# Patient Record
Sex: Female | Born: 1970 | Race: Black or African American | Hispanic: No | Marital: Single | Smoking: Never smoker
Health system: Southern US, Community
[De-identification: ages and names within clinical notes are randomized; demographics above are authoritative.]

## PROBLEM LIST (undated history)

## (undated) ENCOUNTER — Ambulatory Visit (HOSPITAL_COMMUNITY): Admission: EM | Payer: 59 | Source: Home / Self Care

## (undated) DIAGNOSIS — G8929 Other chronic pain: Secondary | ICD-10-CM

## (undated) DIAGNOSIS — M47812 Spondylosis without myelopathy or radiculopathy, cervical region: Secondary | ICD-10-CM

## (undated) DIAGNOSIS — H9192 Unspecified hearing loss, left ear: Secondary | ICD-10-CM

## (undated) DIAGNOSIS — L709 Acne, unspecified: Secondary | ICD-10-CM

## (undated) DIAGNOSIS — E785 Hyperlipidemia, unspecified: Secondary | ICD-10-CM

## (undated) DIAGNOSIS — Z0271 Encounter for disability determination: Secondary | ICD-10-CM

## (undated) DIAGNOSIS — R51 Headache: Secondary | ICD-10-CM

## (undated) DIAGNOSIS — M542 Cervicalgia: Secondary | ICD-10-CM

## (undated) DIAGNOSIS — K1121 Acute sialoadenitis: Secondary | ICD-10-CM

## (undated) DIAGNOSIS — I10 Essential (primary) hypertension: Secondary | ICD-10-CM

## (undated) DIAGNOSIS — M5030 Other cervical disc degeneration, unspecified cervical region: Secondary | ICD-10-CM

## (undated) DIAGNOSIS — I82409 Acute embolism and thrombosis of unspecified deep veins of unspecified lower extremity: Secondary | ICD-10-CM

## (undated) DIAGNOSIS — M2669 Other specified disorders of temporomandibular joint: Secondary | ICD-10-CM

## (undated) DIAGNOSIS — M1990 Unspecified osteoarthritis, unspecified site: Secondary | ICD-10-CM

## (undated) DIAGNOSIS — M47816 Spondylosis without myelopathy or radiculopathy, lumbar region: Secondary | ICD-10-CM

## (undated) DIAGNOSIS — F329 Major depressive disorder, single episode, unspecified: Secondary | ICD-10-CM

## (undated) DIAGNOSIS — T7840XA Allergy, unspecified, initial encounter: Secondary | ICD-10-CM

## (undated) DIAGNOSIS — R112 Nausea with vomiting, unspecified: Secondary | ICD-10-CM

## (undated) DIAGNOSIS — M5412 Radiculopathy, cervical region: Secondary | ICD-10-CM

## (undated) DIAGNOSIS — E039 Hypothyroidism, unspecified: Secondary | ICD-10-CM

## (undated) DIAGNOSIS — M5136 Other intervertebral disc degeneration, lumbar region: Secondary | ICD-10-CM

## (undated) DIAGNOSIS — M899 Disorder of bone, unspecified: Secondary | ICD-10-CM

## (undated) DIAGNOSIS — M961 Postlaminectomy syndrome, not elsewhere classified: Secondary | ICD-10-CM

## (undated) DIAGNOSIS — F419 Anxiety disorder, unspecified: Secondary | ICD-10-CM

## (undated) DIAGNOSIS — Z9889 Other specified postprocedural states: Secondary | ICD-10-CM

## (undated) DIAGNOSIS — M5416 Radiculopathy, lumbar region: Secondary | ICD-10-CM

## (undated) DIAGNOSIS — R519 Headache, unspecified: Secondary | ICD-10-CM

## (undated) HISTORY — DX: Spondylosis without myelopathy or radiculopathy, cervical region: M47.812

## (undated) HISTORY — DX: Unspecified hearing loss, left ear: H91.92

## (undated) HISTORY — DX: Disorder of bone, unspecified: M89.9

## (undated) HISTORY — DX: Allergy, unspecified, initial encounter: T78.40XA

## (undated) HISTORY — DX: Postlaminectomy syndrome, not elsewhere classified: M96.1

## (undated) HISTORY — DX: Cervicalgia: M54.2

## (undated) HISTORY — DX: Other intervertebral disc degeneration, lumbar region: M51.36

## (undated) HISTORY — DX: Anxiety disorder, unspecified: F41.9

## (undated) HISTORY — DX: Encounter for disability determination: Z02.71

## (undated) HISTORY — DX: Acne, unspecified: L70.9

## (undated) HISTORY — DX: Other cervical disc degeneration, unspecified cervical region: M50.30

## (undated) HISTORY — DX: Acute embolism and thrombosis of unspecified deep veins of unspecified lower extremity: I82.409

## (undated) HISTORY — DX: Headache: R51

## (undated) HISTORY — PX: BACK SURGERY: SHX140

## (undated) HISTORY — PX: OTHER SURGICAL HISTORY: SHX169

## (undated) HISTORY — DX: Hyperlipidemia, unspecified: E78.5

## (undated) HISTORY — DX: Headache, unspecified: R51.9

## (undated) HISTORY — DX: Other chronic pain: G89.29

## (undated) HISTORY — DX: Other specified disorders of temporomandibular joint: M26.69

## (undated) HISTORY — DX: Radiculopathy, lumbar region: M54.16

## (undated) HISTORY — DX: Spondylosis without myelopathy or radiculopathy, lumbar region: M47.816

## (undated) HISTORY — DX: Radiculopathy, cervical region: M54.12

## (undated) HISTORY — DX: Acute sialoadenitis: K11.21

---

## 2000-12-17 ENCOUNTER — Encounter: Payer: Self-pay | Admitting: Emergency Medicine

## 2000-12-17 ENCOUNTER — Inpatient Hospital Stay (HOSPITAL_COMMUNITY): Admission: AD | Admit: 2000-12-17 | Discharge: 2000-12-17 | Payer: Self-pay | Admitting: Gynecology

## 2000-12-28 ENCOUNTER — Inpatient Hospital Stay (HOSPITAL_COMMUNITY): Admission: AD | Admit: 2000-12-28 | Discharge: 2000-12-28 | Payer: Self-pay | Admitting: Obstetrics & Gynecology

## 2001-01-12 ENCOUNTER — Encounter: Admission: RE | Admit: 2001-01-12 | Discharge: 2001-01-12 | Payer: Self-pay | Admitting: Family Medicine

## 2001-01-26 ENCOUNTER — Inpatient Hospital Stay (HOSPITAL_COMMUNITY): Admission: AD | Admit: 2001-01-26 | Discharge: 2001-01-26 | Payer: Self-pay | Admitting: Obstetrics & Gynecology

## 2001-01-31 ENCOUNTER — Other Ambulatory Visit: Admission: RE | Admit: 2001-01-31 | Discharge: 2001-01-31 | Payer: Self-pay | Admitting: Family Medicine

## 2001-01-31 ENCOUNTER — Encounter: Admission: RE | Admit: 2001-01-31 | Discharge: 2001-01-31 | Payer: Self-pay | Admitting: Family Medicine

## 2001-02-08 ENCOUNTER — Ambulatory Visit (HOSPITAL_COMMUNITY): Admission: RE | Admit: 2001-02-08 | Discharge: 2001-02-22 | Payer: Self-pay | Admitting: Obstetrics

## 2001-03-05 ENCOUNTER — Encounter: Admission: RE | Admit: 2001-03-05 | Discharge: 2001-03-05 | Payer: Self-pay | Admitting: Family Medicine

## 2001-03-28 ENCOUNTER — Encounter: Admission: RE | Admit: 2001-03-28 | Discharge: 2001-03-28 | Payer: Self-pay | Admitting: Family Medicine

## 2001-04-16 ENCOUNTER — Encounter: Admission: RE | Admit: 2001-04-16 | Discharge: 2001-04-16 | Payer: Self-pay | Admitting: Family Medicine

## 2001-05-10 ENCOUNTER — Encounter: Admission: RE | Admit: 2001-05-10 | Discharge: 2001-05-10 | Payer: Self-pay | Admitting: Family Medicine

## 2001-05-15 ENCOUNTER — Ambulatory Visit (HOSPITAL_COMMUNITY): Admission: RE | Admit: 2001-05-15 | Discharge: 2001-05-18 | Payer: Self-pay | Admitting: Family Medicine

## 2001-05-18 ENCOUNTER — Encounter: Payer: Self-pay | Admitting: Family Medicine

## 2001-05-28 ENCOUNTER — Encounter: Admission: RE | Admit: 2001-05-28 | Discharge: 2001-05-28 | Payer: Self-pay | Admitting: Family Medicine

## 2001-06-15 ENCOUNTER — Encounter: Admission: RE | Admit: 2001-06-15 | Discharge: 2001-06-15 | Payer: Self-pay | Admitting: Family Medicine

## 2001-06-29 ENCOUNTER — Encounter: Admission: RE | Admit: 2001-06-29 | Discharge: 2001-06-29 | Payer: Self-pay | Admitting: Family Medicine

## 2001-07-13 ENCOUNTER — Encounter: Admission: RE | Admit: 2001-07-13 | Discharge: 2001-07-13 | Payer: Self-pay | Admitting: Family Medicine

## 2001-07-18 ENCOUNTER — Encounter: Admission: RE | Admit: 2001-07-18 | Discharge: 2001-07-18 | Payer: Self-pay | Admitting: Family Medicine

## 2001-07-23 ENCOUNTER — Observation Stay (HOSPITAL_COMMUNITY): Admission: AD | Admit: 2001-07-23 | Discharge: 2001-07-24 | Payer: Self-pay | Admitting: Obstetrics & Gynecology

## 2001-07-24 ENCOUNTER — Encounter: Payer: Self-pay | Admitting: Obstetrics & Gynecology

## 2001-07-27 ENCOUNTER — Encounter: Admission: RE | Admit: 2001-07-27 | Discharge: 2001-07-27 | Payer: Self-pay | Admitting: Family Medicine

## 2001-07-27 ENCOUNTER — Encounter (HOSPITAL_COMMUNITY): Admission: RE | Admit: 2001-07-27 | Discharge: 2001-07-27 | Payer: Self-pay | Admitting: Obstetrics & Gynecology

## 2001-07-31 ENCOUNTER — Inpatient Hospital Stay (HOSPITAL_COMMUNITY): Admission: AD | Admit: 2001-07-31 | Discharge: 2001-08-03 | Payer: Self-pay | Admitting: *Deleted

## 2001-08-14 ENCOUNTER — Encounter: Admission: RE | Admit: 2001-08-14 | Discharge: 2001-08-14 | Payer: Self-pay | Admitting: Family Medicine

## 2001-08-21 ENCOUNTER — Encounter: Admission: RE | Admit: 2001-08-21 | Discharge: 2001-08-21 | Payer: Self-pay | Admitting: Family Medicine

## 2001-09-21 ENCOUNTER — Encounter: Admission: RE | Admit: 2001-09-21 | Discharge: 2001-09-21 | Payer: Self-pay | Admitting: Family Medicine

## 2001-10-03 ENCOUNTER — Encounter: Admission: RE | Admit: 2001-10-03 | Discharge: 2001-10-03 | Payer: Self-pay | Admitting: Family Medicine

## 2002-04-27 ENCOUNTER — Emergency Department (HOSPITAL_COMMUNITY): Admission: EM | Admit: 2002-04-27 | Discharge: 2002-04-27 | Payer: Self-pay | Admitting: Emergency Medicine

## 2002-09-20 ENCOUNTER — Encounter: Admission: RE | Admit: 2002-09-20 | Discharge: 2002-09-20 | Payer: Self-pay | Admitting: Family Medicine

## 2003-01-08 ENCOUNTER — Emergency Department (HOSPITAL_COMMUNITY): Admission: EM | Admit: 2003-01-08 | Discharge: 2003-01-08 | Payer: Self-pay | Admitting: Emergency Medicine

## 2003-11-09 ENCOUNTER — Emergency Department (HOSPITAL_COMMUNITY): Admission: EM | Admit: 2003-11-09 | Discharge: 2003-11-09 | Payer: Self-pay | Admitting: Emergency Medicine

## 2004-02-02 ENCOUNTER — Emergency Department (HOSPITAL_COMMUNITY): Admission: EM | Admit: 2004-02-02 | Discharge: 2004-02-02 | Payer: Self-pay | Admitting: Family Medicine

## 2004-03-31 ENCOUNTER — Other Ambulatory Visit: Admission: RE | Admit: 2004-03-31 | Discharge: 2004-03-31 | Payer: Self-pay | Admitting: Family Medicine

## 2004-03-31 ENCOUNTER — Encounter (INDEPENDENT_AMBULATORY_CARE_PROVIDER_SITE_OTHER): Payer: Self-pay | Admitting: *Deleted

## 2004-03-31 ENCOUNTER — Encounter: Admission: RE | Admit: 2004-03-31 | Discharge: 2004-03-31 | Payer: Self-pay | Admitting: Family Medicine

## 2004-04-02 ENCOUNTER — Encounter: Admission: RE | Admit: 2004-04-02 | Discharge: 2004-04-02 | Payer: Self-pay | Admitting: Family Medicine

## 2004-05-04 ENCOUNTER — Emergency Department (HOSPITAL_COMMUNITY): Admission: EM | Admit: 2004-05-04 | Discharge: 2004-05-04 | Payer: Self-pay | Admitting: Family Medicine

## 2004-09-07 ENCOUNTER — Ambulatory Visit: Payer: Self-pay | Admitting: Family Medicine

## 2005-05-30 ENCOUNTER — Ambulatory Visit: Payer: Self-pay | Admitting: Sports Medicine

## 2005-05-30 ENCOUNTER — Encounter (INDEPENDENT_AMBULATORY_CARE_PROVIDER_SITE_OTHER): Payer: Self-pay | Admitting: Specialist

## 2005-05-30 ENCOUNTER — Other Ambulatory Visit: Admission: RE | Admit: 2005-05-30 | Discharge: 2005-05-30 | Payer: Self-pay | Admitting: Family Medicine

## 2006-01-19 ENCOUNTER — Ambulatory Visit: Payer: Self-pay | Admitting: Family Medicine

## 2006-08-10 ENCOUNTER — Emergency Department (HOSPITAL_COMMUNITY): Admission: EM | Admit: 2006-08-10 | Discharge: 2006-08-10 | Payer: Self-pay | Admitting: Emergency Medicine

## 2006-09-04 ENCOUNTER — Emergency Department (HOSPITAL_COMMUNITY): Admission: EM | Admit: 2006-09-04 | Discharge: 2006-09-04 | Payer: Self-pay | Admitting: Emergency Medicine

## 2006-10-12 ENCOUNTER — Encounter (INDEPENDENT_AMBULATORY_CARE_PROVIDER_SITE_OTHER): Payer: Self-pay | Admitting: *Deleted

## 2006-10-31 ENCOUNTER — Other Ambulatory Visit: Admission: RE | Admit: 2006-10-31 | Discharge: 2006-10-31 | Payer: Self-pay | Admitting: Family Medicine

## 2006-10-31 ENCOUNTER — Ambulatory Visit: Payer: Self-pay | Admitting: Family Medicine

## 2007-02-09 ENCOUNTER — Encounter (INDEPENDENT_AMBULATORY_CARE_PROVIDER_SITE_OTHER): Payer: Self-pay | Admitting: *Deleted

## 2007-02-19 ENCOUNTER — Telehealth (INDEPENDENT_AMBULATORY_CARE_PROVIDER_SITE_OTHER): Payer: Self-pay | Admitting: *Deleted

## 2007-03-07 ENCOUNTER — Ambulatory Visit: Payer: Self-pay | Admitting: Family Medicine

## 2007-04-16 ENCOUNTER — Emergency Department (HOSPITAL_COMMUNITY): Admission: EM | Admit: 2007-04-16 | Discharge: 2007-04-16 | Payer: Self-pay | Admitting: Emergency Medicine

## 2007-08-16 ENCOUNTER — Telehealth (INDEPENDENT_AMBULATORY_CARE_PROVIDER_SITE_OTHER): Payer: Self-pay | Admitting: *Deleted

## 2007-09-02 ENCOUNTER — Emergency Department (HOSPITAL_COMMUNITY): Admission: EM | Admit: 2007-09-02 | Discharge: 2007-09-02 | Payer: Self-pay | Admitting: Family Medicine

## 2007-09-18 ENCOUNTER — Inpatient Hospital Stay (HOSPITAL_COMMUNITY): Admission: AD | Admit: 2007-09-18 | Discharge: 2007-09-18 | Payer: Self-pay | Admitting: Obstetrics & Gynecology

## 2007-11-22 ENCOUNTER — Ambulatory Visit: Payer: Self-pay | Admitting: Family Medicine

## 2007-11-22 ENCOUNTER — Encounter (INDEPENDENT_AMBULATORY_CARE_PROVIDER_SITE_OTHER): Payer: Self-pay | Admitting: Family Medicine

## 2007-11-22 DIAGNOSIS — I10 Essential (primary) hypertension: Secondary | ICD-10-CM

## 2007-11-22 LAB — CONVERTED CEMR LAB
Creatinine, Ser: 0.66 mg/dL (ref 0.40–1.20)
GC Probe Amp, Genital: NEGATIVE
Glucose, Bld: 73 mg/dL (ref 70–99)
Potassium: 4 meq/L (ref 3.5–5.3)
Sodium: 138 meq/L (ref 135–145)
TSH: 2.976 microintl units/mL (ref 0.350–5.50)

## 2007-11-30 ENCOUNTER — Encounter: Payer: Self-pay | Admitting: *Deleted

## 2007-11-30 ENCOUNTER — Emergency Department (HOSPITAL_COMMUNITY): Admission: EM | Admit: 2007-11-30 | Discharge: 2007-11-30 | Payer: Self-pay | Admitting: Emergency Medicine

## 2007-12-03 ENCOUNTER — Ambulatory Visit: Payer: Self-pay | Admitting: Sports Medicine

## 2007-12-03 ENCOUNTER — Telehealth: Payer: Self-pay | Admitting: *Deleted

## 2007-12-31 ENCOUNTER — Telehealth: Payer: Self-pay | Admitting: Family Medicine

## 2008-02-15 ENCOUNTER — Ambulatory Visit: Payer: Self-pay | Admitting: Family Medicine

## 2008-02-15 ENCOUNTER — Telehealth: Payer: Self-pay | Admitting: *Deleted

## 2008-02-22 ENCOUNTER — Telehealth (INDEPENDENT_AMBULATORY_CARE_PROVIDER_SITE_OTHER): Payer: Self-pay | Admitting: *Deleted

## 2008-03-26 ENCOUNTER — Telehealth: Payer: Self-pay | Admitting: *Deleted

## 2008-03-26 ENCOUNTER — Ambulatory Visit: Payer: Self-pay | Admitting: Family Medicine

## 2008-11-12 ENCOUNTER — Telehealth: Payer: Self-pay | Admitting: *Deleted

## 2008-11-12 ENCOUNTER — Ambulatory Visit: Payer: Self-pay | Admitting: Family Medicine

## 2008-11-14 ENCOUNTER — Encounter (INDEPENDENT_AMBULATORY_CARE_PROVIDER_SITE_OTHER): Payer: Self-pay | Admitting: *Deleted

## 2009-01-21 ENCOUNTER — Emergency Department (HOSPITAL_COMMUNITY): Admission: EM | Admit: 2009-01-21 | Discharge: 2009-01-21 | Payer: Self-pay | Admitting: Emergency Medicine

## 2009-02-10 ENCOUNTER — Ambulatory Visit: Payer: Self-pay | Admitting: Family Medicine

## 2009-03-25 ENCOUNTER — Emergency Department (HOSPITAL_COMMUNITY): Admission: EM | Admit: 2009-03-25 | Discharge: 2009-03-25 | Payer: Self-pay | Admitting: Emergency Medicine

## 2009-04-10 ENCOUNTER — Ambulatory Visit: Payer: Self-pay | Admitting: Family Medicine

## 2009-07-17 ENCOUNTER — Ambulatory Visit: Payer: Self-pay | Admitting: Family Medicine

## 2009-07-17 LAB — CONVERTED CEMR LAB
Blood in Urine, dipstick: NEGATIVE
Nitrite: NEGATIVE
Specific Gravity, Urine: 1.015
Urobilinogen, UA: 0.2
Whiff Test: POSITIVE

## 2009-08-11 ENCOUNTER — Ambulatory Visit: Payer: Self-pay | Admitting: Family Medicine

## 2009-08-13 ENCOUNTER — Ambulatory Visit: Payer: Self-pay | Admitting: Family Medicine

## 2009-11-15 ENCOUNTER — Emergency Department (HOSPITAL_COMMUNITY): Admission: EM | Admit: 2009-11-15 | Discharge: 2009-11-15 | Payer: Self-pay | Admitting: Emergency Medicine

## 2010-01-22 ENCOUNTER — Ambulatory Visit: Payer: Self-pay | Admitting: Family Medicine

## 2010-01-22 ENCOUNTER — Encounter: Payer: Self-pay | Admitting: Family Medicine

## 2010-01-22 ENCOUNTER — Other Ambulatory Visit: Admission: RE | Admit: 2010-01-22 | Discharge: 2010-01-22 | Payer: Self-pay | Admitting: Family Medicine

## 2010-01-25 ENCOUNTER — Encounter: Payer: Self-pay | Admitting: Family Medicine

## 2010-01-25 ENCOUNTER — Ambulatory Visit: Payer: Self-pay | Admitting: Family Medicine

## 2010-01-25 LAB — CONVERTED CEMR LAB
BUN: 14 mg/dL (ref 6–23)
Calcium: 9.6 mg/dL (ref 8.4–10.5)
Chloride: 105 meq/L (ref 96–112)
Creatinine, Ser: 0.73 mg/dL (ref 0.40–1.20)
Sodium: 137 meq/L (ref 135–145)

## 2010-01-26 LAB — CONVERTED CEMR LAB
Chlamydia, DNA Probe: NEGATIVE
GC Probe Amp, Genital: NEGATIVE

## 2010-01-27 ENCOUNTER — Encounter: Payer: Self-pay | Admitting: *Deleted

## 2010-01-27 LAB — CONVERTED CEMR LAB: Pap Smear: NEGATIVE

## 2010-06-23 ENCOUNTER — Telehealth: Payer: Self-pay | Admitting: Family Medicine

## 2010-06-23 ENCOUNTER — Ambulatory Visit: Payer: Self-pay | Admitting: Family Medicine

## 2010-06-23 ENCOUNTER — Encounter: Payer: Self-pay | Admitting: Family Medicine

## 2010-06-23 LAB — CONVERTED CEMR LAB: Chlamydia, Swab/Urine, PCR: NEGATIVE

## 2010-06-24 ENCOUNTER — Encounter: Payer: Self-pay | Admitting: Family Medicine

## 2010-06-28 ENCOUNTER — Telehealth: Payer: Self-pay | Admitting: *Deleted

## 2010-07-26 ENCOUNTER — Telehealth: Payer: Self-pay | Admitting: Family Medicine

## 2010-07-27 ENCOUNTER — Ambulatory Visit: Payer: Self-pay | Admitting: Family Medicine

## 2010-07-27 DIAGNOSIS — H9209 Otalgia, unspecified ear: Secondary | ICD-10-CM | POA: Insufficient documentation

## 2010-09-01 ENCOUNTER — Emergency Department (HOSPITAL_COMMUNITY): Admission: EM | Admit: 2010-09-01 | Discharge: 2010-09-01 | Payer: Self-pay | Admitting: Emergency Medicine

## 2010-09-03 ENCOUNTER — Ambulatory Visit: Payer: Self-pay | Admitting: Family Medicine

## 2010-09-29 ENCOUNTER — Telehealth: Payer: Self-pay | Admitting: Family Medicine

## 2010-09-30 ENCOUNTER — Encounter: Payer: Self-pay | Admitting: Family Medicine

## 2010-09-30 ENCOUNTER — Ambulatory Visit: Payer: Self-pay | Admitting: Family Medicine

## 2010-09-30 DIAGNOSIS — N760 Acute vaginitis: Secondary | ICD-10-CM

## 2010-09-30 LAB — CONVERTED CEMR LAB
Chlamydia, DNA Probe: NEGATIVE
GC Probe Amp, Genital: NEGATIVE

## 2010-10-05 ENCOUNTER — Encounter: Payer: Self-pay | Admitting: Family Medicine

## 2010-10-06 ENCOUNTER — Telehealth (INDEPENDENT_AMBULATORY_CARE_PROVIDER_SITE_OTHER): Payer: Self-pay | Admitting: Family Medicine

## 2011-01-02 ENCOUNTER — Encounter: Payer: Self-pay | Admitting: Family Medicine

## 2011-01-11 NOTE — Miscellaneous (Signed)
Summary: went to UC-sent back   Clinical Lists Changes rec'd call from UC. she presented there for c/o UTI. I asked them to send her over here as her pcp can see her now.Golden Circle RN  June 23, 2010 9:46 AM   sounds good, thanks. Jamie Brookes MD  June 23, 2010 10:18 AM

## 2011-01-11 NOTE — Assessment & Plan Note (Signed)
Summary: ear pain x 1 wk/Canute/strother   Vital Signs:  Patient profile:   40 year old female Height:      65 inches Weight:      166 pounds BMI:     27.72 Temp:     98.3 degrees F oral Pulse rate:   73 / minute BP sitting:   150 / 101  (left arm) Cuff size:   regular  Vitals Entered By: Tessie Fass CMA (July 27, 2010 9:24 AM) CC: bilateral ear ache x 1 week   Primary Care [facility] [place]:  Jamie Brookes MD  CC:  bilateral ear ache x 1 week.  History of Present Illness: 40 yo female with hx mult ear infections as a child with b/l ear pain x 1 week.  Described as "arthritis in ears."  Took some OTC meds and felt a little better but worse again.  Here to see whats going on.  No drainage, no change in hearing (60% hearing loss in the past from infections).  No N/V/D/C, no fevers/chills, no body aches/myalgias, watery eyes, sneezing, pressure behind maxillary sinuses.  Mild ST.  no SOB/wheeze.  Current Medications (verified): 1)  Flexeril 5 Mg  Tabs (Cyclobenzaprine Hcl) .[name].. 1 Tab By Mouth Three Times A Day As Needed For Muscle Spasms - Do Not Drive While On This Medication 2)  Metronidazole 500 Mg Tabs (Metronidazole) .... Take All 4 Tabs At One Time. Do Not Have Intercourse For 1 Week After You and Your Partner Are Treated. 3)  Keflex 500 Mg Caps (Cephalexin) .... Take 1 Pill By Mouth Two Times A Day X 10 Days. 4)  Tylenol Cold Head Congestion 10-5-325 Mg Tabs (Dm-Phenylephrine-Acetaminophen) .... One To Two Tabs By Mouth Q6h As Needed For Symptoms.  Allergies (verified): No Known Drug Allergies  Review of Systems       See HPI  Physical Exam  General:  Well-developed,well-nourished,in no acute distress; alert,appropriate and cooperative throughout examination Head:  Normocephalic and atraumatic without obvious abnormalities.  Eyes:  No corneal or conjunctival inflammation noted. EOMI. Perrla.  Ears:  External ear exam shows no significant lesions or deformities.  Otoscopic  examination reveals clear canals, tympanic membranes are intact bilaterally without bulging, retraction, inflammation or discharge. Hearing is grossly normal bilaterally. Nose:  External nasal examination shows no deformity or inflammation. Nasal mucosa are pink and moist without lesions or exudates. Mouth:  Pharynx erythematous, no exudates.   Neck:  No deformities, masses, or tenderness noted. Lungs:  Normal respiratory effort, chest expands symmetrically. Lungs are clear to auscultation, no crackles or wheezes. Heart:  Normal rate and regular rhythm. S1 and S2 normal without gallop, murmur, click, rub or other extra sounds.   Impression & Recommendations:  Problem # 1:  EAR PAIN, REFERRED (ICD-388.72) Assessment New Acute pharyngitis, referred pain to ears.  Recommend symptomatic treatment.  Handout given.  Rx. APAP/DM/Phenylephrine short course.  RTC if no better in 2 weeks.  Orders: [facility]- Est Level  3 (46962)  Complete Medication List: 1)  Flexeril 5 Mg Tabs (Cyclobenzaprine hcl) .[name].. 1 tab by mouth three times a day as needed for muscle spasms - do not drive while on this medication 2)  Metronidazole 500 Mg Tabs (Metronidazole) .... Take all 4 tabs at one time. do not have intercourse for 1 week after you and your partner are treated. 3)  Keflex 500 Mg Caps (Cephalexin) .... Take 1 pill by mouth two times a day x 10 days. 4)  Tylenol Cold Head Congestion  10-5-325 Mg Tabs (Dm-phenylephrine-acetaminophen) .... One to two tabs by mouth q6h as needed for symptoms. Prescriptions: TYLENOL COLD HEAD CONGESTION 10-5-325 MG TABS (DM-PHENYLEPHRINE-ACETAMINOPHEN) One to two tabs by mouth q6h as needed for symptoms.  #1 bottle x 0   Entered and Authorized by:   Rodney Langton MD   Signed by:   Rodney Langton MD on 07/27/2010   Method used:   Print then Give to Patient   RxID:   [id number]

## 2011-01-11 NOTE — Assessment & Plan Note (Signed)
Summary: grief reaction, left arm numbness, STI check and PAP smear.    Vital Signs:  Patient profile:   40 year old female Weight:      161.1 pounds BMI:     26.91 Temp:     99 degrees F oral Pulse rate:   73 / minute BP sitting:   136 / 85  (right arm) Cuff size:   regular  Vitals Entered By: Loralee Pacas CMA (January 22, 2010 2:38 PM)  Primary Care Provider:  Jamie Brookes MD  CC:  Greiving, left arm numbness, exposure to STI's, and PAP smear. [name]  History of Present Illness: 40 y/o F who just lost her mother to breast Ca with mets. She is greiving. Her mother passed away on 2022/12/14. She appears to be dealing with the loss and has a sister who lives in the area. She has a very strong belief in God and is reading her bible and praying a lot. 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   Left arm numbness and tingling: Pt has had this before. See for this on 02-15-08. She had a CT scan that was neg. She now has the numbness again. She has no weakness or other left sided symptoms. She has no facial drooping or headahces.   STI's: Pt says that she was in a relationship about 6 months ago that was not good for her and now that her mother is gone she wants to focus on her health. She wishes to be tested for all STI's.   Pap smear, PT has not had a pap since 11/2007. Is this one is normal she will only have to have 1 more and then she can space out to every 3 years.    Current Medications (verified): 1)  Flexeril 5 Mg  Tabs (Cyclobenzaprine Hcl) .[name].. 1 Tab By Mouth Three Times A Day As Needed For Muscle Spasms - Do Not Drive While On This Medication  Allergies: No Known Drug Allergies  Social History: lives with 3 children (15, 10, 2); no tobacco, social ETOH; Stopped using chronic marijuana,  total of 15 sexual partners; no current exercise; family (sisters) in the area, Former Smoker  Review of Systems   vitals reviewed and pertinent negatives and positives seen in HPI   Physical Exam  General:  Well-developed,well-nourished,in no acute distress; alert,appropriate and cooperative throughout examination Ears:  External ear exam shows no significant lesions or deformities.  Otoscopic examination reveals clear canals, tympanic membranes are intact bilaterally without bulging, retraction, inflammation or discharge. Hearing is grossly normal bilaterally. Breasts:  No mass, nodules, thickening, tenderness, bulging, retraction, inflamation, nipple discharge or skin changes noted.   Lungs:  Normal respiratory effort, chest expands symmetrically. Lungs are clear to auscultation, no crackles or wheezes. Heart:  Normal rate and regular rhythm. S1 and S2 normal without gallop, murmur, click, rub or other extra sounds. Abdomen:  Bowel sounds positive,abdomen soft and non-tender without masses, organomegaly or hernias noted. Genitalia:  Normal introitus for age, no external lesions, no vaginal discharge, mucosa pink and moist, no vaginal or cervical lesions, no vaginal atrophy, no friaility or hemorrhage, normal uterus size and position, no adnexal masses or tenderness Psych:  Cognition and judgment appear intact. Alert and cooperative with normal attention span and concentration. No apparent delusions, illusions, hallucinations   Impression & Recommendations:  Problem # 1:  GRIEF REACTION (ICD-309.0) Assessment New Pt's  mother died 1 month ago and she is grieving appropriately. Discussed having her call our office if she needs help or has any problems with depression or sleep.   Orders: [facility]- Est  Level 4 (99214)  Problem # 2:  DISTURBANCE OF SKIN SENSATION (ICD-782.0) Assessment: Deteriorated Pt is having left arm numbness which she had had before. She has been treated with Flexerill and the numbness has subsided. She never went to get the MRI the last time it flared up but did have a CT scan which was  neg. Will try Flexerill again and if she has continued pain will send her for MRI.   Orders: [facility]- Est  Level 4 (99214)  Problem # 3:  SEXUALLY TRANSMITTED DISEASE, EXPOSURE TO (ICD-V01.6) Assessment: Unchanged Pt has had >15 sexual partners and wants to be tested today. She is going to take care of herself now that her mom has died.   Orders: GC/Chlamydia-[facility] ([id number]) HIV-[facility] [phone]) Wet Prep- [facility] [phone]) [facility]- Est  Level 4 (99214)Future Orders: HIV-[facility] ([id number]) ... 01/19/2011 RPR-[facility] [phone]) ... 01/27/2010  Problem # 4:  SCREENING FOR MALIGNANT NEOPLASM OF THE CERVIX (ICD-V76.2) Assessment: Comment Only Pt had pap smear today.   Complete Medication List: 1)  Flexeril 5 Mg Tabs (Cyclobenzaprine hcl) .[name].. 1 tab by mouth three times a day as needed for muscle spasms - do not drive while on this medication  Other Orders: Pap Smear-[facility] ([id number]) Basic Met-[facility] [phone]) Future Orders: Basic Met-[facility] ([id number]) ... 01/27/2011  Patient Instructions: 1)  Come back on Monday fasting and get your blood test. 2)  Please ask them to do recheck your BP while you are here on Monday.  3)  I'm so sorry to hear about your loss. I loved your mom too.  4)  1 Thess 4:17, your mom rests in her grave now, but one day when Jesus comes again in the second coming we will all see him, she will be raised to life again and be caught up in the air to see her Maker.  Prescriptions: FLEXERIL 5 MG  TABS (CYCLOBENZAPRINE HCL) 1 tab by mouth three times a day as needed for muscle spasms - do not drive while on this medication  #30 x 1   Entered and Authorized by:   Jamie Brookes MD   Signed by:   Jamie Brookes MD on 01/22/2010   Method used:   [facility]        [facility] [phone]* (retail)       [address]       [place], [place]  [id number]       Ph: [phone] or [phone]       Fax: [phone]   RxID:    [id number]   Laboratory Results  Date/Time Received: January 22, 2010 2:46 PM   [employer] Source: vaginal WBC/hpf: 1-5 Bacteria/hpf: 3+  Cocci Clue cells/hpf: few  Positive whiff Yeast/hpf: none Trichomonas/hpf: none Comments: 0-3 RBC's present ...........test performed by...........Marland KitchenTerese Door, CMA

## 2011-01-11 NOTE — Assessment & Plan Note (Signed)
Summary: viral URI f/u from ED visit   Vital Signs:  Patient profile:   40 year old female Weight:      172 pounds Temp:     99.4 degrees F oral Pulse rate:   80 / minute Pulse rhythm:   regular BP sitting:   127 / 81  (left arm) Cuff size:   regular  Vitals Entered By: Loralee Pacas CMA (September 03, 2010 9:19 AM) CC: ED follow up   Primary Care Provider:  Jamie Brookes MD  CC:  ED follow up.  History of Present Illness: Viral URI: PT was seen on 09-01-10 for a viral URI. She had been having some shortness of breath the days before that and was seen in the ED. Her CXR was WNL and she had no other signs of an infection. She used an inhaler once but it didn't help. She was not found to be SOB at the ED. SHe has been breathing better since her ED appt. She says she thinks it might be allergies.  Of note, her daughter was sick with congestion at home. C/o no fever or chills.  Habits & Providers  Alcohol-Tobacco-Diet     Tobacco Status: quit  Current Medications (verified): 1)  Flexeril 5 Mg  Tabs (Cyclobenzaprine Hcl) .[name].. 1 Tab By Mouth Three Times A Day As Needed For Muscle Spasms - Do Not Drive While On This Medication 2)  Cetirizine Hcl 10 Mg Tabs (Cetirizine Hcl) .... Take 1 Pill Every Day. Take At Night If It Makes To Drowsy.  Allergies: No Known Drug Allergies  Social History: lives with 3 children (15, 10, 2); no tobacco, social ETOH; Stopped using chronic marijuana,  total of 15 sexual partners; no current exercise; family (sisters) in the area, Former Smoker, sophomore in school.   Review of Systems        vitals reviewed and pertinent negatives and positives seen in HPI   Physical Exam  General:  Well-developed,well-nourished,in no acute distress; alert,appropriate and cooperative throughout examination Head:  Normocephalic and atraumatic without obvious abnormalities. No apparent alopecia or balding. Ears:  External ear exam shows no significant lesions or  deformities.  Otoscopic examination reveals clear canals, tympanic membranes are intact bilaterally without bulging, retraction, inflammation or discharge. Hearing is grossly normal bilaterally. Nose:  External nasal examination shows no deformity or inflammation. Nasal mucosa are pink and moist without lesions or exudates. Mouth:  Oral mucosa and oropharynx without lesions or exudates.  Teeth in good repair. Neck:  No deformities, masses, or tenderness noted. Lungs:  Normal respiratory effort, chest expands symmetrically. Lungs are clear to auscultation, no crackles or wheezes. Heart:  Normal rate and regular rhythm. S1 and S2 normal without gallop, murmur, click, rub or other extra sounds.   Impression & Recommendations:  Problem # 1:  VIRAL URI (ICD-465.9) Assessment New Pt has a viral URI. CXR in ED was normal. Pt is breathing better. However, plan to start an allergy med today because it may help with her congestion. Pt knows that it may resolve slowly.   The following medications were removed from the medication list:    Tylenol Cold Head Congestion 10-5-325 Mg Tabs (Dm-phenylephrine-acetaminophen) ..... One to two tabs by mouth q6h as needed for symptoms. Her updated medication list for this problem includes:    Cetirizine Hcl 10 Mg Tabs (Cetirizine hcl) .[name]... Take 1 pill every day. take at night if it makes to drowsy.  Orders: [facility]- Est Level  3 ([address])  Complete  Medication List: 1)  Flexeril 5 Mg Tabs (Cyclobenzaprine hcl) .[name].. 1 tab by mouth three times a day as needed for muscle spasms - do not drive while on this medication 2)  Cetirizine Hcl 10 Mg Tabs (Cetirizine hcl) .... Take 1 pill every day. take at night if it makes to drowsy. Prescriptions: CETIRIZINE HCL 10 MG TABS (CETIRIZINE HCL) take 1 pill every day. Take at night if it makes to drowsy.  #90 x 3   Entered and Authorized by:   Jamie Brookes MD   Signed by:   Jamie Brookes MD on 09/03/2010   Method used:    [facility]        [facility] [phone][facility] (retail)       [address]       [place], [place]  [id number]       Ph: [phone] or [phone]       Fax: [phone]   RxID:   [id number]

## 2011-01-11 NOTE — Progress Notes (Signed)
   Phone Note Outgoing Call   Call placed by: Jimmy Footman, CMA,  June 28, 2010 10:26 AM Summary of Call: Called pt and informed of high bacteria in urine. [facility] [address] pt understands .Marland KitchenJimmy Footman, CMA  June 28, 2010 10:27 AM

## 2011-01-11 NOTE — Progress Notes (Signed)
  Medications Added METRONIDAZOLE 500 MG TABS (METRONIDAZOLE) Take 1 pill in AM and 1 pill in PM for 7 days - Do not drink alcohol with this medication       Phone Note Call from Patient   Caller: Patient Call For: [phone] Summary of Call: Patient was told if she needed medicine for her problem to let the nurse know to send rx to [facility] Initial call taken by: Abundio Miu,  October 06, 2010 4:32 PM    New/Updated Medications: METRONIDAZOLE 500 MG TABS (METRONIDAZOLE) Take 1 pill in AM and 1 pill in PM for 7 days - Do not drink alcohol with this medication Prescriptions: METRONIDAZOLE 500 MG TABS (METRONIDAZOLE) Take 1 pill in AM and 1 pill in PM for 7 days - Do not drink alcohol with this medication  #14 x 0   Entered and Authorized by:   Renold Don MD   Signed by:   Renold Don MD on 10/07/2010   Method used:   [facility]        [facility]  [address] [phone]* (retail)       [address]       [place], [place]  [address]       Ph: [phone] or [phone]       Fax: [phone]   RxID:   [id number]

## 2011-01-11 NOTE — Letter (Signed)
Summary: Generic Letter  [facility]  [address]   [place], [place] [address]   Phone: [phone]  Fax: [phone]      01/27/2010    Lisa Newton [address] [place], [place]  [address]    Dear [name],   this is a copy of your lab results that you requested to be mailed to you.  Should you have any questions please feel free to contact our office.  Thank you.          Sincerely,    Loralee Pacas CMA

## 2011-01-11 NOTE — Progress Notes (Signed)
Summary: Test results.     Left message for this patient but did not leave results of the test. If she calls let her know that she has Trichomonas and needs to be treated. She can pick up her prescription as it has already been called in. She needs to have her partner treated as well. She needs to not have sex with her partner for 7 days after they are treated so they do not pass the infection between themselves. This is a sexually transmitted infection. If she has any further questions I can call her back.

## 2011-01-11 NOTE — Progress Notes (Signed)
Summary: tiage   Phone Note Call from Patient Call back at Home Phone [phone]   Caller: Patient Summary of Call: ears aching and wants to come in today Initial call taken by: De Nurse,  July 26, 2010 11:05 AM  Follow-up for Phone Call        c/o ear pain x 1 wk. has not seen a doctor. no appts left for today. asked if she wanted to go to UC today. she declined. appt at 8:30 tomorrow. told her to placed a warm cloth over her ear & take otc pain relievers. she agreed with this plan Follow-up by: Golden Circle RN,  July 26, 2010 11:21 AM  Additional Follow-up for Phone Call Additional follow up Details #1::        thanks, noted Additional Follow-up by: Jamie Brookes MD,  July 26, 2010 8:25 PM

## 2011-01-11 NOTE — Progress Notes (Signed)
Summary: triage   Phone Note Call from Patient Call back at Home Phone [phone]   Caller: Patient Summary of Call: pt wants to come in to have her urine checked -  Initial call taken by: De Nurse,  September 29, 2010 10:04 AM  Follow-up for Phone Call        thinks she is aLLERGIC TO CONDOMS.  vag is swollen & irritated. appt late tomorrow. cannot come any earlier Follow-up by: Golden Circle RN,  September 29, 2010 10:22 AM  Additional Follow-up for Phone Call Additional follow up Details #1::        Tell the patient to stop using the Latex, tell her to use polyurethane condoms instead. The lube/spermacide  may also be causing the problem so she needs to not use those either.  Additional Follow-up by: Jamie Brookes MD,  September 29, 2010 4:45 PM

## 2011-01-11 NOTE — Assessment & Plan Note (Signed)
Summary: Trichomonas   Vital Signs:  Patient profile:   41 year old female Weight:      167.5 pounds Temp:     98.8 degrees F oral Pulse rate:   69 / minute Pulse rhythm:   regular BP sitting:   138 / 99  (left arm) Cuff size:   regular  Vitals Entered By: Loralee Pacas CMA (June 23, 2010 10:09 AM) CC: urine smells   Primary Care Provider:  Jamie Brookes MD  CC:  urine smells.  History of Present Illness: Abnormal vaginal smell: Pt has some abnormal vaginal smell. She is not having any increase in frequency, no burning.  She has recently has sex with her "baby's daddy" while smoking marajuana. She was not using protection and is concerned about also having some STI's. She wants to be tested today.   Habits & Providers  Alcohol-Tobacco-Diet     Tobacco Status: quit  Comments: recent MJ use 2 weeks ago  Current Medications (verified): 1)  Flexeril 5 Mg  Tabs (Cyclobenzaprine Hcl) .[name].. 1 Tab By Mouth Three Times A Day As Needed For Muscle Spasms - Do Not Drive While On This Medication 2)  Metronidazole 500 Mg Tabs (Metronidazole) .... Take All 4 Tabs At One Time. Do Not Have Intercourse For 1 Week After You and Your Partner Are Treated.  Allergies (verified): No Known Drug Allergies  Review of Systems        vitals reviewed and pertinent negatives and positives seen in HPI   Physical Exam  General:  Well-developed,well-nourished,in no acute distress; alert,appropriate and cooperative throughout examination Genitalia:  Normal introitus for age, no external lesions, minimal vaginal discharge, mucosa pink and moist, no vaginal or cervical lesions, no vaginal atrophy, no friaility or hemorrhage, normal uterus size and position, no adnexal masses or tenderness, vaginal oder is present.  Psych:  Cognition and judgment appear intact. Alert and cooperative with normal attention span and concentration. No apparent delusions, illusions, hallucinations   Impression &  Recommendations:  Problem # 1:  TRICHOMONAL VAGINITIS (ICD-131.01) Assessment New Pt recently had a lapse and had intercourse with the father of her child. She has not kicked him out of the house, but she is having some vaginal discomfort and smell. Pt found to have Trichomonas. Sent in prescription for metronidazole.   Orders: [facility]- Est Level  3 ([address]272)  Complete Medication List: 1)  Flexeril 5 Mg Tabs (Cyclobenzaprine hcl) .[name].. 1 tab by mouth three times a day as needed for muscle spasms - do not drive while on this medication 2)  Metronidazole 500 Mg Tabs (Metronidazole) .... Take all 4 tabs at one time. do not have intercourse for 1 week after you and your partner are treated.  Other Orders: Urinalysis-[facility] (00000) Urine Culture-[facility] ([id number]) GC/Chlamydia-[facility] ([id number]) HIV-[facility] ([id number]) RPR-[facility] [phone]) Wet Prep- [facility] ([id number]) Hep C Ab-[facility] ([id number]) Prescriptions: METRONIDAZOLE 500 MG TABS (METRONIDAZOLE) take all 4 tabs at one time. Do not have intercourse for 1 week after you and YOUR PARTNER are treated.  #4 x 0   Entered and Authorized by:   Jamie Brookes MD   Signed by:   Jamie Brookes MD on 06/23/2010   Method used:   [facility]        [facility]  [address] [phone]* (retail)       [address]       [place], [place]  [id number]       Ph:  [phone] or [phone]       Fax: [phone]   RxID:   [id number]   Laboratory Results   Urine Tests  Date/Time Received: June 23, 2010 10:28 AM   Date/Time Reported: June 23, 2010 11:11 AM   Routine Urinalysis   Color: yellow Appearance: Clear Glucose: negative   (Normal Range: Negative) Bilirubin: negative   (Normal Range: Negative) Ketone: negative   (Normal Range: Negative) Spec. Gravity: >=1.030   (Normal Range: 1.003-1.035) Blood: negative   (Normal Range: Negative) pH: 5.5   (Normal Range: 5.0-8.0) Protein: negative   (Normal Range:  Negative) Urobilinogen: 0.2   (Normal Range: 0-1) Nitrite: negative   (Normal Range: Negative) Leukocyte Esterace: negative   (Normal Range: Negative)    Comments: urine sent for culture ...........test performed by...........Marland KitchenTerese Door, CMA  Date/Time Received: June 23, 2010 10:28 AM  Date/Time Reported: June 23, 2010 11:11 AM   [employer] Source: vaginal WBC/hpf: 5-10 Bacteria/hpf: 3+  Cocci Clue cells/hpf: none  Positive whiff Yeast/hpf: none Trichomonas/hpf: many Comments: ...........test performed by...........Marland KitchenTerese Door, CMA

## 2011-01-11 NOTE — Assessment & Plan Note (Signed)
Summary: allergic to condoms per pt/Avenel/strother   Vital Signs:  Patient profile:   40 year old female Height:      65 inches Weight:      179.8 pounds BMI:     30.03 Temp:     98.9 degrees F oral Pulse rate:   71 / minute BP sitting:   135 / 88  (left arm) Cuff size:   regular  Vitals Entered By: Garen Grams LPN (September 30, 2010 3:24 PM) CC: ? allergic to condoms Is Patient Diabetic? No   Primary Care Sigourney Portillo:  Jamie Brookes MD  CC:  ? allergic to condoms.  History of Present Illness: 1.  Condom problem:  Patient concerned she may be allergic to condoms.  After sex with condoms she describes "uncomfortable" feeling, but unable to further qualify description.  States no itching, no real burning, just uncomfortable.  Does not feel this way when she has sex without condoms.  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    2. Wants STD check:  History of trichomonas, treated with Flagyl.  No other STDs.  Has had sex recently with previous partner, also sex with new partner 2 days ago.  Would like to be checked for STDs b/c she is worried.  No symptoms, see ROS below.  ROS:  no vaginal itching, burning, discharge, redness.  No oral thrush, night sweats, weight loss.    Habits & Providers  Alcohol-Tobacco-Diet     Tobacco Status: quit  Current Problems (verified): 1)  Vaginitis  (ICD-616.10) 2)  Ear Pain, Referred  (ICD-388.72) 3)  Oral Contraception  (ICD-V25.41) 4)  Hypertension  (ICD-401.1)  Current Medications (verified): 1)  Flexeril 5 Mg  Tabs (Cyclobenzaprine Hcl) .[name].. 1 Tab By Mouth Three Times A Day As Needed For Muscle Spasms - Do Not Drive While On This Medication 2)  Cetirizine Hcl 10 Mg Tabs (Cetirizine Hcl) .... Take 1 Pill Every Day. Take At Night If It Makes To Drowsy.  Allergies (verified): No Known Drug Allergies  Past History:  Past  medical, surgical, family and social histories (including risk factors) reviewed for relevance to current acute and chronic problems.  Past Medical History: Reviewed history from 11/22/2007 and no changes required. 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 30decibel low freq hearing dropping off - 04/12/2004, 96%; right ear 15decibels better - 04/12/2004, Audiometry:  mild 4000 Hz notch R side - 04/12/2004, Indwelling T tube L ear (change q 3-66yrs) - 04/12/2004  multiple L ear surg. as a child - 01/31/2001, to 70decibels at high freq; discrimination - 04/12/2004  Family History: Reviewed history from 03/07/2007 and no changes required. 2 sisters-one with HTN, Father-53yo, healthy, Mother-50yo, HTN  Social History: Reviewed history from 09/03/2010 and no changes required. lives with 3 children (15, 10, 2); no tobacco, social ETOH; Stopped using chronic marijuana,  total of 15 sexual partners; no current exercise; family (sisters) in the area, Former Smoker, sophomore in school.   Physical Exam  General:  Vital signs reviewed. Well-developed, well-nourished patient in NAD.  Awake and cooperative  Genitalia:  Normal introitus for age, no external lesions, no vaginal discharge, mucosa pink and moist, no vaginal or cervical  lesions, no vaginal atrophy, no friaility or hemorrhage, normal uterus size and position, no adnexal masses or tenderness.    Impression & Recommendations:  Problem # 1:  VAGINITIS (ICD-616.10) Likely secondary to condom/latex allergy.  Provided patient with printed examples of non-latex condoms.  Patient to continue practicing safe sex with condom use but to use only non-latex condoms.  Patient expresses understanding.  She also desires STD check, obtained RPR, HIV, wet prep,  GC/Chlaymida at visit.  Will let patient know results.    Orders: GC/Chlamydia-[facility] (87591/87491) Wet Prep- [facility] (87564) [facility]- Est Level  3 (33295)  Complete Medication List: 1)  Flexeril 5 Mg Tabs (Cyclobenzaprine hcl) .[name].. 1 tab by mouth three times a day as needed for muscle spasms - do not drive while on this medication 2)  Cetirizine Hcl 10 Mg Tabs (Cetirizine hcl) .... Take 1 pill every day. take at night if it makes to drowsy.  Patient Instructions: 1)  Stop using the Latex. 2)  Use polyurethane condoms instead.  3)  The lube/spermacide  may also be causing the problem. 4)  We will let you know the results from your lab tests.  It was good to meet you today!   Orders Added: 1)  GC/Chlamydia-[facility] [87591/87491] 2)  Wet Prep- [facility] [87210] 3)  [facility]- Est Level  3 [18841]    Laboratory Results  Date/Time Received: September 30, 2010 3:54 PM  Date/Time Reported: September 30, 2010 4:07 PM   Wet [place] Source: vag WBC/hpf: 10-20 Bacteria/hpf: 3+  Cocci Clue cells/hpf: many  Positive whiff Yeast/hpf: none Trichomonas/hpf: none Comments: ...............test performed by......Marland KitchenBonnie A. [place], MLS (ASCP)cm

## 2011-01-11 NOTE — Letter (Signed)
Summary: Results Follow-up Letter  [facility]  [address]   [place], [place] [address]   Phone: [phone]  Fax: [phone]    10/05/2010  [address] [place], [place]  [address]  Dear Lisa Newton,   The following are the results of your recent test(s):  You have tested NEGATIVE for Gonorrhea and Chlamydia.  These are the two tests we obtained with the swab during the pelvic exam.  Negative means that you do not have these diseases, which is good news.  As we discussed, your other tests were also good that we obtained in clinic.  If you have any questions, please call the office.  It was good to see you in clinic.  Take care.   Sincerely,  Renold Don MD [facility]           Appended Document: Results Follow-up Letter mailed.

## 2011-01-12 ENCOUNTER — Encounter: Payer: Self-pay | Admitting: *Deleted

## 2011-02-23 ENCOUNTER — Emergency Department (HOSPITAL_COMMUNITY)
Admission: EM | Admit: 2011-02-23 | Discharge: 2011-02-23 | Disposition: A | Discharge status: Home / Self Care | Payer: Medicaid Other | Attending: Emergency Medicine | Admitting: Emergency Medicine

## 2011-02-23 DIAGNOSIS — B9789 Other viral agents as the cause of diseases classified elsewhere: Secondary | ICD-10-CM | POA: Insufficient documentation

## 2011-02-23 DIAGNOSIS — R197 Diarrhea, unspecified: Secondary | ICD-10-CM | POA: Insufficient documentation

## 2011-02-23 DIAGNOSIS — R51 Headache: Secondary | ICD-10-CM | POA: Insufficient documentation

## 2011-02-23 DIAGNOSIS — J029 Acute pharyngitis, unspecified: Secondary | ICD-10-CM | POA: Insufficient documentation

## 2011-02-23 DIAGNOSIS — R112 Nausea with vomiting, unspecified: Secondary | ICD-10-CM | POA: Insufficient documentation

## 2011-02-23 DIAGNOSIS — K5289 Other specified noninfective gastroenteritis and colitis: Secondary | ICD-10-CM | POA: Insufficient documentation

## 2011-02-23 DIAGNOSIS — J3489 Other specified disorders of nose and nasal sinuses: Secondary | ICD-10-CM | POA: Insufficient documentation

## 2011-02-23 LAB — POCT I-STAT, CHEM 8
BUN: 10 mg/dL (ref 6–23)
Chloride: 109 mEq/L (ref 96–112)
Creatinine, Ser: 0.9 mg/dL (ref 0.4–1.2)
Potassium: 4.1 mEq/L (ref 3.5–5.1)
Sodium: 137 mEq/L (ref 135–145)

## 2011-02-23 LAB — CBC
Hemoglobin: 13.3 g/dL (ref 12.0–15.0)
MCV: 92 fL (ref 78.0–100.0)
Platelets: 218 10*3/uL (ref 150–400)

## 2011-02-23 LAB — DIFFERENTIAL
Eosinophils Absolute: 0 10*3/uL (ref 0.0–0.7)
Lymphocytes Relative: 13 % (ref 12–46)
Monocytes Relative: 6 % (ref 3–12)
Neutrophils Relative %: 81 % — ABNORMAL HIGH (ref 43–77)

## 2011-02-23 LAB — URINALYSIS, ROUTINE W REFLEX MICROSCOPIC
Glucose, UA: NEGATIVE mg/dL
Hgb urine dipstick: NEGATIVE
Ketones, ur: NEGATIVE mg/dL
Specific Gravity, Urine: 1.024 (ref 1.005–1.030)
pH: 8 (ref 5.0–8.0)

## 2011-03-15 LAB — URINALYSIS, ROUTINE W REFLEX MICROSCOPIC
Hgb urine dipstick: NEGATIVE
Ketones, ur: NEGATIVE mg/dL
Nitrite: NEGATIVE
Protein, ur: NEGATIVE mg/dL
Specific Gravity, Urine: 1.019 (ref 1.005–1.030)

## 2011-03-15 LAB — URINE MICROSCOPIC-ADD ON

## 2011-03-15 LAB — POCT PREGNANCY, URINE: Preg Test, Ur: NEGATIVE

## 2011-03-23 LAB — RAPID STREP SCREEN (MED CTR MEBANE ONLY): Streptococcus, Group A Screen (Direct): NEGATIVE

## 2011-04-29 NOTE — Discharge Summary (Signed)
[facility]  Patient:    Lisa Newton, Lisa Newton Visit Number: [id number] MRN: [id number]          Service Type: OBS Location: 910A [id number] Attending Physician:  Michaelle Copas Dictated by:   Raynelle Jan, M.D. Adm. Date:  07/31/2001 Disc. Date: 08/03/2001                             Discharge Summary  DATE OF BIRTH:                [birth year]  DISCHARGE DIAGNOSES:          1. Term pregnancy, delivered.                               2. Preeclampsia.  DISCHARGE MEDICATIONS:        1. Ibuprofen 600 mg p.o. q.6h. p.r.n. pain.                               2. Ortho-Evra patch weekly.                               3. Prenatal vitamins q.d.  DISCHARGE FOLLOW-UP:          She is to follow up with the family practice center in six weeks.  BRIEF HISTORY:                Amoree is a 40 year old female, gravida 6, para 2-0-3-2, who presented at 37-6/7 weeks with headache and contractions. On admission, she was 2-3, 50%, and -3.  Her PIH labs were significant for an elevated uric acid at 6.5.  The fetal heart tones were reassuring.  HOSPITAL COURSE:              The patient was admitted for induction of labor due to symptomatic preeclampsia.  She was started on magnesium due to her symptoms during active labor and was given penicillin prophylaxis due to her GBS positive status.  On July 31, 2001, at approximately 2145 hours, the patient was ruptured with clear fluid.  Internal monitoring was placed.  The patients Pitocin was titrated to ensure a good contraction pattern.  On August 01, 2001, at 0851 hours, she progressed to complete and began pushing. She delivered at 10:11 a.m. a viable female infant with Apgars of 7 at one minute and 9 at five minutes, complicated by body cord x 1 and a true knot in the umbilical cord.  The placenta was delivered at 10:16 a.m., intact.  The patient was taken to the ICU where she remained on magnesium for 24  hours.  On August 02, 2001, the patients magnesium was turned off.  She continued to diurese well and by August 03, 2001, as her laboratory work was stable and her condition was improved, she was discharged home with appropriate follow-up. Dictated by:   Raynelle Jan, M.D. Attending Physician:  Michaelle Copas DD:  08/03/01 TD:  08/04/01 Job: [id number]

## 2011-05-20 ENCOUNTER — Ambulatory Visit (INDEPENDENT_AMBULATORY_CARE_PROVIDER_SITE_OTHER): Payer: PRIVATE HEALTH INSURANCE | Admitting: *Deleted

## 2011-05-20 DIAGNOSIS — Z111 Encounter for screening for respiratory tuberculosis: Secondary | ICD-10-CM

## 2011-05-23 ENCOUNTER — Ambulatory Visit (INDEPENDENT_AMBULATORY_CARE_PROVIDER_SITE_OTHER): Payer: PRIVATE HEALTH INSURANCE | Admitting: *Deleted

## 2011-05-23 DIAGNOSIS — Z111 Encounter for screening for respiratory tuberculosis: Secondary | ICD-10-CM

## 2011-05-23 DIAGNOSIS — IMO0001 Reserved for inherently not codable concepts without codable children: Secondary | ICD-10-CM

## 2011-05-23 LAB — TB SKIN TEST
Induration: 0
TB Skin Test: NEGATIVE mm

## 2011-09-22 LAB — BASIC METABOLIC PANEL
BUN: 3 — ABNORMAL LOW
CO2: 24
Calcium: 9.1
Chloride: 105
Creatinine, Ser: 0.44
GFR calc Af Amer: >60
GFR calc non Af Amer: >60
Glucose, Bld: 89
Potassium: 3.8
Sodium: 134 — ABNORMAL LOW

## 2011-09-22 LAB — URINALYSIS, ROUTINE W REFLEX MICROSCOPIC
Bilirubin Urine: NEGATIVE
Hgb urine dipstick: NEGATIVE
Specific Gravity, Urine: 1.015
Urobilinogen, UA: 0.2

## 2011-09-22 LAB — CBC
HCT: 37.3
Hemoglobin: 13
MCHC: 35
MCV: 94.8
Platelets: 210
RBC: 3.93
RDW: 13.3
WBC: 11.7 — ABNORMAL HIGH

## 2011-09-22 LAB — WET PREP, GENITAL
Trich, Wet Prep: NONE SEEN
Yeast Wet Prep HPF POC: NONE SEEN

## 2011-09-22 LAB — POCT URINALYSIS DIP (DEVICE)
Hgb urine dipstick: NEGATIVE
pH: 7

## 2011-09-22 LAB — GC/CHLAMYDIA PROBE AMP, GENITAL: GC Probe Amp, Genital: NEGATIVE

## 2011-09-22 LAB — HCG, QUANTITATIVE, PREGNANCY: hCG, Beta Chain, Quant, S: 147358 — ABNORMAL HIGH

## 2011-10-13 ENCOUNTER — Inpatient Hospital Stay (INDEPENDENT_AMBULATORY_CARE_PROVIDER_SITE_OTHER)
Admission: RE | Admit: 2011-10-13 | Discharge: 2011-10-13 | Disposition: A | Discharge status: Home / Self Care | Payer: Self-pay | Source: Ambulatory Visit | Attending: Family Medicine | Admitting: Family Medicine

## 2011-10-13 DIAGNOSIS — J069 Acute upper respiratory infection, unspecified: Secondary | ICD-10-CM

## 2012-01-11 ENCOUNTER — Encounter: Payer: Self-pay | Admitting: Family Medicine

## 2012-01-11 ENCOUNTER — Ambulatory Visit (INDEPENDENT_AMBULATORY_CARE_PROVIDER_SITE_OTHER): Payer: Self-pay | Admitting: Family Medicine

## 2012-01-11 VITALS — BP 135/86 | HR 90 | Ht 65.0 in | Wt 168.0 lb

## 2012-01-11 DIAGNOSIS — F329 Major depressive disorder, single episode, unspecified: Secondary | ICD-10-CM | POA: Insufficient documentation

## 2012-01-11 DIAGNOSIS — Z01419 Encounter for gynecological examination (general) (routine) without abnormal findings: Secondary | ICD-10-CM | POA: Insufficient documentation

## 2012-01-11 DIAGNOSIS — Z20828 Contact with and (suspected) exposure to other viral communicable diseases: Secondary | ICD-10-CM

## 2012-01-11 DIAGNOSIS — R4586 Emotional lability: Secondary | ICD-10-CM

## 2012-01-11 DIAGNOSIS — Z202 Contact with and (suspected) exposure to infections with a predominantly sexual mode of transmission: Secondary | ICD-10-CM

## 2012-01-11 DIAGNOSIS — F39 Unspecified mood [affective] disorder: Secondary | ICD-10-CM

## 2012-01-11 DIAGNOSIS — Z23 Encounter for immunization: Secondary | ICD-10-CM

## 2012-01-11 DIAGNOSIS — Z0000 Encounter for general adult medical examination without abnormal findings: Secondary | ICD-10-CM

## 2012-01-11 DIAGNOSIS — IMO0001 Reserved for inherently not codable concepts without codable children: Secondary | ICD-10-CM | POA: Insufficient documentation

## 2012-01-11 DIAGNOSIS — Z309 Encounter for contraceptive management, unspecified: Secondary | ICD-10-CM

## 2012-01-11 DIAGNOSIS — F32A Depression, unspecified: Secondary | ICD-10-CM

## 2012-01-11 DIAGNOSIS — A599 Trichomoniasis, unspecified: Secondary | ICD-10-CM | POA: Insufficient documentation

## 2012-01-11 HISTORY — DX: Depression, unspecified: F32.A

## 2012-01-11 LAB — POCT WET PREP (WET MOUNT)

## 2012-01-11 MED ORDER — NORGESTIMATE-ETH ESTRADIOL 0.25-35 MG-MCG PO TABS: 1.0000 | ORAL_TABLET | Freq: Every day | ORAL | Status: DC

## 2012-01-11 MED ORDER — METRONIDAZOLE 500 MG PO TABS: 2000.0000 mg | ORAL_TABLET | Freq: Once | ORAL | Status: AC

## 2012-01-11 NOTE — Assessment & Plan Note (Signed)
Metronidazole, left message on patients phone

## 2012-01-11 NOTE — Assessment & Plan Note (Signed)
Filled out form for work- ok to work in [employer].  UTD on TB.  Gave flu vaccine today.  Advised fasting labwork given ? History of hypertension and fh of diabetes.  Will return for that but requests HIV, RPR and STD screening today due to history of unprotected sex.

## 2012-01-11 NOTE — Assessment & Plan Note (Signed)
Screening positive for possible mood disorder.  Appears more prolonged than adjustment disorder.  Currenlty not at risk to self or others and is reluctant to consider pharm tx or therapy.  Uses her faith as strength and would like to "do it on her own"  Discussed her maladaptive behavior and need for further exploration, diagnosis, and further discussion on pros/cons of treatment.  She is agreeable to returning in 1-2 weeks for follow-up.
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
## 2012-01-11 NOTE — Assessment & Plan Note (Signed)
Discussed options with patient.  Will start OCP.

## 2012-01-11 NOTE — Progress Notes (Signed)
Addended by: Macy Mis on: 01/11/2012 12:32 PM   Modules accepted: Orders

## 2012-01-11 NOTE — Patient Instructions (Addendum)
It is not your fault  We can offer counseling services and a follow-up appointment to discuss your mood more  Consider therapy to discuss feelings- can be very helpful in helping your create healthy balance and habits in your life.  Make a follow-up at your next convenience in the next few weeks.  Will get fasting bloodwork at that time.  Take a multivitamin every day  Always use condoms  Will call you if results are abnormal-otherwise will send a letetr

## 2012-01-12 ENCOUNTER — Encounter: Payer: Self-pay | Admitting: Family Medicine

## 2012-01-12 LAB — HIV ANTIBODY (ROUTINE TESTING W REFLEX): HIV: NONREACTIVE

## 2012-01-12 LAB — GC/CHLAMYDIA PROBE AMP, GENITAL
Chlamydia, DNA Probe: NEGATIVE
GC Probe Amp, Genital: NEGATIVE

## 2012-01-12 LAB — RPR

## 2012-01-24 ENCOUNTER — Ambulatory Visit: Payer: Self-pay | Admitting: Family Medicine

## 2012-01-24 ENCOUNTER — Emergency Department (INDEPENDENT_AMBULATORY_CARE_PROVIDER_SITE_OTHER)
Admission: EM | Admit: 2012-01-24 | Discharge: 2012-01-24 | Disposition: A | Discharge status: Home / Self Care | Payer: Self-pay | Source: Home / Self Care | Attending: Family Medicine | Admitting: Family Medicine

## 2012-01-24 ENCOUNTER — Encounter (HOSPITAL_COMMUNITY): Payer: Self-pay

## 2012-01-24 DIAGNOSIS — K5289 Other specified noninfective gastroenteritis and colitis: Secondary | ICD-10-CM

## 2012-01-24 DIAGNOSIS — K529 Noninfective gastroenteritis and colitis, unspecified: Secondary | ICD-10-CM

## 2012-01-24 MED ORDER — LOPERAMIDE HCL 2 MG PO CAPS: 2.0000 mg | ORAL_CAPSULE | Freq: Four times a day (QID) | ORAL | Status: AC | PRN

## 2012-01-24 MED ORDER — ONDANSETRON HCL 4 MG PO TABS: 4.0000 mg | ORAL_TABLET | Freq: Four times a day (QID) | ORAL | Status: AC

## 2012-01-24 MED ORDER — ONDANSETRON 4 MG PO TBDP
4.0000 mg | ORAL_TABLET | Freq: Once | ORAL | Status: AC
  Administered 2012-01-24: 4 mg via ORAL

## 2012-01-24 MED ORDER — ONDANSETRON 4 MG PO TBDP
ORAL_TABLET | ORAL | Status: AC
  Filled 2012-01-24: qty 1

## 2012-01-24 NOTE — Discharge Instructions (Signed)
Clear liquids today , bland diet tonight as tolerated, advance on wed as improved, use medicine as needed, return or see your doctor if any problems.

## 2012-01-24 NOTE — ED Notes (Signed)
C/o vomiting and diarrhea since last pm.  Last vomited and had diarrhea around 4 am.  C/o lt ear pain and headache today as well as generalized stomach ache.

## 2012-01-24 NOTE — ED Provider Notes (Signed)
History     CSN: [id number]  Arrival date & time 01/24/12  1191   First MD Initiated Contact with Patient 01/24/12 1059      Chief Complaint  Patient presents with  . Emesis  . Diarrhea    (Consider location/radiation/quality/duration/timing/severity/associated sxs/prior treatment) Patient is a 41 y.o. female presenting with vomiting.  Emesis  This is a new problem. The current episode started 6 to 12 hours ago. The problem occurs 5 to 10 times per day. The problem has not changed since onset.The emesis has an appearance of stomach contents. There has been no fever. Associated symptoms include chills and diarrhea. Pertinent negatives include no abdominal pain, no fever and no URI. Risk factors include ill contacts.    Past Medical History  Diagnosis Date  . Left ear hearing loss     Past Surgical History  Procedure Date  . Left ear surgery     ruptured TM    Family History  Problem Relation Age of Onset  . Cancer Mother     lung cancer  . Diabetes Father   . Hypertension Father     History  Substance Use Topics  . Smoking status: Never Smoker   . Smokeless tobacco: Not on file  . Alcohol Use: No    OB History    Grav Para Term Preterm Abortions TAB SAB Ect Mult Living                  Review of Systems  Constitutional: Positive for chills. Negative for fever.  HENT: Negative.   Respiratory: Negative.   Gastrointestinal: Positive for nausea, vomiting and diarrhea. Negative for abdominal pain.  Genitourinary: Negative.     Allergies  Review of patient's allergies indicates no known allergies.  Home Medications   Current Outpatient Rx  Name Route Sig Dispense Refill  . CETIRIZINE HCL 10 MG PO TABS Oral Take 10 mg by mouth daily. Take at night if it makes to drowsy     . LOPERAMIDE HCL 2 MG PO CAPS Oral Take 1 capsule (2 mg total) by mouth 4 (four) times daily as needed for diarrhea or loose stools. 12 capsule 0  . NORGESTIMATE-ETH ESTRADIOL 0.25-35  MG-MCG PO TABS Oral Take 1 tablet by mouth daily. 1 Package 11  . ONDANSETRON HCL 4 MG PO TABS Oral Take 1 tablet (4 mg total) by mouth every 6 (six) hours. For n/v 8 tablet 0    BP 140/89  Pulse 87  Temp(Src) 100.1 F (37.8 C) (Oral)  Resp 18  SpO2 98%  LMP 12/31/2011  Physical Exam  Nursing note and vitals reviewed. Constitutional: She is oriented to person, place, and time. She appears well-developed and well-nourished.  HENT:  Head: Normocephalic.  Right Ear: External ear normal.  Left Ear: External ear normal.  Mouth/Throat: Oropharynx is clear and moist.  Eyes: Pupils are equal, round, and reactive to light.  Neck: Normal range of motion. Neck supple.  Cardiovascular: Normal rate, normal heart sounds and intact distal pulses.   Pulmonary/Chest: Breath sounds normal.  Abdominal: Soft. Bowel sounds are normal. She exhibits no distension. There is no tenderness. There is no rebound and no guarding.  Lymphadenopathy:    She has no cervical adenopathy.  Neurological: She is alert and oriented to person, place, and time.  Skin: Skin is warm and dry.  Psychiatric: She has a normal mood and affect.    ED Course  Procedures (including critical care time)  Labs Reviewed - No  data to display No results found.   1. Gastroenteritis, acute       MDM          Barkley Bruns, MD 01/24/12 1116

## 2012-02-21 ENCOUNTER — Other Ambulatory Visit (HOSPITAL_COMMUNITY)
Admission: RE | Admit: 2012-02-21 | Discharge: 2012-02-21 | Disposition: A | Discharge status: Home / Self Care | Payer: Self-pay | Source: Ambulatory Visit | Attending: Family Medicine | Admitting: Family Medicine

## 2012-02-21 ENCOUNTER — Encounter: Payer: Self-pay | Admitting: Family Medicine

## 2012-02-21 ENCOUNTER — Ambulatory Visit (INDEPENDENT_AMBULATORY_CARE_PROVIDER_SITE_OTHER): Payer: Self-pay | Admitting: Family Medicine

## 2012-02-21 VITALS — BP 132/90 | HR 83 | Temp 98.7°F | Ht 65.0 in | Wt 167.8 lb

## 2012-02-21 DIAGNOSIS — A499 Bacterial infection, unspecified: Secondary | ICD-10-CM

## 2012-02-21 DIAGNOSIS — N760 Acute vaginitis: Secondary | ICD-10-CM

## 2012-02-21 DIAGNOSIS — Z113 Encounter for screening for infections with a predominantly sexual mode of transmission: Secondary | ICD-10-CM | POA: Insufficient documentation

## 2012-02-21 DIAGNOSIS — B9689 Other specified bacterial agents as the cause of diseases classified elsewhere: Secondary | ICD-10-CM

## 2012-02-21 LAB — POCT WET PREP (WET MOUNT)

## 2012-02-21 MED ORDER — METRONIDAZOLE 0.75 % VA GEL: 1.0000 | Freq: Every day | VAGINAL | Status: DC

## 2012-02-21 NOTE — Progress Notes (Signed)
  Subjective:    Patient ID: Lisa Newton, female    DOB: 07/17/1971, 41 y.o.   MRN: [id number]  HPI 41 yo here for evaluation of continued vaginal discharge  Was treated for trichomonas in January.  Took metronidazole 2 gm x 1.  Has continued to have unprotected sex with same partner who did not get evaluated.  No abdominal pain, fever, dysuria.   Review of Systemssee HPI     Objective:   Physical Exam  GEN: Alert & Oriented, No acute distress Pelvic Exam:        External: normal female genitalia without lesions or masses        Vagina: normal without lesions or masses        Cervix: normal without lesions or masses        Adnexa: normal bimanual exam without masses or fullness        Uterus: normal by palpation        Samples for Wet prep, GC/Chlamydia obtained        Assessment & Plan:

## 2012-02-21 NOTE — Patient Instructions (Signed)
Will check you for trichomonas, gonorrhea, and chlamydia today  Do not have sex until you and partners are treated  Please make follow-up at your next convenience to discuss your health in more detail and get fasting labwork (no eating or drinking except water for 8 hours)

## 2012-02-21 NOTE — Assessment & Plan Note (Addendum)
She prefers metrogel.

## 2012-02-24 ENCOUNTER — Encounter: Payer: Self-pay | Admitting: Family Medicine

## 2012-03-26 ENCOUNTER — Other Ambulatory Visit: Payer: Self-pay | Admitting: Family Medicine

## 2012-06-17 ENCOUNTER — Other Ambulatory Visit: Payer: Self-pay | Admitting: Family Medicine

## 2012-07-03 ENCOUNTER — Ambulatory Visit: Payer: Self-pay | Admitting: Family Medicine

## 2012-07-05 ENCOUNTER — Ambulatory Visit (INDEPENDENT_AMBULATORY_CARE_PROVIDER_SITE_OTHER): Payer: Self-pay | Admitting: Family Medicine

## 2012-07-05 ENCOUNTER — Encounter: Payer: Self-pay | Admitting: Family Medicine

## 2012-07-05 ENCOUNTER — Other Ambulatory Visit (HOSPITAL_COMMUNITY)
Admission: RE | Admit: 2012-07-05 | Discharge: 2012-07-05 | Disposition: A | Discharge status: Home / Self Care | Payer: Self-pay | Source: Ambulatory Visit | Attending: Family Medicine | Admitting: Family Medicine

## 2012-07-05 VITALS — BP 135/90 | HR 69 | Ht 65.0 in | Wt 168.0 lb

## 2012-07-05 DIAGNOSIS — Z9189 Other specified personal risk factors, not elsewhere classified: Secondary | ICD-10-CM

## 2012-07-05 DIAGNOSIS — R5383 Other fatigue: Secondary | ICD-10-CM | POA: Insufficient documentation

## 2012-07-05 DIAGNOSIS — A599 Trichomoniasis, unspecified: Secondary | ICD-10-CM

## 2012-07-05 DIAGNOSIS — Z0000 Encounter for general adult medical examination without abnormal findings: Secondary | ICD-10-CM

## 2012-07-05 DIAGNOSIS — Z202 Contact with and (suspected) exposure to infections with a predominantly sexual mode of transmission: Secondary | ICD-10-CM

## 2012-07-05 DIAGNOSIS — F329 Major depressive disorder, single episode, unspecified: Secondary | ICD-10-CM

## 2012-07-05 DIAGNOSIS — Z113 Encounter for screening for infections with a predominantly sexual mode of transmission: Secondary | ICD-10-CM | POA: Insufficient documentation

## 2012-07-05 DIAGNOSIS — N898 Other specified noninflammatory disorders of vagina: Secondary | ICD-10-CM

## 2012-07-05 LAB — POCT URINE PREGNANCY: Preg Test, Ur: NEGATIVE

## 2012-07-05 LAB — POCT WET PREP (WET MOUNT)

## 2012-07-05 MED ORDER — METRONIDAZOLE 500 MG PO TABS: 2000.0000 mg | ORAL_TABLET | Freq: Once | ORAL | Status: AC

## 2012-07-05 MED ORDER — CITALOPRAM HYDROBROMIDE 20 MG PO TABS: 20.0000 mg | ORAL_TABLET | Freq: Every day | ORAL | Status: DC

## 2012-07-05 NOTE — Patient Instructions (Signed)
If you feel hopeless, thoughts of suicide or worse feeling, please let me know  Please contact Dr. Pascal Lux, clinical psychologist, to set up an appointment.  She can be reached at [phone].    Follow-up in 2-3 weeks

## 2012-07-06 ENCOUNTER — Encounter: Payer: Self-pay | Admitting: Family Medicine

## 2012-07-06 LAB — TSH: TSH: 3.921 u[IU]/mL (ref 0.350–4.500)

## 2012-07-06 NOTE — Assessment & Plan Note (Signed)
Encouraged her on her ability to seek care, counseled on not letting stigma get in the way of treatment.  Will start Celexa, advised calling Dr. Pascal Lux to set up counseling which I think she would benefit greatly from.  Discussed precautions for worsening mood or SI.  Follow-up in 2 weeks

## 2012-07-06 NOTE — Assessment & Plan Note (Signed)
Not taking sprintec.  I did educate her on use of plan b for unprotected intercourse to prevent unintended pregnancy

## 2012-07-06 NOTE — Assessment & Plan Note (Addendum)
Reinfection.  Will treated with metronidazole 2 gm.
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
## 2012-07-25 ENCOUNTER — Ambulatory Visit: Payer: Self-pay | Admitting: Family Medicine

## 2012-10-23 ENCOUNTER — Emergency Department (INDEPENDENT_AMBULATORY_CARE_PROVIDER_SITE_OTHER)
Admission: EM | Admit: 2012-10-23 | Discharge: 2012-10-23 | Disposition: A | Discharge status: Hospice | Payer: Self-pay | Source: Home / Self Care | Attending: Emergency Medicine | Admitting: Emergency Medicine

## 2012-10-23 ENCOUNTER — Encounter (HOSPITAL_COMMUNITY): Payer: Self-pay | Admitting: Emergency Medicine

## 2012-10-23 ENCOUNTER — Emergency Department (HOSPITAL_COMMUNITY): Payer: Self-pay

## 2012-10-23 ENCOUNTER — Encounter (HOSPITAL_COMMUNITY): Payer: Self-pay | Admitting: *Deleted

## 2012-10-23 ENCOUNTER — Emergency Department (INDEPENDENT_AMBULATORY_CARE_PROVIDER_SITE_OTHER): Payer: Self-pay

## 2012-10-23 ENCOUNTER — Emergency Department (HOSPITAL_COMMUNITY)
Admission: EM | Admit: 2012-10-23 | Discharge: 2012-10-24 | Disposition: A | Discharge status: Home / Self Care | Payer: Self-pay | Attending: Emergency Medicine | Admitting: Emergency Medicine

## 2012-10-23 DIAGNOSIS — R079 Chest pain, unspecified: Secondary | ICD-10-CM | POA: Insufficient documentation

## 2012-10-23 DIAGNOSIS — M25559 Pain in unspecified hip: Secondary | ICD-10-CM

## 2012-10-23 DIAGNOSIS — R112 Nausea with vomiting, unspecified: Secondary | ICD-10-CM | POA: Insufficient documentation

## 2012-10-23 DIAGNOSIS — H9190 Unspecified hearing loss, unspecified ear: Secondary | ICD-10-CM | POA: Insufficient documentation

## 2012-10-23 DIAGNOSIS — Z8659 Personal history of other mental and behavioral disorders: Secondary | ICD-10-CM | POA: Insufficient documentation

## 2012-10-23 DIAGNOSIS — K219 Gastro-esophageal reflux disease without esophagitis: Secondary | ICD-10-CM | POA: Insufficient documentation

## 2012-10-23 HISTORY — DX: Major depressive disorder, single episode, unspecified: F32.9

## 2012-10-23 LAB — CK TOTAL AND CKMB (NOT AT ARMC)
CK, MB: 2 ng/mL (ref 0.3–4.0)
Relative Index: 1.4 (ref 0.0–2.5)
Total CK: 142 U/L (ref 7–177)

## 2012-10-23 LAB — URINALYSIS, ROUTINE W REFLEX MICROSCOPIC
Bilirubin Urine: NEGATIVE
Hgb urine dipstick: NEGATIVE
Ketones, ur: 15 mg/dL — AB
Nitrite: NEGATIVE
Specific Gravity, Urine: 1.024 (ref 1.005–1.030)
pH: 6 (ref 5.0–8.0)

## 2012-10-23 LAB — COMPREHENSIVE METABOLIC PANEL
ALT: 10 U/L (ref 0–35)
AST: 16 U/L (ref 0–37)
Alkaline Phosphatase: 90 U/L (ref 39–117)
CO2: 26 mEq/L (ref 19–32)
GFR calc Af Amer: >90 mL/min (ref >90)
GFR calc non Af Amer: 87 mL/min — ABNORMAL LOW (ref >90)
Glucose, Bld: 86 mg/dL (ref 70–99)
Potassium: 3.4 mEq/L — ABNORMAL LOW (ref 3.5–5.1)
Sodium: 138 mEq/L (ref 135–145)
Total Protein: 8.2 g/dL (ref 6.0–8.3)

## 2012-10-23 LAB — CBC WITH DIFFERENTIAL/PLATELET
Basophils Absolute: 0 10*3/uL (ref 0.0–0.1)
Lymphocytes Relative: 45 % (ref 12–46)
Lymphs Abs: 4.5 10*3/uL — ABNORMAL HIGH (ref 0.7–4.0)
Neutrophils Relative %: 47 % (ref 43–77)
Platelets: 236 10*3/uL (ref 150–400)
RBC: 4.26 MIL/uL (ref 3.87–5.11)
RDW: 12.4 % (ref 11.5–15.5)
WBC: 10 10*3/uL (ref 4.0–10.5)

## 2012-10-23 MED ORDER — HYDROCODONE-ACETAMINOPHEN 5-325 MG PO TABS
2.0000 | ORAL_TABLET | Freq: Once | ORAL | Status: AC
  Administered 2012-10-23: 2 via ORAL
  Filled 2012-10-23: qty 2

## 2012-10-23 MED ORDER — ONDANSETRON HCL 4 MG/2ML IJ SOLN
4.0000 mg | Freq: Once | INTRAMUSCULAR | Status: AC
  Administered 2012-10-23: 4 mg via INTRAVENOUS
  Filled 2012-10-23: qty 2

## 2012-10-23 MED ORDER — OMEPRAZOLE 20 MG PO CPDR: 20.0000 mg | DELAYED_RELEASE_CAPSULE | Freq: Every day | ORAL | Status: DC

## 2012-10-23 MED ORDER — GI COCKTAIL ~~LOC~~
30.0000 mL | Freq: Once | ORAL | Status: AC
  Administered 2012-10-23: 30 mL via ORAL
  Filled 2012-10-23: qty 30

## 2012-10-23 NOTE — ED Notes (Signed)
Reports chest pain which started two weeks ago.  Patient states that she did have a lot of pain on left side of neck.   Patient states she have tingling in left hand.  Patient states she used ice hot for two days.  Patient states her right hip hurts.

## 2012-10-23 NOTE — ED Provider Notes (Signed)
History     CSN: [id number]  Arrival date & time 10/23/12  1613   First MD Initiated Contact with Patient 10/23/12 1622      Chief Complaint  Patient presents with  . Chest Pain    (Consider location/radiation/quality/duration/timing/severity/associated sxs/prior treatment) Patient is a 41 y.o. female presenting with chest pain. The history is provided by the patient.  Chest Pain The chest pain began more than 2 weeks ago. Chest pain occurs frequently. At its most intense, the pain is at 8/10. The severity of the pain is moderate. The quality of the pain is described as aching. The pain radiates to the left shoulder and left neck. Chest pain is worsened by certain positions. Pertinent negatives for primary symptoms include no fever, no shortness of breath, no cough, no palpitations, no abdominal pain, no nausea and no dizziness.  Pertinent negatives for associated symptoms include no diaphoresis, no lower extremity edema, no numbness, no paroxysmal nocturnal dyspnea and no weakness. There are no known risk factors.  Pertinent negatives for past medical history include no anxiety/panic attacks.     Past Medical History  Diagnosis Date  . Left ear hearing loss     Past Surgical History  Procedure Date  . Left ear surgery     ruptured TM    Family History  Problem Relation Age of Onset  . Cancer Mother     lung cancer  . Diabetes Father   . Hypertension Father     History  Substance Use Topics  . Smoking status: Never Smoker   . Smokeless tobacco: Not on file  . Alcohol Use: No    OB History    Grav Para Term Preterm Abortions TAB SAB Ect Mult Living                  Review of Systems  Constitutional: Positive for activity change. Negative for fever and diaphoresis.  Respiratory: Negative for cough and shortness of breath.   Cardiovascular: Positive for chest pain. Negative for palpitations and leg swelling.  Gastrointestinal: Negative for nausea and abdominal  pain.  Neurological: Negative for dizziness, weakness and numbness.    Allergies  Review of patient's allergies indicates no known allergies.  Home Medications   Current Outpatient Rx  Name  Route  Sig  Dispense  Refill  . CETIRIZINE HCL 10 MG PO TABS      TAKE 1 TABLET EVERY DAY. TAKE AT NIGHT IF IT MAKES TO DROWSY.   90 tablet   2   . CITALOPRAM HYDROBROMIDE 20 MG PO TABS   Oral   Take 1 tablet (20 mg total) by mouth daily.   30 tablet   0     BP 156/78  Pulse 72  Temp 99 F (37.2 C) (Oral)  Resp 16  SpO2 100%  LMP 10/18/2012  Physical Exam  Nursing note and vitals reviewed. Constitutional: Vital signs are normal. She appears well-developed and well-nourished.  Non-toxic appearance. She does not have a sickly appearance. She does not appear ill. No distress.  HENT:  Head: Normocephalic.  Eyes: Pupils are equal, round, and reactive to light.  Neck: Neck supple.  Cardiovascular: Normal rate, normal heart sounds and normal pulses.  Exam reveals no gallop and no friction rub.   No murmur heard. Pulmonary/Chest: Effort normal and breath sounds normal.  Abdominal: Soft.  Musculoskeletal:       Right hip: She exhibits decreased range of motion and tenderness. She exhibits normal strength, no bony  tenderness, no swelling, no crepitus, no deformity and no laceration.       Legs: Lymphadenopathy:    She has no cervical adenopathy.  Neurological: She is alert.  Skin: No rash noted. No erythema.    ED Course  Procedures (including critical care time)  Labs Reviewed - No data to display Dg Hip Complete Right  10/23/2012  *RADIOLOGY REPORT*  Clinical Data: Right hip pain  RIGHT HIP - COMPLETE 2+ VIEW  Comparison: None  Findings: There is no evidence of fracture or dislocation.  There is no evidence of arthropathy or other focal bone abnormality. Soft tissues are unremarkable.  IMPRESSION: Negative exam.   Original Report Authenticated By: Signa Kell, M.D.      1.  Chest pain at rest   2. Hip pain       MDM  Problem #1 atypical chest pain for 2 weeks. Patient in no respiratory symptoms and a normal EKG at urgent care and 18 hours. Patient offers alternative explanation for chest pain will be transferred emergency department for further evaluation. Problem #2 patient also presents with most recent sudden onset of right lateral and posterior hip pain ( nontrauma related), right hip x-rays at urgent care were unremarkable. Advised to take NSAIDs over-the-counter such as Aleve or ibuprofen. Followup with family practice if pain was to persist beyond 2-3 days        Jimmie Molly, MD 10/23/12 1909

## 2012-10-23 NOTE — ED Notes (Signed)
Pt from urgent care for CP on left side. Pt had normal EKG and sent for repeat cardiac markers and further eval of right hip pain (x-ray negative at urgent care.)

## 2012-10-23 NOTE — ED Provider Notes (Signed)
History     CSN: [id number]  Arrival date & time 10/23/12  8469   First MD Initiated Contact with Patient 10/23/12 2122      Chief Complaint  Patient presents with  . Chest Pain  . Hip Pain    right    (Consider location/radiation/quality/duration/timing/severity/associated sxs/prior treatment) HPI Comments: Patient presents today with a chief complaint of chest pain.  She reports that the pain has been intermittent over the past couple of weeks.  She has noticed that the pain typically presents after eating.  Pain not associated with exertion. Pain located substernal and does not radiate.  She also reports that the pain becomes worse when she is lying down.  Pain is not worse with deep breaths.  She reports that this episode of pain occurred at 3 AM this morning.  Pain has been constant since then, but does ease up at times.  She describes the pain as a burning sharp pain.  She denies SOB, diaphoresis, numbness, tingling, dizziness, lightheadedness, or syncope.  She denies fever.  She does have an occasional non productive cough.  She denies LE edema, erythema, or pain.  She is not on any estrogen containing medications.  She denies prior history of DVT or PE.  No prolonged travel or surgery in the past 4 weeks.  No prior history of Cancer.  She does not have any prior cardiac history.  No known history of HTN, Hyperlipidemia, or DM.  No family history of cardiac disease.  She does not smoke.    The history is provided by the patient.    Past Medical History  Diagnosis Date  . Left ear hearing loss   . Depression 01/11/2012    Past Surgical History  Procedure Date  . Left ear surgery     ruptured TM    Family History  Problem Relation Age of Onset  . Cancer Mother     lung cancer  . Diabetes Father   . Hypertension Father     History  Substance Use Topics  . Smoking status: Never Smoker   . Smokeless tobacco: Not on file  . Alcohol Use: No    OB History    Grav Para  Term Preterm Abortions TAB SAB Ect Mult Living                  Review of Systems  Constitutional: Negative for fever and chills.  Respiratory: Negative for shortness of breath.   Cardiovascular: Positive for chest pain. Negative for leg swelling.  Gastrointestinal: Positive for nausea and vomiting. Negative for abdominal pain.  Neurological: Negative for dizziness, syncope and light-headedness.    Allergies  Review of patient's allergies indicates no known allergies.  Home Medications  No current outpatient prescriptions on file.  BP 165/97  Pulse 69  Temp 98.6 F (37 C) (Oral)  Resp 16  SpO2 100%  LMP 10/18/2012  Physical Exam  Nursing note and vitals reviewed. Constitutional: She appears well-developed and well-nourished. No distress.  HENT:  Head: Normocephalic and atraumatic.  Mouth/Throat: Oropharynx is clear and moist.  Neck: Normal range of motion. Neck supple.  Cardiovascular: Normal rate, regular rhythm and normal heart sounds.   Pulmonary/Chest: Effort normal and breath sounds normal. She exhibits tenderness.  Abdominal: Soft. Bowel sounds are normal. She exhibits no mass. There is no tenderness.  Musculoskeletal:       No LE edema or erythema Negative Homan's sign bilaterally  Neurological: She is alert.  Skin: Skin is  warm and dry. No rash noted. She is not diaphoretic. No erythema.  Psychiatric: She has a normal mood and affect.    ED Course  Procedures (including critical care time)  Labs Reviewed  CBC WITH DIFFERENTIAL - Abnormal; Notable for the following:    Lymphs Abs 4.5 (*)     All other components within normal limits  COMPREHENSIVE METABOLIC PANEL - Abnormal; Notable for the following:    Potassium 3.4 (*)     GFR calc non Af Amer 87 (*)     All other components within normal limits  CK TOTAL AND CKMB  TROPONIN I   Dg Hip Complete Right  10/23/2012  *RADIOLOGY REPORT*  Clinical Data: Right hip pain  RIGHT HIP - COMPLETE 2+ VIEW   Comparison: None  Findings: There is no evidence of fracture or dislocation.  There is no evidence of arthropathy or other focal bone abnormality. Soft tissues are unremarkable.  IMPRESSION: Negative exam.   Original Report Authenticated By: Signa Kell, M.D.      No diagnosis found.  Reassessed patient.  She reports that her chest pain has improved after given the GI cocktail.    MDM  Patient is to be discharged with recommendation to follow up with PCP in regards to today's hospital visit. Chest pain is not likely of cardiac or pulmonary etiology d/t presentation, PERC negative, VSS, no new murmur, Heart RRR, breath sounds equal bilaterally, EKG without acute abnormalities, negative troponin, and negative CXR.  Pain improved after given GI cocktail.  Pt has been advised start a PPI and return to the ED is CP becomes exertional, associated with diaphoresis or nausea, radiates to left jaw/arm, worsens or becomes concerning in any way. Pt appears reliable for follow up and is agreeable to discharge.          Pascal Lux Afton, PA-C 10/24/12 2217

## 2012-10-24 NOTE — ED Provider Notes (Signed)
Medical screening examination/treatment/procedure(s) were performed by non-physician practitioner and as supervising physician I was immediately available for consultation/collaboration.  Ethelda Chick, MD 10/24/12 2218

## 2012-12-19 ENCOUNTER — Ambulatory Visit (HOSPITAL_COMMUNITY)
Admission: RE | Admit: 2012-12-19 | Discharge: 2012-12-19 | Disposition: A | Discharge status: Home / Self Care | Payer: Self-pay | Source: Ambulatory Visit | Attending: Family Medicine | Admitting: Family Medicine

## 2012-12-19 ENCOUNTER — Ambulatory Visit (INDEPENDENT_AMBULATORY_CARE_PROVIDER_SITE_OTHER): Payer: Self-pay | Admitting: Emergency Medicine

## 2012-12-19 VITALS — BP 166/88 | HR 97 | Temp 98.6°F | Ht 65.0 in | Wt 164.0 lb

## 2012-12-19 DIAGNOSIS — K529 Noninfective gastroenteritis and colitis, unspecified: Secondary | ICD-10-CM

## 2012-12-19 DIAGNOSIS — R079 Chest pain, unspecified: Secondary | ICD-10-CM | POA: Insufficient documentation

## 2012-12-19 DIAGNOSIS — R300 Dysuria: Secondary | ICD-10-CM

## 2012-12-19 DIAGNOSIS — R109 Unspecified abdominal pain: Secondary | ICD-10-CM

## 2012-12-19 DIAGNOSIS — R112 Nausea with vomiting, unspecified: Secondary | ICD-10-CM

## 2012-12-19 DIAGNOSIS — K5289 Other specified noninfective gastroenteritis and colitis: Secondary | ICD-10-CM

## 2012-12-19 LAB — POCT URINALYSIS DIPSTICK
Bilirubin, UA: NEGATIVE
Ketones, UA: NEGATIVE
Leukocytes, UA: NEGATIVE
Protein, UA: NEGATIVE
Spec Grav, UA: 1.025

## 2012-12-19 MED ORDER — OMEPRAZOLE 20 MG PO CPDR: 20.0000 mg | DELAYED_RELEASE_CAPSULE | Freq: Two times a day (BID) | ORAL | Status: DC

## 2012-12-19 MED ORDER — PROMETHAZINE HCL 25 MG/ML IJ SOLN
25.0000 mg | Freq: Once | INTRAMUSCULAR | Status: AC
  Administered 2012-12-19: 25 mg via INTRAMUSCULAR

## 2012-12-19 MED ORDER — PROMETHAZINE HCL 25 MG PO TABS: 25.0000 mg | ORAL_TABLET | Freq: Three times a day (TID) | ORAL | Status: DC | PRN

## 2012-12-19 MED ORDER — GI COCKTAIL ~~LOC~~
30.0000 mL | Freq: Once | ORAL | Status: AC
  Administered 2012-12-19: 30 mL via ORAL

## 2012-12-19 MED ORDER — SUCRALFATE 1 GM/10ML PO SUSP: 1.0000 g | Freq: Four times a day (QID) | ORAL | Status: DC

## 2012-12-19 NOTE — Progress Notes (Signed)
  Subjective:    Patient ID: Lisa Newton, female    DOB: [birth year], 42 y.o.   MRN: [id number]  HPI Lisa Newton is here for chest pain and n/v/d.  1. Chest pain: She describes substernal chest pain as something is "lodged there."  This has been intermittently present for the last 2 months.  Was seen in the ED for this in November.  Troponin was negative and EKG normal and she was told that it was likely MSK or reflux.  She was also given ibuprofen 800mg  for a muscle tear that she has been taking TID.  She has tried Tums with minimal relief. No associated diaphoresis, shortness of breath.  No radiation of the pain.    2. N/v/d: She reports that she developed nausea, vomiting and diarrhea last night. Associated with a subjective fever and some lower abdominal pain.  No blood in vomit or stool.  Stool reported as loose, 3x in last 24hrs.  Emesis x4 in last 24 hours, mostly mucous.  She also notes that she has a lot of stress in her life currently and her mother recently died from lung cancer.   I have reviewed and updated the following as appropriate: allergies and current medications SHx: non smoker  Review of Systems See HPI    Objective:   Physical Exam BP 166/88  Pulse 97  Temp 98.6 F (37 C) (Oral)  Ht 5\' 5"  (1.651 m)  Wt 164 lb (74.39 kg)  BMI 27.29 kg/m2 Gen: alert, cooperative, patient is tearful and anxious appearing; mild to moderate distress HEENT: AT/[place], sclera white, producing tears, MMM Neck: supple, no LAD CV: RRR, no murmurs Pulm: CTAB, no wheezes or rales Abd: +BS, soft, ND, mild diffuse tenderness to palpation, no rebound or guarding; no epigastric tenderness Ext: no edema, 2+ DP pulses bilaterally Psych: very anxious  EKG: normal EKG, normal sinus rhythm, unchanged from previous tracings.      Assessment & Plan:

## 2012-12-19 NOTE — Patient Instructions (Addendum)
I'm sorry you are feeling bad today. I sent some nausea medicine to your pharmacy.  Take this every 8 hours as needed. Focus on drinking fluids like water, ginger ale, and chicken broth. For the pain in your chest, this does not seem to be from your heart.  It might be reflux. STOP taking ibuprofen.  You can take tylenol. Take omeprazole twice a day for 2 weeks, then once a day. Take sucralfate 3 times a day for 2 weeks, then as needed. Follow up on Friday to see how things are going.  You can cancel this appt if you are feeling better. If you start having high fevers, cannot keep anything down despite the medicine, or start having blood in your stool or vomit, please return to clinic or go to the ER.

## 2012-12-19 NOTE — Assessment & Plan Note (Addendum)
Likely viral.  No hematemesis or blood in stool.  Non-surgical abdominal exam.  Phenergan given in clinic IM.  This seemed to improve her nausea.  Discussed symptomatic care with anti-emetics and oral fluids.  She was agreeable and went home with phenergan.  Follow up in 2 days to reassess.  Reasons to return to go the ED reviewed with patient.

## 2012-12-19 NOTE — Assessment & Plan Note (Addendum)
Atypical.  EKG not concerning for cardiac cause.  Suspect reflux given intermittent course and worsening with frequent ibuprofen use.  Picture somewhat clouded by concurrent viral gastroenteritis.  GI cocktail given in clinic and her pain improved.  Prescribed omeprazole 20mg  BID x2 weeks, then daily.  Also gave sucralfate to use TID for the next 2 weeks, then as needed.  Advised her to stop all NSAIDs.  Follow up in 2 days.

## 2012-12-21 ENCOUNTER — Ambulatory Visit: Payer: Self-pay | Admitting: Emergency Medicine

## 2013-10-24 ENCOUNTER — Ambulatory Visit: Payer: Self-pay | Admitting: Family Medicine

## 2013-10-25 ENCOUNTER — Encounter: Payer: Self-pay | Admitting: Family Medicine

## 2013-10-25 ENCOUNTER — Other Ambulatory Visit (HOSPITAL_COMMUNITY)
Admission: RE | Admit: 2013-10-25 | Discharge: 2013-10-25 | Disposition: A | Discharge status: Home / Self Care | Payer: Medicaid Other | Source: Ambulatory Visit | Attending: Family Medicine | Admitting: Family Medicine

## 2013-10-25 ENCOUNTER — Ambulatory Visit (INDEPENDENT_AMBULATORY_CARE_PROVIDER_SITE_OTHER): Payer: Medicaid Other | Admitting: Family Medicine

## 2013-10-25 VITALS — BP 145/91 | HR 66 | Temp 98.0°F | Ht 66.0 in | Wt 174.8 lb

## 2013-10-25 DIAGNOSIS — Z113 Encounter for screening for infections with a predominantly sexual mode of transmission: Secondary | ICD-10-CM | POA: Insufficient documentation

## 2013-10-25 DIAGNOSIS — K5289 Other specified noninfective gastroenteritis and colitis: Secondary | ICD-10-CM

## 2013-10-25 DIAGNOSIS — IMO0002 Reserved for concepts with insufficient information to code with codable children: Secondary | ICD-10-CM

## 2013-10-25 DIAGNOSIS — M62838 Other muscle spasm: Secondary | ICD-10-CM

## 2013-10-25 DIAGNOSIS — R12 Heartburn: Secondary | ICD-10-CM

## 2013-10-25 DIAGNOSIS — Z309 Encounter for contraceptive management, unspecified: Secondary | ICD-10-CM

## 2013-10-25 DIAGNOSIS — Z23 Encounter for immunization: Secondary | ICD-10-CM

## 2013-10-25 DIAGNOSIS — Z0271 Encounter for disability determination: Secondary | ICD-10-CM

## 2013-10-25 DIAGNOSIS — N912 Amenorrhea, unspecified: Secondary | ICD-10-CM

## 2013-10-25 DIAGNOSIS — IMO0001 Reserved for inherently not codable concepts without codable children: Secondary | ICD-10-CM

## 2013-10-25 DIAGNOSIS — T7491XA Unspecified adult maltreatment, confirmed, initial encounter: Secondary | ICD-10-CM

## 2013-10-25 DIAGNOSIS — K529 Noninfective gastroenteritis and colitis, unspecified: Secondary | ICD-10-CM

## 2013-10-25 HISTORY — DX: Encounter for disability determination: Z02.71

## 2013-10-25 LAB — POCT WET PREP (WET MOUNT)

## 2013-10-25 MED ORDER — CYCLOBENZAPRINE HCL 5 MG PO TABS: 5.0000 mg | ORAL_TABLET | Freq: Three times a day (TID) | ORAL | Status: DC | PRN

## 2013-10-25 MED ORDER — OMEPRAZOLE 20 MG PO CPDR: 20.0000 mg | DELAYED_RELEASE_CAPSULE | Freq: Every day | ORAL | Status: DC
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
## 2013-10-25 NOTE — Patient Instructions (Signed)
It was nice to meet you today.  If the results of your tests are negative, you will most likely receive a letter in the mail. If any are positive you will receive a call from someone in the clinic.   Please schedule an annual exam as soon as possible.  For heartburn: please take omeprazole 1 tablet a day.  For your neck muscle spasms, take 1 tablet of flexeril as needed every 8 hours. If this continues please schedule another appointment.

## 2013-10-25 NOTE — Assessment & Plan Note (Signed)
Has had these in the past, but now back after restarting work and feeling more stressed. Flexeril #30 with no refills, if problem persists she knows she needs to schedule another visit for this problem.
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
## 2013-10-25 NOTE — Assessment & Plan Note (Signed)
Possible viral gastroenteritis that has now resolved. Other possibilities are constipation (though this may be at baseline) or ovarian pathology. Exam is very reassuring, no acute abdomen. Since pain has resolved given return precautions if pain comes back or to try tylenol first.
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
## 2013-10-25 NOTE — Assessment & Plan Note (Signed)
Restart omeprazole 20mg  daily. She had previously been on this earlier in the year.

## 2013-10-25 NOTE — Assessment & Plan Note (Signed)
Negative pregnancy test. Does not want to start any birth control.

## 2013-10-26 LAB — HIV ANTIBODY (ROUTINE TESTING W REFLEX): HIV: NONREACTIVE

## 2013-10-26 LAB — RPR

## 2013-11-04 ENCOUNTER — Encounter: Payer: Self-pay | Admitting: Family Medicine

## 2013-11-05 ENCOUNTER — Telehealth: Payer: Self-pay | Admitting: Family Medicine

## 2013-11-05 ENCOUNTER — Other Ambulatory Visit: Payer: Self-pay | Admitting: Family Medicine

## 2013-11-05 DIAGNOSIS — B9689 Other specified bacterial agents as the cause of diseases classified elsewhere: Secondary | ICD-10-CM

## 2013-11-05 MED ORDER — METRONIDAZOLE 0.75 % VA GEL: 1.0000 | Freq: Every day | VAGINAL | Status: DC

## 2013-11-05 NOTE — Telephone Encounter (Signed)
I spoke with Lisa Newton on the phone to inform her of her results, which were negative except for BV infection. I discussed possibility of treatment based on what symptoms she was experiencing and she decided on using cream for 5 days rather than taking oral flagyl. Prescription was e-prescribed during phone call. -Tawni Carnes

## 2013-11-05 NOTE — Telephone Encounter (Signed)
Pt would like a phone call with the results of her lab work. jw

## 2013-11-05 NOTE — Telephone Encounter (Signed)
To MD as labs are slightly abnormal. [place], Maryjo [place]

## 2013-12-19 ENCOUNTER — Ambulatory Visit (INDEPENDENT_AMBULATORY_CARE_PROVIDER_SITE_OTHER): Payer: Medicaid Other | Admitting: Family Medicine

## 2013-12-19 ENCOUNTER — Encounter: Payer: Self-pay | Admitting: Family Medicine

## 2013-12-19 ENCOUNTER — Other Ambulatory Visit (HOSPITAL_COMMUNITY)
Admission: RE | Admit: 2013-12-19 | Discharge: 2013-12-19 | Disposition: A | Discharge status: Home / Self Care | Payer: Medicaid Other | Source: Ambulatory Visit | Attending: Family Medicine | Admitting: Family Medicine

## 2013-12-19 VITALS — BP 129/86 | HR 67 | Temp 97.8°F | Ht 66.0 in | Wt 180.0 lb

## 2013-12-19 DIAGNOSIS — Z124 Encounter for screening for malignant neoplasm of cervix: Secondary | ICD-10-CM

## 2013-12-19 DIAGNOSIS — B9689 Other specified bacterial agents as the cause of diseases classified elsewhere: Secondary | ICD-10-CM

## 2013-12-19 DIAGNOSIS — Z01419 Encounter for gynecological examination (general) (routine) without abnormal findings: Secondary | ICD-10-CM | POA: Insufficient documentation

## 2013-12-19 DIAGNOSIS — N760 Acute vaginitis: Secondary | ICD-10-CM

## 2013-12-19 DIAGNOSIS — N912 Amenorrhea, unspecified: Secondary | ICD-10-CM

## 2013-12-19 DIAGNOSIS — Z1151 Encounter for screening for human papillomavirus (HPV): Secondary | ICD-10-CM | POA: Insufficient documentation

## 2013-12-19 DIAGNOSIS — A499 Bacterial infection, unspecified: Secondary | ICD-10-CM

## 2013-12-19 DIAGNOSIS — M62838 Other muscle spasm: Secondary | ICD-10-CM

## 2013-12-19 LAB — POCT URINE PREGNANCY: Preg Test, Ur: NEGATIVE

## 2013-12-19 MED ORDER — CYCLOBENZAPRINE HCL 5 MG PO TABS: 5.0000 mg | ORAL_TABLET | Freq: Three times a day (TID) | ORAL | Status: DC | PRN

## 2013-12-19 MED ORDER — METRONIDAZOLE 500 MG PO TABS: 500.0000 mg | ORAL_TABLET | Freq: Three times a day (TID) | ORAL | Status: DC

## 2013-12-19 NOTE — Progress Notes (Signed)
Lisa Newton is a 43 y.o. female who [facility].  No complaints today Feeling well.  Still w/ vaginal Dc as unable to afford metro gel. Discussed nature of BV. Pt able to affort pills.   Denies CP, SOB, palpitations, Syncope, HA, [facility] Mtc: PAP done today  The following portions of the patient's history were reviewed and updated as appropriate: allergies, current medications, past medical history, family and social history, and problem list.  Patient is a nonsmoker.  Past Medical History  Diagnosis Date  . Left ear hearing loss   . Depression 01/11/2012    ROS as above otherwise neg.    Medications reviewed. Current Outpatient Prescriptions  Medication Sig Dispense Refill  . cyclobenzaprine (FLEXERIL) 5 MG tablet Take 1 tablet (5 mg total) by mouth 3 (three) times daily as needed for muscle spasms.  30 tablet  0  . metroNIDAZOLE (FLAGYL) 500 MG tablet Take 1 tablet (500 mg total) by mouth 3 (three) times daily.  21 tablet  0  . omeprazole (PRILOSEC) 20 MG capsule Take 1 capsule (20 mg total) by mouth daily.  60 capsule  1  . promethazine (PHENERGAN) 25 MG tablet Take 1 tablet (25 mg total) by mouth every 8 (eight) hours as needed for nausea.  20 tablet  0  . sucralfate (CARAFATE) 1 GM/10ML suspension Take 10 mLs (1 g total) by mouth 4 (four) times daily.  420 mL  0   No current facility-administered medications for this visit.    Exam: BP 129/86  Pulse 67  Temp(Src) 97.8 F (36.6 C) (Oral)  Ht 5\' 6"  (1.676 m)  Wt 180 lb (81.647 kg)  BMI 29.07 kg/m2  LMP 11/20/2013 Gen: Well NAD HEENT: EOMI,  MMM Lungs: CTABL Nl WOB Heart: RRR no MRG Abd: NABS, NT, ND GU: vaginal walls well rugated. Labia nml. Milky white discharge and odor. Miinimal pink DC from cervical os.  Exts: Non edematous BL  LE, warm and well perfused.   No results found for this or any previous visit (from the past 72 hour(s)).  A/P (as seen in Problem list)  No  problem-specific assessment & plan notes found for this encounter.  No change in current regimens. Reviewed adn approve.  Metro 500 BID starting today F/u PCP PRN

## 2013-12-19 NOTE — Patient Instructions (Addendum)
You are doing well overall Please start the metro pills from the pharmacy or consider using intravaginal yogurt to treat the BV. Please do not drink alcohol while taking this medicine Please follow up with your regular doctor with any other concerns.  Have a great day.

## 2014-03-18 ENCOUNTER — Ambulatory Visit (INDEPENDENT_AMBULATORY_CARE_PROVIDER_SITE_OTHER): Payer: Medicaid Other | Admitting: *Deleted

## 2014-03-18 DIAGNOSIS — Z111 Encounter for screening for respiratory tuberculosis: Secondary | ICD-10-CM

## 2014-03-20 ENCOUNTER — Encounter: Payer: Self-pay | Admitting: *Deleted

## 2014-03-20 ENCOUNTER — Ambulatory Visit (INDEPENDENT_AMBULATORY_CARE_PROVIDER_SITE_OTHER): Payer: Medicaid Other | Admitting: *Deleted

## 2014-03-20 DIAGNOSIS — Z111 Encounter for screening for respiratory tuberculosis: Secondary | ICD-10-CM

## 2014-03-20 LAB — TB SKIN TEST
Induration: 0 mm
TB Skin Test: NEGATIVE

## 2014-03-27 ENCOUNTER — Other Ambulatory Visit: Payer: Self-pay | Admitting: Family Medicine

## 2014-05-20 ENCOUNTER — Other Ambulatory Visit (HOSPITAL_COMMUNITY)
Admission: RE | Admit: 2014-05-20 | Discharge: 2014-05-20 | Disposition: A | Discharge status: Home / Self Care | Payer: Medicaid Other | Source: Ambulatory Visit | Attending: Family Medicine | Admitting: Family Medicine

## 2014-05-20 ENCOUNTER — Ambulatory Visit (INDEPENDENT_AMBULATORY_CARE_PROVIDER_SITE_OTHER): Payer: Medicaid Other | Admitting: Family Medicine

## 2014-05-20 ENCOUNTER — Telehealth: Payer: Self-pay | Admitting: *Deleted

## 2014-05-20 ENCOUNTER — Encounter: Payer: Self-pay | Admitting: Family Medicine

## 2014-05-20 VITALS — BP 127/86 | HR 72 | Temp 98.8°F | Wt 177.0 lb

## 2014-05-20 DIAGNOSIS — A499 Bacterial infection, unspecified: Secondary | ICD-10-CM

## 2014-05-20 DIAGNOSIS — B9689 Other specified bacterial agents as the cause of diseases classified elsewhere: Secondary | ICD-10-CM

## 2014-05-20 DIAGNOSIS — Z0000 Encounter for general adult medical examination without abnormal findings: Secondary | ICD-10-CM

## 2014-05-20 DIAGNOSIS — N760 Acute vaginitis: Secondary | ICD-10-CM

## 2014-05-20 DIAGNOSIS — Z113 Encounter for screening for infections with a predominantly sexual mode of transmission: Secondary | ICD-10-CM | POA: Insufficient documentation

## 2014-05-20 DIAGNOSIS — N898 Other specified noninflammatory disorders of vagina: Secondary | ICD-10-CM

## 2014-05-20 LAB — POCT WET PREP (WET MOUNT)
CLUE CELLS WET PREP WHIFF POC: POSITIVE
WBC, Wet Prep HPF POC: >20

## 2014-05-20 MED ORDER — METRONIDAZOLE 500 MG PO TABS: 500.0000 mg | ORAL_TABLET | Freq: Three times a day (TID) | ORAL | Status: DC

## 2014-05-20 NOTE — Telephone Encounter (Signed)
LM for patient to call back to get results of wet prep.  Jazmin Hartsell,CMA

## 2014-05-20 NOTE — Assessment & Plan Note (Signed)
A: Recurrent BV.   P: - Flagyl 500mg  TID x5 days - Monitor for triggers - will check for GC/CT as well today - F/u prn

## 2014-05-20 NOTE — Progress Notes (Signed)
Patient ID: Chriss Czar, female   DOB: [birth year], 43 y.o.   MRN: [id number]    Subjective: HPI: Patient is a 43 y.o. female presenting to clinic today for follow up appointment. Concerns today include needs form completed for work and vaginal discharge  1. 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 Doing well. No concerns.   2. Vaginal discharge- Started 3 weeks ago. No new sexual partners, husband currently incarcerated. White discharge, some odor. She has recurrent BV mostly untreated due to financial constraints. She also states she is using a new soap. Denies vaginal itching, no fevers, normal periods.  History Reviewed: Never smoker.  ROS: Please see HPI above.  Objective: Office vital signs reviewed. BP 127/86  Pulse 72  Temp(Src) 98.8 F (37.1 C) (Oral)  Wt 177 lb (80.287 kg)  LMP 05/11/2014  Physical Examination:  General: Awake, alert. NAD HEENT: Atraumatic, normocephalic. MMM. Abd: Soft, nontender GU: No external lesions or skin breakdown. Thin, gray, malodorous discharge noted. No tenderness Neuro: Strength and sensation grossly intact   Assessment: 43 y.o. female follow up  Plan: See Problem List and After Visit Summary

## 2014-05-20 NOTE — Assessment & Plan Note (Signed)
A: Exam updated in January. 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

## 2014-05-20 NOTE — Patient Instructions (Signed)
I will let you know if your labs show anything different.  Take care! Reah Justo M. Ledarrius Beauchaine, M.D.

## 2014-05-22 NOTE — Telephone Encounter (Signed)
Pt called back about lab results Pt is at work and is concerned about playing "phone tag"

## 2014-05-22 NOTE — Telephone Encounter (Signed)
Pt is aware of results.  She will pick up medication.  Doreen Garretson,CMA

## 2014-08-25 ENCOUNTER — Encounter (HOSPITAL_COMMUNITY): Payer: Self-pay | Admitting: Emergency Medicine

## 2014-08-25 ENCOUNTER — Emergency Department (HOSPITAL_COMMUNITY)
Admission: EM | Admit: 2014-08-25 | Discharge: 2014-08-25 | Disposition: A | Discharge status: Home / Self Care | Payer: Medicaid Other | Attending: Emergency Medicine | Admitting: Emergency Medicine

## 2014-08-25 ENCOUNTER — Emergency Department (HOSPITAL_COMMUNITY)
Admission: EM | Admit: 2014-08-25 | Discharge: 2014-08-25 | Discharge status: Against Medical Advice | Payer: Medicaid Other

## 2014-08-25 DIAGNOSIS — H9190 Unspecified hearing loss, unspecified ear: Secondary | ICD-10-CM | POA: Insufficient documentation

## 2014-08-25 DIAGNOSIS — M79609 Pain in unspecified limb: Secondary | ICD-10-CM

## 2014-08-25 DIAGNOSIS — Z791 Long term (current) use of non-steroidal anti-inflammatories (NSAID): Secondary | ICD-10-CM | POA: Diagnosis not present

## 2014-08-25 DIAGNOSIS — Z8659 Personal history of other mental and behavioral disorders: Secondary | ICD-10-CM | POA: Diagnosis not present

## 2014-08-25 DIAGNOSIS — Z79899 Other long term (current) drug therapy: Secondary | ICD-10-CM | POA: Diagnosis not present

## 2014-08-25 DIAGNOSIS — M7918 Myalgia, other site: Secondary | ICD-10-CM

## 2014-08-25 MED ORDER — IBUPROFEN 800 MG PO TABS: 800.0000 mg | ORAL_TABLET | Freq: Once | ORAL | Status: DC

## 2014-08-25 MED ORDER — IBUPROFEN 800 MG PO TABS: 800.0000 mg | ORAL_TABLET | Freq: Three times a day (TID) | ORAL | Status: DC

## 2014-08-25 MED ORDER — ORPHENADRINE CITRATE ER 100 MG PO TB12: 100.0000 mg | ORAL_TABLET | Freq: Two times a day (BID) | ORAL | Status: DC

## 2014-08-25 MED ORDER — ORPHENADRINE CITRATE ER 100 MG PO TB12
100.0000 mg | ORAL_TABLET | Freq: Two times a day (BID) | ORAL | Status: DC
Start: 2014-08-25 — End: 2014-08-25

## 2014-08-25 MED ORDER — ORPHENADRINE CITRATE ER 100 MG PO TB12
100.0000 mg | ORAL_TABLET | Freq: Once | ORAL | Status: DC
  Filled 2014-08-25: qty 1

## 2014-08-25 NOTE — Progress Notes (Signed)
VASCULAR LAB PRELIMINARY  PRELIMINARY  PRELIMINARY  PRELIMINARY  Right lower extremity venous duplex  completed.    Preliminary report:  Right:  No evidence of DVT, superficial thrombosis, or Baker's cyst.   Naw Lasala, RVT 08/25/2014, 7:15 PM

## 2014-08-25 NOTE — Discharge Instructions (Signed)

## 2014-08-25 NOTE — ED Notes (Signed)
Pt requested pain medication before being discharged, RN got verbal order from MD. Pt left before medications could be administered.

## 2014-08-25 NOTE — ED Notes (Signed)
Per ultrasound tech, pt negative for DVT

## 2014-08-25 NOTE — ED Notes (Signed)
Pt c/o R posterior leg pain x 1 week.  Pain score 10/10.  Pt reports taking ibuprofen and muscle relaxers w/o relief.  Pt reports pain "moves around" from upper to lower.  No swelling, redness, or warmth noted.

## 2014-08-25 NOTE — ED Provider Notes (Signed)
CSN: [id number]     Arrival date & time 08/25/14  1523 History   First MD Initiated Contact with Patient 08/25/14 1640     Chief Complaint  Patient presents with  . Leg Pain     (Consider location/radiation/quality/duration/timing/severity/associated sxs/prior Treatment) HPI Patient complains of posterior leg pain for approximately a week duration. She denies any prior injury. She has been trying anti-inflammatories and muscle relaxers without relief. The patient fell he localizes her right posterior slightly lateral thigh as the source of most of the pain. However she reports that it seems to move up and down her leg and sometimes she does notice it to be in her calf and up closer to her buttock. Patient denies any numbness in association with this. It is significantly a cramping and then other times sharp type of pain. Pressure over the area exacerbates it. Patient does not have any associated shortness of breath or chest pain fevers chills or swelling.  Past Medical History  Diagnosis Date  . Left ear hearing loss   . Depression 01/11/2012   Past Surgical History  Procedure Laterality Date  . Left ear surgery      ruptured TM   Family History  Problem Relation Age of Onset  . Cancer Mother     lung cancer  . Diabetes Father   . Hypertension Father    History  Substance Use Topics  . Smoking status: Never Smoker   . Smokeless tobacco: Not on file  . Alcohol Use: No   OB History   Grav Para Term Preterm Abortions TAB SAB Ect Mult Living                 Review of Systems    Allergies  Pollen extract  Home Medications   Prior to Admission medications   Medication Sig Start Date End Date Taking? Authorizing Provider  cyclobenzaprine (FLEXERIL) 5 MG tablet Take 1 tablet (5 mg total) by mouth 3 (three) times daily as needed for muscle spasms. 12/19/13  Yes Ozella Rocks, MD  ibuprofen (ADVIL,MOTRIN) 800 MG tablet Take 800 mg by mouth every 8 (eight) hours as needed.    Yes Historical Provider, MD  ibuprofen (ADVIL,MOTRIN) 800 MG tablet Take 1 tablet (800 mg total) by mouth 3 (three) times daily. 08/25/14   Arby Barrette, MD  orphenadrine (NORFLEX) 100 MG tablet Take 1 tablet (100 mg total) by mouth 2 (two) times daily. 08/25/14   Arby Barrette, MD   BP 152/101  Pulse 71  Temp(Src) 98.3 F (36.8 C) (Oral)  Resp 16  Wt 165 lb (74.844 kg)  SpO2 100% Physical Exam  Constitutional: She is oriented to person, place, and time. She appears well-developed and well-nourished.  HENT:  Head: Normocephalic and atraumatic.  Neck: Neck supple.  Cardiovascular: Normal rate, regular rhythm, normal heart sounds and intact distal pulses.   Pulmonary/Chest: Effort normal and breath sounds normal.  Abdominal: Soft. Bowel sounds are normal. She exhibits no distension. There is no tenderness.  Musculoskeletal: Normal range of motion. She exhibits no edema.  Visual special lower extremities is normal. They are symmetric in appearance there is no perceivable soft tissue swelling or redness in the thigh or the lower leg. Patient endorses significant pain to palpation at approximately the midpoint of her posterior thigh. There is no palpable abnormality no crepitus no induration no rash. The calf is soft she does endorse some tenderness to palpation however. The popliteal fossa is soft and nontender. There is no  knee effusion. Digitalis pedis pulses 2+ and symmetric in both feet. Patient can sit stand and weight-bear appropriately. The patient can sit stand weight-bear and ambulate appropriately. With a mildly antalgic gait  Neurological: She is alert and oriented to person, place, and time. She has normal strength. Coordination normal. GCS eye subscore is 4. GCS verbal subscore is 5. GCS motor subscore is 6.  Skin: Skin is warm, dry and intact.  Psychiatric: She has a normal mood and affect.    ED Course  Procedures (including critical care time) Labs Review Labs Reviewed - No  data to display  Imaging Review No results found.   EKG Interpretation None      MDM   Final diagnoses:  Muscle ache of extremity   At this point in time the ultrasound is negative for DVT. Findings are consistent with muscular skeletal pain. The patient can continue to use anti-inflammatories and we will try an alternative muscle relaxer and follow up with her family physician. This point time there are no constitutional findings or physical exam findings to suggest other etiology.    Arby Barrette, MD 08/25/14 [phone]

## 2014-09-01 ENCOUNTER — Ambulatory Visit: Payer: Medicaid Other | Admitting: Family Medicine

## 2014-09-10 ENCOUNTER — Ambulatory Visit: Payer: Medicaid Other | Admitting: Family Medicine

## 2014-09-12 ENCOUNTER — Ambulatory Visit: Payer: Medicaid Other | Admitting: Family Medicine

## 2014-10-02 ENCOUNTER — Ambulatory Visit (INDEPENDENT_AMBULATORY_CARE_PROVIDER_SITE_OTHER): Payer: Medicaid Other | Admitting: Family Medicine

## 2014-10-02 ENCOUNTER — Encounter: Payer: Self-pay | Admitting: Family Medicine

## 2014-10-02 VITALS — BP 163/94 | HR 66 | Temp 98.1°F | Ht 66.0 in | Wt 164.8 lb

## 2014-10-02 DIAGNOSIS — R12 Heartburn: Secondary | ICD-10-CM

## 2014-10-02 DIAGNOSIS — R079 Chest pain, unspecified: Secondary | ICD-10-CM

## 2014-10-02 LAB — CBC WITH DIFFERENTIAL/PLATELET
BASOS ABS: 0.1 10*3/uL (ref 0.0–0.1)
Basophils Relative: 1 % (ref 0–1)
Eosinophils Absolute: 0.1 10*3/uL (ref 0.0–0.7)
Eosinophils Relative: 2 % (ref 0–5)
HEMATOCRIT: 40.3 % (ref 36.0–46.0)
HEMOGLOBIN: 13.6 g/dL (ref 12.0–15.0)
LYMPHS PCT: 45 % (ref 12–46)
Lymphs Abs: 3.3 10*3/uL (ref 0.7–4.0)
MCH: 31 pg (ref 26.0–34.0)
MCHC: 33.7 g/dL (ref 30.0–36.0)
MCV: 91.8 fL (ref 78.0–100.0)
MONO ABS: 0.5 10*3/uL (ref 0.1–1.0)
Monocytes Relative: 7 % (ref 3–12)
NEUTROS ABS: 3.3 10*3/uL (ref 1.7–7.7)
Neutrophils Relative %: 45 % (ref 43–77)
Platelets: 254 10*3/uL (ref 150–400)
RBC: 4.39 MIL/uL (ref 3.87–5.11)
RDW: 13.3 % (ref 11.5–15.5)
WBC: 7.4 10*3/uL (ref 4.0–10.5)

## 2014-10-02 LAB — BASIC METABOLIC PANEL
BUN: 7 mg/dL (ref 6–23)
CALCIUM: 9.5 mg/dL (ref 8.4–10.5)
CO2: 27 mEq/L (ref 19–32)
Chloride: 102 mEq/L (ref 96–112)
Creat: 0.74 mg/dL (ref 0.50–1.10)
GLUCOSE: 78 mg/dL (ref 70–99)
Potassium: 3.7 mEq/L (ref 3.5–5.3)
SODIUM: 136 meq/L (ref 135–145)

## 2014-10-02 MED ORDER — OMEPRAZOLE 20 MG PO CPDR: 20.0000 mg | DELAYED_RELEASE_CAPSULE | Freq: Every day | ORAL | Status: DC

## 2014-10-02 NOTE — Progress Notes (Signed)
Patient ID: Lisa Newton, female   DOB: 1971/08/31, 43 y.o.   MRN: [id number]   HPI  Patient presents today for breast pain paragraph right wrist pain started this morning. She states that it began while walking it lasted a few seconds and is sharp without radiation. She states that increased with inspiration or she raised her arm above her head. She states that the pain is similar to midsternal pain that she had previously which was suspected to be reflux related.  She states that one month ago she had right posterior leg pain which was attributed to muscle spasms in the ER. She had a prescription given for muscle relaxer which he never used.   she's not had a mammogram but wants to get one now. She denies any nipple discharge skin changes in the breast or nipple retraction. She also denies lumps or bumps in the breast.  She denies fever, chills, sweats, decreased by mouth intake, or other symptoms of illness.   Smoking status noted ROS: Per HPI  Objective: BP 163/94  Pulse 66  Temp(Src) 98.1 F (36.7 C) (Oral)  Ht 5\' 6"  (1.676 m)  Wt 164 lb 12.8 oz (74.753 kg)  BMI 26.61 kg/m2  LMP 09/23/2014 Gen: NAD, alert, cooperative with exam HEENT: NCAT CV: RRR, good S1/S2, no murmur, no tenderness to palpation of anterior chest wall Resp: CTABL, no wheezes, non-labored Abd: SNTND, BS present, no guarding or organomegaly Ext: No edema, warm Neuro: Alert and oriented, No gross deficits Breast exam: Normal breast tissue on palpation, no skin changes, no nipple discharge, no nipple retraction  Assessment and plan:  Chest pain Noticed as R breast pain Atypical, unlikely cardiac or resp Labs Improved with belching, start omeprazole X 4 weeks and f/u with PCP  Most likely etiology is MSK or GERD related symptoms.  Red flags reviewed in detail    Heartburn Likely source of pain, restart PPI

## 2014-10-02 NOTE — Patient Instructions (Signed)
Great to meet you  Schedule your mammogram  Try the acid medicine  Come back in 1 month for follow up with your doctor  Chest Pain (Nonspecific) It is often hard to give a specific diagnosis for the cause of chest pain. There is always a chance that your pain could be related to something serious, such as a heart attack or a blood clot in the lungs. You need to follow up with your health care provider for further evaluation. CAUSES   Heartburn.  Pneumonia or bronchitis.  Anxiety or stress.  Inflammation around your heart (pericarditis) or lung (pleuritis or pleurisy).  A blood clot in the lung.  A collapsed lung (pneumothorax). It can develop suddenly on its own (spontaneous pneumothorax) or from trauma to the chest.  Shingles infection (herpes zoster virus). The chest wall is composed of bones, muscles, and cartilage. Any of these can be the source of the pain.  The bones can be bruised by injury.  The muscles or cartilage can be strained by coughing or overwork.  The cartilage can be affected by inflammation and become sore (costochondritis). DIAGNOSIS  Lab tests or other studies may be needed to find the cause of your pain. Your health care provider may have you take a test called an ambulatory electrocardiogram (ECG). An ECG records your heartbeat patterns over a 24-hour period. You may also have other tests, such as:  Transthoracic echocardiogram (TTE). During echocardiography, sound waves are used to evaluate how blood flows through your heart.  Transesophageal echocardiogram (TEE).  Cardiac monitoring. This allows your health care provider to monitor your heart rate and rhythm in real time.  Holter monitor. This is a portable device that records your heartbeat and can help diagnose heart arrhythmias. It allows your health care provider to track your heart activity for several days, if needed.  Stress tests by exercise or by giving medicine that makes the heart beat  faster. TREATMENT   Treatment depends on what may be causing your chest pain. Treatment may include:  Acid blockers for heartburn.  Anti-inflammatory medicine.  Pain medicine for inflammatory conditions.  Antibiotics if an infection is present.  You may be advised to change lifestyle habits. This includes stopping smoking and avoiding alcohol, caffeine, and chocolate.  You may be advised to keep your head raised (elevated) when sleeping. This reduces the chance of acid going backward from your stomach into your esophagus. Most of the time, nonspecific chest pain will improve within 2-3 days with rest and mild pain medicine.  HOME CARE INSTRUCTIONS   If antibiotics were prescribed, take them as directed. Finish them even if you start to feel better.  For the next few days, avoid physical activities that bring on chest pain. Continue physical activities as directed.  Do not use any tobacco products, including cigarettes, chewing tobacco, or electronic cigarettes.  Avoid drinking alcohol.  Only take medicine as directed by your health care provider.  Follow your health care provider's suggestions for further testing if your chest pain does not go away.  Keep any follow-up appointments you made. If you do not go to an appointment, you could develop lasting (chronic) problems with pain. If there is any problem keeping an appointment, call to reschedule. SEEK MEDICAL CARE IF:   Your chest pain does not go away, even after treatment.  You have a rash with blisters on your chest.  You have a fever. SEEK IMMEDIATE MEDICAL CARE IF:   You have increased chest pain or  pain that spreads to your arm, neck, jaw, back, or abdomen.  You have shortness of breath.  You have an increasing cough, or you cough up blood.  You have severe back or abdominal pain.  You feel nauseous or vomit.  You have severe weakness.  You faint.  You have chills. This is an emergency. Do not wait to  see if the pain will go away. Get medical help at once. Call your local emergency services (911 in [place] Do not drive yourself to the hospital. MAKE SURE YOU:   Understand these instructions.  Will watch your condition.  Will get help right away if you are not doing well or get worse. Document Released: 09/07/2005 Document Revised: 12/03/2013 Document Reviewed: 07/03/2008 [facility] Patient Information 2015 [employer], [place]. This information is not intended to replace advice given to you by your health care provider. Make sure you discuss any questions you have with your health care provider.

## 2014-10-02 NOTE — Assessment & Plan Note (Signed)
Likely source of pain, restart PPI

## 2014-10-02 NOTE — Assessment & Plan Note (Addendum)
Noticed as R breast pain Atypical, unlikely cardiac or resp Labs Improved with belching, start omeprazole X 4 weeks and f/u with PCP  Most likely etiology is MSK or GERD related symptoms.  Red flags reviewed in detail

## 2014-10-03 ENCOUNTER — Encounter: Payer: Self-pay | Admitting: Family Medicine

## 2014-11-29 ENCOUNTER — Emergency Department (HOSPITAL_COMMUNITY)
Admission: EM | Admit: 2014-11-29 | Discharge: 2014-11-29 | Disposition: A | Discharge status: Home / Self Care | Payer: Medicaid Other | Attending: Emergency Medicine | Admitting: Emergency Medicine

## 2014-11-29 ENCOUNTER — Encounter (HOSPITAL_COMMUNITY): Payer: Self-pay | Admitting: Emergency Medicine

## 2014-11-29 ENCOUNTER — Emergency Department (HOSPITAL_COMMUNITY): Payer: Medicaid Other

## 2014-11-29 DIAGNOSIS — Z3202 Encounter for pregnancy test, result negative: Secondary | ICD-10-CM | POA: Diagnosis not present

## 2014-11-29 DIAGNOSIS — R1084 Generalized abdominal pain: Secondary | ICD-10-CM | POA: Insufficient documentation

## 2014-11-29 DIAGNOSIS — R109 Unspecified abdominal pain: Secondary | ICD-10-CM

## 2014-11-29 DIAGNOSIS — Z8659 Personal history of other mental and behavioral disorders: Secondary | ICD-10-CM | POA: Insufficient documentation

## 2014-11-29 DIAGNOSIS — H9192 Unspecified hearing loss, left ear: Secondary | ICD-10-CM | POA: Insufficient documentation

## 2014-11-29 DIAGNOSIS — R197 Diarrhea, unspecified: Secondary | ICD-10-CM

## 2014-11-29 DIAGNOSIS — R112 Nausea with vomiting, unspecified: Secondary | ICD-10-CM | POA: Diagnosis not present

## 2014-11-29 DIAGNOSIS — H9202 Otalgia, left ear: Secondary | ICD-10-CM | POA: Insufficient documentation

## 2014-11-29 DIAGNOSIS — R1110 Vomiting, unspecified: Secondary | ICD-10-CM | POA: Diagnosis present

## 2014-11-29 LAB — COMPREHENSIVE METABOLIC PANEL
ALK PHOS: 86 U/L (ref 39–117)
ALT: 13 U/L (ref 0–35)
AST: 18 U/L (ref 0–37)
Albumin: 4.3 g/dL (ref 3.5–5.2)
Anion gap: 14 (ref 5–15)
BUN: 10 mg/dL (ref 6–23)
CALCIUM: 9.3 mg/dL (ref 8.4–10.5)
CO2: 22 meq/L (ref 19–32)
Chloride: 101 mEq/L (ref 96–112)
Creatinine, Ser: 0.56 mg/dL (ref 0.50–1.10)
Glucose, Bld: 100 mg/dL — ABNORMAL HIGH (ref 70–99)
Potassium: 3.7 mEq/L (ref 3.7–5.3)
SODIUM: 137 meq/L (ref 137–147)
TOTAL PROTEIN: 8.1 g/dL (ref 6.0–8.3)
Total Bilirubin: 1 mg/dL (ref 0.3–1.2)

## 2014-11-29 LAB — URINALYSIS, ROUTINE W REFLEX MICROSCOPIC
Bilirubin Urine: NEGATIVE
GLUCOSE, UA: NEGATIVE mg/dL
Hgb urine dipstick: NEGATIVE
Ketones, ur: NEGATIVE mg/dL
Leukocytes, UA: NEGATIVE
Nitrite: NEGATIVE
PH: 8 (ref 5.0–8.0)
Protein, ur: NEGATIVE mg/dL
SPECIFIC GRAVITY, URINE: 1.025 (ref 1.005–1.030)
Urobilinogen, UA: 1 mg/dL (ref 0.0–1.0)

## 2014-11-29 LAB — CBC WITH DIFFERENTIAL/PLATELET
BASOS ABS: 0 10*3/uL (ref 0.0–0.1)
Basophils Relative: 0 % (ref 0–1)
EOS PCT: 1 % (ref 0–5)
Eosinophils Absolute: 0.1 10*3/uL (ref 0.0–0.7)
HCT: 40.4 % (ref 36.0–46.0)
Hemoglobin: 14.1 g/dL (ref 12.0–15.0)
Lymphocytes Relative: 10 % — ABNORMAL LOW (ref 12–46)
Lymphs Abs: 1 10*3/uL (ref 0.7–4.0)
MCH: 32 pg (ref 26.0–34.0)
MCHC: 34.9 g/dL (ref 30.0–36.0)
MCV: 91.8 fL (ref 78.0–100.0)
Monocytes Absolute: 0.6 10*3/uL (ref 0.1–1.0)
Monocytes Relative: 5 % (ref 3–12)
Neutro Abs: 8.5 10*3/uL — ABNORMAL HIGH (ref 1.7–7.7)
Neutrophils Relative %: 84 % — ABNORMAL HIGH (ref 43–77)
PLATELETS: 211 10*3/uL (ref 150–400)
RBC: 4.4 MIL/uL (ref 3.87–5.11)
RDW: 12.5 % (ref 11.5–15.5)
WBC: 10.3 10*3/uL (ref 4.0–10.5)

## 2014-11-29 LAB — LIPASE, BLOOD: Lipase: 23 U/L (ref 11–59)

## 2014-11-29 LAB — PREGNANCY, URINE: Preg Test, Ur: NEGATIVE

## 2014-11-29 MED ORDER — SODIUM CHLORIDE 0.9 % IV SOLN
1000.0000 mL | Freq: Once | INTRAVENOUS | Status: AC
  Administered 2014-11-29: 1000 mL via INTRAVENOUS

## 2014-11-29 MED ORDER — HYDROCODONE-ACETAMINOPHEN 5-325 MG PO TABS: 2.0000 | ORAL_TABLET | ORAL | Status: DC | PRN

## 2014-11-29 MED ORDER — IOHEXOL 300 MG/ML  SOLN
25.0000 mL | Freq: Once | INTRAMUSCULAR | Status: AC | PRN
  Administered 2014-11-29: 25 mL via ORAL

## 2014-11-29 MED ORDER — PROMETHAZINE HCL 25 MG PO TABS: 25.0000 mg | ORAL_TABLET | Freq: Four times a day (QID) | ORAL | Status: DC | PRN

## 2014-11-29 MED ORDER — DIPHENHYDRAMINE HCL 50 MG/ML IJ SOLN
25.0000 mg | Freq: Once | INTRAMUSCULAR | Status: AC
  Administered 2014-11-29: 25 mg via INTRAVENOUS
  Filled 2014-11-29: qty 1

## 2014-11-29 MED ORDER — METOCLOPRAMIDE HCL 5 MG/ML IJ SOLN
10.0000 mg | Freq: Once | INTRAMUSCULAR | Status: AC
  Administered 2014-11-29: 10 mg via INTRAVENOUS
  Filled 2014-11-29: qty 2

## 2014-11-29 MED ORDER — SODIUM CHLORIDE 0.9 % IV SOLN
1000.0000 mL | INTRAVENOUS | Status: DC
  Administered 2014-11-29: 1000 mL via INTRAVENOUS

## 2014-11-29 MED ORDER — ONDANSETRON HCL 4 MG/2ML IJ SOLN
4.0000 mg | Freq: Once | INTRAMUSCULAR | Status: AC
  Administered 2014-11-29: 4 mg via INTRAVENOUS
  Filled 2014-11-29: qty 2

## 2014-11-29 MED ORDER — IOHEXOL 300 MG/ML  SOLN
100.0000 mL | Freq: Once | INTRAMUSCULAR | Status: AC | PRN
  Administered 2014-11-29: 100 mL via INTRAVENOUS

## 2014-11-29 NOTE — ED Notes (Signed)
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 Generalized body aches. Pepto bismol, ibuprofen not effective.

## 2014-11-29 NOTE — ED Provider Notes (Signed)
CSN: [id number]     Arrival date & time 11/29/14  0357 History   First MD Initiated Contact with Patient 11/29/14 0449     Chief Complaint  Patient presents with  . Emesis  . Generalized Body Aches     (Consider location/radiation/quality/duration/timing/severity/associated sxs/prior Treatment) HPI  Patient reports yesterday morning on December 18 she started not feeling well. She states she had some mild diffuse lower abdominal pain all day. She thought maybe she was ill from the [place] she ate the night before however her daughter ate from the same take out and is not sick. She states she ate 2 bagels with butter on them about lunch time and then at 1 PM ate a salad. She went home about 3 PM she did not feel well with nausea and abdominal discomfort and she started vomiting about 5 PM. She states she's vomited about 7 times and has had about 4 episodes of watery diarrhea. She denies fever but she does feel dizzy and has some lightheadedness. She has been belching without reflux symptoms. She denies any dysuria or frequency or urinary problems. She reports she has been having left ear pain for the past week. She reports surgery in the ear 3 times in the past. 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  [facility]  Past Medical History  Diagnosis Date  . Left ear hearing loss   . Depression 01/11/2012   Past Surgical History  Procedure Laterality Date  . Left ear surgery      ruptured TM   Family History  Problem Relation Age of Onset  . Cancer Mother     lung cancer  . Diabetes Father   . Hypertension Father    History  Substance Use Topics  . Smoking status: Never Smoker   . Smokeless tobacco: Not on file  . Alcohol Use: No   Employed Lives at home Lives with daughter  OB History    No data available     Review of Systems  All other systems reviewed and  are negative.     Allergies  Pollen extract  Home Medications   Prior to Admission medications   Medication Sig Start Date End Date Taking? Authorizing Provider  ibuprofen (ADVIL,MOTRIN) 200 MG tablet Take 200 mg by mouth every 6 (six) hours as needed for fever.   Yes Historical Provider, MD  cyclobenzaprine (FLEXERIL) 5 MG tablet Take 1 tablet (5 mg total) by mouth 3 (three) times daily as needed for muscle spasms. Patient not taking: Reported on 11/29/2014 12/19/13   Ozella Rocksavid J Merrell, MD  ibuprofen (ADVIL,MOTRIN) 800 MG tablet Take 800 mg by mouth every 8 (eight) hours as needed.    Historical Provider, MD  ibuprofen (ADVIL,MOTRIN) 800 MG tablet Take 1 tablet (800 mg total) by mouth 3 (three) times daily. Patient not taking: Reported on 11/29/2014 08/25/14   Arby BarretteMarcy Pfeiffer, MD  omeprazole (PRILOSEC) 20 MG capsule Take 1 capsule (20 mg total) by mouth daily. Patient not taking: Reported on 11/29/2014 10/02/14   Elenora GammaSamuel L Bradshaw, MD  orphenadrine (NORFLEX) 100 MG tablet Take 1 tablet (100 mg total) by mouth 2 (two) times daily. Patient not taking: Reported on 11/29/2014 08/25/14   Arby BarretteMarcy Pfeiffer, MD   BP 114/60 mmHg  Pulse 79  Temp(Src) 97.8 F (36.6 C) (Oral)  Resp 16  Ht 5'  4" (1.626 m)  Wt 160 lb (72.576 kg)  BMI 27.45 kg/m2  SpO2 100%  LMP 11/22/2014 (Approximate)  Vital signs normal   Physical Exam  Constitutional: She is oriented to person, place, and time. She appears well-developed and well-nourished.  Non-toxic appearance. She does not appear ill. She appears distressed.  HENT:  Head: Normocephalic and atraumatic.  Right Ear: External ear normal.  Left Ear: External ear normal.  Nose: Nose normal. No mucosal edema or rhinorrhea.  Mouth/Throat: Mucous membranes are normal. No dental abscesses or uvula swelling.  Dry mucus membranes patient's left ear canal is normal, her tympanic membrane is not inflamed, she appears to have white material behind her TM, questionable  cholesteatoma   Eyes: Conjunctivae and EOM are normal. Pupils are equal, round, and reactive to light.  Neck: Normal range of motion and full passive range of motion without pain. Neck supple.  Cardiovascular: Normal rate, regular rhythm and normal heart sounds.  Exam reveals no gallop and no friction rub.   No murmur heard. Pulmonary/Chest: Effort normal and breath sounds normal. No respiratory distress. She has no wheezes. She has no rhonchi. She has no rales. She exhibits no tenderness and no crepitus.  Abdominal: Soft. Normal appearance and bowel sounds are normal. She exhibits no distension. There is generalized tenderness. There is no rebound and no guarding.  Musculoskeletal: Normal range of motion. She exhibits no edema or tenderness.  Moves all extremities well.   Neurological: She is alert and oriented to person, place, and time. She has normal strength. No cranial nerve deficit.  Skin: Skin is warm, dry and intact. No rash noted. No erythema. No pallor.  Psychiatric: She has a normal mood and affect. Her speech is normal and behavior is normal. Her mood appears not anxious.  Nursing note and vitals reviewed.   ED Course  Procedures (including critical care time)  Medications  0.9 %  sodium chloride infusion (not administered)    Followed by  0.9 %  sodium chloride infusion (1,000 mLs Intravenous New Bag/Given 11/29/14 0622)    Followed by  0.9 %  sodium chloride infusion (1,000 mLs Intravenous New Bag/Given 11/29/14 0622)  ondansetron (ZOFRAN) injection 4 mg (4 mg Intravenous Given 11/29/14 0457)  metoCLOPramide (REGLAN) injection 10 mg (10 mg Intravenous Given 11/29/14 0622)  diphenhydrAMINE (BENADRYL) injection 25 mg (25 mg Intravenous Given 11/29/14 0622)    Patient was feeling better after I rechecked her. her IVs had stopped and were checked by nursing staff. When I rechecked her again at 740 her IVs were almost in, 2 L normal saline, patient states when she drank fluids  however it made her upper abdomen feel worse and her nausea feel worse. This point CT of the abdomen was ordered. Patient will be left changes shift to get CT results.   Labs Review Results for orders placed or performed during the hospital encounter of 11/29/14  CBC with Differential  Result Value Ref Range   WBC 10.3 4.0 - 10.5 K/uL   RBC 4.40 3.87 - 5.11 MIL/uL   Hemoglobin 14.1 12.0 - 15.0 g/dL   HCT 16.1 09.6 - 04.5 %   MCV 91.8 78.0 - 100.0 fL   MCH 32.0 26.0 - 34.0 pg   MCHC 34.9 30.0 - 36.0 g/dL   RDW 40.9 81.1 - 91.4 %   Platelets 211 150 - 400 K/uL   Neutrophils Relative % 84 (H) 43 - 77 %   Neutro Abs 8.5 (H) 1.7 - 7.7  K/uL   Lymphocytes Relative 10 (L) 12 - 46 %   Lymphs Abs 1.0 0.7 - 4.0 K/uL   Monocytes Relative 5 3 - 12 %   Monocytes Absolute 0.6 0.1 - 1.0 K/uL   Eosinophils Relative 1 0 - 5 %   Eosinophils Absolute 0.1 0.0 - 0.7 K/uL   Basophils Relative 0 0 - 1 %   Basophils Absolute 0.0 0.0 - 0.1 K/uL  Comprehensive metabolic panel  Result Value Ref Range   Sodium 137 137 - 147 mEq/L   Potassium 3.7 3.7 - 5.3 mEq/L   Chloride 101 96 - 112 mEq/L   CO2 22 19 - 32 mEq/L   Glucose, Bld 100 (H) 70 - 99 mg/dL   BUN 10 6 - 23 mg/dL   Creatinine, Ser 1.470.56 0.50 - 1.10 mg/dL   Calcium 9.3 8.4 - 82.910.5 mg/dL   Total Protein 8.1 6.0 - 8.3 g/dL   Albumin 4.3 3.5 - 5.2 g/dL   AST 18 0 - 37 U/L   ALT 13 0 - 35 U/L   Alkaline Phosphatase 86 39 - 117 U/L   Total Bilirubin 1.0 0.3 - 1.2 mg/dL   GFR calc non Af Amer >90 >90 mL/min   GFR calc Af Amer >90 >90 mL/min   Anion gap 14 5 - 15  Lipase, blood  Result Value Ref Range   Lipase 23 11 - 59 U/L  Pregnancy, urine  Result Value Ref Range   Preg Test, Ur NEGATIVE NEGATIVE  Urinalysis, Routine w reflex microscopic  Result Value Ref Range   Color, Urine YELLOW YELLOW   APPearance CLEAR CLEAR   Specific Gravity, Urine 1.025 1.005 - 1.030   pH 8.0 5.0 - 8.0   Glucose, UA NEGATIVE NEGATIVE mg/dL   Hgb urine dipstick  NEGATIVE NEGATIVE   Bilirubin Urine NEGATIVE NEGATIVE   Ketones, ur NEGATIVE NEGATIVE mg/dL   Protein, ur NEGATIVE NEGATIVE mg/dL   Urobilinogen, UA 1.0 0.0 - 1.0 mg/dL   Nitrite NEGATIVE NEGATIVE   Leukocytes, UA NEGATIVE NEGATIVE   Laboratory interpretation all normal     Imaging Review No results found.   EKG Interpretation None      MDM   Final diagnoses:  Nausea vomiting and diarrhea  Abdominal pain, unspecified abdominal location    Disposition pending   Devoria AlbeIva Stephenson Cichy, MD, Armando GangFACEP     Ward GivensIva L Kamiyah Kindel, MD 11/29/14 [phone]

## 2014-11-29 NOTE — Discharge Instructions (Signed)

## 2014-12-03 ENCOUNTER — Encounter: Payer: Self-pay | Admitting: Family Medicine

## 2014-12-03 ENCOUNTER — Ambulatory Visit (INDEPENDENT_AMBULATORY_CARE_PROVIDER_SITE_OTHER): Payer: Medicaid Other | Admitting: *Deleted

## 2014-12-03 ENCOUNTER — Ambulatory Visit (INDEPENDENT_AMBULATORY_CARE_PROVIDER_SITE_OTHER): Payer: Medicaid Other | Admitting: Family Medicine

## 2014-12-03 VITALS — BP 137/86 | HR 79 | Temp 98.6°F | Wt 169.0 lb

## 2014-12-03 DIAGNOSIS — H6690 Otitis media, unspecified, unspecified ear: Secondary | ICD-10-CM | POA: Insufficient documentation

## 2014-12-03 DIAGNOSIS — R079 Chest pain, unspecified: Secondary | ICD-10-CM | POA: Diagnosis present

## 2014-12-03 DIAGNOSIS — R12 Heartburn: Secondary | ICD-10-CM | POA: Diagnosis not present

## 2014-12-03 DIAGNOSIS — H9190 Unspecified hearing loss, unspecified ear: Secondary | ICD-10-CM | POA: Insufficient documentation

## 2014-12-03 DIAGNOSIS — Z23 Encounter for immunization: Secondary | ICD-10-CM

## 2014-12-03 DIAGNOSIS — H9192 Unspecified hearing loss, left ear: Secondary | ICD-10-CM

## 2014-12-03 DIAGNOSIS — H66005 Acute suppurative otitis media without spontaneous rupture of ear drum, recurrent, left ear: Secondary | ICD-10-CM

## 2014-12-03 MED ORDER — ANTIPYRINE-BENZOCAINE 5.4-1.4 % OT SOLN: 3.0000 [drp] | Freq: Four times a day (QID) | OTIC | Status: DC | PRN

## 2014-12-03 MED ORDER — AMOXICILLIN 500 MG PO CAPS: 500.0000 mg | ORAL_CAPSULE | Freq: Three times a day (TID) | ORAL | Status: DC

## 2014-12-03 NOTE — Assessment & Plan Note (Signed)
Per history given due to recurrent ear infection in the past. Requesting ENT referral. Referral done today.

## 2014-12-03 NOTE — Assessment & Plan Note (Signed)
Amoxicillin prescribed for 10 days. Auralgan prn pain prescribed. Return precaution given.

## 2014-12-03 NOTE — Progress Notes (Signed)
Subjective:     Patient ID: Lisa Newton, female   DOB: April 01, 1971, 43 y.o.   MRN: [id number]  Otalgia  There is pain in the left (Problem with left ears for many years. Painin left ear for 2 wks now.) ear. This is a recurrent problem. The current episode started 1 to 4 weeks ago. The problem occurs constantly. The problem has been gradually worsening. There has been no fever. The pain is at a severity of 8/10. The pain is moderate. Associated symptoms include ear discharge. Pertinent negatives include no coughing, headaches or rash. Associated symptoms comments: A little throat pain, had N/V last week and was treated in the hospital for it.. She has tried NSAIDs for the symptoms. The treatment provided mild relief. Her past medical history is significant for hearing loss. Multiple ear ear infections in the past, she also had surgery, she had repair of her ear drum.  Hearing loss: She has loss of hearing in her left ear, was seen in the past by ENT but never followed up, she will like referral for follow up. Current Outpatient Prescriptions on File Prior to Visit  Medication Sig Dispense Refill  . ibuprofen (ADVIL,MOTRIN) 800 MG tablet Take 800 mg by mouth every 8 (eight) hours as needed.    [name]. omeprazole (PRILOSEC) 20 MG capsule Take 1 capsule (20 mg total) by mouth daily. 30 capsule 3  . cyclobenzaprine (FLEXERIL) 5 MG tablet Take 1 tablet (5 mg total) by mouth 3 (three) times daily as needed for muscle spasms. (Patient not taking: Reported on 11/29/2014) 30 tablet 0  . HYDROcodone-acetaminophen (NORCO/VICODIN) 5-325 MG per tablet Take 2 tablets by mouth every 4 (four) hours as needed. (Patient not taking: Reported on 12/03/2014) 10 tablet 0  . orphenadrine (NORFLEX) 100 MG tablet Take 1 tablet (100 mg total) by mouth 2 (two) times daily. (Patient not taking: Reported on 11/29/2014) 30 tablet 0  . promethazine (PHENERGAN) 25 MG tablet Take 1 tablet (25 mg total) by mouth every 6 (six) hours as  needed for nausea or vomiting. (Patient not taking: Reported on 12/03/2014) 30 tablet 0   No current facility-administered medications on file prior to visit.   Past Medical History  Diagnosis Date  . Left ear hearing loss   . Depression 01/11/2012     Review of Systems  HENT: Positive for ear discharge and ear pain.   Respiratory: Negative for cough.   Skin: Negative for rash.  Neurological: Negative for headaches.   Filed Vitals:   12/03/14 1450  BP: 137/86  Pulse: 79  Temp: 98.6 F (37 C)  TempSrc: Oral  Weight: 169 lb (76.658 kg)       Objective:   Physical Exam  Constitutional: She appears well-developed. No distress.  HENT:  Right Ear: Tympanic membrane, external ear and ear canal normal.  Left Ear: There is drainage and tenderness. Tympanic membrane is bulging. Tympanic membrane is not perforated and not erythematous.  No middle ear effusion.  Ears:  Cardiovascular: Normal rate, regular rhythm, normal heart sounds and intact distal pulses.   No murmur heard. Pulmonary/Chest: Effort normal and breath sounds normal. No respiratory distress. She has no wheezes.  Nursing note and vitals reviewed.      Assessment:     Otitis media  Hearing loss.    Plan:     Check problem list.

## 2014-12-03 NOTE — Patient Instructions (Signed)
It was nice to see you today Ms. Maniscalco, I am sorry about your ear pain, your exam shows that you have inner ear infection, I will like for you to start Amoxicillin for 10 days. You may use auralgan drops for pain or Ibuprofen prn. F/U with your PCP soon if no improvement.    Otitis Media Otitis media is redness, soreness, and puffiness (swelling) in the space just behind your eardrum (middle ear). It may be caused by allergies or infection. It often happens along with a cold. HOME CARE  Take your medicine as told. Finish it even if you start to feel better.  Only take over-the-counter or prescription medicines for pain, discomfort, or fever as told by your doctor.  Follow up with your doctor as told. GET HELP IF:  You have otitis media only in one ear, or bleeding from your nose, or both.  You notice a lump on your neck.  You are not getting better in 3-5 days.  You feel worse instead of better. GET HELP RIGHT AWAY IF:   You have pain that is not helped with medicine.  You have puffiness, redness, or pain around your ear.  You get a stiff neck.  You cannot move part of your face (paralysis).  You notice that the bone behind your ear hurts when you touch it. MAKE SURE YOU:   Understand these instructions.  Will watch your condition.  Will get help right away if you are not doing well or get worse. Document Released: 05/16/2008 Document Revised: 12/03/2013 Document Reviewed: 06/25/2013 [facility] Patient Information 2015 [place], [place]. This information is not intended to replace advice given to you by your health care provider. Make sure you discuss any questions you have with your health care provider.

## 2014-12-09 ENCOUNTER — Telehealth: Payer: Self-pay | Admitting: *Deleted

## 2014-12-09 NOTE — Telephone Encounter (Signed)
Pt called and states that pharmacy told her that they don't make auralgan anymore. She would like to know if anything else can be called in. Also the abx gave her a yeast infection and would like some diflucan called into pharmacy for her too. Her ear is feeling some better and has an appt with the ENT on 12/23/2014. Jazmin Hartsell,CMA 

## 2014-12-09 NOTE — Telephone Encounter (Signed)
There are no appts until next.  Jazmin Hartsell,CMA

## 2014-12-09 NOTE — Telephone Encounter (Signed)
Patient may use Ibuprofen or Tylenol for pain, have her come in to be tested for yeast for confirmation before treatment. Schedule follow up with PCP.

## 2014-12-10 ENCOUNTER — Other Ambulatory Visit: Payer: Self-pay | Admitting: Family Medicine

## 2014-12-10 ENCOUNTER — Telehealth: Payer: Self-pay | Admitting: Family Medicine

## 2014-12-10 MED ORDER — FLUCONAZOLE 150 MG PO TABS
150.0000 mg | ORAL_TABLET | Freq: Once | ORAL | Status: DC
Start: 2014-12-10 — End: 2015-04-07

## 2014-12-10 MED ORDER — HYDROCORTISONE-ACETIC ACID 1-2 % OT SOLN: 4.0000 [drp] | Freq: Four times a day (QID) | OTIC | Status: DC

## 2014-12-10 MED ORDER — ACETIC ACID 2 % OT SOLN: 4.0000 [drp] | Freq: Four times a day (QID) | OTIC | Status: DC

## 2014-12-10 NOTE — Telephone Encounter (Signed)
Pt is aware of this. Jazmin Hartsell,CMA  

## 2014-12-10 NOTE — Telephone Encounter (Signed)
Ordinary Acetic acid without hydrocortisone should be cheaper. I will send this in instead.

## 2014-12-10 NOTE — Telephone Encounter (Signed)
Ok, have her complete the Amoxicillin, I will call in acetic acid, may be that will help, please also encourage her to follow up with ENT as planned.  Thank you for your help Jazmin.

## 2014-12-10 NOTE — Telephone Encounter (Signed)
Pt is aware of diflucan and made an appt for next Thursday to follow up on her ear.  She would like to know if you can call in another drop for her ear since ibuprofen hasn't helped and the pain started flaring back up last night. Sophina Mitten,CMA

## 2014-12-10 NOTE — Telephone Encounter (Signed)
Tried to call pharmacy but couldn't get through.  Will forward to MD to see if she knows of a cheaper drop. Dewight Catino,CMA

## 2014-12-10 NOTE — Telephone Encounter (Signed)
Pt calling to let us know the drops that were prescribed are $94 and she cannot afford that, would like something else called in

## 2014-12-10 NOTE — Telephone Encounter (Signed)
Ok, I will send Diflucan. Have her schedule follow up with her PCP for reassessment. Please let her know. Thanks.

## 2014-12-10 NOTE — Telephone Encounter (Signed)
Pt called and states that pharmacy told her that they don't make auralgan anymore. She would like to know if anything else can be called in. Also the abx gave her a yeast infection and would like some diflucan called into pharmacy for her too. Her ear is feeling some better and has an appt with the ENT on 12/23/2014. Lisa Newton,CMA

## 2014-12-18 ENCOUNTER — Ambulatory Visit: Payer: Medicaid Other | Admitting: Family Medicine

## 2014-12-29 ENCOUNTER — Ambulatory Visit (INDEPENDENT_AMBULATORY_CARE_PROVIDER_SITE_OTHER): Payer: Medicaid Other | Admitting: Family Medicine

## 2014-12-29 ENCOUNTER — Encounter: Payer: Self-pay | Admitting: Family Medicine

## 2014-12-29 VITALS — BP 128/85 | HR 75 | Temp 98.3°F | Ht 66.0 in | Wt 173.4 lb

## 2014-12-29 DIAGNOSIS — H663X2 Other chronic suppurative otitis media, left ear: Secondary | ICD-10-CM

## 2014-12-29 DIAGNOSIS — H9202 Otalgia, left ear: Secondary | ICD-10-CM

## 2014-12-29 MED ORDER — CIPROFLOXACIN-HYDROCORTISONE 0.2-1 % OT SUSP: 3.0000 [drp] | Freq: Two times a day (BID) | OTIC | Status: DC

## 2014-12-29 MED ORDER — CIPROFLOXACIN-DEXAMETHASONE 0.3-0.1 % OT SUSP: 4.0000 [drp] | Freq: Two times a day (BID) | OTIC | Status: DC

## 2014-12-29 MED ORDER — NAPROXEN 500 MG PO TABS: 500.0000 mg | ORAL_TABLET | Freq: Two times a day (BID) | ORAL | Status: DC

## 2014-12-29 MED ORDER — TRAMADOL HCL 50 MG PO TABS: 50.0000 mg | ORAL_TABLET | Freq: Three times a day (TID) | ORAL | Status: DC | PRN

## 2014-12-29 NOTE — Patient Instructions (Signed)
Serous Otitis Media °Serous otitis media is fluid in the middle ear space. This space contains the bones for hearing and air. Air in the middle ear space helps to transmit sound.  °The air gets there through the eustachian tube. This tube goes from the back of the nose (nasopharynx) to the middle ear space. It keeps the pressure in the middle ear the same as the outside world. It also helps to drain fluid from the middle ear space. °CAUSES  °Serous otitis media occurs when the eustachian tube gets blocked. Blockage can come from: °· Ear infections. °· Colds and other upper respiratory infections. °· Allergies. °· Irritants such as cigarette smoke. °· Sudden changes in air pressure (such as descending in an airplane). °· Enlarged adenoids. °· A mass in the nasopharynx. °During colds and upper respiratory infections, the middle ear space can become temporarily filled with fluid. This can happen after an ear infection also. Once the infection clears, the fluid will generally drain out of the ear through the eustachian tube. If it does not, then serous otitis media occurs. °SIGNS AND SYMPTOMS  °· Hearing loss. °· A feeling of fullness in the ear, without pain. °· Young children may not show any symptoms but may show slight behavioral changes, such as agitation, ear pulling, or crying. °DIAGNOSIS  °Serous otitis media is diagnosed by an ear exam. Tests may be done to check on the movement of the eardrum. Hearing exams may also be done. °TREATMENT  °The fluid most often goes away without treatment. If allergy is the cause, allergy treatment may be helpful. Fluid that persists for several months may require minor surgery. A small tube is placed in the eardrum to: °· Drain the fluid. °· Restore the air in the middle ear space. °In certain situations, antibiotic medicines are used to avoid surgery. Surgery may be done to remove enlarged adenoids (if this is the cause). °HOME CARE INSTRUCTIONS  °· Keep children away from  tobacco smoke. °· Keep all follow-up visits as directed by your health care provider. °SEEK MEDICAL CARE IF:  °· Your hearing is not better in 3 months. °· Your hearing is worse. °· You have ear pain. °· You have drainage from the ear. °· You have dizziness. °· You have serous otitis media only in one ear or have any bleeding from your nose (epistaxis). °· You notice a lump on your neck. °MAKE SURE YOU: °· Understand these instructions.   °· Will watch your condition.   °· Will get help right away if you are not doing well or get worse.   °Document Released: 02/18/2004 Document Revised: 04/14/2014 Document Reviewed: 06/25/2013 °ExitCare® Patient Information ©2015 [employer], LLC. This information is not intended to replace advice given to you by your health care provider. Make sure you discuss any questions you have with your health care provider. ° °

## 2014-12-29 NOTE — Progress Notes (Signed)
  Subjective:     Lisa Newton is a 44 y.o. female who presents with ear pain and possible ear infection. Symptoms include: left ear drainage , left ear pain and plugged sensation in the left ear. Onset of symptoms was 1 month ago, and have waxed/wanned since that time.  She was seen for chronic suppurative OM at that time, started on Amox, and referral to ENT for this recalcitrant problem . Associated symptoms include: none.  Patient denies: fever , headache, post nasal drip and sore throat.  Pt awaiting ENT referral and denies any mastoid tenderness, trouble with head movement, or diplopia/tinnitus/blurred vision.   The following portions of the patient's history were reviewed and updated as appropriate: allergies, current medications, past family history, past medical history, past social history, past surgical history and problem list.  Review of Systems Pertinent items are noted in HPI.   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 L Cholesteatoma   Plan:    Treatment: Cipro Otic . OTC analgesia as needed. Tramadol and Naprosyn for pain Emphasized calling ENT again for her appointment, which she unfortunately was late and they no showed her for this.  Currently awaiting appt, as this is the definite tx with possible tube placement vs surgical intervention

## 2014-12-29 NOTE — Addendum Note (Signed)
Addended by: Gildardo CrankerHESS, Tamre Cass R on: 12/29/2014 04:00 PM   Modules accepted: Orders, Medications

## 2014-12-30 ENCOUNTER — Encounter: Payer: Self-pay | Admitting: *Deleted

## 2014-12-30 NOTE — Telephone Encounter (Signed)
This encounter was created in error - please disregard.

## 2015-01-13 ENCOUNTER — Other Ambulatory Visit: Payer: Self-pay | Admitting: Otolaryngology

## 2015-01-13 DIAGNOSIS — H6690 Otitis media, unspecified, unspecified ear: Secondary | ICD-10-CM

## 2015-01-13 DIAGNOSIS — Z9889 Other specified postprocedural states: Secondary | ICD-10-CM

## 2015-01-14 ENCOUNTER — Ambulatory Visit
Admission: RE | Admit: 2015-01-14 | Discharge: 2015-01-14 | Disposition: A | Discharge status: Home / Self Care | Payer: Medicaid Other | Source: Ambulatory Visit | Attending: Otolaryngology | Admitting: Otolaryngology

## 2015-01-14 DIAGNOSIS — H6690 Otitis media, unspecified, unspecified ear: Secondary | ICD-10-CM

## 2015-01-14 DIAGNOSIS — Z9889 Other specified postprocedural states: Secondary | ICD-10-CM

## 2015-04-07 ENCOUNTER — Ambulatory Visit (INDEPENDENT_AMBULATORY_CARE_PROVIDER_SITE_OTHER): Payer: Medicaid Other | Admitting: Family Medicine

## 2015-04-07 ENCOUNTER — Encounter: Payer: Self-pay | Admitting: Family Medicine

## 2015-04-07 VITALS — BP 153/108 | HR 81 | Temp 98.3°F | Ht 65.0 in | Wt 193.0 lb

## 2015-04-07 DIAGNOSIS — I82409 Acute embolism and thrombosis of unspecified deep veins of unspecified lower extremity: Secondary | ICD-10-CM | POA: Insufficient documentation

## 2015-04-07 DIAGNOSIS — H70209 Unspecified petrositis, unspecified ear: Secondary | ICD-10-CM

## 2015-04-07 DIAGNOSIS — H70202 Unspecified petrositis, left ear: Secondary | ICD-10-CM | POA: Diagnosis not present

## 2015-04-07 DIAGNOSIS — Z131 Encounter for screening for diabetes mellitus: Secondary | ICD-10-CM

## 2015-04-07 DIAGNOSIS — R12 Heartburn: Secondary | ICD-10-CM | POA: Diagnosis not present

## 2015-04-07 DIAGNOSIS — R202 Paresthesia of skin: Secondary | ICD-10-CM | POA: Diagnosis not present

## 2015-04-07 DIAGNOSIS — I82402 Acute embolism and thrombosis of unspecified deep veins of left lower extremity: Secondary | ICD-10-CM

## 2015-04-07 DIAGNOSIS — R079 Chest pain, unspecified: Secondary | ICD-10-CM | POA: Diagnosis not present

## 2015-04-07 DIAGNOSIS — I10 Essential (primary) hypertension: Secondary | ICD-10-CM | POA: Diagnosis not present

## 2015-04-07 HISTORY — DX: Unspecified petrositis, unspecified ear: H70.209

## 2015-04-07 LAB — CBC WITH DIFFERENTIAL/PLATELET
Basophils Absolute: 0.1 10*3/uL (ref 0.0–0.1)
Basophils Relative: 1 % (ref 0–1)
EOS ABS: 0.6 10*3/uL (ref 0.0–0.7)
EOS PCT: 10 % — AB (ref 0–5)
HEMATOCRIT: 39.6 % (ref 36.0–46.0)
Hemoglobin: 13.3 g/dL (ref 12.0–15.0)
LYMPHS ABS: 2.2 10*3/uL (ref 0.7–4.0)
LYMPHS PCT: 36 % (ref 12–46)
MCH: 30.9 pg (ref 26.0–34.0)
MCHC: 33.6 g/dL (ref 30.0–36.0)
MCV: 92.1 fL (ref 78.0–100.0)
MPV: 11.3 fL (ref 8.6–12.4)
Monocytes Absolute: 0.6 10*3/uL (ref 0.1–1.0)
Monocytes Relative: 9 % (ref 3–12)
Neutro Abs: 2.7 10*3/uL (ref 1.7–7.7)
Neutrophils Relative %: 44 % (ref 43–77)
Platelets: 271 10*3/uL (ref 150–400)
RBC: 4.3 MIL/uL (ref 3.87–5.11)
RDW: 13.6 % (ref 11.5–15.5)
WBC: 6.2 10*3/uL (ref 4.0–10.5)

## 2015-04-07 LAB — POCT GLYCOSYLATED HEMOGLOBIN (HGB A1C): HEMOGLOBIN A1C: 5.4

## 2015-04-07 MED ORDER — OXYCODONE-ACETAMINOPHEN 5-325 MG PO TABS: 1.0000 | ORAL_TABLET | Freq: Three times a day (TID) | ORAL | Status: DC | PRN

## 2015-04-07 MED ORDER — HYDROCHLOROTHIAZIDE 12.5 MG PO TABS
12.5000 mg | ORAL_TABLET | Freq: Every day | ORAL | Status: DC
Start: 2015-04-07 — End: 2015-05-05

## 2015-04-07 MED ORDER — TRAMADOL HCL 50 MG PO TABS: 50.0000 mg | ORAL_TABLET | Freq: Two times a day (BID) | ORAL | Status: DC | PRN

## 2015-04-07 NOTE — Progress Notes (Signed)
Subjective:     Patient ID: Lisa Newton, female   DOB: July 01, 1971, 44 y.o.   MRN: [id number]  HPI  Paresthesia: C/Otingling sensation on the sole of her feet B/L ongoing for few months,on and off. Denies any limb weakness or numbness.This occurs mostly at night when she is lying down. She had been on Vancomycin and one other antibiotic which she cannot remember for her bone infection, otherwise no other change in medications. HTN:Her BP has been running high lately. She had issues with her BP in the past but stopped her medications since her BP returned back to normal. Denies any other concern. 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 She was recently diagnosed with bone infection for which she is on daily A/B. Her pain is about 10/10 in severity, does not improve with Tylenol, Ibuprofen. Tramadol helps only a little. She was given OxyContin for a short period which helped. She will need refill on this. DVT:[facility] for which she was placed on Coumadin. She stated she uses Coumadin 20 mg qd. Denies any bleeding. Her coumadin check is managed at [facility] and she get her INR checked by them every Monday.   Current Outpatient Prescriptions on File Prior to Visit  Medication Sig Dispense Refill  . naproxen (NAPROSYN) 500 MG tablet Take 1 tablet (500 mg total) by mouth 2 (two) times daily with a meal. (Patient not taking: Reported on 04/07/2015) 30 tablet 0  . omeprazole (PRILOSEC) 20 MG capsule Take 1 capsule (20 mg total) by mouth daily. (Patient not taking: Reported on 04/07/2015) 30 capsule 3  . traMADol (ULTRAM) 50 MG tablet Take 1 tablet (50 mg total) by mouth every 8 (eight) hours as needed. (Patient not taking: Reported on 04/07/2015) 30 tablet 0   No current facility-administered medications on file prior to visit.   Past Medical History  Diagnosis Date  . Left ear hearing loss   . Depression 01/11/2012       Review of Systems  HENT: Negative for facial swelling.        Facial pain  Respiratory: Negative.   Cardiovascular: Negative.   Gastrointestinal: Negative.   Genitourinary: Negative.   All other systems reviewed and are negative.  Filed Vitals:   04/07/15 0933 04/07/15 0958  BP: 161/106 153/108  Pulse: 81 81  Temp: 98.3 F (36.8 C)   TempSrc: Oral   Height: 5\' 5"  (1.651 m)   Weight: 193 lb (87.544 kg)        Objective:   Physical Exam  Constitutional: She is oriented to person, place, and time. She appears well-developed. No distress.  HENT:  Head: Normocephalic.  Eyes: Conjunctivae are normal. Pupils are equal, round, and reactive to light.  Cardiovascular: Normal rate, regular rhythm, normal heart sounds and intact distal pulses.   No murmur heard. Pulmonary/Chest: Effort normal and breath sounds normal. No respiratory distress. She has no wheezes. She exhibits no tenderness.  Abdominal: Soft. Bowel sounds are normal. She exhibits no distension and no mass. There is no tenderness.  Musculoskeletal: Normal range of motion. She exhibits no edema.       Arms: Neurological: She is alert and oriented to person, place, and time. No cranial nerve deficit.  Nursing note and vitals reviewed.      Assessment:     Paresthesia HTN Petrous bone osteomyelitis DVT     Plan:     Check problem list.

## 2015-04-07 NOTE — Assessment & Plan Note (Signed)
Patient started on HCTZ today. I will recheck BP in 4 wks. Continue home BP monitoring.

## 2015-04-07 NOTE — Patient Instructions (Signed)

## 2015-04-07 NOTE — Assessment & Plan Note (Signed)
[facility]. F/U prn.

## 2015-04-07 NOTE — Assessment & Plan Note (Signed)
[facility]. She will be on A/B for 6 wks. Continue F/U with ENT as scheduled. Tramadol refilled for pain. Percocet prescribed for few days. Patient advised not to continue Percocet due to high rate of dependency. She agreed with plan.

## 2015-04-07 NOTE — Assessment & Plan Note (Signed)
Etiology unclear. R/O Nutritional, metabolic,anemia or endocrine cause. Foot exam normal. TSH,A1C, VitB12,CBC,BMET checked today. Unlikely to be related to vancomycin. MVI may be considered. I will call her with results.

## 2015-04-08 ENCOUNTER — Telehealth: Payer: Self-pay | Admitting: Family Medicine

## 2015-04-08 LAB — BASIC METABOLIC PANEL
BUN: 9 mg/dL (ref 6–23)
CALCIUM: 9.2 mg/dL (ref 8.4–10.5)
CO2: 26 mEq/L (ref 19–32)
CREATININE: 0.66 mg/dL (ref 0.50–1.10)
Chloride: 103 mEq/L (ref 96–112)
Glucose, Bld: 69 mg/dL — ABNORMAL LOW (ref 70–99)
POTASSIUM: 3.7 meq/L (ref 3.5–5.3)
Sodium: 139 mEq/L (ref 135–145)

## 2015-04-08 LAB — VITAMIN B12: Vitamin B-12: 339 pg/mL (ref 211–911)

## 2015-04-08 LAB — TSH: TSH: 4.315 u[IU]/mL (ref 0.350–4.500)

## 2015-04-08 NOTE — Telephone Encounter (Signed)
Test result discussed with patient. Unclear why she is having paresthesia. ?? A/B she is on. I recommended watching for improvement once she completes her A/B. If no improvement we will try Gabapentin.

## 2015-04-24 ENCOUNTER — Telehealth: Payer: Self-pay | Admitting: Family Medicine

## 2015-04-24 NOTE — Telephone Encounter (Signed)
I spoke with patient, she mentioned she is having issues with her PICC line as well as the A/B she was placed on for her petrous bone infection by the ID physician. She was also informed she will need a skull surgery. Patient stated she will like to have a second opinion about this, I suggested she meet with her ID to clarify treatment plan and if still needing second opinion to let us know. It is unclear if another ID specialist will want to take over since she had not fully completed treatment with her current ID specialist. She will get back with me at follow up to discuss this further.

## 2015-04-24 NOTE — Telephone Encounter (Signed)
Pt asking to speak with her doctor, says there are some things she needs to tell her before her scheduled appt on 05/05/15.

## 2015-04-24 NOTE — Telephone Encounter (Signed)
Patient states that she is dealing with [facility] and is looking at having surgery on her ear?  She was just recently released from the hospital and would like to speak with you regarding a second opinion.  Patient would not give me anymore information. Asia Favata,CMA

## 2015-05-05 ENCOUNTER — Encounter: Payer: Self-pay | Admitting: Family Medicine

## 2015-05-05 ENCOUNTER — Ambulatory Visit (INDEPENDENT_AMBULATORY_CARE_PROVIDER_SITE_OTHER): Payer: Medicaid Other | Admitting: Family Medicine

## 2015-05-05 VITALS — BP 135/89 | HR 78 | Ht 65.0 in | Wt 187.0 lb

## 2015-05-05 DIAGNOSIS — R202 Paresthesia of skin: Secondary | ICD-10-CM

## 2015-05-05 DIAGNOSIS — R079 Chest pain, unspecified: Secondary | ICD-10-CM | POA: Diagnosis not present

## 2015-05-05 DIAGNOSIS — I10 Essential (primary) hypertension: Secondary | ICD-10-CM | POA: Diagnosis not present

## 2015-05-05 DIAGNOSIS — R12 Heartburn: Secondary | ICD-10-CM | POA: Diagnosis not present

## 2015-05-05 DIAGNOSIS — H70202 Unspecified petrositis, left ear: Secondary | ICD-10-CM

## 2015-05-05 MED ORDER — OXYCODONE-ACETAMINOPHEN 5-325 MG PO TABS: 1.0000 | ORAL_TABLET | Freq: Three times a day (TID) | ORAL | Status: DC | PRN

## 2015-05-05 NOTE — Patient Instructions (Signed)
It was nice seeing you today, your BP looks good today, I think its ok to stop hydrochlorothiazide for now. Please continue to monitor your BP at home and I will see you back after your surgery.  Good luck.

## 2015-05-05 NOTE — Assessment & Plan Note (Signed)
BP looks ok. May d/c HCTZ for now. Continue home BP monitoring.

## 2015-05-05 NOTE — Assessment & Plan Note (Signed)
Lab report from last visit reviewed and discussed with patient. It was essentially normal. She had improved since she stopped vancomycin. ?? If this is contributing to her symptoms. We will continue to monitor for improvement.

## 2015-05-05 NOTE — Progress Notes (Signed)
Subjective:     Patient ID: Lisa Newton, female   DOB: Jun 15, 1971, 44 y.o.   MRN: [id number]  HPI HTN: Here for follow up. She was started on HCTZ 12.5 mg during last visit but she stated her BP had been so good so she was told to stop meds by another provider. She denies any concern at the moment. Petrous osteomyelitis:Patient stated she is scheduled for petrous apicectomy, she d/c all A/B given by ID for her bone infection due to intolerance. She has an up coming appointment with her ID this week and ENT next week. She is needing something for pain till she sees her ENT. Ear pain is unbearable about 10/10 in severity without meds. Paresthesia: Of B/L LL. This has improved since she stopped taking her A/B.  Current Outpatient Prescriptions on File Prior to Visit  Medication Sig Dispense Refill  . omeprazole (PRILOSEC) 20 MG capsule Take 1 capsule (20 mg total) by mouth daily. (Patient not taking: Reported on 04/07/2015) 30 capsule 3  . traMADol (ULTRAM) 50 MG tablet Take 1 tablet (50 mg total) by mouth every 12 (twelve) hours as needed for moderate pain. (Patient not taking: Reported on 05/05/2015) 90 tablet 0   No current facility-administered medications on file prior to visit.   Past Medical History  Diagnosis Date  . Left ear hearing loss   . Depression 01/11/2012     Review of Systems  HENT: Positive for ear pain.   Respiratory: Negative.   Cardiovascular: Negative.   Gastrointestinal: Negative.   Genitourinary: Negative.   Neurological: Negative.   All other systems reviewed and are negative.  Filed Vitals:   05/05/15 1112 05/05/15 1129  BP: 146/90 135/89  Pulse: 78   Height: 5\' 5"  (1.651 m)   Weight: 187 lb (84.823 kg)        Objective:   Physical Exam  Constitutional: She appears well-developed. No distress.  HENT:  Head: Normocephalic.  Right Ear: Tympanic membrane normal. No drainage.  Left Ear: No drainage.  Ears:  Cardiovascular: Normal rate,  regular rhythm, normal heart sounds and intact distal pulses.   No murmur heard. Pulmonary/Chest: Effort normal and breath sounds normal. No respiratory distress. She has no wheezes.  Nursing note and vitals reviewed.      Assessment:     HTN: Petrous osteomyelitis: Paresthesia.    Plan:     Check problem list.

## 2015-05-05 NOTE — Assessment & Plan Note (Signed)
Currently off A/B. Advised follow up with ID soon to discuss use of A/B. She is scheduled for Apicectomy on June 17th. I gave refill of percocet for a short period till she sees ENT. I advised that I will not want to give percocet for her ear for more than necessary. She agreed with plan.

## 2015-05-20 ENCOUNTER — Telehealth: Payer: Self-pay | Admitting: Family Medicine

## 2015-05-20 NOTE — Telephone Encounter (Signed)
Will forward to the preceptor for today but patient will likely need to call the MD who did her surgery for this concern. Nirvi Boehler,CMA

## 2015-05-20 NOTE — Telephone Encounter (Signed)
Had surgery on her ear on Friday Has run out of her tramadol Also needs something to make her sleep Is already taking hydrocodone Please advise

## 2015-05-20 NOTE — Telephone Encounter (Signed)
Needs to be seen for sleep as this is a separate issue.  Should continue with hydrocodone for pain if she has this.  Can also use Alleve for pain relief in place of hydrocodone.  Two pills OTC is same as prescription strength Naprosyn, which she was previously prescribed.

## 2015-05-20 NOTE — Telephone Encounter (Signed)
Patient is aware of message.  She did make an appt to see pcp to discuss sleep concerns.  Advised her to turn off electronics in her room and also her phone before going to bed.  This way her brain can begin to wind down.  Pt voiced understanding.  She will contact surgeon if pain continues or gets worse. Judie Hollick,CMA

## 2015-06-02 ENCOUNTER — Ambulatory Visit (INDEPENDENT_AMBULATORY_CARE_PROVIDER_SITE_OTHER): Payer: Medicaid Other | Admitting: Family Medicine

## 2015-06-02 ENCOUNTER — Encounter: Payer: Self-pay | Admitting: Family Medicine

## 2015-06-02 VITALS — BP 135/90 | HR 99 | Temp 98.7°F | Ht 65.0 in | Wt 189.5 lb

## 2015-06-02 DIAGNOSIS — H9202 Otalgia, left ear: Secondary | ICD-10-CM

## 2015-06-02 DIAGNOSIS — F5102 Adjustment insomnia: Secondary | ICD-10-CM | POA: Diagnosis not present

## 2015-06-02 DIAGNOSIS — R079 Chest pain, unspecified: Secondary | ICD-10-CM | POA: Diagnosis present

## 2015-06-02 DIAGNOSIS — F99 Mental disorder, not otherwise specified: Secondary | ICD-10-CM | POA: Diagnosis not present

## 2015-06-02 DIAGNOSIS — R12 Heartburn: Secondary | ICD-10-CM | POA: Diagnosis not present

## 2015-06-02 MED ORDER — OXYCODONE-ACETAMINOPHEN 5-325 MG PO TABS: 1.0000 | ORAL_TABLET | Freq: Three times a day (TID) | ORAL | Status: DC | PRN

## 2015-06-02 MED ORDER — TRAMADOL HCL 50 MG PO TABS: 50.0000 mg | ORAL_TABLET | Freq: Two times a day (BID) | ORAL | Status: DC | PRN

## 2015-06-02 NOTE — Patient Instructions (Signed)
Insomnia Insomnia is frequent trouble falling and/or staying asleep. Insomnia can be a long term problem or a short term problem. Both are common. Insomnia can be a short term problem when the wakefulness is related to a certain stress or worry. Long term insomnia is often related to ongoing stress during waking hours and/or poor sleeping habits. Overtime, sleep deprivation itself can make the problem worse. Every little thing feels more severe because you are overtired and your ability to cope is decreased. CAUSES   Stress, anxiety, and depression.  Poor sleeping habits.  Distractions such as TV in the bedroom.  Naps close to bedtime.  Engaging in emotionally charged conversations before bed.  Technical reading before sleep.  Alcohol and other sedatives. They may make the problem worse. They can hurt normal sleep patterns and normal dream activity.  Stimulants such as caffeine for several hours prior to bedtime.  Pain syndromes and shortness of breath can cause insomnia.  Exercise late at night.  Changing time zones may cause sleeping problems (jet lag). It is sometimes helpful to have someone observe your sleeping patterns. They should look for periods of not breathing during the night (sleep apnea). They should also look to see how long those periods last. If you live alone or observers are uncertain, you can also be observed at a sleep clinic where your sleep patterns will be professionally monitored. Sleep apnea requires a checkup and treatment. Give your caregivers your medical history. Give your caregivers observations your family has made about your sleep.  SYMPTOMS   Not feeling rested in the morning.  Anxiety and restlessness at bedtime.  Difficulty falling and staying asleep. TREATMENT   Your caregiver may prescribe treatment for an underlying medical disorders. Your caregiver can give advice or help if you are using alcohol or other drugs for self-medication. Treatment  of underlying problems will usually eliminate insomnia problems.  Medications can be prescribed for short time use. They are generally not recommended for lengthy use.  Over-the-counter sleep medicines are not recommended for lengthy use. They can be habit forming.  You can promote easier sleeping by making lifestyle changes such as:  Using relaxation techniques that help with breathing and reduce muscle tension.  Exercising earlier in the day.  Changing your diet and the time of your last meal. No night time snacks.  Establish a regular time to go to bed.  Counseling can help with stressful problems and worry.  Soothing music and white noise may be helpful if there are background noises you cannot remove.  Stop tedious detailed work at least one hour before bedtime. HOME CARE INSTRUCTIONS   Keep a diary. Inform your caregiver about your progress. This includes any medication side effects. See your caregiver regularly. Take note of:  Times when you are asleep.  Times when you are awake during the night.  The quality of your sleep.  How you feel the next day. This information will help your caregiver care for you.  Get out of bed if you are still awake after 15 minutes. Read or do some quiet activity. Keep the lights down. Wait until you feel sleepy and go back to bed.  Keep regular sleeping and waking hours. Avoid naps.  Exercise regularly.  Avoid distractions at bedtime. Distractions include watching television or engaging in any intense or detailed activity like attempting to balance the household checkbook.  Develop a bedtime ritual. Keep a familiar routine of bathing, brushing your teeth, climbing into bed at the same   time each night, listening to soothing music. Routines increase the success of falling to sleep faster.  Use relaxation techniques. This can be using breathing and muscle tension release routines. It can also include visualizing peaceful scenes. You can  also help control troubling or intruding thoughts by keeping your mind occupied with boring or repetitive thoughts like the old concept of counting sheep. You can make it more creative like imagining planting one beautiful flower after another in your backyard garden.  During your day, work to eliminate stress. When this is not possible use some of the previous suggestions to help reduce the anxiety that accompanies stressful situations. MAKE SURE YOU:   Understand these instructions.  Will watch your condition.  Will get help right away if you are not doing well or get worse. Document Released: 11/25/2000 Document Revised: 02/20/2012 Document Reviewed: 12/26/2007 ExitCare Patient Information 2015 [employer]. This information is not intended to replace advice given to you by your health care provider. Make sure you discuss any questions you have with your health care provider.  

## 2015-06-02 NOTE — Progress Notes (Signed)
Subjective:     Patient ID: Lisa Newton, female   DOB: 07-21-1971, 44 y.o.   MRN: [id number]  HPI  Ear pain: Patient had surgery of her left ear and petrous bone 2 wks ago, she has been in pain since then. She was d/c home on Tramadol prn and Motrin. Motrin does not help and she is now out of her Tramadol. Here pain is about 8/10 in severity, this prevents her from having a good rest at night. She denies any ear discharge. She had tried contacting her ENT and still waiting for them to return her call to schedule follow up appointment. Sleep problem: Patient c/o difficulty sleeping at night since she had her surgery. This she feel is related to her ear pain. Emotional issue: Patient stated she has been dealing with a lot of stress since Jan of 2016, in addition to her ear infection, after her wedding her husband got incarcerated, she had been apart from her husband since then. She stated there are other issues she is dealing with. She denies feeling of depression, she denies suicidal ideation. She stated she is a strong woman trying to deal with all this problem. She receives emotional support from her pastor support and her best friend.  Current Outpatient Prescriptions on File Prior to Visit  Medication Sig Dispense Refill  . omeprazole (PRILOSEC) 20 MG capsule Take 1 capsule (20 mg total) by mouth daily. 30 capsule 3   No current facility-administered medications on file prior to visit.   Past Medical History  Diagnosis Date  . Left ear hearing loss   . Depression 01/11/2012     Review of Systems  HENT: Positive for ear pain. Negative for ear discharge and hearing loss.   Respiratory: Negative.   Cardiovascular: Negative.   Gastrointestinal: Negative.   Psychiatric/Behavioral: Positive for sleep disturbance. Negative for suicidal ideas. The patient is not nervous/anxious.   All other systems reviewed and are negative.      Filed Vitals:   06/02/15 0931  BP: 135/90    Pulse: 99  Temp: 98.7 F (37.1 C)  TempSrc: Oral  Height: 5\' 5"  (1.651 m)  Weight: 189 lb 8 oz (85.957 kg)    Objective:   Physical Exam  Constitutional: She appears well-developed. No distress.  HENT:  Right Ear: Tympanic membrane, external ear and ear canal normal. No drainage, swelling or tenderness.  Left Ear: There is tenderness. No drainage.  Ears:  Cardiovascular: Normal rate, regular rhythm and normal heart sounds.   No murmur heard. Pulmonary/Chest: Effort normal and breath sounds normal. No respiratory distress. She has no wheezes.  Abdominal: Soft. Bowel sounds are normal. There is no tenderness.  Psychiatric: Her speech is normal and behavior is normal. Her mood appears not anxious. Cognition and memory are normal. She expresses no homicidal and no suicidal ideation. She expresses no suicidal plans and no homicidal plans.  Mood down  Nursing note and vitals reviewed.      Assessment:     Ear pain:  Insomnia Emotional problem      Plan:     Check problem list.

## 2015-06-03 DIAGNOSIS — H9202 Otalgia, left ear: Secondary | ICD-10-CM | POA: Insufficient documentation

## 2015-06-03 DIAGNOSIS — F99 Mental disorder, not otherwise specified: Secondary | ICD-10-CM | POA: Insufficient documentation

## 2015-06-03 DIAGNOSIS — F5102 Adjustment insomnia: Secondary | ICD-10-CM | POA: Insufficient documentation

## 2015-06-03 NOTE — Assessment & Plan Note (Signed)
S/P Petrous bone apicectomy 2 wks ago. Tramadol refilled. Advised she will not be getting any more tramadol refill after now with the hope that her pain will continue to get better in the next few weeks. She is advised follow up with her ENT soon.

## 2015-06-03 NOTE — Assessment & Plan Note (Signed)
Counseling done. I recommended psychology counseling but she declined. She stated she is getting pastoral support as well as support from her best friend. She is otherwise stable and not suicidal. Follow-up precaution discussed.

## 2015-06-03 NOTE — Assessment & Plan Note (Signed)
Likely secondary to ear pain. Pain control as above. Tylenol PM recommended prn insomnia. Advised not to take with Tramadol. Return precaution discussed.

## 2015-06-21 ENCOUNTER — Emergency Department (HOSPITAL_COMMUNITY)
Admission: EM | Admit: 2015-06-21 | Discharge: 2015-06-21 | Disposition: A | Discharge status: Home / Self Care | Payer: Medicaid Other | Attending: Emergency Medicine | Admitting: Emergency Medicine

## 2015-06-21 ENCOUNTER — Encounter (HOSPITAL_COMMUNITY): Payer: Self-pay | Admitting: Emergency Medicine

## 2015-06-21 DIAGNOSIS — R220 Localized swelling, mass and lump, head: Secondary | ICD-10-CM | POA: Diagnosis not present

## 2015-06-21 DIAGNOSIS — Z91040 Latex allergy status: Secondary | ICD-10-CM | POA: Insufficient documentation

## 2015-06-21 DIAGNOSIS — H9202 Otalgia, left ear: Secondary | ICD-10-CM | POA: Diagnosis not present

## 2015-06-21 DIAGNOSIS — H938X2 Other specified disorders of left ear: Secondary | ICD-10-CM | POA: Diagnosis present

## 2015-06-21 DIAGNOSIS — Z79899 Other long term (current) drug therapy: Secondary | ICD-10-CM | POA: Diagnosis not present

## 2015-06-21 DIAGNOSIS — Z9889 Other specified postprocedural states: Secondary | ICD-10-CM | POA: Insufficient documentation

## 2015-06-21 DIAGNOSIS — Z8659 Personal history of other mental and behavioral disorders: Secondary | ICD-10-CM | POA: Diagnosis not present

## 2015-06-21 NOTE — ED Provider Notes (Signed)
CSN: [id number]     Arrival date & time 06/21/15  1147 History   First MD Initiated Contact with Patient 06/21/15 1151     Chief Complaint  Patient presents with  . Ear Problem     (Consider location/radiation/quality/duration/timing/severity/associated sxs/prior Treatment) HPI Comments: Pt comes in with request to have the packing or whatever they put in her ear at ent a weak ago after the she had surgery on June 3. She states that it is itchy and she feels pressure. Denies fever or problems swallowing. Has tried benadryl without relief  The history is provided by the patient. No language interpreter was used.    Past Medical History  Diagnosis Date  . Left ear hearing loss   . Depression 01/11/2012   Past Surgical History  Procedure Laterality Date  . Left ear surgery      ruptured TM   Family History  Problem Relation Age of Onset  . Cancer Mother     lung cancer  . Diabetes Father   . Hypertension Father    History  Substance Use Topics  . Smoking status: Never Smoker   . Smokeless tobacco: Not on file  . Alcohol Use: No   OB History    No data available     Review of Systems  All other systems reviewed and are negative.     Allergies  Latex and Pollen extract  Home Medications   Prior to Admission medications   Medication Sig Start Date End Date Taking? Authorizing Provider  omeprazole (PRILOSEC) 20 MG capsule Take 1 capsule (20 mg total) by mouth daily. 10/02/14   Elenora GammaSamuel L Bradshaw, MD  oxyCODONE-acetaminophen (PERCOCET/ROXICET) 5-325 MG per tablet Take 1 tablet by mouth every 8 (eight) hours as needed for severe pain. 06/02/15   Doreene ElandKehinde T Eniola, MD  traMADol (ULTRAM) 50 MG tablet Take 1 tablet (50 mg total) by mouth every 12 (twelve) hours as needed for moderate pain. 06/02/15   Doreene ElandKehinde T Eniola, MD   BP 143/89 mmHg  Pulse 77  Temp(Src) 98.9 F (37.2 C) (Oral)  Resp 20  SpO2 100%  LMP 05/26/2015 (Approximate) Physical Exam  Constitutional: She  appears well-developed and well-nourished.  HENT:  Right Ear: External ear normal.  White foam noted in external ear. Mild swelling noted around the area. No redness or warmth noted to the ear  Cardiovascular: Normal rate and regular rhythm.   Pulmonary/Chest: Effort normal and breath sounds normal.  Nursing note and vitals reviewed.   ED Course  Procedures (including critical care time) Labs Review Labs Reviewed - No data to display  Imaging Review No results found.   EKG Interpretation None      MDM   Final diagnoses:  Otalgia of left ear    Discussed with pt that she probably should see ent tomorrow as there doesn't seem to be inflammatory process and she may risk the canal closing. Pt is in agreement    Teressa LowerVrinda Nai Borromeo, NP 06/21/15 [id number]  Eber HongBrian Miller, MD 06/21/15 [phone]

## 2015-06-21 NOTE — Discharge Instructions (Signed)
Follow up with ent tomorrow Otalgia The most common reason for this in children is an infection of the middle ear. Pain from the middle ear is usually caused by a build-up of fluid and pressure behind the eardrum. Pain from an earache can be sharp, dull, or burning. The pain may be temporary or constant. The middle ear is connected to the nasal passages by a short narrow tube called the Eustachian tube. The Eustachian tube allows fluid to drain out of the middle ear, and helps keep the pressure in your ear equalized. CAUSES  A cold or allergy can block the Eustachian tube with inflammation and the build-up of secretions. This is especially likely in small children, because their Eustachian tube is shorter and more horizontal. When the Eustachian tube closes, the normal flow of fluid from the middle ear is stopped. Fluid can accumulate and cause stuffiness, pain, hearing loss, and an ear infection if germs start growing in this area. SYMPTOMS  The symptoms of an ear infection may include fever, ear pain, fussiness, increased crying, and irritability. Many children will have temporary and minor hearing loss during and right after an ear infection. Permanent hearing loss is rare, but the risk increases the more infections a child has. Other causes of ear pain include retained water in the outer ear canal from swimming and bathing. Ear pain in adults is less likely to be from an ear infection. Ear pain may be referred from other locations. Referred pain may be from the joint between your jaw and the skull. It may also come from a tooth problem or problems in the neck. Other causes of ear pain include:  A foreign body in the ear.  Outer ear infection.  Sinus infections.  Impacted ear wax.  Ear injury.  Arthritis of the jaw or TMJ problems.  Middle ear infection.  Tooth infections.  Sore throat with pain to the ears. DIAGNOSIS  Your caregiver can usually make the diagnosis by examining you.  Sometimes other special studies, including x-rays and lab work may be necessary. TREATMENT   If antibiotics were prescribed, use them as directed and finish them even if you or your child's symptoms seem to be improved.  Sometimes PE tubes are needed in children. These are little plastic tubes which are put into the eardrum during a simple surgical procedure. They allow fluid to drain easier and allow the pressure in the middle ear to equalize. This helps relieve the ear pain caused by pressure changes. HOME CARE INSTRUCTIONS   Only take over-the-counter or prescription medicines for pain, discomfort, or fever as directed by your caregiver. DO NOT GIVE CHILDREN ASPIRIN because of the association of Reye's Syndrome in children taking aspirin.  Use a cold pack applied to the outer ear for 15-20 minutes, 03-04 times per day or as needed may reduce pain. Do not apply ice directly to the skin. You may cause frost bite.  Over-the-counter ear drops used as directed may be effective. Your caregiver may sometimes prescribe ear drops.  Resting in an upright position may help reduce pressure in the middle ear and relieve pain.  Ear pain caused by rapidly descending from high altitudes can be relieved by swallowing or chewing gum. Allowing infants to suck on a bottle during airplane travel can help.  Do not smoke in the house or near children. If you are unable to quit smoking, smoke outside.  Control allergies. SEEK IMMEDIATE MEDICAL CARE IF:   You or your child are becoming  sicker.  Pain or fever relief is not obtained with medicine.  You or your child's symptoms (pain, fever, or irritability) do not improve within 24 to 48 hours or as instructed.  Severe pain suddenly stops hurting. This may indicate a ruptured eardrum.  You or your children develop new problems such as severe headaches, stiff neck, difficulty swallowing, or swelling of the face or around the ear. Document Released: 07/15/2004  Document Revised: 02/20/2012 Document Reviewed: 11/19/2008 [facility] Patient Information 2015 [place]. This information is not intended to replace advice given to you by your health care provider. Make sure you discuss any questions you have with your health care provider.

## 2015-06-21 NOTE — ED Notes (Signed)
Pt states that a doctor put something in her left ear and now it is bothering her. She thinks she is allergic to it and wants us to take it out.

## 2015-06-29 ENCOUNTER — Other Ambulatory Visit: Payer: Self-pay | Admitting: *Deleted

## 2015-06-29 NOTE — Telephone Encounter (Signed)
Please advise patient to discuss ear pain with ENT, she should not be on Tramadol any longer. May use Ibuprofen prn pain. To see ENT or call their office soon.

## 2015-06-30 ENCOUNTER — Telehealth: Payer: Self-pay | Admitting: Family Medicine

## 2015-06-30 NOTE — Telephone Encounter (Signed)
Please have patient call the following places for Psych appointment. 1.  [facility]: Address: [address], [place], [place] [address] Phone: [phone].  2. [facility]: Address: [address], [place], [place] [address] Phone: [phone]

## 2015-06-30 NOTE — Telephone Encounter (Signed)
Pt called and needs referral to see a therapist for her depression. jw

## 2015-06-30 NOTE — Telephone Encounter (Signed)
Will forward to PCP for review. Chantz Montefusco, CMA. 

## 2015-07-02 NOTE — Telephone Encounter (Signed)
LMTCB, will try again later. If pt calls back please advise her of numbers listed below to see therapist for her depression. She can call to schedule her appt. Oline Belk, CMA.

## 2015-07-06 ENCOUNTER — Telehealth: Payer: Self-pay | Admitting: Family Medicine

## 2015-07-06 MED ORDER — NAPROXEN 500 MG PO TABS: 500.0000 mg | ORAL_TABLET | Freq: Two times a day (BID) | ORAL | Status: DC | PRN

## 2015-07-06 NOTE — Telephone Encounter (Signed)
The last time I saw her we discussed in length that she is not expected to be chronically on Tramadol and if needing to be she was advised to contact the ENT who did her surgery and started her on Tramadol and she agreed with plan.  Did she contact ENT office yet if she is still in pain?  I don't feel she should need Tramadol now since she is already past 4 wks since she had surgery done. If her pain is not going away, she needed to be seen soon by her ENT to reassess her to ensure she is not having another infection.  Tramadol refill declined. Advise follow up with ENT soon for reassessment of her Ear pain.

## 2015-07-06 NOTE — Telephone Encounter (Addendum)
From my recollection after ENT surgery she was started on this medication for pain and if that is not the case I discussed with her previously it will be prescribed for few days till she sees her ENT. Please have her try Ibuprofen, I do not want her to be on Tramadol for a long time. She still need to see her ENT for persistent ear pain, if this is not going away after weeks of surgery it could be that she has infection.  NB: Let her know I just reviewed her ENT note from [facility], she was placed on Oxycodone and Tramadol after the surgery.        I will send Naprosyn to her pharmacy for pain.        Again contact ENT's office for ear pain assessment soon. This is very important.

## 2015-07-06 NOTE — Telephone Encounter (Signed)
Will forward to PCP for review. Lisa Newton, CMA. 

## 2015-07-06 NOTE — Telephone Encounter (Signed)
Pt advised as directed below per Harriet on 07/06/15 in a new msg and I forwarded it to PCP. Skiler Tye, CMA.

## 2015-07-06 NOTE — Telephone Encounter (Signed)
Pt was read note from Dr Lum Babe She has contacted ENT and was told to call here since Lisa Newton first prescribed it She is  working ain a [employer] and still has some pain  Please advise

## 2015-07-06 NOTE — Telephone Encounter (Signed)
Needs refill on tramadol  °

## 2015-07-06 NOTE — Telephone Encounter (Signed)
Pt called and would like a refill on her Tramadol. She just started a new job and it will be a couple of weeks before she can come in to be seen since she has to work until 4 pm right now. Please call patient and let her know if she can have the refill or if she can see another doctor that would available in the afternoon after 4 pm. jw

## 2015-07-06 NOTE — Telephone Encounter (Signed)
Refill request from pt. Will forward to PCP for review. Gavriella Hearst, CMA. 

## 2015-07-07 ENCOUNTER — Ambulatory Visit: Payer: Medicaid Other | Admitting: Family Medicine

## 2015-07-07 NOTE — Telephone Encounter (Signed)
[facility] team,  Please  contact patient about the following information, she missed her clinic appointment. I have sent pain meds to her pharmacy. Have her see ENT soon. I will no longer prescribe Tramadol at this time since it has been 6 wks since her surgery.  From my recollection after ENT surgery she was started on this medication for pain and if that is not the case I discussed with her previously it will be prescribed for few days till she sees her ENT. Please have her try Ibuprofen, I do not want her to be on Tramadol for a long time.  She still need to see her ENT for persistent ear pain, if this is not going away after weeks of surgery it could be that she has infection.  NB: Let her know I just reviewed her ENT note from [facility], she was placed on Oxycodone and Tramadol after the surgery.  I will send Naprosyn to her pharmacy for pain.  Again contact ENT's office for ear pain assessment soon. This is very important.

## 2015-07-07 NOTE — Telephone Encounter (Signed)
Patient is scheduled to see MD today and will discuss at her appt. Lisa Newton,CMA

## 2015-07-07 NOTE — Telephone Encounter (Signed)
Spoke with patient and she voiced understanding. Jazmin Hartsell,CMA  

## 2015-09-01 ENCOUNTER — Encounter: Payer: Self-pay | Admitting: Family Medicine

## 2015-09-01 ENCOUNTER — Ambulatory Visit (INDEPENDENT_AMBULATORY_CARE_PROVIDER_SITE_OTHER): Payer: Medicaid Other | Admitting: Family Medicine

## 2015-09-01 VITALS — BP 149/93 | HR 67 | Temp 98.6°F | Ht 65.0 in | Wt 195.0 lb

## 2015-09-01 DIAGNOSIS — R1031 Right lower quadrant pain: Secondary | ICD-10-CM

## 2015-09-01 DIAGNOSIS — R202 Paresthesia of skin: Secondary | ICD-10-CM | POA: Diagnosis not present

## 2015-09-01 DIAGNOSIS — R12 Heartburn: Secondary | ICD-10-CM | POA: Diagnosis not present

## 2015-09-01 DIAGNOSIS — R079 Chest pain, unspecified: Secondary | ICD-10-CM | POA: Diagnosis present

## 2015-09-01 LAB — HEMOCCULT GUIAC POC 1CARD (OFFICE): FECAL OCCULT BLD: NEGATIVE

## 2015-09-01 NOTE — Assessment & Plan Note (Signed)
Clinical presentation does not fit the picture of appendicitis although she has RLQ pain. Pain is very mild with no vomiting, neg rigidity, rebound tenderness and neg Mcburney's sign. She does have stool impaction which could be contributing to her pain. FOBT negative today. I recommended Miralax for constipation which she stated she has at home. Tylenol as needed for pain. She is instructed to call soon if no improvement or go to the ED if this is worsening. Red flag for appendicitis discussed with her. She verbalized understanding.

## 2015-09-01 NOTE — Progress Notes (Signed)
Subjective:     Patient ID: Lisa Newton, female   DOB: 21-Mar-1971, 44 y.o.   MRN: [id number]  Abdominal Pain This is a new problem. Episode onset: Started 2 days ago. The onset quality is gradual. The problem occurs intermittently. The problem has been waxing and waning. The pain is located in the RLQ. The pain is at a severity of 8/10. The pain is moderate. The quality of the pain is dull. The abdominal pain radiates to the LLQ and back. Pertinent negatives include no anorexia, constipation, diarrhea, fever, nausea or vomiting. Associated symptoms comments: She sees slime in her stool and at times will see blood. She last saw blood in her stool 4 days ago, this was before she started having pain. She had seen blood in her stool twice.. Nothing aggravates the pain. The pain is relieved by nothing. She has tried nothing for the symptoms. There is no history of colon cancer, Crohn's disease, GERD, irritable bowel syndrome, pancreatitis, PUD or ulcerative colitis.   Tingling: She is here to follow up for her b/l feet and hand tingling sensation which started about 4 months ago. This has been Intermittent, but worsening over the last week. She was placed on Gabapentin by another provider which helped with her symptoms but since she was not sure why she was placed on this medication she self d/c it few weeks ago. No known trigger.   No current outpatient prescriptions on file prior to visit.   No current facility-administered medications on file prior to visit.   Past Medical History  Diagnosis Date  . Left ear hearing loss   . Depression 01/11/2012     Review of Systems  Constitutional: Negative for fever.  Respiratory: Negative.   Cardiovascular: Negative.   Gastrointestinal: Positive for abdominal pain. Negative for nausea, vomiting, diarrhea, constipation and anorexia.  Neurological: Negative for tremors and light-headedness.       Tingling in her hands and feet  All other systems reviewed  and are negative.  Filed Vitals:   09/01/15 0936  BP: 149/93  Pulse: 67  Temp: 98.6 F (37 C)  TempSrc: Oral  Height:  (1.651 m)  Weight: 195 lb (88.451 kg)       Objective:   Physical Exam  Constitutional: She appears well-developed. No distress.  Cardiovascular: Normal rate, regular rhythm, normal heart sounds and intact distal pulses.   No murmur heard. Pulmonary/Chest: Effort normal and breath sounds normal. No respiratory distress. She has no wheezes.  Abdominal: Soft. There is no hepatosplenomegaly. There is tenderness in the right lower quadrant. There is no rigidity, no rebound, no guarding, no CVA tenderness, no tenderness at McBurney's point and negative Murphy's sign.    Genitourinary: Rectal exam shows no external hemorrhoid, no internal hemorrhoid, no fissure, no mass, no tenderness and anal tone normal. Guaiac negative stool.  I could feel hard pellet-like stool in her rectum. I tried to disimpact her.  Musculoskeletal: Normal range of motion. She exhibits no edema.  Nursing note and vitals reviewed.      Assessment:     Abdominal pain Paresthesia.     Plan:     Check problem list.

## 2015-09-01 NOTE — Patient Instructions (Signed)
  Please get Miralax for constipation, if your pain persists in 2 days please call me, then I will get an imaging. If this is worsening please go to the hospital. Use Tylenol as needed for pain in the mean time. Please try multivitamin for tingling sensation since you don't want to get back on Gabapentin. Please see me back soon if no improvement.  Constipation Constipation is when a person:  Poops (has a bowel movement) less than 3 times a week.  Has a hard time pooping.  Has poop that is dry, hard, or bigger than normal. HOME CARE   Eat foods with a lot of fiber in them. This includes fruits, vegetables, beans, and whole grains such as brown rice.  Avoid fatty foods and foods with a lot of sugar. This includes french fries, hamburgers, cookies, candy, and soda.  If you are not getting enough fiber from food, take products with added fiber in them (supplements).  Drink enough fluid to keep your pee (urine) clear or pale yellow.  Exercise on a regular basis, or as told by your doctor.  Go to the restroom when you feel like you need to poop. Do not hold it.  Only take medicine as told by your doctor. Do not take medicines that help you poop (laxatives) without talking to your doctor first. GET HELP RIGHT AWAY IF:   You have bright red blood in your poop (stool).  Your constipation lasts more than 4 days or gets worse.  You have belly (abdominal) or butt (rectal) pain.  You have thin poop (as thin as a pencil).  You lose weight, and it cannot be explained. MAKE SURE YOU:   Understand these instructions.  Will watch your condition.  Will get help right away if you are not doing well or get worse. Document Released: 05/16/2008 Document Revised: 12/03/2013 Document Reviewed: 09/09/2013 [facility] Patient Information 2015 [place], [place]. This information is not intended to replace advice given to you by your health care provider. Make sure you discuss any questions you have with  your health care provider.

## 2015-09-01 NOTE — Assessment & Plan Note (Signed)
Both hand and feet. Improved previously on gabapentin. All work up done so far has been negative, no anemia,TSH normal, vit b12 normal. I recommended getting back on Gabapentin but she stated she does not want to be on any medication. I suggested she can also try MVI which she stated she prefer to try incase she has some vitamin deficiency causing this which was not tested. Return precaution discussed.

## 2015-10-02 ENCOUNTER — Ambulatory Visit: Payer: Medicaid Other | Admitting: Family Medicine

## 2015-10-06 ENCOUNTER — Ambulatory Visit (INDEPENDENT_AMBULATORY_CARE_PROVIDER_SITE_OTHER): Payer: Medicaid Other | Admitting: Family Medicine

## 2015-10-06 ENCOUNTER — Encounter: Payer: Self-pay | Admitting: Family Medicine

## 2015-10-06 VITALS — BP 118/78 | HR 71 | Temp 98.4°F | Wt 196.3 lb

## 2015-10-06 DIAGNOSIS — Z23 Encounter for immunization: Secondary | ICD-10-CM

## 2015-10-06 DIAGNOSIS — R079 Chest pain, unspecified: Secondary | ICD-10-CM | POA: Diagnosis present

## 2015-10-06 DIAGNOSIS — R12 Heartburn: Secondary | ICD-10-CM | POA: Diagnosis not present

## 2015-10-06 DIAGNOSIS — H9202 Otalgia, left ear: Secondary | ICD-10-CM

## 2015-10-06 MED ORDER — HYDROCODONE-ACETAMINOPHEN 10-325 MG PO TABS: 1.0000 | ORAL_TABLET | Freq: Three times a day (TID) | ORAL | Status: DC | PRN

## 2015-10-06 MED ORDER — IBUPROFEN 600 MG PO TABS: 600.0000 mg | ORAL_TABLET | Freq: Three times a day (TID) | ORAL | Status: DC | PRN

## 2015-10-06 NOTE — Patient Instructions (Addendum)
Referrals to audiology and ENT have been placed.  If you develop fevers, chills, nausea/ vomiting, tenderness/swelling or redness behind your ear, please seek immediate medical attention.  Please call us if you need anything else.  Otherwise, plan to follow up with Dr Lum BabeEniola as needed.

## 2015-10-06 NOTE — Progress Notes (Signed)
    Subjective: CC: Ear pain HPI: Patient is a 44 y.o. female presenting to clinic today for same day appt. Concerns today include:  1. Ear pain Patient reports that ear pain started when the weather got cold last week.  When it warmed up, ear pain improved.  Returned when it got cold again.  Pain is described as an aching pain that radiates to teeth and head on L side.  She notes that she had surgery on 05/17/15 on L ear.  She notes she had an infection before the surgery that required IV abx through a PICC line.  She notes that post surgical pain had resolved.  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  She notes her last visit was in July.  She was supposed to have a hearing test 10/20.  She notes that she called surgeon for an appt last week when she started having pain and she received a call stating she needed a referral for surgeon and audiologist.  Denies fevers, chills, vision changes, nausea or vomiting.  Endorses some dizziness.  Social History Reviewed: non smoker. FamHx and MedHx updated.  Please see EMR. Health Maintenance: Flu shot today  ROS: Per HPI  Objective: Office vital signs reviewed. BP 118/78 mmHg  Pulse 71  Temp(Src) 98.4 F (36.9 C) (Oral)  Wt 196 lb 4.8 oz (89.041 kg)  SpO2 99%  LMP 09/17/2015  Physical Examination:  General: Awake, alert, well nourished, NAD HEENT: Normal    Neck: No masses palpated. No LAD    Ears:   R ear: TM intact, normal light reflex, no erythema, no bulging  L ear: external ear with moderate inflammatory changes, mild cerumen in canal, inferior TM difficult to visualize, superior aspect without perforation or visible purulence/hemotypanum, TM appears dull.    Eyes: PERRLA, EOMI    Nose: nasal turbinates moist    Throat: MMM, no erythema Skin: dry, intact, no rashes, well healed scar without evidence of infection behind the L ear.  Assessment/ Plan: 44 y.o.  female with  1. Otalgia of left ear.  S/p surgical intervention in 05/2015.  No evidence of infection/ hemotympanum on exam, though inferior aspect of TM not well visualized.  No systemic symptoms.  Agree that her ENT provider should reevaluate.  - Ambulatory referral to ENT - Ambulatory referral to Audiology - ibuprofen (ADVIL,MOTRIN) 600 MG tablet; Take 1 tablet (600 mg total) by mouth every 8 (eight) hours as needed for moderate pain.  Dispense: 30 tablet; Refill: 0 - HYDROcodone-acetaminophen (NORCO) 10-325 MG tablet; Take 1 tablet by mouth every 8 (eight) hours as needed.  Dispense: 30 tablet; Refill: 0 - Return precautions reviewed - Follow up with PCP PRN  2. Encounter for immunization - Flu shot administered today.   Raliegh IpAshly M Gottschalk, DO PGY-2, [facility]

## 2015-10-14 ENCOUNTER — Encounter (HOSPITAL_COMMUNITY): Payer: Self-pay | Admitting: *Deleted

## 2015-10-14 ENCOUNTER — Emergency Department (HOSPITAL_COMMUNITY)
Admission: EM | Admit: 2015-10-14 | Discharge: 2015-10-14 | Disposition: A | Discharge status: Home / Self Care | Payer: Medicaid Other | Attending: Emergency Medicine | Admitting: Emergency Medicine

## 2015-10-14 DIAGNOSIS — Z7952 Long term (current) use of systemic steroids: Secondary | ICD-10-CM | POA: Diagnosis not present

## 2015-10-14 DIAGNOSIS — Z91040 Latex allergy status: Secondary | ICD-10-CM | POA: Diagnosis not present

## 2015-10-14 DIAGNOSIS — Z79899 Other long term (current) drug therapy: Secondary | ICD-10-CM | POA: Diagnosis not present

## 2015-10-14 DIAGNOSIS — H9202 Otalgia, left ear: Secondary | ICD-10-CM | POA: Diagnosis present

## 2015-10-14 DIAGNOSIS — R51 Headache: Secondary | ICD-10-CM | POA: Diagnosis not present

## 2015-10-14 DIAGNOSIS — Z9889 Other specified postprocedural states: Secondary | ICD-10-CM | POA: Insufficient documentation

## 2015-10-14 DIAGNOSIS — K0889 Other specified disorders of teeth and supporting structures: Secondary | ICD-10-CM | POA: Diagnosis not present

## 2015-10-14 DIAGNOSIS — H6092 Unspecified otitis externa, left ear: Secondary | ICD-10-CM | POA: Diagnosis not present

## 2015-10-14 DIAGNOSIS — F329 Major depressive disorder, single episode, unspecified: Secondary | ICD-10-CM | POA: Diagnosis not present

## 2015-10-14 MED ORDER — TRAMADOL HCL 50 MG PO TABS: 50.0000 mg | ORAL_TABLET | Freq: Four times a day (QID) | ORAL | Status: DC | PRN

## 2015-10-14 MED ORDER — CIPROFLOXACIN-DEXAMETHASONE 0.3-0.1 % OT SUSP: 4.0000 [drp] | Freq: Two times a day (BID) | OTIC | Status: DC

## 2015-10-14 MED ORDER — CIPROFLOXACIN-DEXAMETHASONE 0.3-0.1 % OT SUSP
4.0000 [drp] | Freq: Two times a day (BID) | OTIC | Status: AC
  Administered 2015-10-14: 4 [drp] via OTIC
  Filled 2015-10-14: qty 7.5

## 2015-10-14 NOTE — ED Notes (Signed)
Pt states that she has left ear pain. Denies drainage. States that she had surgery on it in June of this year. Pt is scheduled for an appt on the 8th but could not wait.

## 2015-10-14 NOTE — Discharge Instructions (Signed)
Ear Drops, Adult °You have been diagnosed with a condition requiring you to put drops of medicine into your outer ear. °HOME CARE INSTRUCTIONS  °· Put drops in the affected ear as instructed. After putting the drops in, you will need to lie down with the affected ear facing up for ten minutes so the drops will remain in the ear canal and run down and fill the canal. Continue using the ear drops for as long as directed by your health care provider. °· Prior to getting up, put a cotton ball gently in your ear canal. Leave enough of the cotton ball out so it can be easily removed. Do not attempt to push this down into the canal with a cotton-tipped swab or other instrument. °· Do not irrigate or wash out your ears if you have had a perforated eardrum or mastoid surgery, or unless instructed to do so by your health care provider. °· Keep appointments with your health care provider as instructed. °· Finish all medicine, or use for the length of time prescribed by your health care provider. Continue the drops even if your problem seems to be doing well after a couple days, or continue as instructed. °SEEK MEDICAL CARE IF: °· You become worse or develop increasing pain. °· You notice any unusual drainage from your ear (particularly if the drainage has a bad smell). °· You develop hearing difficulties. °· You experience a serious form of dizziness in which you feel as if the room is spinning, and you feel nauseated (vertigo). °· The outside of your ear becomes red or swollen or both. This may be a sign of an allergic reaction. °MAKE SURE YOU:  °· Understand these instructions. °· Will watch your condition. °· Will get help right away if you are not doing well or get worse. °  °This information is not intended to replace advice given to you by your health care provider. Make sure you discuss any questions you have with your health care provider. °  °Document Released: 11/22/2001 Document Revised: 12/19/2014 Document  Reviewed: 06/25/2013 °Elsevier Interactive Patient Education ©2016 Elsevier Inc. ° °Otitis Externa °Otitis externa is a germ infection in the outer ear. The outer ear is the area from the eardrum to the outside of the ear. Otitis externa is sometimes called "swimmer's ear." °HOME CARE °· Put drops in the ear as told by your doctor. °· Only take medicine as told by your doctor. °· If you have diabetes, your doctor may give you more directions. Follow your doctor's directions. °· Keep all doctor visits as told. °To avoid another infection: °· Keep your ear dry. Use the corner of a towel to dry your ear after swimming or bathing. °· Avoid scratching or putting things inside your ear. °· Avoid swimming in lakes, dirty water, or pools that use a chemical called chlorine poorly. °· You may use ear drops after swimming. Combine equal amounts of white vinegar and alcohol in a bottle. Put 3 or 4 drops in each ear. °GET HELP IF:  °· You have a fever. °· Your ear is still red, puffy (swollen), or painful after 3 days. °· You still have yellowish-white fluid (pus) coming from the ear after 3 days. °· Your redness, puffiness, or pain gets worse. °· You have a really bad headache. °· You have redness, puffiness, pain, or tenderness behind your ear. °MAKE SURE YOU:  °· Understand these instructions. °· Will watch your condition. °· Will get help right away if you   are not doing well or get worse.   This information is not intended to replace advice given to you by your health care provider. Make sure you discuss any questions you have with your health care provider.   Document Released: 05/16/2008 Document Revised: 12/19/2014 Document Reviewed: 12/15/2011 Elsevier Interactive Patient Education 2016 Elsevier Inc.  Ear Surgery, Care After These instructions give you information about caring for yourself after your procedure. Your doctor may also give you more specific instructions. Call your doctor if you have any problems  or questions after your procedure. HOME CARE  Do not move your head quickly or suddenly.  Keep your ear dry. Do not let water get in your ear or on the bandage (dressing) that covers your ear. Follow your doctor's instructions about how to bathe.  There are many different ways to close and cover a cut (incision), including stitches (sutures), skin glue, and adhesive strips. Follow your health care provider's instructions about:  Incision care.  Dressing changes and removal.  Incision closure removal.  Take medicines only as told by your doctor.  Try not to cough or sneeze. If you do cough or sneeze, open your mouth. This keeps the pressure the same on both sides of your eardrum.  Avoid blowing your nose as told by your doctor. If you do blow your nose, do it gently. Blow your nose on one side or nostril at a time.  Return to your normal activities as told by your doctor.  Avoid activity that takes a lot of effort, including heavy lifting, as told by your doctor. GET HELP IF:  You have redness, puffiness (swelling), or pain at the site of your incision.  You have fluid, blood, or yellowish-white fluid (pus) coming from your incision.  You have a fever.  You feel sick to your stomach (nauseous). GET HELP RIGHT AWAY IF:  You feel more pressure in your ear.  Your ear pain is much worse.  You throw up (vomit).   This information is not intended to replace advice given to you by your health care provider. Make sure you discuss any questions you have with your health care provider.   Document Released: 11/10/2008 Document Revised: 12/19/2014 Document Reviewed: 07/15/2014 Elsevier Interactive Patient Education [employer] Elsevier Inc.

## 2015-10-14 NOTE — ED Provider Notes (Signed)
CSN: [id number]     Arrival date & time 10/14/15  1509 History  By signing my name below, I, Gwenyth Ober, attest that this documentation has been prepared under the direction and in the presence of Danelle Berry, PA-C.  Electronically Signed: Gwenyth Ober, ED Scribe. 10/14/2015. 3:56 PM.   Chief Complaint  Patient presents with  . Otalgia   The history is provided by the patient. No language interpreter was used.   HPI Comments: Jerilynn Feldmeier is a 44 y.o. female with a history of left ear surgery who presents to the Emergency Department complaining of constant, moderate left ear pain that started 3 weeks ago. She states swelling of her left ear, left-sided dental pain and HA as associated symptoms. Pt had surgery on her left ear 5 months ago which required IV antibiotics. She had pain over the incision site following surgery, which resolved and then became worse again. Pt was seen by her PCP last week for the same who said her left ear was swollen and prescribed Ibuprofen. She denies ear drainage, rhinorrhea, cough, fever and chills.  Past Medical History  Diagnosis Date  . Left ear hearing loss   . Depression 01/11/2012   Past Surgical History  Procedure Laterality Date  . Left ear surgery      ruptured TM   Family History  Problem Relation Age of Onset  . Cancer Mother     lung cancer  . Diabetes Father   . Hypertension Father    Social History  Substance Use Topics  . Smoking status: Never Smoker   . Smokeless tobacco: None  . Alcohol Use: No   OB History    No data available     Review of Systems  Constitutional: Negative for fever and chills.  HENT: Positive for dental problem and ear pain. Negative for ear discharge and rhinorrhea.   Respiratory: Negative for cough.   Neurological: Positive for headaches.  All other systems reviewed and are negative.  Allergies  Tape; Augmentin; Bactrim; Latex; and Pollen extract  Home Medications   Prior to Admission  medications   Medication Sig Start Date End Date Taking? Authorizing Provider  Biotin 1000 MCG tablet Take 1,000 mcg by mouth daily.    Historical Provider, MD  gabapentin (NEURONTIN) 100 MG capsule Take 100 mg by mouth 3 (three) times daily.    Historical Provider, MD  HYDROcodone-acetaminophen (NORCO/VICODIN) 5-325 MG tablet Take 1-2 tablets by mouth every 4 (four) hours as needed. 11/01/15   Mady Gemma, PA-C  ibuprofen (ADVIL,MOTRIN) 600 MG tablet Take 1 tablet (600 mg total) by mouth every 8 (eight) hours as needed for moderate pain. Patient not taking: Reported on 10/30/2015 10/06/15   Raliegh Ip, DO  oxyCODONE-acetaminophen (PERCOCET/ROXICET) 5-325 MG tablet Take 1 tablet by mouth every 8 (eight) hours as needed for severe pain. 10/30/15   Doreene Eland, MD  predniSONE (DELTASONE) 20 MG tablet Take 2 tablets (40 mg total) by mouth daily. 11/01/15   Mady Gemma, PA-C  sertraline (ZOLOFT) 50 MG tablet Take 1 tablet (50 mg total) by mouth daily. Patient not taking: Reported on 11/01/2015 10/30/15   Doreene Eland, MD   BP 153/98 mmHg  Pulse 64  Temp(Src) 98.4 F (36.9 C) (Oral)  Resp 18  Ht  (1.626 m)  Wt 88.905 kg  BMI 33.63 kg/m2  SpO2 100%  LMP 09/17/2015 Physical Exam  Constitutional: She is oriented to person, place, and time. She appears well-developed and  well-nourished. No distress.  HENT:  Head: Normocephalic and atraumatic.  Right Ear: External ear normal.  Left Ear: External ear normal.  Nose: Nose normal.  Mouth/Throat: Oropharynx is clear and moist. No oropharyngeal exudate.  Unable to visualize left TM due to swelling of left EAC, with ttp and erythema, no active drainage Surgical scar over mastoid process, clean, dry, intact without erythema, induration, fluctuance, no ttp  Eyes: Conjunctivae and EOM are normal. Pupils are equal, round, and reactive to light. Right eye exhibits no discharge. Left eye exhibits no discharge. No  scleral icterus.  Neck: Normal range of motion. Neck supple. No JVD present. No tracheal deviation present.  Cardiovascular: Normal rate and regular rhythm.   Pulmonary/Chest: Effort normal and breath sounds normal. No stridor. No respiratory distress.  Musculoskeletal: Normal range of motion. She exhibits no edema.  Lymphadenopathy:    She has no cervical adenopathy.  Neurological: She is alert and oriented to person, place, and time. She exhibits normal muscle tone. Coordination normal.  Skin: Skin is warm and dry. No rash noted. She is not diaphoretic. No erythema. No pallor.  Psychiatric: She has a normal mood and affect. Her behavior is normal. Judgment and thought content normal.  Nursing note and vitals reviewed.  ED Course  Procedures  DIAGNOSTIC STUDIES: Oxygen Saturation is 99% on RA, normal by my interpretation.    COORDINATION OF CARE: 3:55 PM Discussed treatment plan with pt which includes ear drops, OTC pain medication and follow-up with ENT. Pt agreed to plan.  Labs Review Labs Reviewed - No data to display  Imaging Review No results found.     EKG Interpretation None      MDM   Final diagnoses:  Otitis externa, left    Pt with left otitis externa, with chronic ear pain and multiple infections.  No systemic symptoms, pt only complains of pain.  Pt was given Ciprodex to treat inflamed external auditory canal, TM not visualized due to swelling.  Pain meds given.  She has ENT follow up next week.  Filed Vitals:   10/14/15 1513 10/14/15 1655  BP: 137/101 153/98  Pulse: 68 64  Temp: 98.5 F (36.9 C) 98.4 F (36.9 C)  TempSrc: Oral Oral  Resp: 16 18  Height: 5\' 4"  (1.626 m)   Weight: 88.905 kg   SpO2: 99% 100%     I personally performed the services described in this documentation, which was scribed in my presence. The recorded information has been reviewed and is accurate.      Danelle BerryLeisa Rodnisha Blomgren, PA-C 11/08/15 2303  Lavera Guiseana Duo Liu, MD 11/09/15  [phone]

## 2015-10-18 ENCOUNTER — Encounter (HOSPITAL_COMMUNITY): Payer: Self-pay | Admitting: Emergency Medicine

## 2015-10-18 ENCOUNTER — Emergency Department (HOSPITAL_COMMUNITY)
Admission: EM | Admit: 2015-10-18 | Discharge: 2015-10-18 | Disposition: A | Discharge status: Home / Self Care | Payer: Medicaid Other | Attending: Emergency Medicine | Admitting: Emergency Medicine

## 2015-10-18 DIAGNOSIS — H9192 Unspecified hearing loss, left ear: Secondary | ICD-10-CM | POA: Diagnosis not present

## 2015-10-18 DIAGNOSIS — Z9889 Other specified postprocedural states: Secondary | ICD-10-CM | POA: Insufficient documentation

## 2015-10-18 DIAGNOSIS — Z792 Long term (current) use of antibiotics: Secondary | ICD-10-CM | POA: Insufficient documentation

## 2015-10-18 DIAGNOSIS — R6883 Chills (without fever): Secondary | ICD-10-CM | POA: Insufficient documentation

## 2015-10-18 DIAGNOSIS — Z91040 Latex allergy status: Secondary | ICD-10-CM | POA: Insufficient documentation

## 2015-10-18 DIAGNOSIS — F329 Major depressive disorder, single episode, unspecified: Secondary | ICD-10-CM | POA: Insufficient documentation

## 2015-10-18 DIAGNOSIS — H9202 Otalgia, left ear: Secondary | ICD-10-CM | POA: Diagnosis present

## 2015-10-18 MED ORDER — OXYCODONE-ACETAMINOPHEN 5-325 MG PO TABS
2.0000 | ORAL_TABLET | Freq: Once | ORAL | Status: AC
Start: 2015-10-18 — End: 2015-10-18
  Administered 2015-10-18: 2 via ORAL
  Filled 2015-10-18: qty 2

## 2015-10-18 MED ORDER — OXYCODONE-ACETAMINOPHEN 5-325 MG PO TABS: 2.0000 | ORAL_TABLET | ORAL | Status: DC | PRN

## 2015-10-18 MED ORDER — SULFAMETHOXAZOLE-TRIMETHOPRIM 800-160 MG PO TABS: 2.0000 | ORAL_TABLET | Freq: Two times a day (BID) | ORAL | Status: AC

## 2015-10-18 MED ORDER — AMOXICILLIN-POT CLAVULANATE ER 1000-62.5 MG PO TB12: 1.0000 | ORAL_TABLET | Freq: Two times a day (BID) | ORAL | Status: DC

## 2015-10-18 NOTE — ED Provider Notes (Signed)
CSN: [id number]     Arrival date & time 10/18/15  1047 History   First MD Initiated Contact with Patient 10/18/15 1117     Chief Complaint  Patient presents with  . Otalgia    l/ear pain    HPI   Lisa Newton is a 44 y.o. female with a PMH of left ear surgery due to petrositis (06/16) who presents to the ED with left ear pain x 3 weeks. She reports after her surgery in June, she initially had symptom relief, however she states her pain recurred 3 weeks ago and has been progressively worsening since that time. She denies exacerbating factors. She has tried home pain medication for symptom relief, which has been minimally effective. She denies fever, though states she has experienced chills. She reports associated headache. She denies lightheadedness, dizziness, vision changes. She states she has an appointment with ENT at [facility] on Tuesday, but that she could not wait to be seen until then due to pain. She was evaluated in the ED 11/02 and discharged with antibiotic ear drops.   Past Medical History  Diagnosis Date  . Left ear hearing loss   . Depression 01/11/2012   Past Surgical History  Procedure Laterality Date  . Left ear surgery      ruptured TM   Family History  Problem Relation Age of Onset  . Cancer Mother     lung cancer  . Diabetes Father   . Hypertension Father    Social History  Substance Use Topics  . Smoking status: Never Smoker   . Smokeless tobacco: None  . Alcohol Use: No   OB History    No data available      Review of Systems  Constitutional: Positive for chills. Negative for fever.  HENT: Positive for ear pain. Negative for congestion and ear discharge.   Respiratory: Negative for cough and shortness of breath.   Cardiovascular: Negative for chest pain.  Gastrointestinal: Negative for nausea, vomiting, abdominal pain, diarrhea and constipation.  Neurological: Positive for headaches. Negative for dizziness, weakness, light-headedness and numbness.       Allergies  Latex and Pollen extract  Home Medications   Prior to Admission medications   Medication Sig Start Date End Date Taking? Authorizing Provider  amoxicillin-clavulanate (AUGMENTIN XR) 1000-62.5 MG 12 hr tablet Take 1 tablet by mouth 2 (two) times daily. Take 1000 mg by mouth two times daily 10/18/15   Mady Gemma, PA-C  ciprofloxacin-dexamethasone [facility]) otic suspension Place 4 drops into the left ear 2 (two) times daily. 10/14/15   Danelle Berry, PA-C  HYDROcodone-acetaminophen (NORCO) 10-325 MG tablet Take 1 tablet by mouth every 8 (eight) hours as needed. 10/06/15   Ashly Hulen Skains, DO  ibuprofen (ADVIL,MOTRIN) 600 MG tablet Take 1 tablet (600 mg total) by mouth every 8 (eight) hours as needed for moderate pain. 10/06/15   Ashly Hulen Skains, DO  oxyCODONE-acetaminophen (PERCOCET/ROXICET) 5-325 MG tablet Take 2 tablets by mouth every 4 (four) hours as needed for severe pain. 10/18/15   Mady Gemma, PA-C  sulfamethoxazole-trimethoprim (BACTRIM DS,SEPTRA DS) 800-160 MG tablet Take 2 tablets by mouth 2 (two) times daily. 10/18/15 10/25/15  Mady Gemma, PA-C  traMADol (ULTRAM) 50 MG tablet Take 1 tablet (50 mg total) by mouth every 6 (six) hours as needed. 10/14/15   Danelle Berry, PA-C    BP 135/84 mmHg  Pulse 68  Temp(Src) 98.9 F (37.2 C) (Oral)  Resp 20  SpO2 100%  LMP 10/13/2015 Physical  Exam  Constitutional: She is oriented to person, place, and time. She appears well-developed and well-nourished. No distress.  HENT:  Head: Normocephalic and atraumatic.  Right Ear: Hearing, tympanic membrane, external ear and ear canal normal. No drainage. No mastoid tenderness.  Left Ear: External ear and ear canal normal. No drainage. No mastoid tenderness.  Nose: Nose normal. Right sinus exhibits no maxillary sinus tenderness and no frontal sinus tenderness. Left sinus exhibits no maxillary sinus tenderness and no frontal sinus tenderness.   Mouth/Throat: Uvula is midline and mucous membranes are normal. No oropharyngeal exudate, posterior oropharyngeal edema or posterior oropharyngeal erythema.  Left TM appears scarred. No obvious erythema, edema, perforation, retraction, bulging.  Eyes: Conjunctivae, EOM and lids are normal. Pupils are equal, round, and reactive to light. Right eye exhibits no discharge. Left eye exhibits no discharge. No scleral icterus.  Neck: Normal range of motion. Neck supple.  Cardiovascular: Normal rate, regular rhythm, normal heart sounds, intact distal pulses and normal pulses.   Pulmonary/Chest: Effort normal and breath sounds normal. No respiratory distress. She has no wheezes. She has no rales.  Abdominal: Soft. Normal appearance and bowel sounds are normal. She exhibits no distension and no mass. There is no tenderness. There is no rigidity, no rebound and no guarding.  Musculoskeletal: Normal range of motion. She exhibits no edema or tenderness.  Neurological: She is alert and oriented to person, place, and time. She has normal strength. No cranial nerve deficit or sensory deficit.  Skin: Skin is warm, dry and intact. No rash noted. She is not diaphoretic. No erythema. No pallor.  Psychiatric: She has a normal mood and affect. Her speech is normal and behavior is normal.  Nursing note and vitals reviewed.   ED Course  Procedures (including critical care time)  Labs Review Labs Reviewed - No data to display  Imaging Review No results found.     EKG Interpretation None      MDM   Final diagnoses:  Left ear pain    44 year old female presents with left ear pain x 3 weeks. Denies fever, though states she has experienced chills. Reports associated headache. Denies lightheadedness, dizziness, vision changes.  Patient is a afebrile. Vital signs stable. Left TM appears scarred. No obvious erythema, edema, perforation, retraction, bulging. No significant mastoid tenderness. No nasal  congestion. No tenderness to palpation of frontal or maxillary sinuses. Posterior oropharynx without edema, erythema, or exudate. Normal neuro exam with no focal deficit.  Patient evaluated in the ED on 11/02, and discharged with ciprodex ear drops. Patient reports worsening pain, and states her symptoms feel similar to her pain before surgery. Spoke with Dr. Estell HarpinZammit, who recommended re-starting antibiotics patient was on prior to surgery (bactrim and augmentin). Patient to follow up with otolaryngology as scheduled on Tuesday. Return precautions discussed at length. Patient verbalizes her understanding and is in agreement with plan.  BP 135/84 mmHg  Pulse 68  Temp(Src) 98.9 F (37.2 C) (Oral)  Resp 20  SpO2 100%  LMP 10/13/2015     Mady GemmaElizabeth C Ardit Danh, PA-C 10/18/15 1231  Bethann BerkshireJoseph Zammit, MD 10/19/15 [phone]

## 2015-10-18 NOTE — ED Notes (Signed)
Pt reports persistant l/ear pain. Pain unresponsive to prescribed pain medication

## 2015-10-18 NOTE — Discharge Instructions (Signed)
1. Medications: percocet, oral antibiotics, usual home medications 2. Treatment: rest, drink plenty of fluids, use warm compresses  3. Follow Up: please followup with ENT as scheduled on Tuesday for discussion of your diagnoses and further evaluation after today's visit; if you do not have a primary care doctor use the resource guide provided to find one; please return to the ER for high fever, severe pain, ear discharge, persistent vomiting, new or worsening symptoms   Ear Surgery, Care After These instructions give you information about caring for yourself after your procedure. Your doctor may also give you more specific instructions. Call your doctor if you have any problems or questions after your procedure. HOME CARE  Do not move your head quickly or suddenly.  Keep your ear dry. Do not let water get in your ear or on the bandage (dressing) that covers your ear. Follow your doctor's instructions about how to bathe.  There are many different ways to close and cover a cut (incision), including stitches (sutures), skin glue, and adhesive strips. Follow your health care provider's instructions about:  Incision care.  Dressing changes and removal.  Incision closure removal.  Take medicines only as told by your doctor.  Try not to cough or sneeze. If you do cough or sneeze, open your mouth. This keeps the pressure the same on both sides of your eardrum.  Avoid blowing your nose as told by your doctor. If you do blow your nose, do it gently. Blow your nose on one side or nostril at a time.  Return to your normal activities as told by your doctor.  Avoid activity that takes a lot of effort, including heavy lifting, as told by your doctor. GET HELP IF:  You have redness, puffiness (swelling), or pain at the site of your incision.  You have fluid, blood, or yellowish-white fluid (pus) coming from your incision.  You have a fever.  You feel sick to your stomach (nauseous). GET HELP  RIGHT AWAY IF:  You feel more pressure in your ear.  Your ear pain is much worse.  You throw up (vomit).   This information is not intended to replace advice given to you by your health care provider. Make sure you discuss any questions you have with your health care provider.   Document Released: 11/10/2008 Document Revised: 12/19/2014 Document Reviewed: 07/15/2014 Elsevier Interactive Patient Education [employer] Inc[employer]

## 2015-10-19 NOTE — ED Provider Notes (Signed)
Medical screening examination/treatment/procedure(s) were performed by non-physician practitioner and as supervising physician I was immediately available for consultation/collaboration.    Lavera Guiseana Duo Rehema Muffley, MD 10/19/15 [phone]

## 2015-10-23 ENCOUNTER — Telehealth: Payer: Self-pay | Admitting: *Deleted

## 2015-10-23 NOTE — Telephone Encounter (Signed)
Patient will be bringing the original prescriptions back.  Once the new prescriptions are written please give patient's daughter Sabra Heckiffany Tool a call to pick up prescriptions per patient request.  Make sure daughter has ID.  Tiffany's number is [phone].  Clovis PuMartin, Curly Mackowski L, RN

## 2015-10-23 NOTE — Telephone Encounter (Signed)
Patient in nurse clinic stating she can't get any of medication filled that was written by a provider [facility].  She was given Rx for Percocet #30 and Gabapentin 100 mg #90.  Patient stated that her PCP is the only one to write Rx for pain medication and nerve medications.  Patient stated she has been out of work for the past  5 days due to headaches.  Letter from medicaid placed in provider box for review.  Please give her a call.  Clovis PuMartin, Jeet Shough L, RN

## 2015-10-23 NOTE — Telephone Encounter (Signed)
I called to speak with patient about her prescription. Message left for her to call back.  Info to be given when she calls back. 1. Her opiates and nerve medication refill has been locked in by medicaid likely because she got multiple refills from multiple providers in the last few weeks. 2. The letter she provided stated the only physician that can refill her medication is Dr Ellan LambertBrent Cengia and not Dr Lum BabeEniola, so I am not able to do this refill. 3. The only pharmacy approved for her per this letter is [facility] in [place]. 4. I will leave her letter up front for pick up when ever she is available to do so.  Unfortunately she will need to [facility] since he was part of the team that initiated her pain meds.

## 2015-10-23 NOTE — Telephone Encounter (Signed)
NB: Letter given to FelicityJackie. She will call patient to come pick it up.

## 2015-10-23 NOTE — Telephone Encounter (Signed)
I will need the script given from [facility] before writing another. Please please prescription in my in box and not give it back to her.

## 2015-10-30 ENCOUNTER — Ambulatory Visit (INDEPENDENT_AMBULATORY_CARE_PROVIDER_SITE_OTHER): Payer: Medicaid Other | Admitting: Family Medicine

## 2015-10-30 ENCOUNTER — Encounter: Payer: Self-pay | Admitting: Family Medicine

## 2015-10-30 VITALS — BP 133/86 | HR 76 | Temp 98.3°F | Ht 66.0 in | Wt 195.0 lb

## 2015-10-30 DIAGNOSIS — H9209 Otalgia, unspecified ear: Secondary | ICD-10-CM | POA: Insufficient documentation

## 2015-10-30 DIAGNOSIS — R079 Chest pain, unspecified: Secondary | ICD-10-CM | POA: Diagnosis not present

## 2015-10-30 DIAGNOSIS — F418 Other specified anxiety disorders: Secondary | ICD-10-CM

## 2015-10-30 DIAGNOSIS — H9202 Otalgia, left ear: Secondary | ICD-10-CM | POA: Diagnosis not present

## 2015-10-30 DIAGNOSIS — R12 Heartburn: Secondary | ICD-10-CM | POA: Diagnosis not present

## 2015-10-30 HISTORY — DX: Other specified anxiety disorders: F41.8

## 2015-10-30 MED ORDER — OXYCODONE-ACETAMINOPHEN 5-325 MG PO TABS: 1.0000 | ORAL_TABLET | Freq: Three times a day (TID) | ORAL | Status: DC | PRN

## 2015-10-30 MED ORDER — SERTRALINE HCL 50 MG PO TABS: 50.0000 mg | ORAL_TABLET | Freq: Every day | ORAL | Status: DC

## 2015-10-30 NOTE — Assessment & Plan Note (Signed)
S/P surgery fo(I&D) for petrous bone infection. Ear does not look infected to me. Uncertain the cause of her persistent pain. She has follow-up with ENT for possible CT scan to assess for occult infection. I gave a short course of percocet til she sees her ENT. Return precaution discussed.

## 2015-10-30 NOTE — Progress Notes (Signed)
Subjective:     Patient ID: Lisa Newton, female   DOB: 24-Jun-1971, 44 y.o.   MRN: [id number]  HPI Earache: Patient still having left ear pain s/p petrous bone surgery about 6 months ago. She was seen by ENT last week who prescribed gabapentin and opiates. She is about out of her pain medicine. She has follow-up appointment with ENT in 2 wks. She denies ear discharge. Depression/Anxiety: Patient stated her anxiety and depression is getting worse. She was diagnosed years ago but had refused to be on medication. Her anxiety level is higher because of her ear pain. She had to cut back her work hours because of pain and she lost her previous job because of her ear problem. She is worried the same thing might happen to her again. She also get stressed about her husband who is incarcerated. He will be released in Jan and she is looking forward to that.  Current Outpatient Prescriptions on File Prior to Visit  Medication Sig Dispense Refill  . ibuprofen (ADVIL,MOTRIN) 600 MG tablet Take 1 tablet (600 mg total) by mouth every 8 (eight) hours as needed for moderate pain. (Patient not taking: Reported on 10/30/2015) 30 tablet 0  . traMADol (ULTRAM) 50 MG tablet Take 1 tablet (50 mg total) by mouth every 6 (six) hours as needed. (Patient not taking: Reported on 10/30/2015) 15 tablet 0   No current facility-administered medications on file prior to visit.   Past Medical History  Diagnosis Date  . Left ear hearing loss   . Depression 01/11/2012     Review of Systems  Respiratory: Negative.   Cardiovascular: Negative.   Gastrointestinal: Negative.   Musculoskeletal: Negative.   Psychiatric/Behavioral: Negative for suicidal ideas, hallucinations, confusion, sleep disturbance and self-injury. The patient is nervous/anxious.   All other systems reviewed and are negative.      Filed Vitals:   10/30/15 0853  BP: 133/86  Pulse: 76  Temp: 98.3 F (36.8 C)  TempSrc: Oral  Height: 5\' 6"  (1.676 m)   Weight: 195 lb (88.451 kg)    Objective:   Physical Exam  Constitutional: She is oriented to person, place, and time. She appears well-developed. No distress.  HENT:  Right Ear: Tympanic membrane, external ear and ear canal normal.  Left Ear: External ear and ear canal normal.  Cardiovascular: Normal rate, regular rhythm, normal heart sounds and intact distal pulses.   No murmur heard. Pulmonary/Chest: Effort normal and breath sounds normal. No respiratory distress. She has no wheezes.  Abdominal: Soft. Bowel sounds are normal. She exhibits no distension and no mass. There is no tenderness.  Neurological: She is alert and oriented to person, place, and time.  Psychiatric: She has a normal mood and affect. Her behavior is normal. Judgment and thought content normal.  Nursing note and vitals reviewed.      Assessment:     Otalgia Depression with anxiety     Plan:     Check problem list.

## 2015-10-30 NOTE — Assessment & Plan Note (Signed)
PHQ9 score of 11 and GAD7 score of 20. Trial of Zoloft for depression and anxiety. [facility]. Return precaution discussed. She is not suicidal or homicidal.

## 2015-10-30 NOTE — Progress Notes (Signed)
Dr. Lum BabeEniola requested a Behavioral Health Consult.   Presenting Issue: Ms. Lisa Newton presents with symptoms of depression and anxiety resulting from chronic pain and financial stressors.  Report of symptoms: Patient reported worry, depressed mood, lack of motivation and pleasure, low energy, and fatigue.  Duration of CURRENT symptoms: Patient reported that the current symptoms began around January, 2016, when she first began to experience severe ear pain.  Age of onset of first mood disturbance: 44 years old (per patient report)  Impact on function: Previously, patient was forced to stop working while she recovered from surgery. However, she has since regained employment and denies any impairment directly resulting from depression or anxiety.  Psychiatric History - Diagnoses: Depression (per chart) - Pharmacotherapy: None - Outpatient therapy: None  Family history of psychiatric issues: Not assessed  Current and history of substance use: Patient denied current substance use, including alcohol.  Medical conditions that might explain or contribute to symptoms:   PHQ-9: 11 GAD-7: 20  Assessment / Plan / Recommendations:Patient reported that she first developed chronic pain due to ear infections in January of this year. As a result, she underwent surgery in February, which resulted in having to quit her job and 6 months of unemployment and financial difficulties. She began working again several months ago. However, her ear pain has returned, and she is worried about the prospect of additional medical procedures and having to quit her job again. Since January, she has experienced symptoms of depression including depressed mood and anhedonia - she was unable to think of any enjoyable activities. However, she reported that she feels optimistic due to her faith, and she denied thoughts of self-harm. Patient also reported that she is looking forward to the January release of her husband, who is  currently incarcerated for driving under the influence of alcohol (per patient's report), and that she is also looking forward to seeing her children during the upcoming holidays. [facility] encouraged patient to seek out enjoyable activities and increase behavioral activation. Although Dr. Lum BabeEniola is prescribing an antidepressant , patient indicated that she was unsure whether she wanted to take it because she "does not like medicine". [facility] used motivational interviewing strategies to encourage medication compliance. Patient declined to schedule a [facility] at this time, but accepted information for how to schedule a [facility] in the future. [facility] will call patient in two weeks to assess medication compliance.

## 2015-10-30 NOTE — Patient Instructions (Signed)
Sertraline tablets What is this medicine? SERTRALINE (SER tra leen) is used to treat depression. It may also be used to treat obsessive compulsive disorder, panic disorder, post-trauma stress, premenstrual dysphoric disorder (PMDD) or social anxiety. This medicine may be used for other purposes; ask your health care provider or pharmacist if you have questions. What should I tell my health care provider before I take this medicine? They need to know if you have any of these conditions: -bipolar disorder or a family history of bipolar disorder -diabetes -glaucoma -heart disease -high blood pressure -history of irregular heartbeat -history of low levels of calcium, magnesium, or potassium in the blood -if you often drink alcohol -liver disease -receiving electroconvulsive therapy -seizures -suicidal thoughts, plans, or attempt; a previous suicide attempt by you or a family member -thyroid disease -an unusual or allergic reaction to sertraline, other medicines, foods, dyes, or preservatives -pregnant or trying to get pregnant -breast-feeding How should I use this medicine? Take this medicine by mouth with a glass of water. Follow the directions on the prescription label. You can take it with or without food. Take your medicine at regular intervals. Do not take your medicine more often than directed. Do not stop taking this medicine suddenly except upon the advice of your doctor. Stopping this medicine too quickly may cause serious side effects or your condition may worsen. A special MedGuide will be given to you by the pharmacist with each prescription and refill. Be sure to read this information carefully each time. Talk to your pediatrician regarding the use of this medicine in children. While this drug may be prescribed for children as young as 7 years for selected conditions, precautions do apply. Overdosage: If you think you have taken too much of this medicine contact a poison control  center or emergency room at once. NOTE: This medicine is only for you. Do not share this medicine with others. What if I miss a dose? If you miss a dose, take it as soon as you can. If it is almost time for your next dose, take only that dose. Do not take double or extra doses. What may interact with this medicine? Do not take this medicine with any of the following medications: -certain medicines for fungal infections like fluconazole, itraconazole, ketoconazole, posaconazole, voriconazole -cisapride -disulfiram -dofetilide -linezolid -MAOIs like Carbex, Eldepryl, Marplan, Nardil, and Parnate -metronidazole -methylene blue (injected into a vein) -pimozide -thioridazine -ziprasidone This medicine may also interact with the following medications: -alcohol -aspirin and aspirin-like medicines -certain medicines for depression, anxiety, or psychotic disturbances -certain medicines for irregular heart beat like flecainide, propafenone -certain medicines for migraine headaches like almotriptan, eletriptan, frovatriptan, naratriptan, rizatriptan, sumatriptan, zolmitriptan -certain medicines for sleep -certain medicines for seizures like carbamazepine, valproic acid, phenytoin -certain medicines that treat or prevent blood clots like warfarin, enoxaparin, dalteparin -cimetidine -digoxin -diuretics -fentanyl -furazolidone -isoniazid -lithium -NSAIDs, medicines for pain and inflammation, like ibuprofen or naproxen -other medicines that prolong the QT interval (cause an abnormal heart rhythm) -procarbazine -rasagiline -supplements like St. John's wort, kava kava, valerian -tolbutamide -tramadol -tryptophan This list may not describe all possible interactions. Give your health care provider a list of all the medicines, herbs, non-prescription drugs, or dietary supplements you use. Also tell them if you smoke, drink alcohol, or use illegal drugs. Some items may interact with your  medicine. What should I watch for while using this medicine? Tell your doctor if your symptoms do not get better or if they get worse. Visit your doctor   or health care professional for regular checks on your progress. Because it may take several weeks to see the full effects of this medicine, it is important to continue your treatment as prescribed by your doctor. Patients and their families should watch out for new or worsening thoughts of suicide or depression. Also watch out for sudden changes in feelings such as feeling anxious, agitated, panicky, irritable, hostile, aggressive, impulsive, severely restless, overly excited and hyperactive, or not being able to sleep. If this happens, especially at the beginning of treatment or after a change in dose, call your health care professional. You may get drowsy or dizzy. Do not drive, use machinery, or do anything that needs mental alertness until you know how this medicine affects you. Do not stand or sit up quickly, especially if you are an older patient. This reduces the risk of dizzy or fainting spells. Alcohol may interfere with the effect of this medicine. Avoid alcoholic drinks. Your mouth may get dry. Chewing sugarless gum or sucking hard candy, and drinking plenty of water may help. Contact your doctor if the problem does not go away or is severe. What side effects may I notice from receiving this medicine? Side effects that you should report to your doctor or health care professional as soon as possible: -allergic reactions like skin rash, itching or hives, swelling of the face, lips, or tongue -black or bloody stools, blood in the urine or vomit -fast, irregular heartbeat -feeling faint or lightheaded, falls -hallucination, loss of contact with reality -seizures -suicidal thoughts or other mood changes -unusual bleeding or bruising -unusually weak or tired -vomiting Side effects that usually do not require medical attention (report to your  doctor or health care professional if they continue or are bothersome): -change in appetite -change in sex drive or performance -diarrhea -increased sweating -indigestion, nausea -tremors This list may not describe all possible side effects. Call your doctor for medical advice about side effects. You may report side effects to FDA at 1-800-FDA-1088. Where should I keep my medicine? Keep out of the reach of children. Store at room temperature between 15 and 30 degrees C (59 and 86 degrees F). Throw away any unused medicine after the expiration date. NOTE: This sheet is a summary. It may not cover all possible information. If you have questions about this medicine, talk to your doctor, pharmacist, or health care provider.    2016, Elsevier/Gold Standard. (2013-06-25 12:57:35)  

## 2015-11-01 ENCOUNTER — Encounter (HOSPITAL_COMMUNITY): Payer: Self-pay | Admitting: Emergency Medicine

## 2015-11-01 ENCOUNTER — Emergency Department (HOSPITAL_COMMUNITY)
Admission: EM | Admit: 2015-11-01 | Discharge: 2015-11-01 | Disposition: A | Discharge status: Home / Self Care | Payer: Medicaid Other | Attending: Emergency Medicine | Admitting: Emergency Medicine

## 2015-11-01 DIAGNOSIS — Z79899 Other long term (current) drug therapy: Secondary | ICD-10-CM | POA: Insufficient documentation

## 2015-11-01 DIAGNOSIS — F329 Major depressive disorder, single episode, unspecified: Secondary | ICD-10-CM | POA: Diagnosis not present

## 2015-11-01 DIAGNOSIS — H9192 Unspecified hearing loss, left ear: Secondary | ICD-10-CM | POA: Diagnosis not present

## 2015-11-01 DIAGNOSIS — Z9889 Other specified postprocedural states: Secondary | ICD-10-CM | POA: Insufficient documentation

## 2015-11-01 DIAGNOSIS — G8929 Other chronic pain: Secondary | ICD-10-CM | POA: Insufficient documentation

## 2015-11-01 DIAGNOSIS — Z91040 Latex allergy status: Secondary | ICD-10-CM | POA: Insufficient documentation

## 2015-11-01 DIAGNOSIS — H9202 Otalgia, left ear: Secondary | ICD-10-CM | POA: Diagnosis present

## 2015-11-01 MED ORDER — PREDNISONE 20 MG PO TABS: 40.0000 mg | ORAL_TABLET | Freq: Every day | ORAL | Status: DC

## 2015-11-01 MED ORDER — HYDROCODONE-ACETAMINOPHEN 5-325 MG PO TABS: 1.0000 | ORAL_TABLET | ORAL | Status: DC | PRN

## 2015-11-01 MED ORDER — HYDROCODONE-ACETAMINOPHEN 5-325 MG PO TABS
2.0000 | ORAL_TABLET | Freq: Once | ORAL | Status: AC
  Administered 2015-11-01: 2 via ORAL
  Filled 2015-11-01: qty 2

## 2015-11-01 NOTE — ED Provider Notes (Signed)
CSN: [id number]     Arrival date & time 11/01/15  1057 History   First MD Initiated Contact with Patient 11/01/15 1328     Chief Complaint  Patient presents with  . Otalgia    recurrent ear pain    HPI   Lisa Newton is a 44 y.o. 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 She was evaluated in the ED 11/06 for the same symptoms, and was discharged on antibiotics at that time. Her antibiotics were discontinued at her appointment with ENT at [facility] 11/08, and she was started on gabapentin for pain. She denies exacerbating factors. She has tried her home pain medication with minimal symptom relief. She denies fever, chills, chest pain, shortness of breath, abdominal pain, N/V, lightheadedness, dizziness.  Past Medical History  Diagnosis Date  . Left ear hearing loss   . Depression 01/11/2012   Past Surgical History  Procedure Laterality Date  . Left ear surgery      ruptured TM   Family History  Problem Relation Age of Onset  . Cancer Mother     lung cancer  . Diabetes Father   . Hypertension Father    Social History  Substance Use Topics  . Smoking status: Never Smoker   . Smokeless tobacco: None  . Alcohol Use: No   OB History    No data available      Review of Systems  Constitutional: Negative for fever and chills.  HENT: Positive for ear pain.   Respiratory: Negative for shortness of breath.   Cardiovascular: Negative for chest pain.  Gastrointestinal: Negative for nausea, vomiting and abdominal distention.  Neurological: Negative for dizziness, light-headedness and headaches.  All other systems reviewed and are negative.     Allergies  Tape; Augmentin; Bactrim; Latex; and Pollen extract  Home Medications   Prior to Admission medications   Medication Sig Start Date End Date Taking? Authorizing Provider  Biotin 1000 MCG tablet Take 1,000 mcg by mouth daily.   Yes Historical Provider,  MD  gabapentin (NEURONTIN) 100 MG capsule Take 100 mg by mouth 3 (three) times daily.   Yes Historical Provider, MD  oxyCODONE-acetaminophen (PERCOCET/ROXICET) 5-325 MG tablet Take 1 tablet by mouth every 8 (eight) hours as needed for severe pain. 10/30/15  Yes Doreene Eland, MD  HYDROcodone-acetaminophen (NORCO/VICODIN) 5-325 MG tablet Take 1-2 tablets by mouth every 4 (four) hours as needed. 11/01/15   Mady Gemma, PA-C  ibuprofen (ADVIL,MOTRIN) 600 MG tablet Take 1 tablet (600 mg total) by mouth every 8 (eight) hours as needed for moderate pain. Patient not taking: Reported on 10/30/2015 10/06/15   Raliegh Ip, DO  predniSONE (DELTASONE) 20 MG tablet Take 2 tablets (40 mg total) by mouth daily. 11/01/15   Mady Gemma, PA-C  sertraline (ZOLOFT) 50 MG tablet Take 1 tablet (50 mg total) by mouth daily. Patient not taking: Reported on 11/01/2015 10/30/15   Doreene Eland, MD    BP 131/80 mmHg  Pulse 84  Temp(Src) 98.6 F (37 C) (Oral)  Resp 16  SpO2 99%  LMP 10/13/2015 Physical Exam  Constitutional: She is oriented to person, place, and time. She appears well-developed and well-nourished. No distress.  HENT:  Head: Normocephalic and atraumatic.  Right Ear: Hearing, tympanic membrane, external ear and ear canal normal.  Left Ear: Hearing, external ear and ear canal normal. Tympanic membrane is scarred. Tympanic membrane is not erythematous,  not retracted and not bulging.  Nose: Nose normal.  Mouth/Throat: Uvula is midline, oropharynx is clear and moist and mucous membranes are normal.  Left TM appears scarred. No significant erythema or edema.  Eyes: Conjunctivae, EOM and lids are normal. Pupils are equal, round, and reactive to light. Right eye exhibits no discharge. Left eye exhibits no discharge. No scleral icterus.  Neck: Normal range of motion. Neck supple.  Cardiovascular: Normal rate, regular rhythm, normal heart sounds, intact distal pulses and normal  pulses.   Pulmonary/Chest: Effort normal and breath sounds normal. No respiratory distress.  Abdominal: Soft. Normal appearance and bowel sounds are normal. She exhibits no distension and no mass. There is no tenderness. There is no rigidity, no rebound and no guarding.  Musculoskeletal: Normal range of motion. She exhibits no edema or tenderness.  Neurological: She is alert and oriented to person, place, and time. She has normal strength. No cranial nerve deficit or sensory deficit.  Skin: Skin is warm, dry and intact. No rash noted. She is not diaphoretic. No erythema. No pallor.  Psychiatric: She has a normal mood and affect. Her speech is normal and behavior is normal. Judgment and thought content normal.  Nursing note and vitals reviewed.   ED Course  Procedures (including critical care time)  Labs Review Labs Reviewed - No data to display  Imaging Review No results found.   I have personally reviewed and evaluated these images and lab results as part of my medical decision-making.   EKG Interpretation None      MDM   Final diagnoses:  Left ear pain    44 year old female presents with chronic left ear pain. Denies fever, chills, chest pain, shortness of breath, abdominal pain, N/V, lightheadedness, dizziness. States she has been taking gabapentin and oxycontin once daily for symptom relief, which has not been effective.  Patient is afebrile. Vital signs stable. Left TM appears scarred, no erythema or edema. No evidence of infection. Heart regular rate and rhythm. Lungs clear to auscultation bilaterally. Abdomen soft, non-tender, non-distended. Patient moves all extremities and ambulates without difficulty.  Patient given pain medication in the ED. Patient discussed with and seen by Dr. Adriana Simasook. Advised discharging with steroid burst and better pain control. [facility]). Patient is nontoxic. Feel she is stable for discharge at  this time. Return precautions discussed. Patient verbalizes her understanding and is in agreement with plan.  BP 131/80 mmHg  Pulse 84  Temp(Src) 98.6 F (37 C) (Oral)  Resp 16  SpO2 99%  LMP 10/13/2015       Mady GemmaElizabeth C Christipher Rieger, PA-C 11/01/15 1608  Donnetta HutchingBrian Cook, MD 11/03/15 1222

## 2015-11-01 NOTE — ED Notes (Signed)
Pt reports recurrent pain in left ear. Pt seen by PCP and ENT in last two weeks. Pt treated with pain medicine. Due to see ENT on 11/29

## 2015-11-01 NOTE — ED Notes (Signed)
Bed: [id number] Expected date:  Expected time:  Means of arrival:  Comments: Hold triage 5

## 2015-11-01 NOTE — ED Notes (Signed)
She c/o ~1 month hx of left ear pain.  She cites undergoing mastoid operation in [place] in June of this year.  She states she was recently seen here and prescribed antibiotic, [place] and recommended Neurontin which she states "Isn't helping".

## 2015-11-01 NOTE — ED Notes (Signed)
I have just medicated her and gotten her a snack and a drink.

## 2015-11-01 NOTE — Discharge Instructions (Signed)
1. Medications: prednisone, vicodin, usual home medications 2. Treatment: rest, drink plenty of fluids 3. Follow Up: please followup with your primary doctor (ENT at [facility]) for discussion of your diagnoses and further evaluation after today's visit; please return to the ER for high fever, severe pain, new or worsening symptoms   Earache An earache, also called otalgia, can be caused by many things. Pain from an earache can be sharp, dull, or burning. The pain may be temporary or constant. Earaches can be caused by problems with the ear, such as infection in either the middle ear or the ear canal, injury, impacted ear wax, middle ear pressure, or a foreign body in the ear. Ear pain can also result from problems in other areas. This is called referred pain. For example, pain can come from a sore throat, a tooth infection, or problems with the jaw or the joint between the jaw and the skull (temporomandibular joint, or TMJ). The cause of an earache is not always easy to identify. Watchful waiting may be appropriate for some earaches until a clear cause of the pain can be found. HOME CARE INSTRUCTIONS Watch your condition for any changes. The following actions may help to lessen any discomfort that you are feeling:  Take medicines only as directed by your health care provider. This includes ear drops.  Apply ice to your outer ear to help reduce pain.  Put ice in a plastic bag.  Place a towel between your skin and the bag.  Leave the ice on for 20 minutes, 2-3 times per day.  Do not put anything in your ear other than medicine that is prescribed by your health care provider.  Try resting in an upright position instead of lying down. This may help to reduce pressure in the middle ear and relieve pain.  Chew gum if it helps to relieve your ear pain.  Control any allergies that you have.  Keep all follow-up visits as directed by your health care provider. This is important. SEEK MEDICAL CARE  IF:  Your pain does not improve within 2 days.  You have a fever.  You have new or worsening symptoms. SEEK IMMEDIATE MEDICAL CARE IF:  You have a severe headache.  You have a stiff neck.  You have difficulty swallowing.  You have redness or swelling behind your ear.  You have drainage from your ear.  You have hearing loss.  You feel dizzy.   This information is not intended to replace advice given to you by your health care provider. Make sure you discuss any questions you have with your health care provider.   Document Released: 07/15/2004 Document Revised: 12/19/2014 Document Reviewed: 06/29/2014 Elsevier Interactive Patient Education [employer]2016 Elsevier Inc.

## 2015-11-19 ENCOUNTER — Telehealth: Payer: Self-pay | Admitting: *Deleted

## 2015-11-19 NOTE — Telephone Encounter (Signed)
-----   Message from Doreene ElandKehinde T Eniola, MD sent at 11/18/2015 11:05 AM EST ----- Please contact patient. Advise her that I got her report from the ENT. They had recommended discontinuing opoid. Let her know that I will not be refilling anymore opoid for her. I am recommending that she continues Gabapentin for now.  Thanks.

## 2015-11-19 NOTE — Telephone Encounter (Signed)
Patient is aware of this.  She cancelled her appt for tomorrow due to having an MRI scheduled also.  States that she will call us back when she needs to reschedule. Jazmin Hartsell,CMA

## 2015-11-20 ENCOUNTER — Ambulatory Visit: Payer: Medicaid Other | Admitting: Family Medicine

## 2016-01-13 ENCOUNTER — Telehealth: Payer: Self-pay | Admitting: Family Medicine

## 2016-01-13 ENCOUNTER — Other Ambulatory Visit: Payer: Self-pay | Admitting: Family Medicine

## 2016-01-13 DIAGNOSIS — H70209 Unspecified petrositis, unspecified ear: Secondary | ICD-10-CM

## 2016-01-13 NOTE — Telephone Encounter (Signed)
Will forward to PCP.  Chanise Habeck,CMA  

## 2016-01-13 NOTE — Telephone Encounter (Signed)
Pt called and needs a referral to see Dr. Emeline Darling who is a ENT. She is still having issues with her ear. jw

## 2016-01-13 NOTE — Telephone Encounter (Signed)
Referral completed

## 2016-01-20 ENCOUNTER — Other Ambulatory Visit: Payer: Self-pay | Admitting: Otolaryngology

## 2016-01-20 DIAGNOSIS — H7192 Unspecified cholesteatoma, left ear: Secondary | ICD-10-CM

## 2016-01-20 DIAGNOSIS — H7122 Cholesteatoma of mastoid, left ear: Secondary | ICD-10-CM

## 2016-01-20 DIAGNOSIS — H6692 Otitis media, unspecified, left ear: Secondary | ICD-10-CM

## 2016-01-27 ENCOUNTER — Inpatient Hospital Stay: Admission: RE | Admit: 2016-01-27 | Payer: Medicaid Other | Source: Ambulatory Visit

## 2016-02-03 ENCOUNTER — Other Ambulatory Visit: Payer: Medicaid Other

## 2016-02-04 ENCOUNTER — Telehealth: Payer: Self-pay | Admitting: Family Medicine

## 2016-02-04 ENCOUNTER — Other Ambulatory Visit: Payer: Self-pay | Admitting: Family Medicine

## 2016-02-04 DIAGNOSIS — H70202 Unspecified petrositis, left ear: Secondary | ICD-10-CM

## 2016-02-04 NOTE — Progress Notes (Signed)
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 Can fax to [phone]. She will be seeing Dr, Baldomero Lamy. [place] [facility] already sent their notes.

## 2016-02-04 NOTE — Telephone Encounter (Signed)
Done

## 2016-02-04 NOTE — Telephone Encounter (Signed)
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 Can fax to [phone]. She will be seeing Dr, Zanation. [facility] [facility] already sent their notes. 

## 2016-02-04 NOTE — Telephone Encounter (Signed)
Sent!

## 2016-02-24 ENCOUNTER — Ambulatory Visit (INDEPENDENT_AMBULATORY_CARE_PROVIDER_SITE_OTHER): Payer: Medicaid Other | Admitting: Family Medicine

## 2016-02-24 ENCOUNTER — Encounter: Payer: Self-pay | Admitting: Family Medicine

## 2016-02-24 VITALS — BP 145/86 | HR 96 | Temp 98.5°F | Wt 215.0 lb

## 2016-02-24 DIAGNOSIS — R12 Heartburn: Secondary | ICD-10-CM | POA: Diagnosis not present

## 2016-02-24 DIAGNOSIS — R8290 Unspecified abnormal findings in urine: Secondary | ICD-10-CM | POA: Diagnosis not present

## 2016-02-24 DIAGNOSIS — R079 Chest pain, unspecified: Secondary | ICD-10-CM | POA: Diagnosis not present

## 2016-02-24 DIAGNOSIS — H70202 Unspecified petrositis, left ear: Secondary | ICD-10-CM

## 2016-02-24 LAB — POCT URINALYSIS DIPSTICK
BILIRUBIN UA: NEGATIVE
Glucose, UA: NEGATIVE
KETONES UA: NEGATIVE
Leukocytes, UA: NEGATIVE
Nitrite, UA: POSITIVE
PH UA: 7
Protein, UA: NEGATIVE
RBC UA: NEGATIVE
SPEC GRAV UA: 1.02
Urobilinogen, UA: 1

## 2016-02-24 LAB — POCT UA - MICROSCOPIC ONLY

## 2016-02-24 MED ORDER — OXYCODONE-ACETAMINOPHEN 5-325 MG PO TABS: 1.0000 | ORAL_TABLET | Freq: Four times a day (QID) | ORAL | Status: DC | PRN

## 2016-02-24 NOTE — Patient Instructions (Signed)
Asymptomatic Bacteriuria, Female Asymptomatic bacteriuria is the presence of a large number of bacteria in your urine without the usual symptoms of burning or frequent urination. The following conditions increase the risk of asymptomatic bacteriuria:  Diabetes mellitus.  Advanced age.  Pregnancy in the first trimester.  Kidney stones.  Kidney transplants.  Leaky kidney tube valve in young children (reflux). Treatment for this condition is not needed in most people and can lead to other problems such as too much yeast and growth of resistant bacteria. However, some people, such as pregnant women, do need treatment to prevent kidney infection. Asymptomatic bacteriuria in pregnancy is also associated with fetal growth restriction, premature labor, and newborn death. HOME CARE INSTRUCTIONS Monitor your condition for any changes. The following actions may help to relieve any discomfort you are feeling:  Drink enough water and fluids to keep your urine clear or pale yellow. Go to the bathroom more often to keep your bladder empty.  Keep the area around your vagina and rectum clean. Wipe yourself from front to back after urinating. SEEK IMMEDIATE MEDICAL CARE IF:  You develop signs of an infection such as:  Burning with urination.  Frequency of voiding.  Back pain.  Fever.  You have blood in the urine.  You develop a fever. MAKE SURE YOU:  Understand these instructions.  Will watch your condition.  Will get help right away if you are not doing well or get worse.   This information is not intended to replace advice given to you by your health care provider. Make sure you discuss any questions you have with your health care provider.   Document Released: 11/28/2005 Document Revised: 12/19/2014 Document Reviewed: 05/20/2013 Elsevier Interactive Patient Education 2016 Elsevier Inc.  

## 2016-02-24 NOTE — Assessment & Plan Note (Signed)
To f[facility]u [facility].  Pain meds given to get her through until this appointment.

## 2016-02-24 NOTE — Progress Notes (Signed)
    Subjective:    Patient ID: Lisa CoasterLatonya Cousin is a 45 y.o. female presenting with Ear Pain and urinary odor  on 02/24/2016  HPI: Here for left ear pain. Has previously had surgery and has fluid built up in this ear. Now being seen by [facility] to see if they have something different to offer. Lost her job x 2 wks. Pain feels worse over last few days-weeks, possibly due to cold. Gabapentin is helping but needs pain meds to get through the pain.Has [facility].  Notes odor in her urine. Husband recently came back home and has resumed sexual activity.  Review of Systems  Constitutional: Negative for fever and chills.  Respiratory: Negative for shortness of breath.   Cardiovascular: Negative for chest pain.  Gastrointestinal: Negative for nausea, vomiting and abdominal pain.  Genitourinary: Negative for dysuria.  Skin: Negative for rash.  Neurological: Positive for headaches.      Objective:    BP 145/86 mmHg  Pulse 96  Temp(Src) 98.5 F (36.9 C) (Oral)  Wt 215 lb (97.523 kg)  LMP 01/27/2016 (Approximate) Physical Exam  Constitutional: She is oriented to person, place, and time. She appears well-developed and well-nourished.  HENT:  Right Ear: Hearing, tympanic membrane and external ear normal.  Left TM is swollen and occluding canal patially. Drum is visualized.  Pulmonary/Chest: Effort normal. She has no wheezes.  Abdominal: Soft. There is no tenderness.  Neurological: She is alert and oriented to person, place, and time.  Skin: Skin is warm.   Urinalysis    Component Value Date/Time   COLORURINE YELLOW 11/29/2014 0627   APPEARANCEUR CLEAR 11/29/2014 0627   LABSPEC 1.025 11/29/2014 0627   PHURINE 8.0 11/29/2014 0627   GLUCOSEU NEGATIVE 11/29/2014 0627   HGBUR NEGATIVE 11/29/2014 0627   HGBUR negative 06/23/2010 0953   BILIRUBINUR NEG 02/24/2016 1601   BILIRUBINUR NEGATIVE 11/29/2014 0627   KETONESUR NEGATIVE 11/29/2014 0627   PROTEINUR NEG  02/24/2016 1601   PROTEINUR NEGATIVE 11/29/2014 0627   UROBILINOGEN 1.0 02/24/2016 1601   UROBILINOGEN 1.0 11/29/2014 0627   NITRITE POS 02/24/2016 1601   NITRITE NEGATIVE 11/29/2014 0627   LEUKOCYTESUR Negative 02/24/2016 1601          Assessment & Plan:   Problem List Items Addressed This Visit      Unprioritized   Osteomyelitis of petrous bone    To [facility].  Pain meds given to get her through until this appointment.      Relevant Medications   oxyCODONE-acetaminophen (PERCOCET/ROXICET) 5-325 MG tablet   Other Relevant Orders   Urine culture    Other Visit Diagnoses    Abnormal urine odor    -  Primary    + nitrates in urine---check culture and treat appropriately    Relevant Orders    POCT urinalysis dipstick (Completed)    POCT UA - Microscopic Only (Completed)        Return in about 3 months (around 05/26/2016) for a follow-up with PCP.  PRATT,TANYA S 02/24/2016 4:22 PM

## 2016-02-26 ENCOUNTER — Telehealth: Payer: Self-pay | Admitting: *Deleted

## 2016-02-26 DIAGNOSIS — N390 Urinary tract infection, site not specified: Secondary | ICD-10-CM

## 2016-02-26 MED ORDER — CIPROFLOXACIN HCL 500 MG PO TABS: 500.0000 mg | ORAL_TABLET | Freq: Two times a day (BID) | ORAL | Status: DC

## 2016-02-26 NOTE — Telephone Encounter (Signed)
-----   Message from Reva Boresanya S Pratt, MD sent at 02/26/2016  8:02 AM EDT ----- Has UTI--please send in Cipro 500 mg po bid x 3 d # 6

## 2016-02-26 NOTE — Telephone Encounter (Signed)
Patient is aware of results. Justyce Yeater,CMA  

## 2016-02-26 NOTE — Telephone Encounter (Signed)
Katrina, Pharmacist from [facility] called regarding Rx for pain medication given to patient by Dr. Shawnie PonsPratt on 02/24/16.  Per Katrina patient had a 15 day supply of Norco 5-325 filled.  The Norco was written by Dr. Delfino LovettEric Oliver of [facility].  In the last 6 months they have filled C2 medications all from different providers.  Patient also did not want them to bill medicaid for the prescriptions.  Precept with Dr. Gwendolyn GrantWalden, patient can not have the Rx filled until the 15 days is up.  Send note to PCP to be aware.  Dr. Gwendolyn GrantWalden tried to look patient up in the system for control medications but was unable to bring patient name up.  Katrina was going to call patient to let her she can not get Rx filled until 15 days is up.  Please give pharmacist at [facility] a call at [phone].  Clovis PuMartin, Tamika L, RN

## 2016-02-26 NOTE — Telephone Encounter (Signed)
LM for patient to call back.  Will inform patient of results and that I have already sent over the medication to her pharmacy on file when I hear from her.  Jazmin Hartsell,CMA

## 2016-02-27 LAB — URINE CULTURE: Colony Count: >=100000

## 2016-03-01 NOTE — Telephone Encounter (Signed)
I called patient and spoke with her about this issue. She stated Norco was not effective for her pain so she got another prescription for percocet from Dr. Shawnie PonsPratt. I asked her about requesting to pay out of pocket instead of using insurance, she said she was worried about her insurance locking her in to receive refill from only one provider since she has been getting refill from multiple provider. I told her we would not be refilling opioid from this practice. She agreed with plan.

## 2016-04-04 ENCOUNTER — Encounter: Payer: Self-pay | Admitting: Family Medicine

## 2016-04-04 NOTE — Progress Notes (Signed)
Patient ID: Lisa CoasterLatonya Cousin, female   DOB: [birth year], 45 y.o.   MRN: [id number] I got a report of patient's ENT assessment at [facility] DOS: 03/15/16. The report mentioned depression as a cause of her persistent pain in addition to neuropathic pain. I agree with Gabapentin for pain. I will recommend not to continue refilling Narcotic for her pain since it is more a neuropathic pain. Per documentation, she was advised to follow up with neurologist for further management of her pain.  For her depression, she is on antidepressant. I mailed a letter to her to be compliant with meds and I also gave a list of psychiatrist for her to make follow up appointment with as soon as possible for continuous management of her depression. I will continue to manage her other health concern (HTN).

## 2016-04-06 ENCOUNTER — Other Ambulatory Visit: Payer: Self-pay | Admitting: Family Medicine

## 2016-04-06 DIAGNOSIS — H9209 Otalgia, unspecified ear: Secondary | ICD-10-CM

## 2016-04-06 DIAGNOSIS — H70209 Unspecified petrositis, unspecified ear: Secondary | ICD-10-CM

## 2016-04-06 NOTE — Telephone Encounter (Signed)
I got a refill request message for Zoloft. Per record she was [facility] at [facility] and that is what she is taking. I contacted her pharmacy for clarification. I was informed she picked up both Cymbalta and Zoloft last in January.  I contacted patient to clarify her medication. She stated she is not taking Cymbalta. She takes her Zoloft and she is not compliant with it. She also stated she takes Amytriptyline as needed for pain and sleep only. She plan on discussing this meds with her neurologist soon. As discussed with her, she will continue Zoloft daily for depression and not Cymbalta since she is not taking it anyway. She agreed with plan and verbalized understanding.  She also mentioned that she was recently seen by [facility] and she is needed a referral order to them. Order placed.

## 2016-04-12 ENCOUNTER — Other Ambulatory Visit: Payer: Self-pay

## 2016-04-12 DIAGNOSIS — Z1231 Encounter for screening mammogram for malignant neoplasm of breast: Secondary | ICD-10-CM

## 2016-04-18 ENCOUNTER — Encounter: Payer: Self-pay | Admitting: Family Medicine

## 2016-04-18 ENCOUNTER — Ambulatory Visit
Admission: RE | Admit: 2016-04-18 | Discharge: 2016-04-18 | Disposition: A | Discharge status: Home / Self Care | Payer: Medicaid Other | Source: Ambulatory Visit

## 2016-04-18 DIAGNOSIS — Z1231 Encounter for screening mammogram for malignant neoplasm of breast: Secondary | ICD-10-CM

## 2016-04-25 ENCOUNTER — Ambulatory Visit: Payer: Medicaid Other

## 2016-05-06 ENCOUNTER — Other Ambulatory Visit (HOSPITAL_COMMUNITY)
Admission: RE | Admit: 2016-05-06 | Discharge: 2016-05-06 | Disposition: A | Discharge status: Home / Self Care | Payer: Medicaid Other | Source: Ambulatory Visit | Attending: Family Medicine | Admitting: Family Medicine

## 2016-05-06 ENCOUNTER — Ambulatory Visit (INDEPENDENT_AMBULATORY_CARE_PROVIDER_SITE_OTHER): Payer: Medicaid Other | Admitting: Family Medicine

## 2016-05-06 ENCOUNTER — Telehealth: Payer: Self-pay | Admitting: Family Medicine

## 2016-05-06 ENCOUNTER — Encounter: Payer: Self-pay | Admitting: Family Medicine

## 2016-05-06 VITALS — BP 141/88 | HR 63 | Temp 98.3°F | Ht 66.0 in | Wt 201.0 lb

## 2016-05-06 DIAGNOSIS — Z202 Contact with and (suspected) exposure to infections with a predominantly sexual mode of transmission: Secondary | ICD-10-CM

## 2016-05-06 DIAGNOSIS — Z113 Encounter for screening for infections with a predominantly sexual mode of transmission: Secondary | ICD-10-CM | POA: Diagnosis not present

## 2016-05-06 DIAGNOSIS — N898 Other specified noninflammatory disorders of vagina: Secondary | ICD-10-CM | POA: Diagnosis not present

## 2016-05-06 DIAGNOSIS — IMO0001 Reserved for inherently not codable concepts without codable children: Secondary | ICD-10-CM

## 2016-05-06 DIAGNOSIS — Z13220 Encounter for screening for lipoid disorders: Secondary | ICD-10-CM | POA: Diagnosis not present

## 2016-05-06 DIAGNOSIS — Z0000 Encounter for general adult medical examination without abnormal findings: Secondary | ICD-10-CM | POA: Diagnosis present

## 2016-05-06 LAB — POCT WET PREP (WET MOUNT): Clue Cells Wet Prep Whiff POC: POSITIVE

## 2016-05-06 LAB — LIPID PANEL
CHOL/HDL RATIO: 6.5 ratio — AB (ref <=5.0)
Cholesterol: 258 mg/dL — ABNORMAL HIGH (ref 125–200)
HDL: 40 mg/dL — AB (ref >=46)
LDL CALC: 188 mg/dL — AB (ref <130)
TRIGLYCERIDES: 151 mg/dL — AB (ref <150)
VLDL: 30 mg/dL (ref <30)

## 2016-05-06 MED ORDER — METRONIDAZOLE 0.75 % VA GEL: 1.0000 | Freq: Every day | VAGINAL | Status: AC

## 2016-05-06 MED ORDER — CEFTRIAXONE SODIUM 1 G IJ SOLR
250.0000 mg | Freq: Once | INTRAMUSCULAR | Status: AC
  Administered 2016-05-06: 250 mg via INTRAMUSCULAR

## 2016-05-06 MED ORDER — AZITHROMYCIN 500 MG PO TABS
1000.0000 mg | ORAL_TABLET | Freq: Once | ORAL | Status: AC
  Administered 2016-05-06: 1000 mg via ORAL

## 2016-05-06 NOTE — Patient Instructions (Signed)
It was nice seeing you today. We checked you for STD. Since you are having symptoms. I went ahead and treated you with Ceftriaxone  and Zithromax 1 gram. I will call you with result.  Sexually Transmitted Disease A sexually transmitted disease (STD) is a disease or infection that may be passed (transmitted) from person to person, usually during sexual activity. This may happen by way of saliva, semen, blood, vaginal mucus, or urine. Common STDs include:  Gonorrhea.  Chlamydia.  Syphilis.  HIV and AIDS.  Genital herpes.  Hepatitis B and C.  Trichomonas.  Human papillomavirus (HPV).  Pubic lice.  Scabies.  Mites.  Bacterial vaginosis. WHAT ARE CAUSES OF STDs? An STD may be caused by bacteria, a virus, or parasites. STDs are often transmitted during sexual activity if one person is infected. However, they may also be transmitted through nonsexual means. STDs may be transmitted after:   Sexual intercourse with an infected person.  Sharing sex toys with an infected person.  Sharing needles with an infected person or using unclean piercing or tattoo needles.  Having intimate contact with the genitals, mouth, or rectal areas of an infected person.  Exposure to infected fluids during birth. WHAT ARE THE SIGNS AND SYMPTOMS OF STDs? Different STDs have different symptoms. Some people may not have any symptoms. If symptoms are present, they may include:  Painful or bloody urination.  Pain in the pelvis, abdomen, vagina, anus, throat, or eyes.  A skin rash, itching, or irritation.  Growths, ulcerations, blisters, or sores in the genital and anal areas.  Abnormal vaginal discharge with or without bad odor.  Penile discharge in men.  Fever.  Pain or bleeding during sexual intercourse.  Swollen glands in the groin area.  Yellow skin and eyes (jaundice). This is seen with hepatitis.  Swollen testicles.  Infertility.  Sores and blisters in the mouth. HOW ARE  STDs DIAGNOSED? To make a diagnosis, your health care provider may:  Take a medical history.  Perform a physical exam.  Take a sample of any discharge to examine.  Swab the throat, cervix, opening to the penis, rectum, or vagina for testing.  Test a sample of your first morning urine.  Perform blood tests.  Perform a Pap test, if this applies.  Perform a colposcopy.  Perform a laparoscopy. HOW ARE STDs TREATED? Treatment depends on the STD. Some STDs may be treated but not cured.  Chlamydia, gonorrhea, trichomonas, and syphilis can be cured with antibiotic medicine.  Genital herpes, hepatitis, and HIV can be treated, but not cured, with prescribed medicines. The medicines lessen symptoms.  Genital warts from HPV can be treated with medicine or by freezing, burning (electrocautery), or surgery. Warts may come back.  HPV cannot be cured with medicine or surgery. However, abnormal areas may be removed from the cervix, vagina, or vulva.  If your diagnosis is confirmed, your recent sexual partners need treatment. This is true even if they are symptom-free or have a negative culture or evaluation. They should not have sex until their health care providers say it is okay.  Your health care provider may test you for infection again 3 months after treatment. HOW CAN I REDUCE MY RISK OF GETTING AN STD? Take these steps to reduce your risk of getting an STD:  Use latex condoms, dental dams, and water-soluble lubricants during sexual activity. Do not use petroleum jelly or oils.  Avoid having multiple sex partners.  Do not have sex with someone who has other sex  partners  Do not have sex with anyone you do not know or who is at high risk for an STD.  Avoid risky sex practices that can break your skin.  Do not have sex if you have open sores on your mouth or skin.  Avoid drinking too much alcohol or taking illegal drugs. Alcohol and drugs can affect your judgment and put you in a  vulnerable position.  Avoid engaging in oral and anal sex acts.  Get vaccinated for HPV and hepatitis. If you have not received these vaccines in the past, talk to your health care provider about whether one or both might be right for you.  If you are at risk of being infected with HIV, it is recommended that you take a prescription medicine daily to prevent HIV infection. This is called pre-exposure prophylaxis (PrEP). You are considered at risk if:  You are a man who has sex with other men (MSM).  You are a heterosexual man or woman and are sexually active with more than one partner.  You take drugs by injection.  You are sexually active with a partner who has HIV.  Talk with your health care provider about whether you are at high risk of being infected with HIV. If you choose to begin PrEP, you should first be tested for HIV. You should then be tested every 3 months for as long as you are taking PrEP. WHAT SHOULD I DO IF I THINK I HAVE AN STD?  See your health care provider.  Tell your sexual partner(s). They should be tested and treated for any STDs.  Do not have sex until your health care provider says it is okay. WHEN SHOULD I GET IMMEDIATE MEDICAL CARE? Contact your health care provider right away if:   You have severe abdominal pain.  You are a man and notice swelling or pain in your testicles.  You are a woman and notice swelling or pain in your vagina.   This information is not intended to replace advice given to you by your health care provider. Make sure you discuss any questions you have with your health care provider.   Document Released: 02/18/2003 Document Revised: 12/19/2014 Document Reviewed: 06/18/2013 Elsevier Interactive Patient Education [employer]2016 Elsevier Inc.

## 2016-05-06 NOTE — Progress Notes (Signed)
Patient ID: Lisa Newton, female   DOB: 03/05/1971, 45 y.o.   MRN: [id number] Subjective:     Lisa Newton is a 45 y.o. female and is here for a comprehensive physical exam. The patient reports problems - her husband just got out of jail in Jan and since then he has been sleeping with other women. He also got another woman pregnant recently. She stated she noticed vaginal discharge intermittently and suprapubic pain in the last few days..  Social History   Social History  . Marital Status: Single    Spouse Name: N/A  . Number of Children: N/A  . Years of Education: N/A   Occupational History  . Not on file.   Social History Main Topics  . Smoking status: Never Smoker   . Smokeless tobacco: Not on file  . Alcohol Use: No  . Drug Use: No  . Sexual Activity: Yes    Birth Control/ Protection: None   Other Topics Concern  . Not on file   Social History Narrative   Has 3 children, 44 year old daughter, high school son, grown daughter with a baby.   Health Maintenance  Topic Date Due  . TETANUS/TDAP  03/12/2014  . INFLUENZA VACCINE  07/12/2016  . PAP SMEAR  12/19/2016  . MAMMOGRAM  04/18/2017  . HIV Screening  Completed    The following portions of the patient's history were reviewed and updated as appropriate: allergies, current medications, past family history, past medical history, past social history, past surgical history and problem list.  Review of Systems Pertinent items noted in HPI and remainder of comprehensive ROS otherwise negative.   Objective:    BP 141/88 mmHg  Pulse 63  Temp(Src) 98.3 F (36.8 C) (Oral)  Ht 5\' 6"  (1.676 m)  Wt 201 lb (91.173 kg)  BMI 32.46 kg/m2  SpO2 100%  LMP 04/29/2016 General appearance: alert, cooperative and appears stated age Head: Normocephalic, without obvious abnormality, atraumatic Eyes: conjunctivae/corneas clear. PERRL, EOM's intact. Fundi benign. Ears: normal TM's and external ear canals both ears Throat: lips,  mucosa, and tongue normal; teeth and gums normal Neck: no adenopathy, no carotid bruit, no JVD, supple, symmetrical, trachea midline and thyroid not enlarged, symmetric, no tenderness/mass/nodules Lungs: clear to auscultation bilaterally Heart: regular rate and rhythm, S1, S2 normal, no murmur, click, rub or gallop Abdomen: soft, non-tender; bowel sounds normal; no masses,  no organomegaly Pelvic: adnexae not palpable, cervix normal in appearance, uterus normal size, shape, and consistency and ++ frothy milky discharge. Mild suprapubic tenderness with cervical motion. Extremities: extremities normal, atraumatic, no cyanosis or edema Pulses: 2+ and symmetric Skin: Skin color, texture, turgor normal. No rashes or lesions Neurologic: Alert and oriented X 3, normal strength and tone. Normal symmetric reflexes. Normal coordination and gait    Assessment:    Healthy female exam.     Vaginitis/exposure to STDs Plan:     Age appropriate counseling and education done. I recommended Tdap but she declined. She is otherwise up to date with mammogram and PAP. Wet prep doe today shows ++BV. She opted for transvaginal Metronidazole which I sent to her pharmacy. Patient really worried about Gonorrhea and Chlamydia and given mild Cervical Motion Tenderness and long holiday weekend. I gave her the option to wait till result is available but she stated she preferred to be treated now. I went ahead and treat her with Ceftriaxone and Zithromax. I will contact her with final result as soon as I get it.

## 2016-05-06 NOTE — Telephone Encounter (Signed)
Discussed wet prep with her. + BV. I recommended Metronidazole. She does not like taking pills hence PV prescribed instead. Patient informed Metronidazole PV might be OTC but I e-scribed it in case her insurance covers it. She verbalized understanding.

## 2016-05-07 LAB — HIV ANTIBODY (ROUTINE TESTING W REFLEX): HIV: NONREACTIVE

## 2016-05-07 LAB — RPR

## 2016-05-10 ENCOUNTER — Telehealth: Payer: Self-pay | Admitting: Family Medicine

## 2016-05-10 LAB — CERVICOVAGINAL ANCILLARY ONLY
Chlamydia: NEGATIVE
Neisseria Gonorrhea: POSITIVE — AB

## 2016-05-10 NOTE — Telephone Encounter (Signed)
Test result discussed with her. + Gonorrhea. She got treated during last visit. Patient stated she is having suprapubic pain I advised her to come in to be seen soon or go to the ED. I also advised her to inform her partner to get tested and treated as well to prevent reinfection or spread of the disease. She agreed with plan.  FLP result discussed with her. She is not in the Statin group. Diet and exercise counseling done. Recheck FLP in 6 months.

## 2016-05-10 NOTE — Addendum Note (Signed)
Addended by: Steva ColderSCOTT, Carsyn Taubman P on: 05/10/2016 01:39 PM   Modules accepted: [employer]Set

## 2016-05-10 NOTE — Addendum Note (Signed)
Addended by: Steva ColderSCOTT, Dayle Sherpa P on: 05/10/2016 11:45 AM   Modules accepted: [employer]

## 2016-05-11 NOTE — Addendum Note (Signed)
Addended by: Clovis PuMARTIN, Kailee Essman L on: 05/11/2016 08:28 AM   Modules accepted: [employer]

## 2016-06-28 ENCOUNTER — Ambulatory Visit: Payer: Medicaid Other | Admitting: Student

## 2016-07-08 ENCOUNTER — Ambulatory Visit: Payer: Medicaid Other | Admitting: Internal Medicine

## 2016-07-15 ENCOUNTER — Ambulatory Visit (INDEPENDENT_AMBULATORY_CARE_PROVIDER_SITE_OTHER): Payer: Medicaid Other | Admitting: Internal Medicine

## 2016-07-15 ENCOUNTER — Encounter: Payer: Self-pay | Admitting: Internal Medicine

## 2016-07-15 DIAGNOSIS — R634 Abnormal weight loss: Secondary | ICD-10-CM | POA: Diagnosis not present

## 2016-07-15 DIAGNOSIS — R109 Unspecified abdominal pain: Secondary | ICD-10-CM | POA: Diagnosis not present

## 2016-07-15 DIAGNOSIS — T7491XD Unspecified adult maltreatment, confirmed, subsequent encounter: Secondary | ICD-10-CM

## 2016-07-15 NOTE — Assessment & Plan Note (Signed)
Most likely 2/2 ongoing life stressors. Infrequent diarrhea with no nausea, vomiting, or constipation, so less concern for chronic GI etiology.  - Encouraged patient to speak with [facility] Pascal [facility] to discuss stressors and coping mechanisms, however she declined - Continue to monitor at follow-up appointments

## 2016-07-15 NOTE — Patient Instructions (Signed)
It was nice meeting you today Ms. Cousin.   If you would like to speak with a counselor to discuss all the current stress going on in your life, you can contact [facility] [place] at [phone], or [facility] at [phone]. You can also call our office at any time if you feel unsafe or are having feelings or hurting yourself or others.   Be well,  Dr. Natale Milch
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
## 2016-09-17 ENCOUNTER — Encounter (HOSPITAL_COMMUNITY): Payer: Self-pay | Admitting: *Deleted

## 2016-09-17 ENCOUNTER — Emergency Department (HOSPITAL_COMMUNITY)
Admission: EM | Admit: 2016-09-17 | Discharge: 2016-09-17 | Disposition: A | Discharge status: Home / Self Care | Payer: Medicaid Other | Attending: Emergency Medicine | Admitting: Emergency Medicine

## 2016-09-17 DIAGNOSIS — Y999 Unspecified external cause status: Secondary | ICD-10-CM | POA: Diagnosis not present

## 2016-09-17 DIAGNOSIS — Y939 Activity, unspecified: Secondary | ICD-10-CM | POA: Insufficient documentation

## 2016-09-17 DIAGNOSIS — Z91040 Latex allergy status: Secondary | ICD-10-CM | POA: Insufficient documentation

## 2016-09-17 DIAGNOSIS — M7918 Myalgia, other site: Secondary | ICD-10-CM

## 2016-09-17 DIAGNOSIS — I10 Essential (primary) hypertension: Secondary | ICD-10-CM | POA: Diagnosis not present

## 2016-09-17 DIAGNOSIS — M549 Dorsalgia, unspecified: Secondary | ICD-10-CM

## 2016-09-17 DIAGNOSIS — S299XXA Unspecified injury of thorax, initial encounter: Secondary | ICD-10-CM | POA: Diagnosis not present

## 2016-09-17 DIAGNOSIS — Y92410 Unspecified street and highway as the place of occurrence of the external cause: Secondary | ICD-10-CM | POA: Insufficient documentation

## 2016-09-17 MED ORDER — METHOCARBAMOL 500 MG PO TABS
1000.0000 mg | ORAL_TABLET | Freq: Once | ORAL | Status: AC
  Administered 2016-09-17: 1000 mg via ORAL
  Filled 2016-09-17: qty 2

## 2016-09-17 MED ORDER — METHOCARBAMOL 500 MG PO TABS: 500.0000 mg | ORAL_TABLET | Freq: Four times a day (QID) | ORAL | 0 refills | Status: DC | PRN
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
## 2016-09-17 NOTE — ED Notes (Signed)
Declined W/C at D/C and was escorted to lobby by RN. 

## 2016-09-28 ENCOUNTER — Encounter: Payer: Self-pay | Admitting: Internal Medicine

## 2016-09-28 ENCOUNTER — Other Ambulatory Visit (HOSPITAL_COMMUNITY)
Admission: RE | Admit: 2016-09-28 | Discharge: 2016-09-28 | Disposition: A | Discharge status: Home / Self Care | Payer: Medicaid Other | Source: Ambulatory Visit | Attending: Family Medicine | Admitting: Family Medicine

## 2016-09-28 ENCOUNTER — Ambulatory Visit (INDEPENDENT_AMBULATORY_CARE_PROVIDER_SITE_OTHER): Payer: Medicaid Other | Admitting: Internal Medicine

## 2016-09-28 VITALS — BP 134/66 | HR 68 | Temp 98.4°F | Wt 182.0 lb

## 2016-09-28 DIAGNOSIS — N898 Other specified noninflammatory disorders of vagina: Secondary | ICD-10-CM | POA: Diagnosis not present

## 2016-09-28 DIAGNOSIS — M549 Dorsalgia, unspecified: Secondary | ICD-10-CM

## 2016-09-28 DIAGNOSIS — Z202 Contact with and (suspected) exposure to infections with a predominantly sexual mode of transmission: Secondary | ICD-10-CM | POA: Diagnosis not present

## 2016-09-28 DIAGNOSIS — M961 Postlaminectomy syndrome, not elsewhere classified: Secondary | ICD-10-CM

## 2016-09-28 DIAGNOSIS — Z113 Encounter for screening for infections with a predominantly sexual mode of transmission: Secondary | ICD-10-CM | POA: Insufficient documentation

## 2016-09-28 DIAGNOSIS — N760 Acute vaginitis: Secondary | ICD-10-CM | POA: Diagnosis present

## 2016-09-28 HISTORY — DX: Postlaminectomy syndrome, not elsewhere classified: M96.1

## 2016-09-28 LAB — POCT WET PREP (WET MOUNT)
Clue Cells Wet Prep Whiff POC: NEGATIVE
TRICHOMONAS WET PREP HPF POC: ABSENT

## 2016-09-28 MED ORDER — CYCLOBENZAPRINE HCL 5 MG PO TABS: 5.0000 mg | ORAL_TABLET | Freq: Three times a day (TID) | ORAL | 1 refills | Status: DC | PRN

## 2016-09-28 MED ORDER — METRONIDAZOLE 0.75 % VA GEL: 1.0000 | Freq: Every day | VAGINAL | 0 refills | Status: AC

## 2016-09-28 NOTE — Patient Instructions (Signed)
It was so nice to meet you!  I have sent in a prescription for Flexeril to your pharmacy. I will also send in a referral to physical therapy.  I will call with your lab results.  -Dr. Nancy MarusMayo
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
## 2016-09-29 LAB — HEPATITIS C ANTIBODY: HCV Ab: NEGATIVE

## 2016-09-29 LAB — HIV ANTIBODY (ROUTINE TESTING W REFLEX): HIV 1&2 Ab, 4th Generation: NONREACTIVE

## 2016-09-29 LAB — RPR

## 2016-09-30 LAB — URINE CYTOLOGY ANCILLARY ONLY
CHLAMYDIA, DNA PROBE: NEGATIVE
Neisseria Gonorrhea: NEGATIVE
Trichomonas: NEGATIVE

## 2016-11-08 ENCOUNTER — Telehealth: Payer: Self-pay | Admitting: Family Medicine

## 2016-11-08 ENCOUNTER — Emergency Department (HOSPITAL_COMMUNITY)
Admission: EM | Admit: 2016-11-08 | Discharge: 2016-11-08 | Disposition: A | Discharge status: Home / Self Care | Payer: Medicaid Other | Attending: Emergency Medicine | Admitting: Emergency Medicine

## 2016-11-08 ENCOUNTER — Encounter (HOSPITAL_COMMUNITY): Payer: Self-pay | Admitting: Emergency Medicine

## 2016-11-08 ENCOUNTER — Emergency Department (HOSPITAL_COMMUNITY): Payer: Medicaid Other

## 2016-11-08 DIAGNOSIS — Z91040 Latex allergy status: Secondary | ICD-10-CM | POA: Diagnosis not present

## 2016-11-08 DIAGNOSIS — Y9341 Activity, dancing: Secondary | ICD-10-CM | POA: Insufficient documentation

## 2016-11-08 DIAGNOSIS — Y999 Unspecified external cause status: Secondary | ICD-10-CM | POA: Diagnosis not present

## 2016-11-08 DIAGNOSIS — Z79899 Other long term (current) drug therapy: Secondary | ICD-10-CM | POA: Insufficient documentation

## 2016-11-08 DIAGNOSIS — Y929 Unspecified place or not applicable: Secondary | ICD-10-CM | POA: Insufficient documentation

## 2016-11-08 DIAGNOSIS — I10 Essential (primary) hypertension: Secondary | ICD-10-CM | POA: Diagnosis not present

## 2016-11-08 DIAGNOSIS — X509XXA Other and unspecified overexertion or strenuous movements or postures, initial encounter: Secondary | ICD-10-CM | POA: Diagnosis not present

## 2016-11-08 DIAGNOSIS — M25561 Pain in right knee: Secondary | ICD-10-CM

## 2016-11-08 MED ORDER — HYDROCODONE-ACETAMINOPHEN 5-325 MG PO TABS
1.0000 | ORAL_TABLET | Freq: Once | ORAL | Status: AC
  Administered 2016-11-08: 1 via ORAL
  Filled 2016-11-08: qty 1

## 2016-11-08 MED ORDER — IBUPROFEN 400 MG PO TABS: 400.0000 mg | ORAL_TABLET | Freq: Four times a day (QID) | ORAL | 0 refills | Status: DC | PRN

## 2016-11-08 MED ORDER — HYDROCODONE-ACETAMINOPHEN 5-325 MG PO TABS: 1.0000 | ORAL_TABLET | Freq: Four times a day (QID) | ORAL | 0 refills | Status: DC | PRN

## 2016-11-08 NOTE — Discharge Instructions (Signed)
Wear knee immobilizer for at least 2 weeks for stabilization of knee. Use crutches as needed for comfort. Ice and elevate knee throughout the day, using ice pack for no more than 20 minutes every hour. Alternate between ibuprofen and Norco for pain relief. Do not drive or operate machinery with pain medication use. Call orthopedic follow up today or tomorrow to schedule followup appointment for recheck of ongoing knee pain in 1-2 weeks that can be canceled with a 24-48 hour notice if complete resolution of pain. Return to the ER for changes or worsening symptoms.

## 2016-11-08 NOTE — ED Triage Notes (Signed)
Pt c/o pain and swelling in r/knee x 4 hours. Pt was dancing last night and awoke with throbbing pain and swelling in r/knee

## 2016-11-08 NOTE — Telephone Encounter (Signed)
Pt was seen in ED today, was told she needed to be seen by ortho. [facility]. Please advise. Thanks! ep
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
## 2016-12-01 ENCOUNTER — Ambulatory Visit (INDEPENDENT_AMBULATORY_CARE_PROVIDER_SITE_OTHER): Payer: Medicaid Other | Admitting: Internal Medicine

## 2016-12-01 VITALS — BP 130/72 | HR 96 | Temp 98.1°F | Ht 66.0 in | Wt 173.4 lb

## 2016-12-01 DIAGNOSIS — R079 Chest pain, unspecified: Secondary | ICD-10-CM

## 2016-12-01 DIAGNOSIS — M549 Dorsalgia, unspecified: Secondary | ICD-10-CM

## 2016-12-01 MED ORDER — TRAMADOL HCL 50 MG PO TABS: 50.0000 mg | ORAL_TABLET | Freq: Three times a day (TID) | ORAL | 0 refills | Status: DC | PRN

## 2016-12-01 NOTE — Patient Instructions (Addendum)
I will refer you to orthopedics for your back pain. Continue flexeril. I will give you tramadol as well for your back pain. Please follow up in 6 weeks if no improvement

## 2016-12-01 NOTE — Progress Notes (Signed)
   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  Back pain began 1 days ago. Pain is described as across her bilateral shoulders  Patient has tried patient has taken a muscle relaxer, ibuprofen and heating pad today History of trauma or injury: No Prior history of similar pain: Yes states it is worse  History of cancer: No Weak immune system:  No History of IV drug use: No History of steroid use: No  Symptoms Incontinence of bowel or bladder: No Numbness of leg: No  Fever: No  Rest or Night pain: No  Weight Loss: Yes  Rash: No   Patient does not know the cause of her pain  Patient also notes chest pain. She states spreads to her top part of her back. Patient states pain is worse with movement. Feels like the pain is likely due to her back pain as her whole upper body is sore. Noted chest pain in the car today, and now feels that it is worse at the office. Patient states pain radiates to her shoulder/back. No cough or congestion. Patient denies any SOB. Denies any nausea or vomiting. Denies diaphoreisis. Burped a couple of times, however note denies an indigestion. DVT noted on problem list, this was many years ago due to a PICC line; Otherwise not hx of heart disease per patient. No family hx of heart disease.   ROS see HPI Smoking Status noted.  Objective: BP 130/72 (BP Location: Right Arm, Patient Position: Sitting, Cuff Size: Normal)   Pulse 96   Temp 98.1 F (36.7 C) (Oral)   Ht 5\' 6"  (1.676 m)   Wt 173 lb 6.4 oz (78.7 kg)   LMP 10/13/2016 (Approximate)   SpO2 98%   BMI 27.99 kg/m  Gen: NAD, alert, cooperative with exam Cardio: regular rate and rhythm, S1S2 heard, no murmurs appreciated, pain with palpation of chest  Pulm: clear to auscultation bilaterally, no wheezes, rhonchi or rales Back/Shoulder: No  abnormalities noted on inspection of the shoulder and back. Pain with palpation of left shoulder and cervical and thoracic paraspinal muscles. Pain with flexion/extrension of back, pain with in planes with any motion of left shoulder. Normal strength in left shoulder  Skin: dry, intact, no rashes or lesions   Assessment/Plan: See problem based a/p  Upper back pain on left side Likely musculoskeletal pain, possibly from repetative work that she does. Continue Flexeril and ibuprofen. Provided tramadol for break through pain. Patient would like referral to orthopedics, placed. Follow up in six weeks if no improvement    Chest pain Atypical chest, unlikely cardiac as reproducible on exam. Hx of DVT noted in problem list however was due to a PICC line, therefore no concern for embolism given no hx of SOB, normal pulse ox, and pulse.  - Discussed with Dr. Leveda AnnaHensel, no further work up needed.

## 2016-12-02 ENCOUNTER — Encounter: Payer: Self-pay | Admitting: Internal Medicine

## 2016-12-02 ENCOUNTER — Ambulatory Visit (HOSPITAL_COMMUNITY)
Admission: RE | Admit: 2016-12-02 | Discharge: 2016-12-02 | Disposition: A | Discharge status: Home / Self Care | Payer: Medicaid Other | Source: Ambulatory Visit | Attending: Family Medicine | Admitting: Family Medicine

## 2016-12-02 ENCOUNTER — Ambulatory Visit (INDEPENDENT_AMBULATORY_CARE_PROVIDER_SITE_OTHER): Payer: Medicaid Other | Admitting: Internal Medicine

## 2016-12-02 ENCOUNTER — Telehealth: Payer: Self-pay | Admitting: Family Medicine

## 2016-12-02 VITALS — BP 146/74 | HR 81 | Temp 98.3°F | Wt 173.0 lb

## 2016-12-02 DIAGNOSIS — M542 Cervicalgia: Secondary | ICD-10-CM

## 2016-12-02 DIAGNOSIS — M47812 Spondylosis without myelopathy or radiculopathy, cervical region: Secondary | ICD-10-CM | POA: Insufficient documentation

## 2016-12-02 DIAGNOSIS — M25512 Pain in left shoulder: Secondary | ICD-10-CM | POA: Insufficient documentation

## 2016-12-02 DIAGNOSIS — M549 Dorsalgia, unspecified: Secondary | ICD-10-CM

## 2016-12-02 MED ORDER — KETOROLAC TROMETHAMINE 30 MG/ML IJ SOLN
30.0000 mg | Freq: Once | INTRAMUSCULAR | Status: AC
  Administered 2016-12-02: 30 mg via INTRAMUSCULAR

## 2016-12-02 MED ORDER — GABAPENTIN 100 MG PO CAPS: 300.0000 mg | ORAL_CAPSULE | Freq: Three times a day (TID) | ORAL | 0 refills | Status: DC

## 2016-12-02 MED ORDER — KETOROLAC TROMETHAMINE 30 MG/ML IJ SOLN: 30.0000 mg | Freq: Once | INTRAMUSCULAR | Status: DC

## 2016-12-02 NOTE — Telephone Encounter (Signed)
Will forward to Dr. Ottie GlazierGunadasa who saw patient today for xrays. Mahsa Hanser,CMA

## 2016-12-02 NOTE — Telephone Encounter (Signed)
Pt called and would like to know what her x-rays results are. She said that the pain is not getting better even with the Tramadol and gabapentin. What else can she do? jw

## 2016-12-02 NOTE — Assessment & Plan Note (Addendum)
Acute on chronic exacerbation of her pain. Will obtain c-spine x-rays. Will restart Gabapentin 300mg  TID. Continue Flexeril. Orthopedics referral made at last visit.
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
## 2016-12-02 NOTE — Patient Instructions (Addendum)
We will get an xray of your neck. We ordered Gabapentin for your pain. You can continue to take Flexeril as well. Follow up in 4 weeks if no improvement. Make sure you go to the appointment with the orthopedic doctor.  After a few days (after the pain calms down a bit), try doing neck stretching exercises.  Neck Exercises Neck exercises can be important for many reasons:  They can help you to improve and maintain flexibility in your neck. This can be especially important as you age.  They can help to make your neck stronger. This can make movement easier.  They can reduce or prevent neck pain.  They may help your upper back. Ask your health care provider which neck exercises would be best for you. Exercises Neck Press  Repeat this exercise 10 times. Do it first thing in the morning and right before bed or as told by your health care provider. 1. Lie on your back on a firm bed or on the floor with a pillow under your head. 2. Use your neck muscles to push your head down on the pillow and straighten your spine. 3. Hold the position as well as you can. Keep your head facing up and your chin tucked. 4. Slowly count to 5 while holding this position. 5. Relax for a few seconds. Then repeat. Isometric Strengthening  Do a full set of these exercises 2 times a day or as told by your health care provider. 1. Sit in a supportive chair and place your hand on your forehead. 2. Push forward with your head and neck while pushing back with your hand. Hold for 10 seconds. 3. Relax. Then repeat the exercise 3 times. 4. Next, do thesequence again, this time putting your hand against the back of your head. Use your head and neck to push backward against the hand pressure. 5. Finally, do the same exercise on either side of your head, pushing sideways against the pressure of your hand. Prone Head Lifts  Repeat this exercise 5 times. Do this 2 times a day or as told by your health care provider. 1. Lie  face-down, resting on your elbows so that your chest and upper back are raised. 2. Start with your head facing downward, near your chest. Position your chin either on or near your chest. 3. Slowly lift your head upward. Lift until you are looking straight ahead. Then continue lifting your head as far back as you can stretch. 4. Hold your head up for 5 seconds. Then slowly lower it to your starting position. Supine Head Lifts  Repeat this exercise 8-10 times. Do this 2 times a day or as told by your health care provider. 1. Lie on your back, bending your knees to point to the ceiling and keeping your feet flat on the floor. 2. Lift your head slowly off the floor, raising your chin toward your chest. 3. Hold for 5 seconds. 4. Relax and repeat. Scapular Retraction  Repeat this exercise 5 times. Do this 2 times a day or as told by your health care provider. 1. Stand with your arms at your sides. Look straight ahead. 2. Slowly pull both shoulders backward and downward until you feel a stretch between your shoulder blades in your upper back. 3. Hold for 10-30 seconds. 4. Relax and repeat. Contact a health care provider if:  Your neck pain or discomfort gets much worse when you do an exercise.  Your neck pain or discomfort does not improve within  2 hours after you exercise. If you have any of these problems, stop exercising right away. Do not do the exercises again unless your health care provider says that you can. Get help right away if:  You develop sudden, severe neck pain. If this happens, stop exercising right away. Do not do the exercises again unless your health care provider says that you can. Exercises Neck Stretch  Repeat this exercise 3-5 times. 1. Do this exercise while standing or while sitting in a chair. 2. Place your feet flat on the floor, shoulder-width apart. 3. Slowly turn your head to the right. Turn it all the way to the right so you can look over your right shoulder. Do  not tilt or tip your head. 4. Hold this position for 10-30 seconds. 5. Slowly turn your head to the left, to look over your left shoulder. 6. Hold this position for 10-30 seconds. Neck Retraction  Repeat this exercise 8-10 times. Do this 3-4 times a day or as told by your health care provider. 1. Do this exercise while standing or while sitting in a sturdy chair. 2. Look straight ahead. Do not bend your neck. 3. Use your fingers to push your chin backward. Do not bend your neck for this movement. Continue to face straight ahead. If you are doing the exercise properly, you will feel a slight sensation in your throat and a stretch at the back of your neck. 4. Hold the stretch for 1-2 seconds. Relax and repeat. This information is not intended to replace advice given to you by your health care provider. Make sure you discuss any questions you have with your health care provider. Document Released: 11/09/2015 Document Revised: 05/05/2016 Document Reviewed: 06/08/2015 Elsevier Interactive Patient Education  2017 [employer]

## 2016-12-02 NOTE — Telephone Encounter (Signed)
Called patient to discuss results of the x-ray and that there were no other pain medications other than what she is on that I can offer. Asked her to keep the appointment with orthopedics.

## 2016-12-03 ENCOUNTER — Emergency Department (HOSPITAL_COMMUNITY)
Admission: EM | Admit: 2016-12-03 | Discharge: 2016-12-03 | Disposition: A | Discharge status: Home / Self Care | Payer: Medicaid Other | Attending: Emergency Medicine | Admitting: Emergency Medicine

## 2016-12-03 ENCOUNTER — Encounter (HOSPITAL_COMMUNITY): Payer: Self-pay

## 2016-12-03 ENCOUNTER — Other Ambulatory Visit: Payer: Self-pay

## 2016-12-03 ENCOUNTER — Emergency Department (HOSPITAL_COMMUNITY): Payer: Medicaid Other

## 2016-12-03 DIAGNOSIS — Z79899 Other long term (current) drug therapy: Secondary | ICD-10-CM | POA: Insufficient documentation

## 2016-12-03 DIAGNOSIS — R202 Paresthesia of skin: Secondary | ICD-10-CM | POA: Diagnosis not present

## 2016-12-03 DIAGNOSIS — Z91040 Latex allergy status: Secondary | ICD-10-CM | POA: Diagnosis not present

## 2016-12-03 DIAGNOSIS — I10 Essential (primary) hypertension: Secondary | ICD-10-CM | POA: Insufficient documentation

## 2016-12-03 DIAGNOSIS — M25512 Pain in left shoulder: Secondary | ICD-10-CM | POA: Diagnosis not present

## 2016-12-03 NOTE — Discharge Instructions (Signed)
Please follow-up with:   [id number]   [address]   [place], [place] [address]   Phone: [phone]   Fax: [phone]    Please read attached information. If you experience any new or worsening signs or symptoms please return to the emergency room for evaluation. Please follow-up with your primary care provider or specialist as discussed. Please use medication prescribed only as directed and discontinue taking if you have any concerning signs or symptoms.

## 2016-12-03 NOTE — ED Notes (Signed)
Family at bedside. 
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
## 2016-12-03 NOTE — ED Notes (Signed)
Patient aware of importance of keeping all upcoming appointments. She verbalized understanding.

## 2016-12-03 NOTE — ED Notes (Signed)
Patient states pain is in Left side of neck and radiates down her left shoulder and arm. States "they have done work-ups at my doctor's office and said I had arthritis. I am scheduled to go see an orthopedic doctor on this coming Friday and to the ear doctor on Thursday. 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

## 2016-12-03 NOTE — ED Triage Notes (Addendum)
Pt c/o L ear pain radiating into L neck, L scapula, and down L arm x 5 days.  Sts L arm feels like "warm numbness."  Pain score 10/10.  Pain increases w/ movement.  Denies injury.  Pt reports being seen by PCP previsously for same.  Sts no relief w/ pain medication.  Pt reports that she started a job where she does repetitive movements in September.

## 2016-12-07 ENCOUNTER — Ambulatory Visit (INDEPENDENT_AMBULATORY_CARE_PROVIDER_SITE_OTHER): Payer: Medicaid Other | Admitting: Internal Medicine

## 2016-12-07 ENCOUNTER — Encounter: Payer: Self-pay | Admitting: Internal Medicine

## 2016-12-07 ENCOUNTER — Telehealth: Payer: Self-pay | Admitting: *Deleted

## 2016-12-07 ENCOUNTER — Other Ambulatory Visit: Payer: Self-pay | Admitting: *Deleted

## 2016-12-07 DIAGNOSIS — M549 Dorsalgia, unspecified: Secondary | ICD-10-CM | POA: Diagnosis not present

## 2016-12-07 DIAGNOSIS — H70202 Unspecified petrositis, left ear: Secondary | ICD-10-CM | POA: Diagnosis present

## 2016-12-07 MED ORDER — CYCLOBENZAPRINE HCL 5 MG PO TABS: 5.0000 mg | ORAL_TABLET | Freq: Three times a day (TID) | ORAL | 0 refills | Status: DC | PRN

## 2016-12-07 MED ORDER — OXYCODONE-ACETAMINOPHEN 5-325 MG PO TABS: 1.0000 | ORAL_TABLET | Freq: Three times a day (TID) | ORAL | 0 refills | Status: DC | PRN

## 2016-12-07 NOTE — Progress Notes (Signed)
[facility] Progress Note  Subjective:  Lisa CoasterLatonya Newton is a 45 y.o. female who presents for continued pain of L neck, back, and arm. She was seen for this in clinic 12/21 and 12/22 and has had 2 ED visits for the same. She is frustrated because she does not feel she has a good explanation for why she is hurting so much. Pain distribution is from the left side of her neck, down her shoulder and arm and into the front of his chest. She reports numbness across L arm in changing distributions. Has been occurring for last couple of weeks. For the past couple of days, she has had to stay in bed due to pain. Imaging of C-spine 12/22 showed degenerative changes with disc flattening at C4-5 and C5-6. 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 No known trauma or specific incident inciting pain. Had to quit her job due to pain. She states nothing has helped the pain, and it remains at a 10 out of 10. She has tried gabapentin 300 mg TID, flexeril, tramadol, heat and ice. Toradol shot at last visit did not help. Her PCP refilled flexeril today. She is to be evaluated by Orthopedic Surgery 12/28.  ROS: No extremity weakness, no HAs, no rashes  Objective: Blood pressure (!) 128/98, pulse 85, temperature 98.5 F (36.9 C), temperature source Oral, height 5\' 6"  (1.676 m), weight 179 lb 3.2 oz (81.3 kg), last menstrual period 11/23/2016, SpO2 98 %. Constitutional: Uncomfortable appearing female in NAD HENT: NCAT Cardiovascular: RRR, S1, S2, no m/r/g.  Pulmonary/Chest: Effort normal and breath sounds normal. No respiratory distress.  Musculoskeletal: TTP over L trapezius. FROM of neck. Positive Spurling's test, looking left. Mild TTP over mid C-spine. TTP over L chest wall. Apley's scratch test limited by pain on L.  Neurological: AOx3, equal hand grip bilaterally, sensation intact over left hand and arm Skin: Skin is warm and dry. No rash noted.   Vitals  reviewed  Assessment/Plan: Upper back pain on left side - Exam consistent with muscle strain and radiculopathy, with recent imaging supporting degenerative changes of C-spine - Advised patient to continue gabapentin, as it is one of the better medications for nerve pain. She no longer is taking amitriptyline, but I counseled this would likely be a helpful supplemental medication if pain continues.  - Continue flexeril, already refilled by PCP today. - Did not refill tramadol as has not helped. Provided short term prescription of percocet 5-325 mg (#15 pills) for acute pain until patient sees Orthopedics to discuss possible spinal injections. Counseled patient opioids would not be a good long-term medication for her symptoms and can increase risk of death and that I would not recommend further refills. Patient is in process of trying to establish with a pain clinic.  - Suspect Orthopedic Surgery will want to obtain MRI of spine. If not performed 12/28, PCP may want to consider ordering at follow-up appointment next week.   Follow-up early next week to discuss long-term pain management plan.  Dani GobbleHillary Fitzgerald, MD [facility], PGY-2

## 2016-12-07 NOTE — Telephone Encounter (Signed)
Spoke with patient and she is aware of message.  She is in clinic today to be seen for same day. Jazmin Hartsell,CMA

## 2016-12-07 NOTE — Patient Instructions (Signed)
Ms. Cherly HensenCousins,  I agree that your pain is coming from your spine.  I have prescribed a short course of percocet to help you get to your next appointment.  Please follow-up early next week with Dr. Lum BabeEniola to determine long-term plan.  Best, Dr. Sampson GoonFitzgerald

## 2016-12-07 NOTE — Telephone Encounter (Signed)
-----   Message from Doreene ElandKehinde T Eniola, MD sent at 12/07/2016 10:32 AM EST ----- Please advise patient. Will prefer not to refill Tramadol at this. It was only prescribed for a short period by Dr Cathlean CowerMikell. Follow up with orthopedic as referred for back pain. See us soon if symptoms worsens.

## 2016-12-08 NOTE — Assessment & Plan Note (Signed)
Atypical chest, unlikely cardiac as reproducible on exam. Hx of DVT noted in problem list however was due to a PICC line, therefore no concern for embolism given no hx of SOB, normal pulse ox, and pulse.  - Discussed with Dr. Leveda AnnaHensel, no further work up needed.

## 2016-12-08 NOTE — Assessment & Plan Note (Addendum)
-   Exam consistent with muscle strain and radiculopathy, with recent imaging supporting degenerative changes of C-spine - Advised patient to continue gabapentin, as it is one of the better medications for nerve pain. She no longer is taking amitriptyline, but I counseled this would likely be a helpful supplemental medication if pain continues.  - Continue flexeril, already refilled by PCP today. - Did not refill tramadol as has not helped. Provided short term prescription of percocet 5-325 mg (#15 pills) for acute pain until patient sees Orthopedics to discuss possible spinal injections. Counseled patient opioids would not be a good long-term medication for her symptoms and can increase risk of death and that I would not recommend further refills. Patient is in process of trying to establish with a pain clinic.  - Suspect Orthopedic Surgery will want to obtain MRI of spine. If not performed 12/28, PCP may want to consider ordering at follow-up appointment next week.

## 2016-12-08 NOTE — Assessment & Plan Note (Addendum)
Likely musculoskeletal pain, possibly from repetative work that she does. Continue Flexeril and ibuprofen. Provided tramadol for break through pain. Patient would like referral to orthopedics, placed. Follow up in six weeks if no improvement

## 2016-12-09 ENCOUNTER — Ambulatory Visit (INDEPENDENT_AMBULATORY_CARE_PROVIDER_SITE_OTHER): Payer: Medicaid Other | Admitting: Orthopaedic Surgery

## 2016-12-09 DIAGNOSIS — M5412 Radiculopathy, cervical region: Secondary | ICD-10-CM

## 2016-12-09 MED ORDER — DICLOFENAC SODIUM 75 MG PO TBEC: 75.0000 mg | DELAYED_RELEASE_TABLET | Freq: Two times a day (BID) | ORAL | 2 refills | Status: DC

## 2016-12-09 MED ORDER — METHOCARBAMOL 500 MG PO TABS: 500.0000 mg | ORAL_TABLET | Freq: Four times a day (QID) | ORAL | 2 refills | Status: DC | PRN

## 2016-12-09 MED ORDER — PREDNISONE 10 MG (21) PO TBPK: ORAL_TABLET | ORAL | 0 refills | Status: DC
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
## 2016-12-13 ENCOUNTER — Telehealth (INDEPENDENT_AMBULATORY_CARE_PROVIDER_SITE_OTHER): Payer: Self-pay | Admitting: Orthopaedic Surgery

## 2016-12-13 NOTE — Telephone Encounter (Signed)
Please advise, if so what would you like to order?

## 2016-12-13 NOTE — Telephone Encounter (Signed)
MRI to r/o HNP

## 2016-12-13 NOTE — Telephone Encounter (Signed)
Patient says her medication is not working. She is requesting to move forward with her Mri. Cb#: [phone]

## 2016-12-14 ENCOUNTER — Other Ambulatory Visit (INDEPENDENT_AMBULATORY_CARE_PROVIDER_SITE_OTHER): Payer: Self-pay

## 2016-12-14 DIAGNOSIS — M542 Cervicalgia: Secondary | ICD-10-CM

## 2016-12-14 NOTE — Telephone Encounter (Signed)
ORDER WAS MADE. THEY WILL BE GIVING HER A CALL TO SCHEDULE APPT FOR MRI. LMOM WITH DETAILS

## 2016-12-19 ENCOUNTER — Ambulatory Visit: Payer: Medicaid Other | Admitting: Physical Therapy

## 2016-12-19 ENCOUNTER — Ambulatory Visit
Admission: RE | Admit: 2016-12-19 | Discharge: 2016-12-19 | Disposition: A | Discharge status: Home / Self Care | Payer: Medicaid Other | Source: Ambulatory Visit | Attending: Orthopaedic Surgery | Admitting: Orthopaedic Surgery

## 2016-12-19 DIAGNOSIS — M542 Cervicalgia: Secondary | ICD-10-CM

## 2016-12-20 ENCOUNTER — Telehealth (INDEPENDENT_AMBULATORY_CARE_PROVIDER_SITE_OTHER): Payer: Self-pay | Admitting: Orthopaedic Surgery

## 2016-12-20 MED ORDER — HYDROCODONE-ACETAMINOPHEN 5-325 MG PO TABS: ORAL_TABLET | ORAL | 0 refills | Status: DC

## 2016-12-20 NOTE — Telephone Encounter (Signed)
Refill #5 of norco

## 2016-12-20 NOTE — Telephone Encounter (Signed)
Please advise 

## 2016-12-20 NOTE — Telephone Encounter (Signed)
Patient called advised she had MRI yesterday and scheduled an appointment 12/22/16 for MRI review.   Patient said the pain medication she was taking is not working. Patient asked if she can get something to help with the pain. Patient said she have not been able to sleep. Patient said she is using hot and cold and that does not seem to be working. Patient said the lady that did her MRI said there is still swelling in her shoulder and upper back. The number to contact her is [phone]

## 2016-12-20 NOTE — Telephone Encounter (Signed)
rx ready for pickup 

## 2016-12-20 NOTE — Addendum Note (Signed)
Addended by: Albertina ParrGARCIA, Shahrzad Koble on: 12/20/2016 02:53 PM   Modules accepted: Orders

## 2016-12-22 ENCOUNTER — Ambulatory Visit (INDEPENDENT_AMBULATORY_CARE_PROVIDER_SITE_OTHER): Payer: Medicaid Other | Admitting: Orthopaedic Surgery

## 2016-12-22 ENCOUNTER — Encounter (INDEPENDENT_AMBULATORY_CARE_PROVIDER_SITE_OTHER): Payer: Self-pay | Admitting: Orthopaedic Surgery

## 2016-12-22 ENCOUNTER — Other Ambulatory Visit: Payer: Self-pay | Admitting: Internal Medicine

## 2016-12-22 DIAGNOSIS — H70202 Unspecified petrositis, left ear: Secondary | ICD-10-CM

## 2016-12-22 DIAGNOSIS — M5412 Radiculopathy, cervical region: Secondary | ICD-10-CM

## 2016-12-22 HISTORY — DX: Radiculopathy, cervical region: M54.12

## 2016-12-22 MED ORDER — OXYCODONE-ACETAMINOPHEN 5-325 MG PO TABS: 1.0000 | ORAL_TABLET | Freq: Three times a day (TID) | ORAL | 0 refills | Status: DC | PRN
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
## 2016-12-23 ENCOUNTER — Encounter: Payer: Self-pay | Admitting: Family Medicine

## 2016-12-23 ENCOUNTER — Other Ambulatory Visit (HOSPITAL_COMMUNITY)
Admission: RE | Admit: 2016-12-23 | Discharge: 2016-12-23 | Disposition: A | Discharge status: Home / Self Care | Payer: Medicaid Other | Source: Ambulatory Visit | Attending: Family Medicine | Admitting: Family Medicine

## 2016-12-23 ENCOUNTER — Ambulatory Visit (INDEPENDENT_AMBULATORY_CARE_PROVIDER_SITE_OTHER): Payer: Medicaid Other | Admitting: Family Medicine

## 2016-12-23 ENCOUNTER — Telehealth: Payer: Self-pay | Admitting: Family Medicine

## 2016-12-23 VITALS — BP 110/72 | HR 93 | Temp 97.8°F | Wt 181.0 lb

## 2016-12-23 DIAGNOSIS — F1411 Cocaine abuse, in remission: Secondary | ICD-10-CM

## 2016-12-23 DIAGNOSIS — H9202 Otalgia, left ear: Secondary | ICD-10-CM

## 2016-12-23 DIAGNOSIS — M5412 Radiculopathy, cervical region: Secondary | ICD-10-CM

## 2016-12-23 DIAGNOSIS — Z1151 Encounter for screening for human papillomavirus (HPV): Secondary | ICD-10-CM | POA: Insufficient documentation

## 2016-12-23 DIAGNOSIS — Z124 Encounter for screening for malignant neoplasm of cervix: Secondary | ICD-10-CM

## 2016-12-23 DIAGNOSIS — N898 Other specified noninflammatory disorders of vagina: Secondary | ICD-10-CM | POA: Diagnosis not present

## 2016-12-23 DIAGNOSIS — Z01411 Encounter for gynecological examination (general) (routine) with abnormal findings: Secondary | ICD-10-CM | POA: Diagnosis present

## 2016-12-23 DIAGNOSIS — Z113 Encounter for screening for infections with a predominantly sexual mode of transmission: Secondary | ICD-10-CM | POA: Diagnosis present

## 2016-12-23 DIAGNOSIS — Z01419 Encounter for gynecological examination (general) (routine) without abnormal findings: Secondary | ICD-10-CM

## 2016-12-23 LAB — POCT WET PREP (WET MOUNT)
Clue Cells Wet Prep Whiff POC: NEGATIVE
Trichomonas Wet Prep HPF POC: ABSENT

## 2016-12-23 MED ORDER — METRONIDAZOLE 500 MG PO TABS: 500.0000 mg | ORAL_TABLET | Freq: Two times a day (BID) | ORAL | 0 refills | Status: AC

## 2016-12-23 NOTE — Progress Notes (Addendum)
Subjective:     Patient ID: Lisa Newton, female   DOB: 1971-10-20, 46 y.o.   MRN: [id number]  HPI Cervical radiculopathy:C/O Left sided neck pain and left shoulder pain which started few weeks ago. She is upset about this. She was [facility] and she stated she does not want to go back to them. She was seen by orthopedic yesterday who prescribed her opioid. No other concern today. Otalgia:Still having left ear ache. Prevents her from sleeping at night. The only thing that works is opioid. She follow up regularly with her ENT. PAP: here for follow up. Denies any GU concern.  Current Outpatient Prescriptions on File Prior to Visit  Medication Sig Dispense Refill  . Biotin 1000 MCG tablet Take 1,000 mcg by mouth daily.    . cyclobenzaprine (FLEXERIL) 5 MG tablet Take 1 tablet (5 mg total) by mouth 3 (three) times daily as needed for muscle spasms. 30 tablet 0  . diclofenac (VOLTAREN) 75 MG EC tablet Take 1 tablet (75 mg total) by mouth 2 (two) times daily. (Patient not taking: Reported on 12/22/2016) 30 tablet 2  . gabapentin (NEURONTIN) 100 MG capsule Take 3 capsules (300 mg total) by mouth 3 (three) times daily. 270 capsule 0  . HYDROcodone-acetaminophen (NORCO/VICODIN) 5-325 MG tablet 1 TAB PO DAILY (Patient not taking: Reported on 12/22/2016) 5 tablet 0  . ibuprofen (ADVIL,MOTRIN) 400 MG tablet Take 1 tablet (400 mg total) by mouth every 6 (six) hours as needed. 30 tablet 0  . methocarbamol (ROBAXIN) 500 MG tablet Take 1 tablet (500 mg total) by mouth every 6 (six) hours as needed for muscle spasms. 30 tablet 2  . oxyCODONE-acetaminophen (PERCOCET/ROXICET) 5-325 MG tablet Take 1 tablet by mouth every 8 (eight) hours as needed for severe pain. 15 tablet 0  . predniSONE (STERAPRED UNI-PAK 21 TAB) 10 MG (21) TBPK tablet Take as directed (Patient not taking: Reported on 12/22/2016) 21 tablet 0  . sertraline (ZOLOFT) 50 MG tablet TAKE ONE TABLET BY MOUTH ONCE DAILY (Patient not  taking: Reported on 12/22/2016) 30 tablet 3  . traMADol (ULTRAM) 50 MG tablet Take 1 tablet (50 mg total) by mouth every 8 (eight) hours as needed. (Patient not taking: Reported on 12/22/2016) 20 tablet 0   No current facility-administered medications on file prior to visit.    Past Medical History:  Diagnosis Date  . Depression 01/11/2012  . Left ear hearing loss      Review of Systems  HENT: Positive for ear pain. Negative for ear discharge.   Respiratory: Negative.   Cardiovascular: Negative.   Gastrointestinal: Negative.   Musculoskeletal:       Neck and left shoulder pain  All other systems reviewed and are negative.      Objective:   Physical Exam  Constitutional: She appears well-developed. No distress.  HENT:  Head: Normocephalic and atraumatic.  Neck: Normal range of motion. Neck supple.  Cardiovascular: Normal rate, regular rhythm and normal heart sounds.   No murmur heard. Pulmonary/Chest: Effort normal and breath sounds normal. No respiratory distress. She has no wheezes.  Abdominal: Soft. Bowel sounds are normal. She exhibits no distension and no mass. There is no tenderness.  Genitourinary: Vagina normal and uterus normal. Cervix exhibits friability. Cervix exhibits no motion tenderness. Right adnexum displays no mass. Left adnexum displays no mass.  Musculoskeletal: Normal range of motion. She exhibits no edema.       Right shoulder: Normal.  Left shoulder: She exhibits tenderness. She exhibits normal range of motion, no swelling, no crepitus and no deformity.  Neurological: No cranial nerve deficit or sensory deficit.  No sensory loss of Left UL. Mild hyperalgesia with touch.   Nursing note and vitals reviewed.      Assessment:     Cervical radiculopathy Otalgia : Hx of Petrous bone osteomyelitis Gyn exam    Plan:     Check problem list.

## 2016-12-23 NOTE — Patient Instructions (Signed)
It was nice seeing you today. I am sorry about your pain. Let us continue management with the orthopedic and your ENT for now. We will recheck your urine drug screen today. I did your PAP test and check for Gonorrhea and Chlamydia. I will call you with result.

## 2016-12-23 NOTE — Assessment & Plan Note (Signed)
New diagnosis. MRI cervical spine reviewed. She already established care with spine specialist. She was [facility]. Utox was done at the pain clinic suggestive of cocaine positivity. This was discussed with patient and I requested repeat test. She refused to give her urine today despite giving her water to drink and water for almost 30 min. I agree with the pain specialist's documentation that she is at risk for opioid abuse. I will avoid this prescription for this patient.

## 2016-12-23 NOTE — Telephone Encounter (Signed)
I called patient and discussed her wet prep result. Equivocal for BV. Will treat anyway with Metronidazole. I addressed issue of her not giving her urine after I told her about her utox report which showed +cocaine and THC. Patient did not deny use or contest result. As discussed with her, I do not feel comfortable prescribing opioid to her. She verbalized understanding and agreed with plan.

## 2016-12-23 NOTE — Assessment & Plan Note (Signed)
S/P left petrous bone osteomyelitis. Likely due to nerve damage. Continue Gabapentin. Continue ENT follow up. Avoid opioid prescription or minimize it. F/U as needed.

## 2016-12-23 NOTE — Assessment & Plan Note (Addendum)
Some discharge noted on pelvic exam. PAP completed with GC/CHlamydia. I will contact her with results.

## 2016-12-27 ENCOUNTER — Ambulatory Visit (INDEPENDENT_AMBULATORY_CARE_PROVIDER_SITE_OTHER): Payer: Medicaid Other | Admitting: Physical Medicine and Rehabilitation

## 2016-12-27 ENCOUNTER — Encounter (INDEPENDENT_AMBULATORY_CARE_PROVIDER_SITE_OTHER): Payer: Self-pay | Admitting: Physical Medicine and Rehabilitation

## 2016-12-27 VITALS — BP 155/93 | HR 69

## 2016-12-27 DIAGNOSIS — M5412 Radiculopathy, cervical region: Secondary | ICD-10-CM

## 2016-12-27 DIAGNOSIS — H70202 Unspecified petrositis, left ear: Secondary | ICD-10-CM | POA: Diagnosis not present

## 2016-12-27 MED ORDER — OXYCODONE-ACETAMINOPHEN 5-325 MG PO TABS: 1.0000 | ORAL_TABLET | Freq: Three times a day (TID) | ORAL | 0 refills | Status: DC | PRN

## 2016-12-27 MED ORDER — HYDROXYZINE PAMOATE 50 MG PO CAPS: ORAL_CAPSULE | ORAL | 0 refills | Status: DC

## 2016-12-27 NOTE — Patient Instructions (Addendum)
May take 600mg  Gabapentin at night.  Cervical Radiculopathy Introduction Cervical radiculopathy happens when a nerve in the neck (cervical nerve) is pinched or bruised. This condition can develop because of an injury or as part of the normal aging process. Pressure on the cervical nerves can cause pain or numbness that runs from the neck all the way down into the arm and fingers. Usually, this condition gets better with rest. Treatment may be needed if the condition does not improve. What are the causes? This condition may be caused by:  Injury.  Slipped (herniated) disk.  Muscle tightness in the neck because of overuse.  Arthritis.  Breakdown or degeneration in the bones and joints of the spine (spondylosis) due to aging.  Bone spurs that may develop near the cervical nerves. What are the signs or symptoms? Symptoms of this condition include:  Pain that runs from the neck to the arm and hand. The pain can be severe or irritating. It may be worse when the neck is moved.  Numbness or weakness in the affected arm and hand. How is this diagnosed? This condition may be diagnosed based on symptoms, medical history, and a physical exam. You may also have tests, including:  X-rays.  CT scan.  MRI.  Electromyogram (EMG).  Nerve conduction tests. How is this treated? In many cases, treatment is not needed for this condition. With rest, the condition usually gets better over time. If treatment is needed, options may include:  Wearing a soft neck collar for short periods of time.  Physical therapy to strengthen your neck muscles.  Medicines, such as NSAIDs, oral corticosteroids, or spinal injections.  Surgery. This may be needed if other treatments do not help. Various types of surgery may be done depending on the cause of your problems. Follow these instructions at home: Managing pain  Take over-the-counter and prescription medicines only as told by your health care  provider.  If directed, apply ice to the affected area.  Put ice in a plastic bag.  Place a towel between your skin and the bag.  Leave the ice on for 20 minutes, 2-3 times per day.  If ice does not help, you can try using heat. Take a warm shower or warm bath, or use a heat pack as told by your health care provider.  Try a gentle neck and shoulder massage to help relieve symptoms. Activity  Rest as needed. Follow instructions from your health care provider about any restrictions on activities.  Do stretching and strengthening exercises as told by your health care provider or physical therapist. General instructions  If you were given a soft collar, wear it as told by your health care provider.  Use a flat pillow when you sleep.  Keep all follow-up visits as told by your health care provider. This is important. Contact a health care provider if:  Your condition does not improve with treatment. Get help right away if:  Your pain gets much worse and cannot be controlled with medicines.  You have weakness or numbness in your hand, arm, face, or leg.  You have a high fever.  You have a stiff, rigid neck.  You lose control of your bowels or your bladder (have incontinence).  You have trouble with walking, balance, or speaking. This information is not intended to replace advice given to you by your health care provider. Make sure you discuss any questions you have with your health care provider. Document Released: 08/23/2001 Document Revised: 05/05/2016 Document Reviewed: 01/22/2015  2017 Elsevier
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
## 2016-12-28 ENCOUNTER — Encounter (INDEPENDENT_AMBULATORY_CARE_PROVIDER_SITE_OTHER): Payer: Self-pay | Admitting: Physical Medicine and Rehabilitation

## 2016-12-28 LAB — CYTOLOGY - PAP
CHLAMYDIA, DNA PROBE: NEGATIVE
Diagnosis: NEGATIVE
HPV: NOT DETECTED
NEISSERIA GONORRHEA: NEGATIVE

## 2016-12-29 ENCOUNTER — Telehealth: Payer: Self-pay | Admitting: Family Medicine

## 2016-12-29 ENCOUNTER — Encounter (INDEPENDENT_AMBULATORY_CARE_PROVIDER_SITE_OTHER): Payer: Medicaid Other | Admitting: Physical Medicine and Rehabilitation

## 2016-12-29 DIAGNOSIS — M5010 Cervical disc disorder with radiculopathy, unspecified cervical region: Secondary | ICD-10-CM

## 2016-12-29 NOTE — Telephone Encounter (Signed)
Thank you and agree with your assessment. Opioid was clearly meant to be short term by Ortho and myself. Did not know about the cocaine use. Epidural injection is scheduled.

## 2016-12-29 NOTE — Telephone Encounter (Signed)
I discussed her PAP result with her.  She again complained of her left sided neck and shoulder pain now radiating to her back and the need for opioid. I advised her, if pain is worsening or radiating to the back she need to go to the ED for assessment since our clinic is closed today.  She then told me she used cocaine weeks back when she was feeling depressed, that was why her initial urine toxicology was positive. She stated she has been clean since then.  I again advised her that since she refused repeat Utox screening at the clinic last week with one previously cocaine positive urine, I will not be prescribing her opioid. She understand this is our clinic policy. I offered referral to pain clinic which she agreed on.  I also reviewed her orthopedic note, it seems they prescribed her opioid during that visit and gave her intraarticular injection.  In my opinion her pain is mostly neuropathic and will benefit more from Gabapentin/Lyrica.  I will defer pain management to her orthopedic and Pain clinic.  Referral made.

## 2016-12-30 ENCOUNTER — Ambulatory Visit (INDEPENDENT_AMBULATORY_CARE_PROVIDER_SITE_OTHER): Payer: Medicaid Other | Admitting: Physical Medicine and Rehabilitation

## 2016-12-30 ENCOUNTER — Ambulatory Visit (INDEPENDENT_AMBULATORY_CARE_PROVIDER_SITE_OTHER): Payer: Medicaid Other | Admitting: Family Medicine

## 2016-12-30 ENCOUNTER — Encounter: Payer: Self-pay | Admitting: Family Medicine

## 2016-12-30 ENCOUNTER — Encounter (INDEPENDENT_AMBULATORY_CARE_PROVIDER_SITE_OTHER): Payer: Self-pay | Admitting: Physical Medicine and Rehabilitation

## 2016-12-30 VITALS — BP 165/100 | HR 81 | Temp 98.1°F | Ht 66.0 in | Wt 188.6 lb

## 2016-12-30 VITALS — BP 165/102 | HR 99 | Temp 98.8°F

## 2016-12-30 DIAGNOSIS — R030 Elevated blood-pressure reading, without diagnosis of hypertension: Secondary | ICD-10-CM

## 2016-12-30 DIAGNOSIS — M5412 Radiculopathy, cervical region: Secondary | ICD-10-CM

## 2016-12-30 DIAGNOSIS — E785 Hyperlipidemia, unspecified: Secondary | ICD-10-CM | POA: Insufficient documentation

## 2016-12-30 DIAGNOSIS — Z131 Encounter for screening for diabetes mellitus: Secondary | ICD-10-CM

## 2016-12-30 DIAGNOSIS — R7309 Other abnormal glucose: Secondary | ICD-10-CM

## 2016-12-30 DIAGNOSIS — I10 Essential (primary) hypertension: Secondary | ICD-10-CM | POA: Diagnosis not present

## 2016-12-30 LAB — LIPID PANEL
CHOLESTEROL: 202 mg/dL — AB (ref <200)
HDL: 46 mg/dL — ABNORMAL LOW (ref >50)
LDL Cholesterol: 116 mg/dL — ABNORMAL HIGH (ref <100)
TRIGLYCERIDES: 199 mg/dL — AB (ref <150)
Total CHOL/HDL Ratio: 4.4 Ratio (ref <5.0)
VLDL: 40 mg/dL — ABNORMAL HIGH (ref <30)

## 2016-12-30 LAB — BASIC METABOLIC PANEL
BUN: 8 mg/dL (ref 7–25)
CHLORIDE: 102 mmol/L (ref 98–110)
CO2: 29 mmol/L (ref 20–31)
Calcium: 9.9 mg/dL (ref 8.6–10.2)
Creat: 0.73 mg/dL (ref 0.50–1.10)
Glucose, Bld: 76 mg/dL (ref 65–99)
POTASSIUM: 3.7 mmol/L (ref 3.5–5.3)
Sodium: 137 mmol/L (ref 135–146)

## 2016-12-30 MED ORDER — AMLODIPINE BESYLATE 5 MG PO TABS: 5.0000 mg | ORAL_TABLET | Freq: Every day | ORAL | 0 refills | Status: DC

## 2016-12-30 MED ORDER — METHYLPREDNISOLONE ACETATE 80 MG/ML IJ SUSP
80.0000 mg | Freq: Once | INTRAMUSCULAR | Status: AC
  Administered 2016-12-30: 80 mg

## 2016-12-30 MED ORDER — LIDOCAINE HCL (PF) 1 % IJ SOLN: 0.3300 mL | Freq: Once | INTRAMUSCULAR | Status: DC

## 2016-12-30 MED ORDER — HYDROCHLOROTHIAZIDE 12.5 MG PO CAPS: 12.5000 mg | ORAL_CAPSULE | Freq: Every day | ORAL | 1 refills | Status: DC

## 2016-12-30 NOTE — Assessment & Plan Note (Addendum)
Bmet and A1C checked.  Note: A1C not done at the lab. Will recheck at next visit.

## 2016-12-30 NOTE — Assessment & Plan Note (Addendum)
BP elevated today. It was rechecked multiple times. ?? Related to pain. However, she has hx of HTN previously on HCTZ. Her BP has also been gradually tapering up over the last few weeks to months. Initially I prescribed Norvasc and later realized that she was previously on HCTZ not Norvasc. I called her pharmacy and canceled Norvasc. I escribed HCTZ. As discussed with patient today. If BP is less than 130/80 she may hold prescribed antihypertensive agent. She agreed with plan and verbalized understanding.
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
## 2016-12-30 NOTE — Patient Instructions (Signed)
Hypertension Hypertension is another name for high blood pressure. High blood pressure forces your heart to work harder to pump blood. A blood pressure reading has two numbers, which includes a higher number over a lower number (example: 110/72). Follow these instructions at home:  Have your blood pressure rechecked by your doctor.  Only take medicine as told by your doctor. Follow the directions carefully. The medicine does not work as well if you skip doses. Skipping doses also puts you at risk for problems.  Do not smoke.  Monitor your blood pressure at home as told by your doctor. Contact a doctor if:  You think you are having a reaction to the medicine you are taking.  You have repeat headaches or feel dizzy.  You have puffiness (swelling) in your ankles.  You have trouble with your vision. Get help right away if:  You get a very bad headache and are confused.  You feel weak, numb, or faint.  You get chest or belly (abdominal) pain.  You throw up (vomit).  You cannot breathe very well. This information is not intended to replace advice given to you by your health care provider. Make sure you discuss any questions you have with your health care provider. Document Released: 05/16/2008 Document Revised: 05/05/2016 Document Reviewed: 09/20/2013 Elsevier Interactive Patient Education  2017 Elsevier Inc.  

## 2016-12-30 NOTE — Patient Instructions (Signed)

## 2016-12-30 NOTE — Assessment & Plan Note (Addendum)
On opioid and she has an appointment for steroid injection today with Dr. Alvester MorinNewton. Referral already placed to pain clinic. EMG discussed with her and ordered today. I will complete her disability form.

## 2016-12-30 NOTE — Assessment & Plan Note (Signed)
Abnormal FLP in May of 2017. Diet and exercise change discussed then for 6 months. FLP repeated today. I will call her with result.

## 2016-12-30 NOTE — Progress Notes (Signed)
Subjective:     Patient ID: Lisa Newton, female   DOB: 1971/07/15, 46 y.o.   MRN: [id number]  HPI HLD: Not on meds, here for follow up. HTN:Not on meds, here for follow up. Other than her left arm burning pain, no chest pain or SOB. DM: Here for DM screening: No family hx.She is concern given her neuropathic pain. She is worried about DM neuropathy. Denies urinary symptoms, no polyuria. Left arm pain:Patient stated her left sided neck and shoulder pain persists and it is now radiation to her left flank and lumbar spine area. She denies any urinary symptoms. She is in the process of applying for disability and would like me to help her complete her disability form.  Current Outpatient Prescriptions on File Prior to Visit  Medication Sig Dispense Refill  . Biotin 1000 MCG tablet Take 1,000 mcg by mouth daily.    . diclofenac (VOLTAREN) 75 MG EC tablet Take 1 tablet (75 mg total) by mouth 2 (two) times daily. 30 tablet 2  . gabapentin (NEURONTIN) 100 MG capsule Take 3 capsules (300 mg total) by mouth 3 (three) times daily. 270 capsule 0  . hydrOXYzine (VISTARIL) 50 MG capsule 1 PO 1 hour prior to procedure 30 capsule 0  . ibuprofen (ADVIL,MOTRIN) 400 MG tablet Take 1 tablet (400 mg total) by mouth every 6 (six) hours as needed. 30 tablet 0  . methocarbamol (ROBAXIN) 500 MG tablet Take 1 tablet (500 mg total) by mouth every 6 (six) hours as needed for muscle spasms. (Patient not taking: Reported on 12/27/2016) 30 tablet 2  . metroNIDAZOLE (FLAGYL) 500 MG tablet Take 1 tablet (500 mg total) by mouth 2 (two) times daily. 14 tablet 0  . oxyCODONE-acetaminophen (PERCOCET/ROXICET) 5-325 MG tablet Take 1 tablet by mouth every 8 (eight) hours as needed for severe pain. 15 tablet 0  . sertraline (ZOLOFT) 50 MG tablet TAKE ONE TABLET BY MOUTH ONCE DAILY 30 tablet 3  . traMADol (ULTRAM) 50 MG tablet Take 1 tablet (50 mg total) by mouth every 8 (eight) hours as needed. 20 tablet 0   No current  facility-administered medications on file prior to visit.    Past Medical History:  Diagnosis Date  . Depression 01/11/2012  . Left ear hearing loss      Review of Systems  Respiratory: Negative.   Cardiovascular: Negative.   Gastrointestinal: Negative.   Genitourinary: Negative.   Musculoskeletal: Positive for back pain.       Left neck and arm pain  Neurological: Negative for tremors, speech difficulty and light-headedness.  All other systems reviewed and are negative.  Vitals:   12/30/16 1127 12/30/16 1158 12/30/16 1211 12/30/16 1213  BP: (!) 190/100 (!) 164/92 (!) 179/110 (!) 165/100  Pulse:      Temp:      TempSrc:      SpO2:      Weight:      Height:           Objective:   Physical Exam  Constitutional: She is oriented to person, place, and time. She appears well-developed. No distress.  Cardiovascular: Normal rate, regular rhythm and normal heart sounds.   No murmur heard. Pulmonary/Chest: Effort normal and breath sounds normal. No respiratory distress. She has no wheezes.  Musculoskeletal: Normal range of motion. She exhibits no edema.       Left shoulder: She exhibits tenderness. She exhibits no swelling, no effusion and no crepitus.       Left elbow: She exhibits  normal range of motion, no swelling and no deformity. Tenderness found.       Cervical back: She exhibits tenderness. She exhibits normal range of motion.       Lumbar back: Normal. She exhibits normal range of motion, no tenderness and no swelling.       Arms: Neurological: She is alert and oriented to person, place, and time. She has normal strength. No cranial nerve deficit or sensory deficit. Coordination normal.  Psychiatric: She has a normal mood and affect.  Nursing note and vitals reviewed.      Assessment:     HLD HTN DM screening Left arm pain    Plan:     Check problem list.

## 2016-12-31 ENCOUNTER — Encounter (HOSPITAL_COMMUNITY): Payer: Self-pay

## 2016-12-31 ENCOUNTER — Emergency Department (HOSPITAL_COMMUNITY)
Admission: EM | Admit: 2016-12-31 | Discharge: 2016-12-31 | Disposition: A | Discharge status: Home / Self Care | Payer: Medicaid Other | Attending: Emergency Medicine | Admitting: Emergency Medicine

## 2016-12-31 DIAGNOSIS — M545 Low back pain, unspecified: Secondary | ICD-10-CM

## 2016-12-31 DIAGNOSIS — Y999 Unspecified external cause status: Secondary | ICD-10-CM | POA: Insufficient documentation

## 2016-12-31 DIAGNOSIS — X58XXXA Exposure to other specified factors, initial encounter: Secondary | ICD-10-CM | POA: Insufficient documentation

## 2016-12-31 DIAGNOSIS — S39012A Strain of muscle, fascia and tendon of lower back, initial encounter: Secondary | ICD-10-CM | POA: Insufficient documentation

## 2016-12-31 DIAGNOSIS — Z91040 Latex allergy status: Secondary | ICD-10-CM | POA: Diagnosis not present

## 2016-12-31 DIAGNOSIS — S3992XA Unspecified injury of lower back, initial encounter: Secondary | ICD-10-CM | POA: Diagnosis present

## 2016-12-31 DIAGNOSIS — Y929 Unspecified place or not applicable: Secondary | ICD-10-CM | POA: Insufficient documentation

## 2016-12-31 DIAGNOSIS — Z79899 Other long term (current) drug therapy: Secondary | ICD-10-CM | POA: Diagnosis not present

## 2016-12-31 DIAGNOSIS — I10 Essential (primary) hypertension: Secondary | ICD-10-CM | POA: Insufficient documentation

## 2016-12-31 DIAGNOSIS — Y939 Activity, unspecified: Secondary | ICD-10-CM | POA: Diagnosis not present

## 2016-12-31 DIAGNOSIS — T148XXA Other injury of unspecified body region, initial encounter: Secondary | ICD-10-CM

## 2016-12-31 LAB — URINALYSIS, ROUTINE W REFLEX MICROSCOPIC
Bilirubin Urine: NEGATIVE
GLUCOSE, UA: NEGATIVE mg/dL
Ketones, ur: NEGATIVE mg/dL
Nitrite: NEGATIVE
PH: 5 (ref 5.0–8.0)
Protein, ur: NEGATIVE mg/dL
SPECIFIC GRAVITY, URINE: 1.02 (ref 1.005–1.030)
WBC, UA: NONE SEEN WBC/hpf (ref 0–5)

## 2016-12-31 LAB — POC URINE PREG, ED: Preg Test, Ur: NEGATIVE

## 2016-12-31 MED ORDER — HYDROCODONE-ACETAMINOPHEN 5-325 MG PO TABS
2.0000 | ORAL_TABLET | Freq: Once | ORAL | Status: AC
  Administered 2016-12-31: 2 via ORAL
  Filled 2016-12-31: qty 2

## 2016-12-31 MED ORDER — CYCLOBENZAPRINE HCL 10 MG PO TABS: 10.0000 mg | ORAL_TABLET | Freq: Two times a day (BID) | ORAL | 0 refills | Status: DC | PRN
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
## 2016-12-31 NOTE — ED Triage Notes (Addendum)
Patient c/o left lower back pain x 3 days. Patient denies any injury, exercising, or heavy lifting. Patient deneis any dysuria.

## 2017-01-02 ENCOUNTER — Telehealth: Payer: Self-pay | Admitting: Family Medicine

## 2017-01-02 ENCOUNTER — Telehealth (INDEPENDENT_AMBULATORY_CARE_PROVIDER_SITE_OTHER): Payer: Self-pay | Admitting: Orthopaedic Surgery

## 2017-01-02 MED ORDER — ATORVASTATIN CALCIUM 20 MG PO TABS: 20.0000 mg | ORAL_TABLET | Freq: Every day | ORAL | 3 refills | Status: DC

## 2017-01-02 NOTE — Telephone Encounter (Signed)
Patient needs a refill of percocet

## 2017-01-02 NOTE — Telephone Encounter (Signed)
Patient needs a refill of percocet. Patient is still experiencing a lot of pain.

## 2017-01-02 NOTE — Procedures (Signed)
Cervical Epidural Steroid Injection - Interlaminar Approach with Fluoroscopic Guidance  Patient: Lisa Newton      Date of Birth: [birth year] MRN: [id number] PCP: Janit PaganENIOLA, KEHINDE, MD      Visit Date: 12/30/2016   Universal Protocol:    Date/Time: 01/22/185:33 AM  Consent Given By: the patient  Position: PRONE  Additional Comments: Vital signs were monitored before and after the procedure. Patient was prepped and draped in the usual sterile fashion. The correct patient, procedure, and site was verified.   Injection Procedure Details:  Procedure Site One Meds Administered:  Meds ordered this encounter  Medications  . lidocaine (PF) (XYLOCAINE) 1 % injection 0.3 mL  . methylPREDNISolone acetate (DEPO-MEDROL) injection 80 mg     Laterality: Left  Location/Site:  C7-T1  Needle size: 20 G  Needle type: Touhy  Needle Placement: Paramedian epidural space  Findings:  -Contrast Used: 1 mL iohexol 180 mg iodine/mL   -Comments: Excellent flow of contrast into the epidural space.  Procedure Details: Using a paramedian approach from the side mentioned above, the region overlying the inferior lamina was localized under fluoroscopic visualization and the soft tissues overlying this structure were infiltrated with 4 ml. of 1% Lidocaine without Epinephrine. A # 20 gauge, Tuohy needle was inserted into the epidural space using a paramedian approach.  The epidural space was localized using loss of resistance along with lateral and contralateral oblique bi-planar fluoroscopic views.  After negative aspirate for air, blood, and CSF, a 2 ml. volume of Isovue-250 was injected into the epidural space and the flow of contrast was observed. Radiographs were obtained for documentation purposes.   The injectate was administered into the level noted above.  Additional Comments:  The patient tolerated the procedure well Dressing: Band-Aid and 2x2 sterile gauze     Post-procedure  details: Patient was observed during the procedure. Post-procedure instructions were reviewed.  Patient left the clinic in stable condition.

## 2017-01-02 NOTE — Telephone Encounter (Signed)
Result discussed with patient. Serum glucose normal DM unlikely.  FLP still abnormal and given high BP with 7.8% ASCVD risk, moderate to high intensity statin recommended. Patient agreed with plan. She also started her BP meds and is scheduled to see RN tomorrow for BP check..   She again mentioned left arm pain and need for opioid. I again reiterate to her that based on clinic protocol with hx of positive cocaine and THC and refusal of repeat toxicology screening we will not be able to manage her pain with opioid. I recommended Gabapentin which she stated she is taking.  I reminded her that referral to pain clinic as been placed and I will get in touch with our referral specialist to expedite it.  I also advised follow up with orthopedic, they have been prescribing her opioid.  She is scheduled to see a spine specialist to provide a definitive treatment as well.  I completed her disability form for her lawyer as requested and I will fax it at her request to [facility]. She stated she will pick up a copy tomorrow and we can fax a copy to her lawyer.    Tia: Please help with pain clinic referral   Tamika: She comes to you tomorrow for BP check.

## 2017-01-03 ENCOUNTER — Other Ambulatory Visit (INDEPENDENT_AMBULATORY_CARE_PROVIDER_SITE_OTHER): Payer: Self-pay | Admitting: Specialist

## 2017-01-03 ENCOUNTER — Ambulatory Visit (INDEPENDENT_AMBULATORY_CARE_PROVIDER_SITE_OTHER): Payer: Medicaid Other | Admitting: Neurology

## 2017-01-03 ENCOUNTER — Telehealth: Payer: Self-pay | Admitting: Family Medicine

## 2017-01-03 ENCOUNTER — Ambulatory Visit: Payer: Medicaid Other | Admitting: *Deleted

## 2017-01-03 VITALS — BP 138/88 | HR 90

## 2017-01-03 DIAGNOSIS — M5412 Radiculopathy, cervical region: Secondary | ICD-10-CM | POA: Diagnosis not present

## 2017-01-03 DIAGNOSIS — H70202 Unspecified petrositis, left ear: Secondary | ICD-10-CM

## 2017-01-03 DIAGNOSIS — Z0130 Encounter for examination of blood pressure without abnormal findings: Secondary | ICD-10-CM

## 2017-01-03 MED ORDER — OXYCODONE-ACETAMINOPHEN 5-325 MG PO TABS: 1.0000 | ORAL_TABLET | Freq: Four times a day (QID) | ORAL | 0 refills | Status: DC | PRN

## 2017-01-03 NOTE — Telephone Encounter (Signed)
Has a left C6-7 HNP below 2 levels of degenerative disc disease, EMGs are not impressive but the MRI is showing left cord compression that may be proximal to that picked up on EMGs. I'll renew the pain med and see if we can get her in tomorrow or Thursday. jen

## 2017-01-03 NOTE — Telephone Encounter (Signed)
Put patient in @ 845 on 01/04/2017 

## 2017-01-03 NOTE — Telephone Encounter (Addendum)
Update.  I completed disability form on her behalf for the diagnosis of cervical radiculopathy based on MRI cervical spine report and orthopedic assessment documentation. Form was placed at the front office at 7am today to be faxed.   Later in the afternoon, I got her EMG/NCS test result from the neurologist. It was negative for cervical radiculopathy or carpal tunnel syndrome. Unfortunately the disability form would have already been fax.   At this point I am uncertain the actual cause of her neuropathic pain. It seems she was referred to the spine specialist/Surg for further evaluation of her neck and arm pain. I will defer further management to them.  In the mean time continue Gabapentin for pain.

## 2017-01-03 NOTE — Progress Notes (Signed)
Put patient in @ 845 on 01/04/2017

## 2017-01-03 NOTE — Telephone Encounter (Signed)
Pt called back about medication

## 2017-01-03 NOTE — Telephone Encounter (Signed)
Pt picked up [facility] that [facility].  Dr Lum BabeEniola called pt yesterday and told her she had faxed the forms.  They were never received,  [facility] [phone]. Please let pt know when this has been done.

## 2017-01-03 NOTE — Telephone Encounter (Signed)
Thank you  for update

## 2017-01-03 NOTE — Telephone Encounter (Signed)
It was placed up front to be faxed this morning at 7am.

## 2017-01-03 NOTE — Telephone Encounter (Signed)
Will check with MD.  Original may have been given to patient since it was not in fax folder.  Rakesh Dutko,CMA

## 2017-01-03 NOTE — Telephone Encounter (Signed)
Spoke with patient and informed her that the forms hadn't been fax yet when she contacted the lawyer and they would be faxed by the end of the day today.  Patient voiced understanding. Audree Schrecengost,CMA

## 2017-01-03 NOTE — Progress Notes (Signed)
   Patient in nurse clinic for blood pressure check.  Patient denies chest pain, SOB, headache, dizziness, visual changes. Patient however is complaining of pain 10/10 back pain.  Patient is history of chronic back pain.  Will forward chart to PCP.  Clovis PuMartin, Mahima Hottle L, RN   Today's Vitals   01/03/17 0940 01/03/17 0941  BP: (!) 150/90 138/88  Pulse: 87 90  SpO2: 98% 98%  PainSc: 10-Worst pain ever 10-Worst pain ever  PainLoc: Back

## 2017-01-03 NOTE — Procedures (Signed)
[facility]  [address], [address]  [place], [place] [address] Tel: [phone] Fax:  [phone] Test Date:  01/03/2017  Patient: Lisa Newton DOB: November 12, 1971 Physician: Nita Sickleonika Kenniya Westrich, DO  Sex: Female Height: 5\' 6"  Ref Phys: Janit PaganKehinde Eniola, M.D.  ID#: [id number] Temp: 33.3C Technician: Judie PetitM. Dean   Patient Complaints: This is a 46 year old female referred for evaluation of neck pain radiating into her left shoulder and arm.   NCV & EMG Findings: Extensive electrodiagnostic testing of the left upper extremity shows:  1. Left median, ulnar, and mixed palmar sensory responses are within normal limits. 2. Left median and ulnar motor responses are within normal limits. 3. There is no evidence of active or chronic motor axon loss changes affecting any of the tested muscles. Motor unit configuration and recruitment pattern is within normal limits.  Impression: This is a normal study of the left upper extremity.   In particular, there is no evidence of a cervical radiculopathy or carpal tunnel syndrome.   ___________________________ Nita Sickleonika Kaylamarie Swickard, DO    Nerve Conduction Studies Anti Sensory Summary Table   Site NR Peak (ms) Norm Peak (ms) P-T Amp (V) Norm P-T Amp  Left Median Anti Sensory (2nd Digit)  Wrist    2.9 <3.4 33.7 >20  Left Ulnar Anti Sensory (5th Digit)  Wrist    2.8 <3.1 27.1 >12   Motor Summary Table   Site NR Onset (ms) Norm Onset (ms) O-P Amp (mV) Norm O-P Amp Site1 Site2 Delta-0 (ms) Dist (cm) Vel (m/s) Norm Vel (m/s)  Left Median Motor (Abd Poll Brev)  Wrist    3.3 <3.9 7.8 >6 Elbow Wrist 4.1 24.0 59 >50  Elbow    7.4  7.0         Left Ulnar Motor (Abd Dig Minimi)  Wrist    2.5 <3.1 9.5 >7 B Elbow Wrist 3.5 20.0 57 >50  B Elbow    6.0  9.2  A Elbow B Elbow 1.5 10.0 67 >50  A Elbow    7.5  9.2          Comparison Summary Table   Site NR Peak (ms) Norm Peak (ms) P-T Amp (V) Site1 Site2 Delta-P (ms) Norm Delta (ms)  Left Median/Ulnar Palm  Comparison (Wrist - 8cm)  Median Palm    1.8 <2.2 36.6 Median Palm Ulnar Palm 0.1   Ulnar Palm    1.9 <2.2 12.7       EMG   Side Muscle Ins Act Fibs Psw Fasc Number Recrt Dur Dur. Amp Amp. Poly Poly. Comment  Left 1stDorInt Nml Nml Nml Nml Nml Nml Nml Nml Nml Nml Nml Nml N/A  Left Ext Indicis Nml Nml Nml Nml Nml Nml Nml Nml Nml Nml Nml Nml N/A  Left PronatorTeres Nml Nml Nml Nml Nml Nml Nml Nml Nml Nml Nml Nml N/A  Left Biceps Nml Nml Nml Nml Nml Nml Nml Nml Nml Nml Nml Nml N/A  Left Triceps Nml Nml Nml Nml Nml Nml Nml Nml Nml Nml Nml Nml N/A  Left Deltoid Nml Nml Nml Nml Nml Nml Nml Nml Nml Nml Nml Nml N/A      Waveforms:

## 2017-01-04 ENCOUNTER — Encounter (INDEPENDENT_AMBULATORY_CARE_PROVIDER_SITE_OTHER): Payer: Self-pay | Admitting: Specialist

## 2017-01-04 ENCOUNTER — Ambulatory Visit (INDEPENDENT_AMBULATORY_CARE_PROVIDER_SITE_OTHER): Payer: Medicaid Other | Admitting: Specialist

## 2017-01-04 VITALS — BP 133/90 | Ht 66.0 in | Wt 186.0 lb

## 2017-01-04 DIAGNOSIS — M5020 Other cervical disc displacement, unspecified cervical region: Secondary | ICD-10-CM | POA: Diagnosis not present

## 2017-01-04 DIAGNOSIS — M5412 Radiculopathy, cervical region: Secondary | ICD-10-CM | POA: Diagnosis not present

## 2017-01-04 DIAGNOSIS — M5030 Other cervical disc degeneration, unspecified cervical region: Secondary | ICD-10-CM | POA: Diagnosis not present
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
## 2017-01-04 NOTE — Addendum Note (Signed)
Addended by: Vira BrownsNITKA, JAMES on: 01/04/2017 10:34 AM   Modules accepted: Orders
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
## 2017-01-09 ENCOUNTER — Other Ambulatory Visit: Payer: Self-pay | Admitting: Family Medicine

## 2017-01-09 ENCOUNTER — Telehealth: Payer: Self-pay | Admitting: Family Medicine

## 2017-01-09 MED ORDER — CYCLOBENZAPRINE HCL 10 MG PO TABS: 10.0000 mg | ORAL_TABLET | Freq: Two times a day (BID) | ORAL | 0 refills | Status: DC | PRN

## 2017-01-09 NOTE — Telephone Encounter (Signed)
Patient voiced understanding on instructions given.  Rojean Ige,CMA

## 2017-01-09 NOTE — Telephone Encounter (Signed)
Please advise patient. Flexeril refilled. Do not use Robaxin  Advise her not to use while driving or operating any machinery. Advise her this is not a prolong use medication. Please document conversation with her.  Thanks.

## 2017-01-09 NOTE — Telephone Encounter (Signed)
Pt called because she was given Flexeril 10 mg at the hospital and this worked very well for her. She would like the doctor to call in a prescription to her Pharmacy for this. jw

## 2017-01-09 NOTE — Telephone Encounter (Signed)
Will forward to MD to advise. Morning Halberg,CMA  

## 2017-01-12 ENCOUNTER — Ambulatory Visit: Payer: Medicaid Other | Admitting: Physical Therapy

## 2017-01-13 ENCOUNTER — Ambulatory Visit (INDEPENDENT_AMBULATORY_CARE_PROVIDER_SITE_OTHER): Payer: Medicaid Other | Admitting: Family Medicine

## 2017-01-13 ENCOUNTER — Encounter: Payer: Self-pay | Admitting: Family Medicine

## 2017-01-13 VITALS — BP 136/80 | HR 90 | Temp 98.1°F | Wt 192.0 lb

## 2017-01-13 DIAGNOSIS — E785 Hyperlipidemia, unspecified: Secondary | ICD-10-CM | POA: Diagnosis not present

## 2017-01-13 DIAGNOSIS — I10 Essential (primary) hypertension: Secondary | ICD-10-CM | POA: Diagnosis not present

## 2017-01-13 DIAGNOSIS — R7309 Other abnormal glucose: Secondary | ICD-10-CM | POA: Diagnosis not present

## 2017-01-13 DIAGNOSIS — M5412 Radiculopathy, cervical region: Secondary | ICD-10-CM

## 2017-01-13 LAB — POCT GLYCOSYLATED HEMOGLOBIN (HGB A1C): Hemoglobin A1C: 5.4

## 2017-01-13 MED ORDER — GABAPENTIN 300 MG PO CAPS: 300.0000 mg | ORAL_CAPSULE | Freq: Three times a day (TID) | ORAL | 1 refills | Status: DC

## 2017-01-13 NOTE — Patient Instructions (Signed)
It was nice to see you today. I am glad your blood pressure looks better. Continue current dose of HCTZ. Please start your cholesterol medication. I will complete your disability form and call you as soon as it is ready for pick up. Call me if you have any question.

## 2017-01-13 NOTE — Assessment & Plan Note (Signed)
Seems to be doing much better. She has PT and orthopedic f/u appointment scheduled. Currently pain management per Dr. Otelia SergeantNitka. Continue Flexeril and Gabapentin. I refilled her gabapentin today. She came with disability form from the [employer]. Form completed for her today. She was called to return to pick up her form today.

## 2017-01-13 NOTE — Progress Notes (Signed)
Subjective:     Patient ID: Lisa Newton, female   DOB: [birth year], 46 y.o.   MRN: [id number]  HPI NWG:NFAO for follow up. She is compliant with her HCTZ. No concern. HLD: She is yet to pick up her Statin. Left arm pain: She stated her pain is a bid better. Still taking Percocet, Flexeril and Gabapentin. Denies any new concern. She has follow up with Dr. Otelia Sergeant sometime next week.   Current Outpatient Prescriptions on File Prior to Visit  Medication Sig Dispense Refill  . atorvastatin (LIPITOR) 20 MG tablet Take 1 tablet (20 mg total) by mouth daily. 90 tablet 3  . Biotin 1000 MCG tablet Take 1,000 mcg by mouth daily.    . cyclobenzaprine (FLEXERIL) 10 MG tablet Take 1 tablet (10 mg total) by mouth 2 (two) times daily as needed for muscle spasms. 30 tablet 0  . diclofenac (VOLTAREN) 75 MG EC tablet Take 1 tablet (75 mg total) by mouth 2 (two) times daily. 30 tablet 2  . gabapentin (NEURONTIN) 100 MG capsule Take 3 capsules (300 mg total) by mouth 3 (three) times daily. 270 capsule 0  . hydrochlorothiazide (MICROZIDE) 12.5 MG capsule Take 1 capsule (12.5 mg total) by mouth daily. Hold if BP is consistently less than 130/80 30 capsule 1  . hydrOXYzine (VISTARIL) 50 MG capsule 1 PO 1 hour prior to procedure 30 capsule 0  . ibuprofen (ADVIL,MOTRIN) 400 MG tablet Take 1 tablet (400 mg total) by mouth every 6 (six) hours as needed. 30 tablet 0  . oxyCODONE-acetaminophen (PERCOCET/ROXICET) 5-325 MG tablet Take 1-2 tablets by mouth every 6 (six) hours as needed for severe pain. 40 tablet 0  . sertraline (ZOLOFT) 50 MG tablet TAKE ONE TABLET BY MOUTH ONCE DAILY 30 tablet 3  . traMADol (ULTRAM) 50 MG tablet Take 1 tablet (50 mg total) by mouth every 8 (eight) hours as needed. 20 tablet 0   Current Facility-Administered Medications on File Prior to Visit  Medication Dose Route Frequency Provider Last Rate Last Dose  . lidocaine (PF) (XYLOCAINE) 1 % injection 0.3 mL  0.3 mL Other Once Tyrell Antonio, MD        Past Medical History:  Diagnosis Date  . Depression 01/11/2012  . Left ear hearing loss    Vitals:   01/13/17 0956  BP: 136/80  Pulse: 90  Temp: 98.1 F (36.7 C)  TempSrc: Oral  SpO2: 99%  Weight: 192 lb (87.1 kg)     Review of Systems  Respiratory: Negative.   Cardiovascular: Negative.   Gastrointestinal: Negative.   Genitourinary: Negative.   Musculoskeletal: Negative for joint swelling.       Left arm pain.  All other systems reviewed and are negative.      Objective:   Physical Exam  Constitutional: She appears well-developed. No distress.  Cardiovascular: Normal rate, regular rhythm and normal heart sounds.   No murmur heard. Pulmonary/Chest: Effort normal and breath sounds normal. No respiratory distress. She has no wheezes.  Abdominal: Soft. Bowel sounds are normal. She exhibits no distension. There is no tenderness.  Musculoskeletal:       Left shoulder: She exhibits decreased range of motion and tenderness. She exhibits no bony tenderness, no swelling and no crepitus.  Neurological: She is alert. She has normal strength and normal reflexes. No cranial nerve deficit or sensory deficit. She displays a negative Romberg sign.  Nursing note and vitals reviewed.      Assessment:     HTN HLD Left arm  pain: cervical radiculopathy    Plan:     Check problem list.

## 2017-01-13 NOTE — Assessment & Plan Note (Addendum)
BP looks better. Continue HCTZ 12.5 mg qd. Monitor BP at home.

## 2017-01-13 NOTE — Assessment & Plan Note (Signed)
She is encouraged to pick up her Lipitor. Call if having any concern with her meds. She verbalized understanding.

## 2017-01-16 ENCOUNTER — Ambulatory Visit: Payer: Medicaid Other | Attending: Specialist | Admitting: Physical Therapy

## 2017-01-16 ENCOUNTER — Telehealth (INDEPENDENT_AMBULATORY_CARE_PROVIDER_SITE_OTHER): Payer: Self-pay | Admitting: Specialist

## 2017-01-16 ENCOUNTER — Encounter: Payer: Self-pay | Admitting: Physical Therapy

## 2017-01-16 DIAGNOSIS — M5412 Radiculopathy, cervical region: Secondary | ICD-10-CM

## 2017-01-16 DIAGNOSIS — M542 Cervicalgia: Secondary | ICD-10-CM | POA: Insufficient documentation

## 2017-01-16 DIAGNOSIS — R208 Other disturbances of skin sensation: Secondary | ICD-10-CM | POA: Insufficient documentation
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
## 2017-01-16 NOTE — Telephone Encounter (Signed)
Patient called to let Dr Otelia SergeantNitka know she went to her therapy this morning. Patient also advised needing Rx refilled (hydroxyzine and oxycodone) The number to contact patient is [phone]

## 2017-01-16 NOTE — Patient Instructions (Signed)
Axial Extension (Chin Tuck)    Pull chin in and lengthen back of neck. Hold __5-10__ seconds while counting out loud. Repeat __10__ times. Do __2-3__ sessions per day.  http://gt2.exer.us/450   Copyright  VHI. All rights reserved.       AROM: Neck Rotation    Turn head slowly to look over one shoulder, then the other. Hold each position __20-30__ seconds. Repeat __2-3__ times per set. Do _1___ sets per session. Do ___2_ sessions per day.  http://orth.exer.us/295   Copyright  VHI. All rights reserved.   Levator Stretch   Grasp seat or sit on hand on side to be stretched. Turn head toward other side and look down. Use hand on head to gently stretch neck in that position. Hold __20-30__ seconds. Repeat on other side. Repeat __2-3__ times. Do __2__ sessions per day.  http://gt2.exer.us/30   Copyright  VHI. All rights reserved.  Side-Bending   One hand on opposite side of head, pull head to side as far as is comfortable. Stop if there is pain. Hold ___20-30_ seconds. Repeat with other hand to other side. Repeat __2-3_ times. Do _2___ sessions per day.   Copyright  VHI. All rights reserved.  Scapular Retraction (Standing)   With arms at sides, pinch shoulder blades together. Repeat __10__ times per set. Do __2__ sets per session. Do __2__ sessions per day.  http://orth.exer.us/944     Sleeping on Back  Place pillow under knees. A pillow with cervical support and a roll around waist are also helpful. Copyright  VHI. All rights reserved.  Sleeping on Side Place pillow between knees. Use cervical support under neck and a roll around waist as needed. Copyright  VHI. All rights reserved.   Sleeping on Stomach   If this is the only desirable sleeping position, place pillow under lower legs, and under stomach or chest as needed.  Posture - Sitting   Sit upright, head facing forward. Try using a roll to support lower back. Keep shoulders relaxed, and avoid  rounded back. Keep hips level with knees. Avoid crossing legs for long periods. Stand to Sit / Sit to Stand   To sit: Bend knees to lower self onto front edge of chair, then scoot back on seat. To stand: Reverse sequence by placing one foot forward, and scoot to front of seat. Use rocking motion to stand up.   Work Height and Reach  Ideal work height is no more than 2 to 4 inches below elbow level when standing, and at elbow level when sitting. Reaching should be limited to arm's length, with elbows slightly bent.  Bending  Bend at hips and knees, not back. Keep feet shoulder-width apart.    Posture - Standing   Good posture is important. Avoid slouching and forward head thrust. Maintain curve in low back and align ears over shoul- ders, hips over ankles.  Alternating Positions   Alternate tasks and change positions frequently to reduce fatigue and muscle tension. Take rest breaks. Computer Work   Position work to Art gallery manager. Use proper work and seat height. Keep shoulders back and down, wrists straight, and elbows at right angles. Use chair that provides full back support. Add footrest and lumbar roll as needed.  Getting Into / Out of Car  Lower self onto seat, scoot back, then bring in one leg at a time. Reverse sequence to get out.  Dressing  Lie on back to pull socks or slacks over feet, or sit and bend leg while keeping back  straight.    Housework - Sink  Place one foot on ledge of cabinet under sink when standing at sink for prolonged periods.   Pushing / Pulling  Pushing is preferable to pulling. Keep back in proper alignment, and use leg muscles to do the work.  Deep Squat   Squat and lift with both arms held against upper trunk. Tighten stomach muscles without holding breath. Use smooth movements to avoid jerking.  Avoid Twisting   Avoid twisting or bending back. Pivot around using foot movements, and bend at knees if needed when reaching for  articles.  Carrying Luggage   Distribute weight evenly on both sides. Use a cart whenever possible. Do not twist trunk. Move body as a unit.   Lifting Principles .Maintain proper posture and head alignment. .Slide object as close as possible before lifting. .Move obstacles out of the way. .Test before lifting; ask for help if too heavy. .Tighten stomach muscles without holding breath. .Use smooth movements; do not jerk. .Use legs to do the work, and pivot with feet. .Distribute the work load symmetrically and close to the center of trunk. .Push instead of pull whenever possible.   Ask For Help   Ask for help and delegate to others when possible. Coordinate your movements when lifting together, and maintain the low back curve.  Log Roll   Lying on back, bend left knee and place left arm across chest. Roll all in one movement to the right. Reverse to roll to the left. Always move as one unit. Housework - Sweeping  Use long-handled equipment to avoid stooping.   Housework - Wiping  Position yourself as close as possible to reach work surface. Avoid straining your back.  Laundry - Unloading Wash   To unload small items at bottom of washer, lift leg opposite to arm being used to reach.  Gardening - Raking  Move close to area to be raked. Use arm movements to do the work. Keep back straight and avoid twisting.     Cart  When reaching into cart with one arm, lift opposite leg to keep back straight.   Getting Into / Out of Bed  Lower self to lie down on one side by raising legs and lowering head at the same time. Use arms to assist moving without twisting. Bend both knees to roll onto back if desired. To sit up, start from lying on side, and use same move-ments in reverse. Housework - Vacuuming  Hold the vacuum with arm held at side. Step back and forth to move it, keeping head up. Avoid twisting.   Laundry - Armed forces training and education officer so that bending  and twisting can be avoided.   Laundry - Unloading Dryer  Squat down to reach into clothes dryer or use a reacher.  Gardening - Weeding / Psychiatric nurse or Kneel. Knee pads may be helpful.                      TENS stands for Transcutaneous Electrical Nerve Stimulation. In other words, electrical impulses are allowed to pass through the skin in order to excite a nerve.   Purpose and Use of TENS:  TENS is a method used to manage acute and chronic pain without the use of drugs. It has been effective in managing pain associated with surgery, sprains, strains, trauma, rheumatoid arthritis, and neuralgias. It is a non-addictive, low risk, and non-invasive technique used to control pain. It is not, by any  means, a curative form of treatment.   How TENS Works:  Most TENS units are a Statisticiansmall pocket-sized unit powered by one 9 volt battery. Attached to the outside of the unit are two lead wires where two pins and/or snaps connect on each wire. All units come with a set of four reusable pads or electrodes. These are placed on the skin surrounding the area involved. By inserting the leads into  the pads, the electricity can pass from the unit making the circuit complete.  As the intensity is turned up slowly, the electrical current enters the body from the electrodes through the skin to the surrounding nerve fibers. This triggers the release of hormones from within the body. These hormones contain pain relievers. By increasing the circulation of these hormones, the person's pain may be lessened. It is also believed that the electrical stimulation itself helps to block the pain messages being sent to the brain, thus also decreasing the body's perception of pain.   Hazards:  TENS units are NOT to be used by patients with PACEMAKERS, DEFIBRILLATORS, DIABETIC PUMPS, PREGNANT WOMEN, and patients with SEIZURE DISORDERS.  TENS units are NOT to be used over the heart, throat, brain, or spinal  cord.  One of the major side effects from the TENS unit may be skin irritation. Some people may develop a rash if they are sensitive to the materials used in the electrodes or the connecting wires.   Wear the unit for 30-40 min at a time.    Avoid overuse due the body getting used to the stem making it not as effective over time.     Tennis ball for self care, self release of tight mm in shoulder, lean into the wall with ball behind shoulder blade,  Don't press on the bone.

## 2017-01-16 NOTE — Telephone Encounter (Signed)
Patient called to let Dr Otelia SergeantNitka know she went to her therapy this morning. Patient also advised needing Rx refilled (hydroxyzine and oxycodone)

## 2017-01-17 ENCOUNTER — Other Ambulatory Visit (INDEPENDENT_AMBULATORY_CARE_PROVIDER_SITE_OTHER): Payer: Self-pay | Admitting: Specialist

## 2017-01-17 DIAGNOSIS — H70202 Unspecified petrositis, left ear: Secondary | ICD-10-CM

## 2017-01-17 MED ORDER — OXYCODONE-ACETAMINOPHEN 5-325 MG PO TABS: 1.0000 | ORAL_TABLET | Freq: Four times a day (QID) | ORAL | 0 refills | Status: DC | PRN

## 2017-01-17 MED ORDER — HYDROXYZINE PAMOATE 50 MG PO CAPS: ORAL_CAPSULE | ORAL | 0 refills | Status: DC

## 2017-01-17 NOTE — Progress Notes (Signed)
Patient picked up rx.

## 2017-01-17 NOTE — Telephone Encounter (Signed)
Rx for oxycodone printed and signed for her to pickup. Rx for hydroxyzine sent to her pharmacy.jen

## 2017-01-27 ENCOUNTER — Ambulatory Visit (INDEPENDENT_AMBULATORY_CARE_PROVIDER_SITE_OTHER): Payer: Medicaid Other | Admitting: Specialist

## 2017-01-27 ENCOUNTER — Encounter (INDEPENDENT_AMBULATORY_CARE_PROVIDER_SITE_OTHER): Payer: Self-pay | Admitting: Specialist

## 2017-01-27 VITALS — BP 122/84 | HR 79 | Ht 66.0 in | Wt 186.0 lb

## 2017-01-27 DIAGNOSIS — H70202 Unspecified petrositis, left ear: Secondary | ICD-10-CM

## 2017-01-27 DIAGNOSIS — M5020 Other cervical disc displacement, unspecified cervical region: Secondary | ICD-10-CM

## 2017-01-27 DIAGNOSIS — M5412 Radiculopathy, cervical region: Secondary | ICD-10-CM

## 2017-01-27 DIAGNOSIS — M542 Cervicalgia: Secondary | ICD-10-CM

## 2017-01-27 MED ORDER — OXYCODONE-ACETAMINOPHEN 5-325 MG PO TABS: 1.0000 | ORAL_TABLET | Freq: Four times a day (QID) | ORAL | 0 refills | Status: DC | PRN
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
## 2017-01-27 NOTE — Patient Instructions (Addendum)
Avoid overhead lifting and overhead use of the arms. Do not lift greater than 5 lbs. Adjust head rest in vehicle to prevent hyperextension if rear ended.  Surgery is indicated due to upper extremity radiculopathy. In the future surgery at adjacent levels may be necessary but these levels do not appear to be related to your current symptoms or signs.  Scheduling secretary Tivis RingerSherri Billings will call you to arrange for surgery for your cervical spine. If you wish a second opinion please let us know and we can arrange for you. If you have worsening arm or leg numbness or weakness please call or go to an ER. Surgery will be a left foraminotomy and decompression at the C6-7 level. Risks of surgery include risks of infection, bleeding and risks to the spinal cord and   Surgery is indicated due to upper extremity radiculopathy. In the future surgery at adjacent levels may be necessary but these levels do not appear to be related to your current symptoms or signs.

## 2017-01-27 NOTE — Addendum Note (Signed)
Addended by: Vira BrownsNITKA, JAMES on: 01/27/2017 12:00 PM   Modules accepted: Orders

## 2017-02-01 ENCOUNTER — Ambulatory Visit (INDEPENDENT_AMBULATORY_CARE_PROVIDER_SITE_OTHER): Payer: Medicaid Other | Admitting: Specialist

## 2017-02-01 ENCOUNTER — Telehealth (INDEPENDENT_AMBULATORY_CARE_PROVIDER_SITE_OTHER): Payer: Self-pay | Admitting: *Deleted

## 2017-02-01 NOTE — Telephone Encounter (Signed)
Pt calling stating she was in a lot of pain from left hip. Pt stated this is a sharp pain and is this related to her back?  

## 2017-02-01 NOTE — Telephone Encounter (Signed)
Pt calling stating she was in a lot of pain from left hip. Pt stated this is a sharp pain and is this related to her back?

## 2017-02-02 ENCOUNTER — Other Ambulatory Visit (INDEPENDENT_AMBULATORY_CARE_PROVIDER_SITE_OTHER): Payer: Self-pay | Admitting: Specialist

## 2017-02-06 ENCOUNTER — Telehealth: Payer: Self-pay | Admitting: Family Medicine

## 2017-02-06 MED ORDER — CYCLOBENZAPRINE HCL 5 MG PO TABS: 5.0000 mg | ORAL_TABLET | Freq: Two times a day (BID) | ORAL | 1 refills | Status: DC | PRN

## 2017-02-06 NOTE — Telephone Encounter (Signed)
Pt called back about her previous message but also stating that she is having pain on the left side her neck also.

## 2017-02-06 NOTE — Telephone Encounter (Signed)
I got surgical clearance request from her surgeon. Patient has an appointment with me tomorrow. I called to go over things with her. She stated she is yet to agree on surgery. I am not sure how far out she can get surgical clearance done when surgery is yet to be decided upon or scheduled. We might consider deferring clearance while she get in touch with her Surgeon to clarify scheduling.  She requested refill of her Flexeril which I gave. She will continue to receive her Opioid refill from Dr. Barbaraann FasterNitka's office.

## 2017-02-07 ENCOUNTER — Telehealth (INDEPENDENT_AMBULATORY_CARE_PROVIDER_SITE_OTHER): Payer: Self-pay | Admitting: Specialist

## 2017-02-07 ENCOUNTER — Ambulatory Visit: Payer: Medicaid Other | Admitting: Family Medicine

## 2017-02-07 NOTE — Telephone Encounter (Signed)
Patient called needing Rx refilled (percocet) The number to contact her is [phone]. Patient also need to know when she will be scheduled for surgery.

## 2017-02-08 ENCOUNTER — Telehealth (INDEPENDENT_AMBULATORY_CARE_PROVIDER_SITE_OTHER): Payer: Self-pay

## 2017-02-08 NOTE — Telephone Encounter (Signed)
Patient called needing Rx refilled (percocet) The number to contact her is [phone]. Patient also need to know when she will be scheduled for surgery. °

## 2017-02-08 NOTE — Telephone Encounter (Signed)
I returned patient's call about surgery.  She had left me a voice mail.  I advised patient we were working on getting clearance from her PCP as well as getting Medicaid to authorize surgery.  Patient stated she wants to hold off on surgery for now.  She will contact me back if she changes her mind

## 2017-02-08 NOTE — Telephone Encounter (Signed)
I returned patient's call about surgery.  She had left me a voice mail.  I advised patient we were working on getting clearance from her PCP as well as getting Medicaid to authorize surgery.  Patient stated she wants to hold off on surgery for now.  She will contact me back if she changes her mind 

## 2017-02-09 ENCOUNTER — Ambulatory Visit (INDEPENDENT_AMBULATORY_CARE_PROVIDER_SITE_OTHER): Payer: Medicaid Other | Admitting: Specialist

## 2017-02-09 ENCOUNTER — Other Ambulatory Visit (INDEPENDENT_AMBULATORY_CARE_PROVIDER_SITE_OTHER): Payer: Self-pay | Admitting: Specialist

## 2017-02-09 DIAGNOSIS — M5412 Radiculopathy, cervical region: Secondary | ICD-10-CM

## 2017-02-09 DIAGNOSIS — H70202 Unspecified petrositis, left ear: Secondary | ICD-10-CM

## 2017-02-09 MED ORDER — OXYCODONE-ACETAMINOPHEN 5-325 MG PO TABS: 1.0000 | ORAL_TABLET | Freq: Four times a day (QID) | ORAL | 0 refills | Status: DC | PRN

## 2017-02-09 NOTE — Telephone Encounter (Signed)
Rx for percocet renewed, patient wants to know the status of her surgical scheduling, is there a blue sheet in on her? Foraminotomy and decompression C6-7?

## 2017-02-09 NOTE — Telephone Encounter (Signed)
I will cancel Rx for percocet. I will not prescribe narcotics for a condition that is treatable surgically and the patient does not desire intervention. She does not need a surgeon at this point as she does not want surgery. She can be seen back in our office when she is ready for surgical treatment.

## 2017-02-09 NOTE — Progress Notes (Signed)
I spoke with Dr. Otelia SergeantNitka about my conversation with patient yesterday, noted in chart.

## 2017-02-10 NOTE — Telephone Encounter (Signed)
When she is ready to consider surgery she should contact us, I don't think its appropriate to prescribe narcotics open ended for her condition, she can see her primary care doctor for medical treatments. I think surgery is appropriate and the narcotics only make her think that she feels better.

## 2017-02-10 NOTE — Telephone Encounter (Signed)
I called and spoke with patient, she states that she is going to have the surgery but in the meantime she is trying to get something's together so that her and her daughter do not become homeless and she is trying to figure out some kind of income while she was out with surgery.  She states that she no one to help her with the income at this point.

## 2017-02-10 NOTE — Progress Notes (Signed)
See other message

## 2017-02-14 ENCOUNTER — Ambulatory Visit: Payer: Medicaid Other | Admitting: Family Medicine

## 2017-02-14 NOTE — Telephone Encounter (Signed)
I called and lmom advising patient of the message below.

## 2017-02-17 ENCOUNTER — Ambulatory Visit: Payer: Medicaid Other | Admitting: Family Medicine

## 2017-02-21 ENCOUNTER — Other Ambulatory Visit (INDEPENDENT_AMBULATORY_CARE_PROVIDER_SITE_OTHER): Payer: Self-pay | Admitting: Specialist

## 2017-02-21 MED ORDER — HYDROXYZINE PAMOATE 50 MG PO CAPS: ORAL_CAPSULE | ORAL | 0 refills | Status: DC

## 2017-02-22 ENCOUNTER — Telehealth (INDEPENDENT_AMBULATORY_CARE_PROVIDER_SITE_OTHER): Payer: Self-pay | Admitting: Specialist

## 2017-02-22 NOTE — Telephone Encounter (Signed)
RECORDS 12/13/2015 TO PRESENT FAXED DDS [phone]

## 2017-02-28 ENCOUNTER — Other Ambulatory Visit: Payer: Self-pay | Admitting: Family Medicine

## 2017-02-28 ENCOUNTER — Telehealth: Payer: Self-pay | Admitting: Family Medicine

## 2017-02-28 MED ORDER — DICLOFENAC SODIUM 75 MG PO TBEC: 75.0000 mg | DELAYED_RELEASE_TABLET | Freq: Two times a day (BID) | ORAL | 2 refills | Status: DC | PRN

## 2017-02-28 NOTE — Telephone Encounter (Signed)
Would like something for pain. She is not able to have the surgery on her back due to financial reasons. Please advise

## 2017-02-28 NOTE — Telephone Encounter (Signed)
Please advise patient I have refilled her Voltren. We will not be prescribing Narcotic in our clinic to her. Also recommend return to previous pain clinic since [facility] was not approved by the provider/specialist.

## 2017-03-01 ENCOUNTER — Telehealth (INDEPENDENT_AMBULATORY_CARE_PROVIDER_SITE_OTHER): Payer: Self-pay

## 2017-03-01 ENCOUNTER — Other Ambulatory Visit (INDEPENDENT_AMBULATORY_CARE_PROVIDER_SITE_OTHER): Payer: Self-pay | Admitting: Specialist

## 2017-03-01 MED ORDER — HYDROCODONE-ACETAMINOPHEN 5-325 MG PO TABS: 1.0000 | ORAL_TABLET | Freq: Four times a day (QID) | ORAL | 0 refills | Status: DC | PRN

## 2017-03-01 NOTE — Telephone Encounter (Signed)
Hydrocodone prescribed for pain preoperatively, I will not prescribe oxycodone, as it makes her resistant to narcotics and they will not help with post operative pain.

## 2017-03-01 NOTE — Telephone Encounter (Signed)
Pt states [place] and doesn't recall previous pain clinic. ep

## 2017-03-01 NOTE — Telephone Encounter (Signed)
Looked into referral history and saw that patient recently saw Dr. Alvester MorinNewton and advised her to call their office and see about scheduling an appointment.  Also advised her that her options are limited per referral notes and she can try [facility] but the wait can be awhile so she needed to go back to Dr. Alvester MorinNewton and give them another chance.  Explained to patient that even if she didn't really care for his personality, if his treatment worked than that is most important.  Patient voiced understanding and will contact their office. Jazmin Hartsell,CMA

## 2017-03-01 NOTE — Telephone Encounter (Signed)
Planning surgery for 03/14/17.  Please advise what she can do in mean time for pain.  [phone]

## 2017-03-01 NOTE — Telephone Encounter (Signed)
LM for patient to call back. Jazmin Hartsell,CMA  

## 2017-03-02 NOTE — Progress Notes (Signed)
I called and advised patient that her rx is ready for pick up at the front desk. 

## 2017-03-06 ENCOUNTER — Telehealth: Payer: Self-pay | Admitting: Family Medicine

## 2017-03-06 NOTE — Telephone Encounter (Signed)
Will forward to Dr. Lum BabeEniola and RN clinic to see if form was ever received.  Jazmin Hartsell,CMA

## 2017-03-06 NOTE — Telephone Encounter (Signed)
Spoke with patient and informed her that form or letter was never received by our office.  Advised her to contact her surgeons office regarding the letter she received and see what they can tell her.  Patient voiced understanding and will let us know if there is anything we need to do. Mylin Hirano,CMA

## 2017-03-06 NOTE — Telephone Encounter (Signed)
Pt states forms were sent over on March 7th [facility] upcoming [facility] (surgery is scheduled for April 3rd). They need additional information before they can approve her surgery. Pt did not have any contact info for [facility]. ep

## 2017-03-06 NOTE — Telephone Encounter (Signed)
If this is asking for prior authorization for surgery, this does not come to me.  I only do PAs for medication.  Clovis PuMartin, Tamika L, RN

## 2017-03-06 NOTE — Telephone Encounter (Signed)
Please advise patient. I did not get [facility]. She need to contact her surgeon's office for further information and detail about the surgery.

## 2017-03-06 NOTE — Telephone Encounter (Signed)
If asking for prior authorization it goes to [place] I believe.

## 2017-03-06 NOTE — Telephone Encounter (Signed)
Will wait and see if provider received a form in her box for this patient before contacting the patient and having her resend the form to our office. Dov Dill,CMA

## 2017-03-07 NOTE — Pre-Procedure Instructions (Signed)
Lisa Newton  03/07/2017      [id number] [place], [place] - [address] [place] [address] Phone: [phone] Fax: [phone]  [facility] [address]), [place] - [address] [place] ([place]) [place] [address] Phone: [phone] Fax: [phone]  [facility] [address] [place], [place] - [address] [place] [address] Phone: [phone] Fax: [phone]    Your procedure is scheduled on April 3.  Report to [facility] Admitting at [phone].M.  Call this number if you have problems the morning of surgery:  [phone]   Remember:  Do not eat food or drink liquids after midnight.  Take these medicines the morning of surgery with A SIP OF WATER Flexeril if needed, Gabapentin (Neurontin), Hydrocodone (Norco) or Oxycodone (percocet) if needed Sertraline (Zoloft)   Stop taking aspirin, BC's, Goody's, Herbal medications, Fish Oil, Vitamins, Aleve, Ibuprofen, Advil, Motrin, Diclofenac (Voltaren)   Do not wear jewelry, make-up or nail polish.  Do not wear lotions, powders, or perfumes, or deoderant.  Do not shave 48 hours prior to surgery.  Men may shave face and neck.  Do not bring valuables to the hospital.  [facility] is not responsible for any belongings or valuables.  Contacts, dentures or bridgework may not be worn into surgery.  Leave your suitcase in the car.  After surgery it may be brought to your room.  For patients admitted to the hospital, discharge time will be determined by your treatment team.  Patients discharged the day of surgery will not be allowed to drive home.    Special instructions: [facility] - Preparing for Surgery  Before surgery, you can play an important role.  Because skin is not sterile, your skin needs to be as free of germs as possible.  You can reduce the number of germs on you skin by washing with CHG  (chlorahexidine gluconate) soap before surgery.  CHG is an antiseptic cleaner which kills germs and bonds with the skin to continue killing germs even after washing.  Please DO NOT use if you have an allergy to CHG or antibacterial soaps.  If your skin becomes reddened/irritated stop using the CHG and inform your nurse when you arrive at Short Stay.  Do not shave (including legs and underarms) for at least 48 hours prior to the first CHG shower.  You may shave your face.  Please follow these instructions carefully:   1.  Shower with CHG Soap the night before surgery and the                                morning of Surgery.  2.  If you choose to wash your hair, wash your hair first as usual with your  normal shampoo.  3.  After you shampoo, rinse your hair and body thoroughly to remove the                      Shampoo.  4.  Use CHG as you would any other liquid soap.  You can apply chg directly  to the skin and wash gently with scrungie or a clean washcloth.  5.  Apply the CHG Soap to your body ONLY FROM THE NECK DOWN.  Do not use on open wounds or open sores.  Avoid contact with your eyes, ears, mouth  and genitals (private parts).  Wash genitals (private parts)  with your normal soap.  6.  Wash thoroughly, paying special attention to the area where your surgery  will be performed.  7.  Thoroughly rinse your body with warm water from the neck down.  8.  DO NOT shower/wash with your normal soap after using and rinsing off  the CHG Soap.  9.  Pat yourself dry with a clean towel.            10.  Wear clean pajamas.            11.  Place clean sheets on your bed the night of your first shower and do not  sleep with pets.  Day of Surgery  Do not apply any lotions/deoderants the morning of surgery.  Please wear clean clothes to the hospital/surgery center.     Please read over the following fact sheets that you were given. Pain Booklet, Coughing and Deep Breathing, MRSA Information and Surgical  Site Infection Prevention

## 2017-03-08 ENCOUNTER — Encounter (HOSPITAL_COMMUNITY)
Admission: RE | Admit: 2017-03-08 | Discharge: 2017-03-08 | Disposition: A | Discharge status: Home / Self Care | Payer: Medicaid Other | Source: Ambulatory Visit | Attending: Specialist | Admitting: Specialist

## 2017-03-08 ENCOUNTER — Encounter (HOSPITAL_COMMUNITY): Payer: Self-pay

## 2017-03-08 DIAGNOSIS — Z01818 Encounter for other preprocedural examination: Secondary | ICD-10-CM | POA: Diagnosis present

## 2017-03-08 DIAGNOSIS — Z01812 Encounter for preprocedural laboratory examination: Secondary | ICD-10-CM | POA: Diagnosis not present

## 2017-03-08 HISTORY — DX: Unspecified osteoarthritis, unspecified site: M19.90

## 2017-03-08 HISTORY — DX: Essential (primary) hypertension: I10

## 2017-03-08 LAB — COMPREHENSIVE METABOLIC PANEL
ALT: 13 U/L — ABNORMAL LOW (ref 14–54)
ANION GAP: 8 (ref 5–15)
AST: 20 U/L (ref 15–41)
Albumin: 3.9 g/dL (ref 3.5–5.0)
Alkaline Phosphatase: 62 U/L (ref 38–126)
CHLORIDE: 107 mmol/L (ref 101–111)
CO2: 22 mmol/L (ref 22–32)
CREATININE: 0.77 mg/dL (ref 0.44–1.00)
Calcium: 8.8 mg/dL — ABNORMAL LOW (ref 8.9–10.3)
GFR calc non Af Amer: >60 mL/min (ref >60)
Glucose, Bld: 102 mg/dL — ABNORMAL HIGH (ref 65–99)
POTASSIUM: 3.8 mmol/L (ref 3.5–5.1)
Sodium: 137 mmol/L (ref 135–145)
TOTAL PROTEIN: 6.7 g/dL (ref 6.5–8.1)
Total Bilirubin: 0.6 mg/dL (ref 0.3–1.2)

## 2017-03-08 LAB — CBC
HCT: 39.1 % (ref 36.0–46.0)
Hemoglobin: 13.1 g/dL (ref 12.0–15.0)
MCH: 30.9 pg (ref 26.0–34.0)
MCHC: 33.5 g/dL (ref 30.0–36.0)
MCV: 92.2 fL (ref 78.0–100.0)
Platelets: 228 10*3/uL (ref 150–400)
RBC: 4.24 MIL/uL (ref 3.87–5.11)
RDW: 13.3 % (ref 11.5–15.5)
WBC: 8.1 10*3/uL (ref 4.0–10.5)

## 2017-03-08 LAB — SURGICAL PCR SCREEN
MRSA, PCR: NEGATIVE
Staphylococcus aureus: NEGATIVE

## 2017-03-08 LAB — APTT: APTT: 30 s (ref 24–36)

## 2017-03-08 LAB — PROTIME-INR
INR: 0.96
PROTHROMBIN TIME: 12.7 s (ref 11.4–15.2)

## 2017-03-08 LAB — HCG, SERUM, QUALITATIVE: PREG SERUM: NEGATIVE

## 2017-03-08 NOTE — Progress Notes (Signed)
PCP is Dr. Janit PaganKehinde Eniola Denies ever seeing a cardilogist. Denies any chest pain. Denies ever having a stress test, echo, or card cath.

## 2017-03-13 NOTE — Anesthesia Preprocedure Evaluation (Addendum)
Anesthesia Evaluation   Patient awake    Reviewed: Allergy & Precautions, NPO status , Patient's Chart, lab work & pertinent test results  Airway Mallampati: II  TM Distance: >3 FB Neck ROM: Full    Dental  (+) Teeth Intact   Pulmonary    breath sounds clear to auscultation       Cardiovascular hypertension,  Rhythm:Regular Rate:Normal     Neuro/Psych PSYCHIATRIC DISORDERS Depression  Neuromuscular disease    GI/Hepatic   Endo/Other    Renal/GU      Musculoskeletal  (+) Arthritis ,   Abdominal   Peds  Hematology   Anesthesia Other Findings   Reproductive/Obstetrics                            Anesthesia Physical Anesthesia Plan  ASA: II  Anesthesia Plan: General   Post-op Pain Management:    Induction:   Airway Management Planned: Oral ETT  Additional Equipment:   Intra-op Plan:   Post-operative Plan: Extubation in OR  Informed Consent: I have reviewed the patients History and Physical, chart, labs and discussed the procedure including the risks, benefits and alternatives for the proposed anesthesia with the patient or authorized representative who has indicated his/her understanding and acceptance.   Dental advisory given  Plan Discussed with: CRNA  Anesthesia Plan Comments: (Patient currently menstrusting. HCG negative 03/06/17)       Anesthesia Quick Evaluation

## 2017-03-14 ENCOUNTER — Observation Stay (HOSPITAL_COMMUNITY)
Admission: RE | Admit: 2017-03-14 | Discharge: 2017-03-15 | Disposition: A | Discharge status: Home / Self Care | Payer: Medicaid Other | Source: Ambulatory Visit | Attending: Specialist | Admitting: Specialist

## 2017-03-14 ENCOUNTER — Ambulatory Visit (HOSPITAL_COMMUNITY): Payer: Medicaid Other | Admitting: Anesthesiology

## 2017-03-14 ENCOUNTER — Ambulatory Visit (HOSPITAL_COMMUNITY)
Admission: RE | Disposition: A | Discharge status: Home / Self Care | Payer: Self-pay | Source: Ambulatory Visit | Attending: Specialist

## 2017-03-14 ENCOUNTER — Encounter (HOSPITAL_COMMUNITY): Payer: Self-pay | Admitting: *Deleted

## 2017-03-14 ENCOUNTER — Ambulatory Visit (HOSPITAL_COMMUNITY): Payer: Medicaid Other

## 2017-03-14 DIAGNOSIS — M5020 Other cervical disc displacement, unspecified cervical region: Secondary | ICD-10-CM | POA: Diagnosis present

## 2017-03-14 DIAGNOSIS — M5010 Cervical disc disorder with radiculopathy, unspecified cervical region: Secondary | ICD-10-CM

## 2017-03-14 DIAGNOSIS — M2578 Osteophyte, vertebrae: Secondary | ICD-10-CM | POA: Insufficient documentation

## 2017-03-14 DIAGNOSIS — Z419 Encounter for procedure for purposes other than remedying health state, unspecified: Secondary | ICD-10-CM

## 2017-03-14 DIAGNOSIS — I10 Essential (primary) hypertension: Secondary | ICD-10-CM | POA: Insufficient documentation

## 2017-03-14 DIAGNOSIS — M50123 Cervical disc disorder at C6-C7 level with radiculopathy: Principal | ICD-10-CM | POA: Insufficient documentation

## 2017-03-14 DIAGNOSIS — M542 Cervicalgia: Secondary | ICD-10-CM | POA: Diagnosis present

## 2017-03-14 DIAGNOSIS — F329 Major depressive disorder, single episode, unspecified: Secondary | ICD-10-CM | POA: Diagnosis not present

## 2017-03-14 DIAGNOSIS — Z79899 Other long term (current) drug therapy: Secondary | ICD-10-CM | POA: Diagnosis not present

## 2017-03-14 DIAGNOSIS — M5412 Radiculopathy, cervical region: Secondary | ICD-10-CM

## 2017-03-14 DIAGNOSIS — H70202 Unspecified petrositis, left ear: Secondary | ICD-10-CM

## 2017-03-14 HISTORY — PX: POSTERIOR CERVICAL FUSION/FORAMINOTOMY: SHX5038

## 2017-03-14 HISTORY — DX: Cervical disc disorder with radiculopathy, unspecified cervical region: M50.10

## 2017-03-14 SURGERY — POSTERIOR CERVICAL FUSION/FORAMINOTOMY LEVEL 1
Anesthesia: General

## 2017-03-14 MED ORDER — THROMBIN 20000 UNITS EX KIT
PACK | CUTANEOUS | Status: DC | PRN
  Administered 2017-03-14: 20000 [IU] via TOPICAL

## 2017-03-14 MED ORDER — VANCOMYCIN HCL IN DEXTROSE 1-5 GM/200ML-% IV SOLN
1000.0000 mg | INTRAVENOUS | Status: AC
  Administered 2017-03-14: 1000 mg via INTRAVENOUS

## 2017-03-14 MED ORDER — HYDROCODONE-ACETAMINOPHEN 7.5-325 MG PO TABS
1.0000 | ORAL_TABLET | Freq: Four times a day (QID) | ORAL | Status: DC
  Administered 2017-03-14: 1 via ORAL
  Filled 2017-03-14: qty 1

## 2017-03-14 MED ORDER — OXYCODONE-ACETAMINOPHEN 5-325 MG PO TABS: 1.0000 | ORAL_TABLET | Freq: Four times a day (QID) | ORAL | 0 refills | Status: DC | PRN

## 2017-03-14 MED ORDER — PHENYLEPHRINE HCL 10 MG/ML IJ SOLN
INTRAVENOUS | Status: DC | PRN
  Administered 2017-03-14: 30 ug/min via INTRAVENOUS

## 2017-03-14 MED ORDER — BUPIVACAINE LIPOSOME 1.3 % IJ SUSP
20.0000 mL | INTRAMUSCULAR | Status: AC
  Administered 2017-03-14: 4.5 mL
  Filled 2017-03-14: qty 20

## 2017-03-14 MED ORDER — HYDROCODONE-ACETAMINOPHEN 7.5-325 MG PO TABS
1.0000 | ORAL_TABLET | ORAL | Status: DC | PRN
  Administered 2017-03-14 – 2017-03-15 (×4): 2 via ORAL
  Filled 2017-03-14 (×4): qty 2

## 2017-03-14 MED ORDER — HYDROMORPHONE HCL 1 MG/ML IJ SOLN
INTRAMUSCULAR | Status: AC
  Filled 2017-03-14: qty 1

## 2017-03-14 MED ORDER — HYDROMORPHONE HCL 1 MG/ML IJ SOLN
0.2500 mg | INTRAMUSCULAR | Status: DC | PRN
  Administered 2017-03-14 (×3): 0.5 mg via INTRAVENOUS

## 2017-03-14 MED ORDER — BUPIVACAINE HCL 0.5 % IJ SOLN
INTRAMUSCULAR | Status: DC | PRN
  Administered 2017-03-14: 3 mL
  Administered 2017-03-14: 4.5 mL

## 2017-03-14 MED ORDER — ONDANSETRON HCL 4 MG/2ML IJ SOLN: 4.0000 mg | Freq: Once | INTRAMUSCULAR | Status: DC | PRN

## 2017-03-14 MED ORDER — PANTOPRAZOLE SODIUM 40 MG PO TBEC
40.0000 mg | DELAYED_RELEASE_TABLET | Freq: Every day | ORAL | Status: DC
  Administered 2017-03-14: 40 mg via ORAL
  Filled 2017-03-14: qty 1

## 2017-03-14 MED ORDER — ATORVASTATIN CALCIUM 20 MG PO TABS
20.0000 mg | ORAL_TABLET | Freq: Every day | ORAL | Status: DC
  Administered 2017-03-14: 20 mg via ORAL
  Filled 2017-03-14: qty 1

## 2017-03-14 MED ORDER — THROMBIN 20000 UNITS EX SOLR
CUTANEOUS | Status: AC
  Filled 2017-03-14: qty 20000

## 2017-03-14 MED ORDER — FENTANYL CITRATE (PF) 100 MCG/2ML IJ SOLN
INTRAMUSCULAR | Status: DC | PRN
  Administered 2017-03-14 (×2): 50 ug via INTRAVENOUS

## 2017-03-14 MED ORDER — CHLORHEXIDINE GLUCONATE 4 % EX LIQD: 60.0000 mL | Freq: Once | CUTANEOUS | Status: DC

## 2017-03-14 MED ORDER — SODIUM CHLORIDE 0.9 % IV SOLN
250.0000 mL | INTRAVENOUS | Status: DC
Start: 2017-03-14 — End: 2017-03-15

## 2017-03-14 MED ORDER — ALUM & MAG HYDROXIDE-SIMETH 200-200-20 MG/5ML PO SUSP: 30.0000 mL | Freq: Four times a day (QID) | ORAL | Status: DC | PRN

## 2017-03-14 MED ORDER — FENTANYL CITRATE (PF) 250 MCG/5ML IJ SOLN
INTRAMUSCULAR | Status: AC
  Filled 2017-03-14: qty 5

## 2017-03-14 MED ORDER — KETOROLAC TROMETHAMINE 15 MG/ML IJ SOLN
7.5000 mg | Freq: Four times a day (QID) | INTRAMUSCULAR | Status: AC
  Administered 2017-03-14 – 2017-03-15 (×4): 7.5 mg via INTRAVENOUS
  Filled 2017-03-14 (×4): qty 1

## 2017-03-14 MED ORDER — HYDROMORPHONE HCL 1 MG/ML IJ SOLN
INTRAMUSCULAR | Status: AC
  Filled 2017-03-14: qty 0.5

## 2017-03-14 MED ORDER — MIDAZOLAM HCL 2 MG/2ML IJ SOLN
INTRAMUSCULAR | Status: AC
  Filled 2017-03-14: qty 2

## 2017-03-14 MED ORDER — PROPOFOL 10 MG/ML IV BOLUS
INTRAVENOUS | Status: AC
  Filled 2017-03-14: qty 40

## 2017-03-14 MED ORDER — BUPIVACAINE HCL (PF) 0.5 % IJ SOLN
INTRAMUSCULAR | Status: AC
  Filled 2017-03-14: qty 30

## 2017-03-14 MED ORDER — PHENOL 1.4 % MT LIQD
1.0000 | OROMUCOSAL | Status: DC | PRN
Start: 2017-03-14 — End: 2017-03-15
  Administered 2017-03-14: 1 via OROMUCOSAL
  Filled 2017-03-14: qty 177

## 2017-03-14 MED ORDER — VANCOMYCIN HCL IN DEXTROSE 1-5 GM/200ML-% IV SOLN
INTRAVENOUS | Status: AC
  Filled 2017-03-14: qty 200

## 2017-03-14 MED ORDER — SERTRALINE HCL 50 MG PO TABS
50.0000 mg | ORAL_TABLET | Freq: Every day | ORAL | Status: DC
  Administered 2017-03-15: 50 mg via ORAL
  Filled 2017-03-14: qty 1

## 2017-03-14 MED ORDER — GABAPENTIN 300 MG PO CAPS
300.0000 mg | ORAL_CAPSULE | Freq: Three times a day (TID) | ORAL | Status: DC
  Administered 2017-03-14 – 2017-03-15 (×3): 300 mg via ORAL
  Filled 2017-03-14: qty 1
  Filled 2017-03-14: qty 3
  Filled 2017-03-14: qty 1

## 2017-03-14 MED ORDER — MENTHOL 3 MG MT LOZG
1.0000 | LOZENGE | OROMUCOSAL | Status: DC | PRN
  Filled 2017-03-14: qty 9

## 2017-03-14 MED ORDER — VANCOMYCIN HCL IN DEXTROSE 1-5 GM/200ML-% IV SOLN
1000.0000 mg | Freq: Once | INTRAVENOUS | Status: AC
  Administered 2017-03-14: 1000 mg via INTRAVENOUS
  Filled 2017-03-14: qty 200

## 2017-03-14 MED ORDER — DEXAMETHASONE SODIUM PHOSPHATE 10 MG/ML IJ SOLN
INTRAMUSCULAR | Status: DC | PRN
  Administered 2017-03-14: 10 mg via INTRAVENOUS

## 2017-03-14 MED ORDER — POLYETHYLENE GLYCOL 3350 17 G PO PACK: 17.0000 g | PACK | Freq: Every day | ORAL | Status: DC | PRN

## 2017-03-14 MED ORDER — HYDROXYZINE HCL 50 MG PO TABS
50.0000 mg | ORAL_TABLET | Freq: Three times a day (TID) | ORAL | Status: DC | PRN
  Filled 2017-03-14: qty 1

## 2017-03-14 MED ORDER — BIOTIN 1000 MCG PO TABS: 1000.0000 ug | ORAL_TABLET | Freq: Every day | ORAL | Status: DC

## 2017-03-14 MED ORDER — SODIUM CHLORIDE 0.9% FLUSH
3.0000 mL | Freq: Two times a day (BID) | INTRAVENOUS | Status: DC
Start: 2017-03-14 — End: 2017-03-15
  Administered 2017-03-14 (×2): 3 mL via INTRAVENOUS

## 2017-03-14 MED ORDER — ROCURONIUM BROMIDE 10 MG/ML (PF) SYRINGE
PREFILLED_SYRINGE | INTRAVENOUS | Status: DC | PRN
  Administered 2017-03-14: 40 mg via INTRAVENOUS
  Administered 2017-03-14: 10 mg via INTRAVENOUS

## 2017-03-14 MED ORDER — LIDOCAINE 2% (20 MG/ML) 5 ML SYRINGE
INTRAMUSCULAR | Status: DC | PRN
  Administered 2017-03-14: 80 mg via INTRAVENOUS

## 2017-03-14 MED ORDER — ACETAMINOPHEN 325 MG PO TABS: 650.0000 mg | ORAL_TABLET | ORAL | Status: DC | PRN

## 2017-03-14 MED ORDER — BISACODYL 5 MG PO TBEC: 5.0000 mg | DELAYED_RELEASE_TABLET | Freq: Every day | ORAL | Status: DC | PRN

## 2017-03-14 MED ORDER — ONDANSETRON HCL 4 MG PO TABS: 4.0000 mg | ORAL_TABLET | Freq: Four times a day (QID) | ORAL | Status: DC | PRN

## 2017-03-14 MED ORDER — DOCUSATE SODIUM 100 MG PO CAPS
100.0000 mg | ORAL_CAPSULE | Freq: Two times a day (BID) | ORAL | Status: DC
  Administered 2017-03-14 – 2017-03-15 (×2): 100 mg via ORAL
  Filled 2017-03-14 (×2): qty 1

## 2017-03-14 MED ORDER — SCOPOLAMINE 1 MG/3DAYS TD PT72
MEDICATED_PATCH | TRANSDERMAL | Status: DC | PRN
  Administered 2017-03-14: 1 via TRANSDERMAL

## 2017-03-14 MED ORDER — PROPOFOL 10 MG/ML IV BOLUS
INTRAVENOUS | Status: DC | PRN
  Administered 2017-03-14: 170 mg via INTRAVENOUS

## 2017-03-14 MED ORDER — PHENYLEPHRINE 40 MCG/ML (10ML) SYRINGE FOR IV PUSH (FOR BLOOD PRESSURE SUPPORT)
PREFILLED_SYRINGE | INTRAVENOUS | Status: DC | PRN
  Administered 2017-03-14: 120 ug via INTRAVENOUS
  Administered 2017-03-14: 80 ug via INTRAVENOUS
  Administered 2017-03-14 (×3): 40 ug via INTRAVENOUS

## 2017-03-14 MED ORDER — SUGAMMADEX SODIUM 200 MG/2ML IV SOLN
INTRAVENOUS | Status: DC | PRN
  Administered 2017-03-14: 100 mg via INTRAVENOUS

## 2017-03-14 MED ORDER — ONDANSETRON HCL 4 MG/2ML IJ SOLN: 4.0000 mg | Freq: Four times a day (QID) | INTRAMUSCULAR | Status: DC | PRN

## 2017-03-14 MED ORDER — IBUPROFEN 200 MG PO TABS: 400.0000 mg | ORAL_TABLET | ORAL | Status: DC | PRN

## 2017-03-14 MED ORDER — ACETAMINOPHEN 650 MG RE SUPP: 650.0000 mg | RECTAL | Status: DC | PRN

## 2017-03-14 MED ORDER — ONDANSETRON HCL 4 MG/2ML IJ SOLN
INTRAMUSCULAR | Status: DC | PRN
  Administered 2017-03-14: 4 mg via INTRAVENOUS

## 2017-03-14 MED ORDER — SODIUM CHLORIDE 0.9% FLUSH: 3.0000 mL | INTRAVENOUS | Status: DC | PRN

## 2017-03-14 MED ORDER — LACTATED RINGERS IV SOLN
INTRAVENOUS | Status: DC | PRN
  Administered 2017-03-14 (×2): via INTRAVENOUS

## 2017-03-14 MED ORDER — FLEET ENEMA 7-19 GM/118ML RE ENEM: 1.0000 | ENEMA | Freq: Once | RECTAL | Status: DC | PRN

## 2017-03-14 MED ORDER — MIDAZOLAM HCL 5 MG/5ML IJ SOLN
INTRAMUSCULAR | Status: DC | PRN
  Administered 2017-03-14: 2 mg via INTRAVENOUS

## 2017-03-14 MED ORDER — HYDROCHLOROTHIAZIDE 12.5 MG PO CAPS
12.5000 mg | ORAL_CAPSULE | Freq: Every day | ORAL | Status: DC
  Administered 2017-03-14 – 2017-03-15 (×2): 12.5 mg via ORAL
  Filled 2017-03-14 (×2): qty 1

## 2017-03-14 MED ORDER — MORPHINE SULFATE (PF) 4 MG/ML IV SOLN
1.0000 mg | INTRAVENOUS | Status: DC | PRN
Start: 2017-03-14 — End: 2017-03-15
  Administered 2017-03-14 (×2): 1 mg via INTRAVENOUS
  Filled 2017-03-14 (×2): qty 1

## 2017-03-14 MED ORDER — PANTOPRAZOLE SODIUM 40 MG IV SOLR: 40.0000 mg | Freq: Every day | INTRAVENOUS | Status: DC

## 2017-03-14 MED ORDER — MEPERIDINE HCL 25 MG/ML IJ SOLN: 6.2500 mg | INTRAMUSCULAR | Status: DC | PRN

## 2017-03-14 MED ORDER — SODIUM CHLORIDE 0.9 % IV SOLN
INTRAVENOUS | Status: DC
Start: 2017-03-14 — End: 2017-03-15

## 2017-03-14 SURGICAL SUPPLY — 66 items
ADH SKN CLS APL DERMABOND .7 (GAUZE/BANDAGES/DRESSINGS) ×1
APL SKNCLS STERI-STRIP NONHPOA (GAUZE/BANDAGES/DRESSINGS) ×1
BENZOIN TINCTURE PRP APPL 2/3 (GAUZE/BANDAGES/DRESSINGS) ×2 IMPLANT
BIT DRILL NEURO 2X3.1 SFT TUCH (MISCELLANEOUS) IMPLANT
BLADE CLIPPER SURG (BLADE) IMPLANT
BUR RND FLUTED 2.5 (BURR) ×2 IMPLANT
CLOSURE WOUND 1/2 X4 (GAUZE/BANDAGES/DRESSINGS) ×1
COLLAR CERV LO CONTOUR FIRM DE (SOFTGOODS) ×3 IMPLANT
COVER SURGICAL LIGHT HANDLE (MISCELLANEOUS) ×3 IMPLANT
DERMABOND ADVANCED (GAUZE/BANDAGES/DRESSINGS) ×2
DERMABOND ADVANCED .7 DNX12 (GAUZE/BANDAGES/DRESSINGS) ×1 IMPLANT
DRAPE C-ARM 42X72 X-RAY (DRAPES) ×3 IMPLANT
DRAPE LAPAROTOMY T 102X78X121 (DRAPES) ×3 IMPLANT
DRAPE MICROSCOPE LEICA (MISCELLANEOUS) IMPLANT
DRAPE PROXIMA HALF (DRAPES) ×6 IMPLANT
DRAPE SURG 17X23 STRL (DRAPES) ×12 IMPLANT
DRILL NEURO 2X3.1 SOFT TOUCH (MISCELLANEOUS)
DRSG MEPILEX BORDER 4X4 (GAUZE/BANDAGES/DRESSINGS) IMPLANT
DRSG MEPILEX BORDER 4X8 (GAUZE/BANDAGES/DRESSINGS) IMPLANT
DRSG PAD ABDOMINAL 8X10 ST (GAUZE/BANDAGES/DRESSINGS) ×4 IMPLANT
DURAPREP 6ML APPLICATOR 50/CS (WOUND CARE) ×3 IMPLANT
ELECT CAUTERY BLADE 6.4 (BLADE) ×3 IMPLANT
ELECT REM PT RETURN 9FT ADLT (ELECTROSURGICAL) ×3
ELECTRODE REM PT RTRN 9FT ADLT (ELECTROSURGICAL) ×1 IMPLANT
EVACUATOR 1/8 PVC DRAIN (DRAIN) IMPLANT
GAUZE SPONGE 4X4 12PLY STRL (GAUZE/BANDAGES/DRESSINGS) ×3 IMPLANT
GLOVE BIOGEL PI IND STRL 7.5 (GLOVE) IMPLANT
GLOVE BIOGEL PI IND STRL 8 (GLOVE) ×1 IMPLANT
GLOVE BIOGEL PI IND STRL 9 (GLOVE) IMPLANT
GLOVE BIOGEL PI INDICATOR 7.5 (GLOVE) ×2
GLOVE BIOGEL PI INDICATOR 8 (GLOVE)
GLOVE BIOGEL PI INDICATOR 9 (GLOVE) ×2
GLOVE ECLIPSE 9.0 STRL (GLOVE) ×1 IMPLANT
GLOVE ORTHO TXT STRL SZ7.5 (GLOVE) ×1 IMPLANT
GLOVE SURG 8.5 LATEX PF (GLOVE) ×1 IMPLANT
GLOVE SURG SS PI 7.5 STRL IVOR (GLOVE) ×2 IMPLANT
GLOVE SURG SS PI 8.5 STRL IVOR (GLOVE) ×2
GLOVE SURG SS PI 8.5 STRL STRW (GLOVE) IMPLANT
GOWN STRL REUS W/ TWL LRG LVL3 (GOWN DISPOSABLE) ×1 IMPLANT
GOWN STRL REUS W/TWL 2XL LVL3 (GOWN DISPOSABLE) ×6 IMPLANT
GOWN STRL REUS W/TWL LRG LVL3 (GOWN DISPOSABLE) ×6
KIT BASIN OR (CUSTOM PROCEDURE TRAY) ×3 IMPLANT
KIT ROOM TURNOVER OR (KITS) ×3 IMPLANT
NDL SPNL 18GX3.5 QUINCKE PK (NEEDLE) ×1 IMPLANT
NEEDLE SPNL 18GX3.5 QUINCKE PK (NEEDLE) ×3 IMPLANT
NS IRRIG 1000ML POUR BTL (IV SOLUTION) ×3 IMPLANT
PACK ORTHO CERVICAL (CUSTOM PROCEDURE TRAY) ×3 IMPLANT
PAD ARMBOARD 7.5X6 YLW CONV (MISCELLANEOUS) ×6 IMPLANT
PATTIES SURGICAL .25X.25 (GAUZE/BANDAGES/DRESSINGS) ×3 IMPLANT
PATTIES SURGICAL .75X.75 (GAUZE/BANDAGES/DRESSINGS) ×2 IMPLANT
SPONGE SURGIFOAM ABS GEL 100 (HEMOSTASIS) ×3 IMPLANT
STAPLER SKIN PROX WIDE 3.9 (STAPLE) ×2 IMPLANT
STRIP CLOSURE SKIN 1/2X4 (GAUZE/BANDAGES/DRESSINGS) ×1 IMPLANT
SUT ETHIBOND CT1 BRD #0 30IN (SUTURE) IMPLANT
SUT VIC AB 0 CT1 27 (SUTURE) ×3
SUT VIC AB 0 CT1 27XBRD ANBCTR (SUTURE) IMPLANT
SUT VIC AB 2-0 CT1 27 (SUTURE) ×3
SUT VIC AB 2-0 CT1 TAPERPNT 27 (SUTURE) IMPLANT
SUT VIC AB 2-0 UR6 27 (SUTURE) IMPLANT
SUT VIC AB 3-0 X1 27 (SUTURE) ×3 IMPLANT
SUT VICRYL 0 UR6 27IN ABS (SUTURE) ×3 IMPLANT
TAPE CLOTH 4X10 WHT NS (GAUZE/BANDAGES/DRESSINGS) ×3 IMPLANT
TOWEL OR 17X24 6PK STRL BLUE (TOWEL DISPOSABLE) ×1 IMPLANT
TOWEL OR 17X26 10 PK STRL BLUE (TOWEL DISPOSABLE) ×1 IMPLANT
TRAY FOLEY W/METER SILVER 16FR (SET/KITS/TRAYS/PACK) IMPLANT
WATER STERILE IRR 1000ML POUR (IV SOLUTION) ×3 IMPLANT

## 2017-03-14 NOTE — Brief Op Note (Signed)
03/14/2017  9:36 AM  PATIENT:  Lisa Newton  46 y.o. female  PRE-OPERATIVE DIAGNOSIS:  left C6-7 herniated disc  POST-OPERATIVE DIAGNOSIS:  left C6-7 herniated disc  PROCEDURE:  Procedure(s): LEFT C6-7 FORAMINOTOMY WITH EXCISION OF HERNIATED NUCLEUS PULPOSUS (N/A)  SURGEON:  Surgeon(s) and Role:    * Kerrin Champagne, MD - Primary  PHYSICIAN ASSISTANT:Asyia Hornung Barry Dienes, PA-C  ANESTHESIA:   local and general, Dr. Jethro Bolus.  EBL:  Total I/O In: 1000 [I.V.:1000] Out: 200 [Blood:200]  BLOOD ADMINISTERED:none  DRAINS: none   LOCAL MEDICATIONS USED:  MARCAINE 0.5% 1:1 EXPAREL 1.3%  Amount: 10 ml  FINDINGS: Cervical disc herniation left C6-7 with left C7 nerve compression.  SPECIMEN:  No Specimen  DISPOSITION OF SPECIMEN:  N/A  COUNTS:  YES  TOURNIQUET:  * No tourniquets in log *  DICTATION: .Dragon Dictation  PLAN OF CARE: Admit for overnight observation  PATIENT DISPOSITION:  PACU - hemodynamically stable.   Delay start of Pharmacological VTE agent (>24hrs) due to surgical blood loss or risk of bleeding: yes

## 2017-03-14 NOTE — Progress Notes (Signed)
     Subjective: Day of Surgery Procedure(s) (LRB): LEFT C6-7 FORAMINOTOMY WITH EXCISION OF HERNIATED NUCLEUS PULPOSUS (N/A) Patient was seen in the PACU post surgery. Patient reports pain as moderate.    Objective:   VITALS:  Temp:  [97.3 F (36.3 C)-98.3 F (36.8 C)] 97.3 F (36.3 C) (04/03 1123) Pulse Rate:  [85-93] 93 (04/03 1123) Resp:  [10-20] 14 (04/03 1123) BP: (106-136)/(68-95) 129/73 (04/03 1123) SpO2:  [96 %-100 %] 99 % (04/03 1123) Weight:  [201 lb (91.2 kg)] 201 lb (91.2 kg) (04/03 0548)  Neurologically intact ABD soft Neurovascular intact Sensation intact distally Intact pulses distally Dorsiflexion/Plantar flexion intact Incision: dressing C/D/I   LABS No results for input(s): HGB, WBC, PLT in the last 72 hours. No results for input(s): NA, K, CL, CO2, BUN, CREATININE, GLUCOSE in the last 72 hours. No results for input(s): LABPT, INR in the last 72 hours.   Assessment/Plan: Day of Surgery Procedure(s) (LRB): LEFT C6-7 FORAMINOTOMY WITH EXCISION OF HERNIATED NUCLEUS PULPOSUS (N/A)  Advance diet Up with therapy Plan for discharge tomorrow  Lisa Newton 03/14/2017, 3:06 PM

## 2017-03-14 NOTE — H&P (Signed)
Lisa Newton is an 46 y.o. female.   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  Progressively worsening symptoms.  Failed conservative treatment.    Past Medical History:  Diagnosis Date  . Arthritis   . Depression 01/11/2012  . Hypertension   . Left ear hearing loss     Past Surgical History:  Procedure Laterality Date  . left ear surgery     ruptured TM    Family History  Problem Relation Age of Onset  . Cancer Mother     lung cancer  . Diabetes Father   . Hypertension Father    Social History:  reports that she has never smoked. She has never used smokeless tobacco. She reports that she does not drink alcohol or use drugs.  Allergies:  Allergies  Allergen Reactions  . Tape Rash  . Augmentin [Amoxicillin-Pot Clavulanate] Hives    Took Bactrim and Augmentin together and broke out in hives.Marland KitchenMarland KitchenPt says penicillin/amoxicillin previously without any problems.the patient is unable to answer any penicillin questions as it is unknown exactly what medication broke her out in hives.  . Bactrim [Sulfamethoxazole-Trimethoprim] Hives    Took Bactrim and Augmentin together and broke out in hives.Marland KitchenMarland KitchenPt says she took bactrim previously without any problems, she did state that she had just been released after a course of vancomycin iv and didn't know if that hadn't affected the bactrim.   . Latex Hives  . Pollen Extract     Facility-Administered Medications Prior to Admission  Medication Dose Route Frequency Provider Last Rate Last Dose  . lidocaine (PF) (XYLOCAINE) 1 % injection 0.3 mL  0.3 mL Other Once Tyrell Antonio, MD       Medications Prior to Admission  Medication Sig Dispense Refill  . atorvastatin (LIPITOR) 20 MG tablet Take 1 tablet (20 mg total) by mouth daily. (Patient taking differently: Take 20 mg by mouth 2 (two) times a week. ) 90 tablet 3  .  Biotin 1000 MCG tablet Take 1,000 mcg by mouth daily.    . cyclobenzaprine (FLEXERIL) 5 MG tablet Take 1 tablet (5 mg total) by mouth 2 (two) times daily as needed for muscle spasms. (Patient taking differently: Take 5 mg by mouth 3 (three) times daily. ) 30 tablet 1  . diclofenac (VOLTAREN) 75 MG EC tablet Take 1 tablet (75 mg total) by mouth 2 (two) times daily as needed. (Patient taking differently: Take 75 mg by mouth 2 (two) times daily. ) 30 tablet 2  . gabapentin (NEURONTIN) 300 MG capsule Take 1 capsule (300 mg total) by mouth 3 (three) times daily. (Patient taking differently: Take 300 mg by mouth See admin instructions. Take 1 capsule (300 mg) by mouth 2-3 times daily) 90 capsule 1  . hydrochlorothiazide (MICROZIDE) 12.5 MG capsule Take 1 capsule (12.5 mg total) by mouth daily. Hold if BP is consistently less than 130/80 (Patient taking differently: Take 12.5 mg by mouth daily as needed. Hold if BP is consistently less than 130/80) 30 capsule 1  . HYDROcodone-acetaminophen (NORCO/VICODIN) 5-325 MG tablet Take 1 tablet by mouth every 6 (six) hours as needed for moderate pain. 30 tablet 0  . hydrOXYzine (VISTARIL) 50 MG capsule 1 PO 1 hour prior to procedure 2 capsule 0  . ibuprofen (ADVIL,MOTRIN) 400 MG tablet Take 1 tablet (400 mg total) by mouth every 6 (six) hours as needed. (Patient not taking: Reported  on 03/02/2017) 30 tablet 0  . oxyCODONE-acetaminophen (PERCOCET/ROXICET) 5-325 MG tablet Take 1 tablet by mouth every 6 (six) hours as needed for severe pain. 40 tablet 0  . sertraline (ZOLOFT) 50 MG tablet TAKE ONE TABLET BY MOUTH ONCE DAILY (Patient not taking: Reported on 03/02/2017) 30 tablet 3    No results found for this or any previous visit (from the past 48 hour(s)). No results found.  Review of Systems  Constitutional: Negative.   HENT: Negative.   Respiratory: Negative.   Cardiovascular: Negative.   Gastrointestinal: Negative.   Genitourinary: Negative.   Musculoskeletal:  Positive for neck pain.  Skin: Negative.   Neurological: Positive for tingling.  Psychiatric/Behavioral: Negative.     Blood pressure (!) 136/95, pulse 85, temperature 98.3 F (36.8 C), temperature source Oral, resp. rate 20, height 5' 5.5" (1.664 m), weight 201 lb (91.2 kg), last menstrual period 03/14/2017, SpO2 100 %. Physical Exam  Constitutional: She is oriented to person, place, and time. No distress.  HENT:  Head: Normocephalic and atraumatic.  Eyes: EOM are normal. Pupils are equal, round, and reactive to light.  Neck:  Limited ROM.  Brachial plexus tenderness.   Respiratory: No respiratory distress.  GI: She exhibits no distension.  Neurological: She is alert and oriented to person, place, and time.  Skin: Skin is warm and dry.  Psychiatric: She has a normal mood and affect.     Study Result   CLINICAL DATA:  46 year old female with left neck and shoulder/arm pain for 3 weeks. No known injury. Subsequent encounter.  EXAM: MRI CERVICAL SPINE WITHOUT CONTRAST  TECHNIQUE: Multiplanar, multisequence MR imaging of the cervical spine was performed. No intravenous contrast was administered.  COMPARISON:  12/02/2016 plain film exam of the cervical spine. Temporal bone CT 01/14/2015. No comparison cervical spine MR.  FINDINGS: Alignment: Reversal of the normal cervical lordosis with mild curvature.  Vertebrae: Slightly heterogeneous distribution and bone marrow without destructive lesion.  Cord: No focal cervical cord signal abnormality.  Posterior Fossa, vertebral arteries, paraspinal tissues: Cerebellar tonsils slightly low lying within the range normal limits. Prominence soft tissue posterosuperior nasopharynx unchanged.  Disc levels:  C2-3:  Negative.  C3-4:  Negative.  C4-5: Broad-based disc osteophyte complex. Narrowing ventral thecal sac and minimal cord flattening. Uncinate hypertrophy with mild right-sided and mild to moderate left-sided  foraminal narrowing.  C5-6: Broad-based disc osteophyte complex. Mild narrowing ventral thecal sac. Uncinate hypertrophy with mild bilateral foraminal narrowing.  C6-7: Small to moderate-size focal left paracentral/posterior lateral protrusion with flattening of the left aspect of cord and narrowing of the origin left neural foramen.  C7-T1:  Negative  IMPRESSION: Summary of pertinent findings includes:  C6-7 small to moderate-size focal left paracentral/ posterior lateral protrusion with flattening of the left aspect of cord and narrowing of the origin left neural foramen.  C4-5 broad-based disc osteophyte complex. Narrowing ventral thecal sac and minimal cord flattening. Mild right-sided and mild to moderate left-sided foraminal narrowing.  C5-6 broad-based disc osteophyte complex. Mild narrowing ventral thecal sac. Mild bilateral foraminal narrowing.   Electronically Signed   By: Lacy Duverney M.D.   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  Surgical procedure along with possible risks and complications discussed.  All questions answered and wishes to proceed.   Zonia Kief, PA-C 03/14/2017, 6:45 AM

## 2017-03-14 NOTE — Transfer of Care (Signed)
Immediate Anesthesia Transfer of Care Note  Patient: Lisa Newton  Procedure(s) Performed: Procedure(s): LEFT C6-7 FORAMINOTOMY WITH EXCISION OF HERNIATED NUCLEUS PULPOSUS (N/A)  Patient Location: PACU  Anesthesia Type:General  Level of Consciousness: awake, alert  and oriented  Airway & Oxygen Therapy: Patient Spontanous Breathing  Post-op Assessment: Report given to RN and Post -op Vital signs reviewed and stable  Post vital signs: Reviewed and stable  Last Vitals:  Vitals:   03/14/17 0548  BP: (!) 136/95  Pulse: 85  Resp: 20  Temp: 36.8 C    Last Pain:  Vitals:   03/14/17 0548  TempSrc: Oral         Complications: No apparent anesthesia complications

## 2017-03-14 NOTE — Anesthesia Procedure Notes (Signed)
Procedure Name: Intubation Date/Time: 03/14/2017 7:56 AM Performed by: Merdis Delay Pre-anesthesia Checklist: Patient identified, Emergency Drugs available, Suction available, Patient being monitored and Timeout performed Patient Re-evaluated:Patient Re-evaluated prior to inductionOxygen Delivery Method: Circle system utilized Preoxygenation: Pre-oxygenation with 100% oxygen Intubation Type: IV induction Ventilation: Mask ventilation without difficulty Laryngoscope Size: Mac and 3 Grade View: Grade II Tube type: Oral Tube size: 7.5 mm Number of attempts: 1 Placement Confirmation: ETT inserted through vocal cords under direct vision,  positive ETCO2 and breath sounds checked- equal and bilateral Secured at: 22 cm Tube secured with: Tape Dental Injury: Teeth and Oropharynx as per pre-operative assessment

## 2017-03-14 NOTE — Evaluation (Signed)
Physical Therapy Evaluation Patient Details Name: Lisa Newton MRN: [id number] DOB: 04-06-1971 Today's Date: 03/14/2017   History of Present Illness  Pt is a 46 yo female s/p LEFT C6-7 FORAMINOTOMY WITH EXCISION OF HERNIATED NUCLEUS PULPOSUS on 4/3. PMH includes arthritis, depression, HTN and L hearing loss.  Clinical Impression  Patient is s/p above surgery resulting in the deficits listed below (see PT Problem List). Pt tolerated OOB mobility well for first time. Patient will benefit from skilled PT to increase their independence and safety with mobility (while adhering to their precautions) to allow discharge to the venue listed below.     Follow Up Recommendations No PT follow up;Supervision/Assistance - 24 hour    Equipment Recommendations  None recommended by PT    Recommendations for Other Services       Precautions / Restrictions Precautions Precautions: Cervical Precaution Comments: handout provided, pt with verbal understanding Required Braces or Orthoses: Cervical Brace Cervical Brace: Soft collar Restrictions Weight Bearing Restrictions: No      Mobility  Bed Mobility Overal bed mobility: Needs Assistance Bed Mobility: Rolling;Sidelying to Sit;Sit to Sidelying Rolling: Supervision Sidelying to sit: Supervision     Sit to sidelying: Supervision General bed mobility comments: v/c's for sequencing  Transfers Overall transfer level: Needs assistance Equipment used: None Transfers: Sit to/from Stand Sit to Stand: Min guard         General transfer comment: v/c's to push up from bed and minimize bending  Ambulation/Gait Ambulation/Gait assistance: Min guard Ambulation Distance (Feet): 120 Feet Assistive device: 1 person hand held assist Gait Pattern/deviations: Step-through pattern;Decreased stride length Gait velocity: slow Gait velocity interpretation: Below normal speed for age/gender General Gait Details: slow, guarded, v/c's to relax shoulders,  given L HHA to help steady pt and allow her to relax shoulders  Stairs Stairs: Yes Stairs assistance: Min assist Stair Management: One rail Right;Step to pattern Number of Stairs: 4 General stair comments: slow, v/c's for step to, minA to steady pt  Wheelchair Mobility    Modified Rankin (Stroke Patients Only)       Balance Overall balance assessment: No apparent balance deficits (not formally assessed)                                           Pertinent Vitals/Pain Pain Assessment: 0-10 Pain Score: 8  Pain Location: surgical site Pain Descriptors / Indicators: Sore Pain Intervention(s): Monitored during session    Home Living Family/patient expects to be discharged to:: Private residence Living Arrangements:  (sister, daughter and grandchildren) Available Help at Discharge: Family;Available 24 hours/day Type of Home: House Home Access: Stairs to enter Entrance Stairs-Rails: Right Entrance Stairs-Number of Steps: 3 Home Layout: Two level Home Equipment: None      Prior Function Level of Independence: Independent               Hand Dominance   Dominant Hand: Right    Extremity/Trunk Assessment   Upper Extremity Assessment Upper Extremity Assessment: Overall WFL for tasks assessed (ROM at shld limited due to cervical precautions)    Lower Extremity Assessment Lower Extremity Assessment: Overall WFL for tasks assessed    Cervical / Trunk Assessment Cervical / Trunk Assessment: Other exceptions (cervical surgery)  Communication   Communication: No difficulties  Cognition Arousal/Alertness: Awake/alert Behavior During Therapy: WFL for tasks assessed/performed Overall Cognitive Status: Within Functional Limits for tasks assessed  General Comments General comments (skin integrity, edema, etc.): surgical site with dressing    Exercises     Assessment/Plan    PT Assessment  Patient needs continued PT services  PT Problem List Decreased activity tolerance;Decreased balance;Decreased mobility       PT Treatment Interventions DME instruction;Gait training;Stair training;Functional mobility training;Therapeutic activities;Therapeutic exercise;Balance training    PT Goals (Current goals can be found in the Care Plan section)  Acute Rehab PT Goals Patient Stated Goal: home asap PT Goal Formulation: With patient Time For Goal Achievement: 03/21/17 Potential to Achieve Goals: Good    Frequency Min 5X/week   Barriers to discharge        Co-evaluation               End of Session Equipment Utilized During Treatment: Gait belt;Cervical collar Activity Tolerance: Patient tolerated treatment well Patient left: in bed;with call bell/phone within reach;with family/visitor present Nurse Communication: Mobility status PT Visit Diagnosis: Difficulty in walking, not elsewhere classified (R26.2)    Time: 8119-1478 PT Time Calculation (min) (ACUTE ONLY): 13 min   Charges:   PT Evaluation $PT Eval Low Complexity: 1 Procedure     PT G Codes:   PT G-Codes **NOT FOR INPATIENT CLASS** Functional Assessment Tool Used: Clinical judgement Functional Limitation: Mobility: Walking and moving around Mobility: Walking and Moving Around Current Status (G9562): At least 1 percent but less than 20 percent impaired, limited or restricted Mobility: Walking and Moving Around Goal Status [phone]): At least 1 percent but less than 20 percent impaired, limited or restricted    Lewis Shock, PT, DPT Pager #: [phone] Office #: [phone]   Rozell Searing Rushi Chasen 03/14/2017, 12:22 PM

## 2017-03-14 NOTE — Progress Notes (Signed)
Pharmacy Antibiotic Note  Lisa Newton is a 46 y.o. female admitted on 03/14/2017 for a planned spinal surgery. Pharmacy has been consulted for Vancomycin dosing post-op. Per RN report - no drain is in place.   The patient received Vancomycin 1g x 1 earlier today around 0800  Plan: 1. Vancomycin 1g IV x 1 dose post-op at 2000 2. Pharmacy will sign off as no further doses expected at this time  Height: 5' 5.5" (166.4 cm) Weight: 201 lb (91.2 kg) IBW/kg (Calculated) : 58.15  Temp (24hrs), Avg:97.8 F (36.6 C), Min:97.3 F (36.3 C), Max:98.3 F (36.8 C)   Recent Labs Lab 03/08/17 1241  WBC 8.1  CREATININE 0.77    Estimated Creatinine Clearance: 100.1 mL/min (by C-G formula based on SCr of 0.77 mg/dL).    Allergies  Allergen Reactions  . Tape Rash  . Augmentin [Amoxicillin-Pot Clavulanate] Hives    Took Bactrim and Augmentin together and broke out in hives.Marland KitchenMarland KitchenPt says penicillin/amoxicillin previously without any problems.the patient is unable to answer any penicillin questions as it is unknown exactly what medication broke her out in hives.  . Bactrim [Sulfamethoxazole-Trimethoprim] Hives    Took Bactrim and Augmentin together and broke out in hives.Marland KitchenMarland KitchenPt says she took bactrim previously without any problems, she did state that she had just been released after a course of vancomycin iv and didn't know if that hadn't affected the bactrim.   . Latex Hives  . Pollen Extract     Thank you for allowing pharmacy to be a part of this patient's care.  Rolley Sims 03/14/2017 11:53 AM

## 2017-03-14 NOTE — Interval H&P Note (Signed)
History and Physical Interval Note:  03/14/2017 7:23 AM  Lisa Newton  has presented today for surgery, with the diagnosis of left C6-7 herniated disc  The various methods of treatment have been discussed with the patient and family. After consideration of risks, benefits and other options for treatment, the patient has consented to  Procedure(s): LEFT C6-7 FORAMINOTOMY WITH EXCISION OF HERNIATED NUCLEUS PULPOSUS (N/A) as a surgical intervention .  The patient's history has been reviewed, patient examined, no change in status, stable for surgery.  I have reviewed the patient's chart and labs.  Questions were answered to the patient's satisfaction.     Kerrin Champagne

## 2017-03-14 NOTE — Anesthesia Postprocedure Evaluation (Signed)
Anesthesia Post Note  Patient: Lisa Newton  Procedure(s) Performed: Procedure(s) (LRB): LEFT C6-7 FORAMINOTOMY WITH EXCISION OF HERNIATED NUCLEUS PULPOSUS (N/A)  Patient location during evaluation: PACU Anesthesia Type: General Level of consciousness: awake and alert Pain management: pain level controlled Vital Signs Assessment: post-procedure vital signs reviewed and stable Respiratory status: spontaneous breathing Cardiovascular status: stable Postop Assessment: no signs of nausea or vomiting Anesthetic complications: no       Last Vitals:  Vitals:   03/14/17 0548 03/14/17 1010  BP: (!) 136/95 106/68  Pulse: 85 91  Resp: 20 15  Temp: 36.8 C 36.6 C    Last Pain:  Vitals:   03/14/17 1016  TempSrc:   PainSc: 6     LLE Motor Response: Purposeful movement;Responds to commands (03/14/17 1010) LLE Sensation: Full sensation (03/14/17 1010) RLE Motor Response: Purposeful movement;Responds to commands (03/14/17 1010) RLE Sensation: Full sensation (03/14/17 1010)      Arma Heading

## 2017-03-14 NOTE — Discharge Instructions (Signed)
No lifting greater than 10 lbs. °Avoid bending, stooping and twisting. °Walking in house for first week then may start to get out slowly increasing activity using arms. °Keep incision dry for 3 days, may use tegaderm or similar water impervious dressing. °Avoid overhead use of arms and overhead lifting. °Wear collar for comfort. °Use ice as needed for comfort. °

## 2017-03-14 NOTE — Op Note (Signed)
03/14/2017  9:56 AM  PATIENT:  Lisa Newton  46 y.o. female  MRN: [id number]  OPERATIVE REPORT  PRE-OPERATIVE DIAGNOSIS:  left C6-7 herniated disc  POST-OPERATIVE DIAGNOSIS:  left C6-7 herniated disc  PROCEDURE:  Procedure(s): LEFT C6-7 FORAMINOTOMY WITH EXCISION OF HERNIATED NUCLEUS PULPOSUS    SURGEON:  Jessy Oto, MD     ASSISTANT:  Benjiman Core, PA-C  (Present throughout the entire procedure and necessary for completion of procedure in a timely manner)     ANESTHESIA:  General,    COMPLICATIONS:  None.  \ PROCEDURE:The patient was met in the holding area, and the appropriate left C6-7 cervical level identified and marked with "x" and my initials. All questions were answered and informed consent signed.   The patient was then transported to OR. The patient was then placed under general anesthesia without difficulty. A foley catheter was placed sterilely by OR nursing personnel. and transferred to the operating room table prone position Mayfield horseshoe with Oralia Manis. All pressure points well-padded PAS stockings.Shoulders taped down and skin over the posterior inferior aspect of the neck place in traction to decrease skin folds. The patient received appropriate preoperative antibiotic vancomycin prophylaxis.Time-out procedure was called and correct.  Sterile prep with DuraPrep and draped in the usual manner the shoulders were taped downwards and skin traction over the skin of the neck. Following DuraPrep draped in the usual manner. After timeout protocol incision was made approximately C6-7 in the midline. This following infiltration of skin and subcutaneous layers with marcaine 0.5% 1:1 exparel 1.3% total of 10 cc. Incision carried through skin and subcutaneous layers using 10 blade scalpel and electrocautery down to the level ligamentum nuchae. Incision made along the left lateral aspect of the spinous process of C6. Clamp then placed at the interspinous process space of C6-7.  Intraoperative C-arm fluoroscopy identified the clamp at the C6-7 interspinous process level. Then a marking pen was used to mark the spinous process of C7. A single 0 vicryl suture also used to tag the spinous process of C7. Electrocautery then used to carefully incise the cervical muscles off the left lateral aspect of the spinous process of C6-7. Dividing the  spinous muscles off of the inferior aspect lamina at C6 and C7 exposing the C6-7 posterior aspect of the interlaminar space. The magnification headlamp were used during this portion procedure. Boss McCollough retractor was inserted. High-speed bur was used to remove a small portion of bone from the inferior aspect of lamina of C6 and the medial 20% of the inferior articular process of C6. Further thinning the superior aspect of the lamina left C7. A 1 mm Kerrison was then used to remove him from superior aspect of the lamina C7 and the medial aspect of the intra-articular process of C6 the 20%, exposing the superior articular process of C7. A 1 mm Kerrison was removed bone off the medial aspect of the superior articular process of C7 resecting 20% of the medial aspect of the superior articular process of C7. Ligamentum flavum then easily lifted superiorly  electrocautery unit cauterizing epidural veins deep to the ligamentum flavum  then resecting the ligamentum flavum. The operating room microscope was draped sterilely and brought into the field. Under the operating room microscope the epidural vein layer overlying the posterior aspect of the thecal sac and the C7 nerve root was then carefully lifted using a micro-titanium nerve hook then cauterized using bipolar electrocautery a # 15 blade scalpel then used to incise this overlying the  C7 nerve root releasing the vascular leash a backward angle 3-0 microcurette then used to remove a small portion of bone off the superior and medial aspect of the pedicle further mobilizing the C7 nerve root bipolar  electrocautery to control all bleeding within the axillary area of the left C7 nerve. Bone wax was applied to bleeding cancellus bone surfaces are excellent hemostasis obtained  The disc explored using a Penfield 4 found to be protruded. Retracting the C7 nerve root with a nerve hook the left posterior disc at C6-7 was incised with an #11 blade scalpel. A single moderate sized fragment of disc was excised with a micropituitary ronguer.  The uncovertebral joint found to be prominent resulting in left C7 foramenal narrowing. Following this then hemostasis was obtained using thrombin-soaked Gelfoam and micro-pledgettes. When complete hemostasis was obtained all gel foam was removed the nerve hook could be easily passed out the neuroforamen out over the lateral aspect of the C7 pedicle demonstrate the C7 neuroforamen completely decompressed. Irrigation was carried out no active bleeding was present. The incision was closed by approximating the ligamentum nuchae with #1 ethibond sutures. The subcutaneous layers approximated with interrupted 0 Vicryl suture more superficial layers with interrupted 2-0 Vicryl sutures and the skin closed with interrupted 4-0 Vicryl sutures. Dermabond was applied then [employer] bandage. Soft cervical collar placed.  All instrument and sponge counts were correct. Patient was then returned to supine position on her stretcher. Returned to recovery room in satisfactory condition.   Physician assistant's responsibilities: Benjiman Core, PA-C perform the duties of assistant physician and surgeon during this case present from the beginning of the case to the end of the case. He assisted with careful retraction of neural structures suctioning about her elements including cervical cord and C7 nerve root. Performed closure of the incision on the ligamentum nuchae to the skin and application of dressing. He assisted in positioning the patient had removal the patient from the OR table to the stretcher.   Jessy Oto 03/14/2017, 9:56 AM

## 2017-03-15 ENCOUNTER — Telehealth: Payer: Self-pay | Admitting: Family Medicine

## 2017-03-15 DIAGNOSIS — M50123 Cervical disc disorder at C6-C7 level with radiculopathy: Secondary | ICD-10-CM | POA: Diagnosis not present

## 2017-03-15 MED FILL — Thrombin For Soln 20000 Unit: CUTANEOUS | Qty: 1 | Status: AC

## 2017-03-15 NOTE — Progress Notes (Signed)
Subjective: Doing well.  Pain controlled.  preop left arm pain greatly improved.  Ready to go home.  Objective: Vital signs in last 24 hours: Temp:  [97.3 F (36.3 C)-98.5 F (36.9 C)] 98.4 F (36.9 C) (04/04 0807) Pulse Rate:  [72-93] 86 (04/04 0807) Resp:  [10-18] 18 (04/04 0807) BP: (106-178)/(52-95) 141/81 (04/04 0807) SpO2:  [96 %-100 %] 100 % (04/04 0807)  Intake/Output from previous day: 04/03 0701 - 04/04 0700 In: 1440 [P.O.:240; I.V.:1200] Out: 250 [Blood:250] Intake/Output this shift: No intake/output data recorded.  No results for input(s): HGB in the last 72 hours. No results for input(s): WBC, RBC, HCT, PLT in the last 72 hours. No results for input(s): NA, K, CL, CO2, BUN, CREATININE, GLUCOSE, CALCIUM in the last 72 hours. No results for input(s): LABPT, INR in the last 72 hours.  Exam: Pleasant BF alert and oriented.  NAD.  Wound looks good.  steris intact.   4x4 and tegaderm applied.  NVI.  No focal motor deficits.    Assessment/Plan: D/c home today.  Script for percocet on chart.  f/u in office as scheduled.     Lisa Newton 03/15/2017, 8:55 AM

## 2017-03-15 NOTE — Telephone Encounter (Signed)
Will forward to MD. Jazmin Hartsell,CMA  

## 2017-03-15 NOTE — Discharge Summary (Signed)
Patient ID: Lisa Newton MRN: [id number] DOB/AGE: 1971/11/10 46 y.o.  Admit date: 03/14/2017 Discharge date: 03/15/2017  Admission Diagnoses:  Principal Problem:   Herniation of cervical intervertebral disc with radiculopathy Active Problems:   Herniated disc, cervical   Discharge Diagnoses:  Principal Problem:   Herniation of cervical intervertebral disc with radiculopathy Active Problems:   Herniated disc, cervical  status post Procedure(s): LEFT C6-7 FORAMINOTOMY WITH EXCISION OF HERNIATED NUCLEUS PULPOSUS  Past Medical History:  Diagnosis Date  . Arthritis   . Depression 01/11/2012  . Hypertension   . Left ear hearing loss     Surgeries: Procedure(s): LEFT C6-7 FORAMINOTOMY WITH EXCISION OF HERNIATED NUCLEUS PULPOSUS on 03/14/2017   Consultants:   Discharged Condition: Improved  Hospital Course: Lisa Newton is an 46 y.o. female who was admitted 03/14/2017 for operative treatment of Herniation of cervical intervertebral disc with radiculopathy. Patient failed conservative treatments (please see the history and physical for the specifics) and had severe unremitting pain that affects sleep, daily activities and work/hobbies. After pre-op clearance, the patient was taken to the operating room on 03/14/2017 and underwent  Procedure(s): LEFT C6-7 FORAMINOTOMY WITH EXCISION OF HERNIATED NUCLEUS PULPOSUS.    Patient was given perioperative antibiotics: Anti-infectives    Start     Dose/Rate Route Frequency Ordered Stop   03/14/17 2000  vancomycin (VANCOCIN) IVPB 1000 mg/200 mL premix     1,000 mg 200 mL/hr over 60 Minutes Intravenous  Once 03/14/17 1155 03/14/17 2049   03/14/17 0541  vancomycin (VANCOCIN) IVPB 1000 mg/200 mL premix     1,000 mg 200 mL/hr over 60 Minutes Intravenous On call to O.R. 03/14/17 0541 03/14/17 0845   03/14/17 0538  vancomycin (VANCOCIN) 1-5 GM/200ML-% IVPB    Comments:  Marrianne Mood   : cabinet override      03/14/17 0538 03/14/17 0745        Patient was given sequential compression devices and early ambulation to prevent DVT.   Patient benefited maximally from hospital stay and there were no complications. At the time of discharge, the patient was urinating/moving their bowels without difficulty, tolerating a regular diet, pain is controlled with oral pain medications and they have been cleared by PT/OT.   Recent vital signs: Patient Vitals for the past 24 hrs:  BP Temp Temp src Pulse Resp SpO2  03/15/17 0807 (!) 141/81 98.4 F (36.9 C) - 86 18 100 %  03/15/17 0351 (!) 143/83 98.2 F (36.8 C) Oral 72 18 100 %  03/14/17 2317 (!) 178/95 98.4 F (36.9 C) Oral 91 18 100 %  03/14/17 1956 (!) 153/88 98.5 F (36.9 C) Oral 87 18 97 %  03/14/17 1704 (!) 115/52 98.5 F (36.9 C) Oral 92 18 100 %  03/14/17 1123 129/73 97.3 F (36.3 C) - 93 14 99 %  03/14/17 1110 122/85 - - 88 12 96 %  03/14/17 1055 119/79 - - 87 10 98 %  03/14/17 1040 117/76 - - 88 12 98 %  03/14/17 1025 108/69 - - 88 12 99 %  03/14/17 1010 106/68 97.8 F (36.6 C) - 91 15 97 %     Recent laboratory studies: No results for input(s): WBC, HGB, HCT, PLT, NA, K, CL, CO2, BUN, CREATININE, GLUCOSE, INR, CALCIUM in the last 72 hours.  Invalid input(s): PT, 2   Discharge Medications:   Allergies as of 03/15/2017      Reactions   Tape Rash   Augmentin [amoxicillin-pot Clavulanate] Hives   Took  Bactrim and Augmentin together and broke out in hives.Marland KitchenMarland KitchenPt says penicillin/amoxicillin previously without any problems.the patient is unable to answer any penicillin questions as it is unknown exactly what medication broke her out in hives.   Bactrim [sulfamethoxazole-trimethoprim] Hives   Took Bactrim and Augmentin together and broke out in hives.Marland KitchenMarland KitchenPt says she took bactrim previously without any problems, she did state that she had just been released after a course of vancomycin iv and didn't know if that hadn't affected the bactrim.    Latex Hives   Pollen Extract        Medication List    STOP taking these medications   ibuprofen 400 MG tablet Commonly known as:  ADVIL,MOTRIN     TAKE these medications   atorvastatin 20 MG tablet Commonly known as:  LIPITOR Take 1 tablet (20 mg total) by mouth daily. What changed:  when to take this   Biotin 1000 MCG tablet Take 1,000 mcg by mouth daily.   cyclobenzaprine 5 MG tablet Commonly known as:  FLEXERIL Take 1 tablet (5 mg total) by mouth 2 (two) times daily as needed for muscle spasms. What changed:  when to take this   diclofenac 75 MG EC tablet Commonly known as:  VOLTAREN Take 1 tablet (75 mg total) by mouth 2 (two) times daily as needed. What changed:  when to take this   gabapentin 300 MG capsule Commonly known as:  NEURONTIN Take 1 capsule (300 mg total) by mouth 3 (three) times daily. What changed:  when to take this  additional instructions   hydrochlorothiazide 12.5 MG capsule Commonly known as:  MICROZIDE Take 1 capsule (12.5 mg total) by mouth daily. Hold if BP is consistently less than 130/80 What changed:  when to take this  reasons to take this  additional instructions   HYDROcodone-acetaminophen 5-325 MG tablet Commonly known as:  NORCO/VICODIN Take 1 tablet by mouth every 6 (six) hours as needed for moderate pain.   hydrOXYzine 50 MG capsule Commonly known as:  VISTARIL 1 PO 1 hour prior to procedure   oxyCODONE-acetaminophen 5-325 MG tablet Commonly known as:  PERCOCET/ROXICET Take 1 tablet by mouth every 6 (six) hours as needed for severe pain.   sertraline 50 MG tablet Commonly known as:  ZOLOFT TAKE ONE TABLET BY MOUTH ONCE DAILY       Diagnostic Studies: Dg Cervical Spine 1 View  Result Date: 03/14/2017 CLINICAL DATA:  Left C6-7 for MN not a mini. EXAM: CERVICAL SPINE 1 VIEW COMPARISON:  MRI 12/19/2016 FINDINGS: Initial films shows a clamp at the level of the superior C7 spinous process. Second film is less well penetrated and location is not clear.  IMPRESSION: Superior aspect C7 spinous process localized on the initial image. This was called to the operating room and conveyed to Dr. Otelia Sergeant. Electronically Signed   By: Paulina Fusi M.D.   On: 03/14/2017 08:48    Discharge Instructions    Call MD / Call 911    Complete by:  As directed    If you experience chest pain or shortness of breath, CALL 911 and be transported to the hospital emergency room.  If you develope a fever above 101 F, pus (white drainage) or increased drainage or redness at the wound, or calf pain, call your surgeon's office.   Constipation Prevention    Complete by:  As directed    Drink plenty of fluids.  Prune juice may be helpful.  You may use a stool softener, such as  Colace (over the counter) 100 mg twice a day.  Use MiraLax (over the counter) for constipation as needed.   Diet - low sodium heart healthy    Complete by:  As directed    Discharge instructions    Complete by:  As directed    No lifting greater than 10 lbs. Avoid bending, stooping and twisting. Walking in house for first week then may start to get out slowly increasing activity using arms. Keep incision dry for 3 days, may use tegaderm or similar water impervious dressing. Avoid overhead use of arms and overhead lifting. Wear collar for comfort. Use ice as needed for comfort.   Driving restrictions    Complete by:  As directed    No driving for 2 weeks   Increase activity slowly as tolerated    Complete by:  As directed    Lifting restrictions    Complete by:  As directed    No lifting for 6 weeks      Follow-up Information    Kerrin Champagne, MD Follow up in 2 week(s).   Specialty:  Orthopedic Surgery Why:  For wound re-check Contact information: [address] [place] [address] [phone]           Discharge Plan:  discharge to home  Disposition:     Signed: Zonia Kief for Dr Vira Browns 03/15/2017, 8:59 AM

## 2017-03-15 NOTE — Progress Notes (Signed)
Physical Therapy Treatment Patient Details Name: Lisa Newton MRN: [id number] DOB: 04-16-1971 Today's Date: 03/15/2017    History of Present Illness Pt is a 46 yo female s/p LEFT C6-7 FORAMINOTOMY WITH EXCISION OF HERNIATED NUCLEUS PULPOSUS on 4/3. PMH includes arthritis, depression, HTN and L hearing loss.    PT Comments    Pt is progressing well toward PT goals.  She is min guard for ambulation and supervision for transfers. She was reeducated on precautions and safe functional movements. She is recalling and adhering to precautions consistently.  Follow Up Recommendations  No PT follow up;Supervision/Assistance - 24 hour     Equipment Recommendations  Other (comment) (tub bench)    Recommendations for Other Services       Precautions / Restrictions Precautions Precautions: Cervical Precaution Comments: Pt did not remember receiving a precautions packet until it was shown to her.  Reeducated on packet and precautions. Required Braces or Orthoses: Cervical Brace Cervical Brace: Soft collar Restrictions Weight Bearing Restrictions: No    Mobility  Bed Mobility Overal bed mobility: Needs Assistance Bed Mobility: Rolling;Sidelying to Sit Rolling: Supervision Sidelying to sit: Supervision     Sit to sidelying: Supervision General bed mobility comments: VC's for sequencing and precaution maintenance.  Transfers Overall transfer level: Needs assistance Equipment used: None Transfers: Sit to/from Stand Sit to Stand: Supervision         General transfer comment: VC's for hand placement stand > sit  and precaution management (not to bend or twist) with sit > stand.  Ambulation/Gait Ambulation/Gait assistance: Min guard Ambulation Distance (Feet): 200 Feet Assistive device: None Gait Pattern/deviations: Step-through pattern;Decreased stride length Gait velocity: decreased Gait velocity interpretation: Below normal speed for age/gender General Gait Details: Slow.  Cued for natural arm swing.   Stairs Stairs: Yes   Stair Management: One rail Right;Step to pattern Number of Stairs: 11 General stair comments: slow, VC's for sequencing up with the good, down with the bad.  Wheelchair Mobility    Modified Rankin (Stroke Patients Only)       Balance Overall balance assessment: No apparent balance deficits (not formally assessed)                                          Cognition Arousal/Alertness: Awake/alert Behavior During Therapy: WFL for tasks assessed/performed Overall Cognitive Status: Within Functional Limits for tasks assessed                                        Exercises      General Comments General comments (skin integrity, edema, etc.): Pt eager to follow all directions to ensure optimum recovery.  Daughter was present and expressed concern about pt decreasing her workload and physical duties at home: lifting, chores, etc.      Pertinent Vitals/Pain Pain Assessment: 0-10 Pain Score: 8  Pain Location: surgical site Pain Descriptors / Indicators: Operative site guarding Pain Intervention(s): Monitored during session;Repositioned    Home Living Family/patient expects to be discharged to:: Private residence Living Arrangements: Children Available Help at Discharge: Family;Available 24 hours/day Type of Home: House Home Access: Stairs to enter Entrance Stairs-Rails: Right Home Layout: Two level Home Equipment: None      Prior Function Level of Independence: Independent          PT Goals (  current goals can now be found in the care plan section) Acute Rehab PT Goals Patient Stated Goal: to get back to working and making an income PT Goal Formulation: With patient Time For Goal Achievement: 03/22/17 Potential to Achieve Goals: Good Progress towards PT goals: Progressing toward goals    Frequency    Min 5X/week      PT Plan Current plan remains appropriate     Co-evaluation             End of Session Equipment Utilized During Treatment: Cervical collar Activity Tolerance: Patient tolerated treatment well Patient left: in chair;with call bell/phone within reach;with family/visitor present (daughter present ) Nurse Communication: Mobility status PT Visit Diagnosis: Difficulty in walking, not elsewhere classified (R26.2);Pain Pain - Right/Left: Left Pain - part of body: Shoulder     Time: 7829-5621 PT Time Calculation (min) (ACUTE ONLY): 18 min  Charges:  $Gait Training: 8-22 mins                    G Codes:       Willaim Rayas SPT   Willaim Rayas 03/15/2017, 8:59 AM

## 2017-03-15 NOTE — Telephone Encounter (Signed)
Pt is calling and would like to start taking Sertraline again or something that might be a little stronger. She was just released from the hospital and she is not resting at all. jw

## 2017-03-15 NOTE — Evaluation (Signed)
Occupational Therapy Evaluation Patient Details Name: Lisa Newton MRN: [id number] DOB: 23-Jun-1971 Today's Date: 03/15/2017    History of Present Illness Pt is a 46 yo female s/p LEFT C6-7 FORAMINOTOMY WITH EXCISION OF HERNIATED NUCLEUS PULPOSUS on 4/3. PMH includes arthritis, depression, HTN and L hearing loss.   Clinical Impression   Patient evaluated by Occupational Therapy with no further acute OT needs identified. All education has been completed and the patient has no further questions. See below for any follow-up Occupational Therapy or equipment needs. OT to sign off. Thank you for referral.      Follow Up Recommendations  No OT follow up    Equipment Recommendations  None recommended by OT    Recommendations for Other Services       Precautions / Restrictions Precautions Precautions: Cervical Precaution Comments: handout present and reviewd for adls Required Braces or Orthoses: Cervical Brace Cervical Brace: Soft collar      Mobility Bed Mobility               General bed mobility comments: oob in chair   Transfers Overall transfer level: Needs assistance   Transfers: Sit to/from Stand Sit to Stand: Modified independent (Device/Increase time)              Balance Overall balance assessment: No apparent balance deficits (not formally assessed)                                         ADL either performed or assessed with clinical judgement   ADL Overall ADL's : Modified independent                                       General ADL Comments: reviewed tub transfer with demonstration  Cervical precautions ( handout provided): Educated patient on don doff brace with return demonstration, educated on oral care using cups, washing face with cloth, never to wash directly on incision site, avoid neck rotation flexion and extension, positioning with pillows in chair for bil UE, sleeping positioning, avoiding pushing /  pulling with bil UE, Pt educated on bathing and avoid washing directly on incision. Pt educated to use new wash cloth and towel each day. Pt educated to allow water to run across dressing and not to soak in a tub at this time. Pt advised RN will instruct on any bandages required otherwise is open to air.      Vision Baseline Vision/History: No visual deficits       Perception     Praxis      Pertinent Vitals/Pain Pain Assessment: No/denies pain     Hand Dominance Right   Extremity/Trunk Assessment Upper Extremity Assessment Upper Extremity Assessment: Overall WFL for tasks assessed   Lower Extremity Assessment Lower Extremity Assessment: Overall WFL for tasks assessed   Cervical / Trunk Assessment Cervical / Trunk Assessment: Other exceptions (s/p surg)   Communication Communication Communication: No difficulties   Cognition Arousal/Alertness: Awake/alert Behavior During Therapy: WFL for tasks assessed/performed Overall Cognitive Status: Within Functional Limits for tasks assessed                                     General Comments       Exercises  Shoulder Instructions      Home Living Family/patient expects to be discharged to:: Private residence Living Arrangements: Children Available Help at Discharge: Family;Available 24 hours/day Type of Home: House Home Access: Stairs to enter Entrance Stairs-Number of Steps: 3 Entrance Stairs-Rails: Right Home Layout: Two level Alternate Level Stairs-Number of Steps: flight Alternate Level Stairs-Rails: Right Bathroom Shower/Tub: Chief Strategy Officer: Standard     Home Equipment: None          Prior Functioning/Environment Level of Independence: Independent                 OT Problem List:        OT Treatment/Interventions:      OT Goals(Current goals can be found in the care plan section) Acute Rehab OT Goals Patient Stated Goal: home asap OT Goal Formulation:  With patient  OT Frequency:     Barriers to D/C:            Co-evaluation              End of Session    Activity Tolerance: Patient tolerated treatment well Patient left: in chair;with call bell/phone within reach;with family/visitor present  OT Visit Diagnosis: Unsteadiness on feet (R26.81)                Time: 4098-1191 OT Time Calculation (min): 12 min Charges:  OT General Charges $OT Visit: 1 Procedure OT Evaluation $OT Eval Moderate Complexity: 1 Procedure G-Codes:      Mateo Flow   OTR/L Pager: [phone] Office: [phone] .   Boone Master B 03/15/2017, 8:33 AM

## 2017-03-15 NOTE — Progress Notes (Signed)
   03/15/17 0800  OT G-codes **NOT FOR INPATIENT CLASS**  Functional Assessment Tool Used Clinical judgement  Functional Limitation Self care  Self Care Current Status [phone]) [facility]  Self Care Goal Status ([id number]) [facility]  Self Care Discharge Status [phone]) [facility]    Lisa Newton   OTR/L Pager: [phone] Office: [phone] .

## 2017-03-15 NOTE — Telephone Encounter (Signed)
Please advise her that the use of Zoloft post-op can increase bleeding risk. I will like to reassess her post-op prior to restarting, this is the safest thing to do. Also she will benefit from establishing with Psychiatry who will be able to give better recommendation. She can contact [facility] or [facility] care center to set up an appointment as soon as possible. Advise ED visit if symptoms worsens or she is having suicidal thoughts. I will like to see her soon. Thanks.  [facility] ? Mental health service [place] Address: [address] [place], [place], [place] [address] Phone: [phone]  [facility] ? [place] in [place] Address: [address], [place], [place] [address] Phone: [phone]

## 2017-03-15 NOTE — Progress Notes (Signed)
Discharged instructions/education/AVS/Rx given to patient with family at bedside and they verbalized understanding. MAE well, ambulating well independently. Voiding well. Tolerating food with no swallowing issues. Patient d/c via wheelchair.

## 2017-03-16 ENCOUNTER — Encounter (HOSPITAL_COMMUNITY): Payer: Self-pay | Admitting: Specialist

## 2017-03-16 ENCOUNTER — Telehealth (INDEPENDENT_AMBULATORY_CARE_PROVIDER_SITE_OTHER): Payer: Self-pay

## 2017-03-16 NOTE — Telephone Encounter (Signed)
Spoke with patient and she scheduled an appointment for tomorrow at 1045. Lisa Newton,CMA

## 2017-03-16 NOTE — Telephone Encounter (Signed)
Advised patient can change frequency of the Percocet to 1-2 tablets every 6 hours when necessary.  can also continue her muscle relaxer.

## 2017-03-16 NOTE — Telephone Encounter (Signed)
I called and advised pt of Fayrene Fearing' message and sched f/u appt

## 2017-03-16 NOTE — Telephone Encounter (Signed)
Patient called this AM. She had SU on 03/14/17. She states Percocet is not helping and needs something stronger. She is having a lot of pain. Also would like something to help her sleep.    CB [phone]

## 2017-03-16 NOTE — Telephone Encounter (Signed)
Please advise 

## 2017-03-17 ENCOUNTER — Ambulatory Visit: Payer: Medicaid Other | Admitting: Family Medicine

## 2017-03-17 NOTE — Telephone Encounter (Signed)
Pt had back surgery on Tuesday. surgeon gave her percocet. She is having trouble sleeping and relaxing. She would like something to help with this.  Please advise. She was not able to make today's appt because  She was in a lot of pain and barely moving.

## 2017-03-17 NOTE — Telephone Encounter (Signed)
This is likely due to pain. Advise her to take pain medicine as instructed by her surgeon. She is also on Flexeril, this should help her relax. Schedule follow up with me soon.

## 2017-03-17 NOTE — Telephone Encounter (Signed)
Patient made aware of message from MD and voiced understanding.  Made an appointment for her to see PCP on Tuesday.  Jazmin Hartsell,CMA

## 2017-03-17 NOTE — Telephone Encounter (Signed)
Will forward to MD to advise. Jazmin Hartsell,CMA  

## 2017-03-20 ENCOUNTER — Encounter (INDEPENDENT_AMBULATORY_CARE_PROVIDER_SITE_OTHER): Payer: Self-pay | Admitting: Specialist

## 2017-03-20 ENCOUNTER — Ambulatory Visit (INDEPENDENT_AMBULATORY_CARE_PROVIDER_SITE_OTHER): Payer: Medicaid Other | Admitting: Specialist

## 2017-03-20 ENCOUNTER — Telehealth (INDEPENDENT_AMBULATORY_CARE_PROVIDER_SITE_OTHER): Payer: Self-pay | Admitting: Specialist

## 2017-03-20 VITALS — BP 161/91 | HR 85 | Ht 66.0 in | Wt 186.0 lb

## 2017-03-20 DIAGNOSIS — Z9889 Other specified postprocedural states: Secondary | ICD-10-CM

## 2017-03-20 MED ORDER — GABAPENTIN 300 MG PO CAPS: 300.0000 mg | ORAL_CAPSULE | ORAL | 2 refills | Status: DC

## 2017-03-20 MED ORDER — METHYLPREDNISOLONE 4 MG PO TBPK: ORAL_TABLET | ORAL | 0 refills | Status: DC

## 2017-03-20 MED ORDER — OXYCODONE-ACETAMINOPHEN 10-325 MG PO TABS: 1.0000 | ORAL_TABLET | Freq: Four times a day (QID) | ORAL | 0 refills | Status: DC | PRN
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
## 2017-03-20 NOTE — Telephone Encounter (Signed)
I have sent form in to medicaid for prior auth already---patient is aware and she will keep checking with the pharm to see if it has been approved.

## 2017-03-20 NOTE — Telephone Encounter (Signed)
Patient went to the [facility] on [address] to get her Percocet and she states they wanted $102.00. She told them that she has medicaid and they explained it could not be filled without authorization from you.

## 2017-03-20 NOTE — Patient Instructions (Signed)
Plan:Ice locally as needed for 15-20 minutes at a time. Order a medrol dose pak to help with nerve swelling and irritation. Increase strength of narcotics to help relieve pain.    No lifting greater than 10 lbs. Avoid bending, stooping and twisting. Walking in house for first week then may start to get out slowly increasing activity using arms. Avoid overhead use of arms and overhead lifting. Wear collar for comfort. Use ice as needed for comfort.

## 2017-03-21 ENCOUNTER — Telehealth (INDEPENDENT_AMBULATORY_CARE_PROVIDER_SITE_OTHER): Payer: Self-pay | Admitting: Specialist

## 2017-03-21 ENCOUNTER — Ambulatory Visit: Payer: Medicaid Other | Admitting: Family Medicine

## 2017-03-21 ENCOUNTER — Other Ambulatory Visit: Payer: Self-pay | Admitting: Family Medicine

## 2017-03-21 NOTE — Telephone Encounter (Signed)
Patient called requesting medication, I let her know the prior authorization for her request is in the works at this time and that she is waiting on confirmation from the pharmacy. I offered advice, to call them later this afternoon ask about a time estimate since this was just handled yesterday. She asked if there was something she could get for pain in the mean time and I told her that medicaid would have to approve another medication and she would be back at square one. She agreed and will call the pharmacy this afternoon.

## 2017-03-22 ENCOUNTER — Telehealth (INDEPENDENT_AMBULATORY_CARE_PROVIDER_SITE_OTHER): Payer: Self-pay | Admitting: Specialist

## 2017-03-22 NOTE — Telephone Encounter (Signed)
Pt stated she cant take the steroid she was prescribed. Stated it made her sick.  Please call [phone]

## 2017-03-22 NOTE — Telephone Encounter (Signed)
I called pt she states that she got light headed and sick on her stomach, made her hot.  She states that she doesn't want to keep taking it. She has been in bed all day just feeling horrible.--Please advise

## 2017-03-24 ENCOUNTER — Ambulatory Visit: Payer: Medicaid Other | Admitting: Family Medicine

## 2017-03-24 ENCOUNTER — Telehealth (INDEPENDENT_AMBULATORY_CARE_PROVIDER_SITE_OTHER): Payer: Self-pay | Admitting: *Deleted

## 2017-03-24 NOTE — Telephone Encounter (Signed)
Patient called in this afternoon in regards to having some concerns about her wrist and arm pain she has been experiencing. She had surgery last week and she would really like to talk to someone if possible about this please. Her CB # [phone]. Thank you

## 2017-03-24 NOTE — Telephone Encounter (Signed)
I called and spoke with patient she states that she has had a PIC line in this arm previously and then had a blood clot to form in that arm, she states that it went to her heart.  Can you please have Dr. Ophelia Charter to advise on this?

## 2017-03-24 NOTE — Telephone Encounter (Signed)
See below, patient saw Nitka earlier this week, can you advise?

## 2017-03-26 NOTE — Telephone Encounter (Signed)
I called and discussed.

## 2017-03-28 ENCOUNTER — Ambulatory Visit (INDEPENDENT_AMBULATORY_CARE_PROVIDER_SITE_OTHER): Payer: Medicaid Other | Admitting: Family Medicine

## 2017-03-28 ENCOUNTER — Encounter: Payer: Self-pay | Admitting: Family Medicine

## 2017-03-28 DIAGNOSIS — M5412 Radiculopathy, cervical region: Secondary | ICD-10-CM | POA: Diagnosis not present

## 2017-03-28 DIAGNOSIS — I10 Essential (primary) hypertension: Secondary | ICD-10-CM

## 2017-03-28 DIAGNOSIS — F418 Other specified anxiety disorders: Secondary | ICD-10-CM | POA: Diagnosis not present

## 2017-03-28 MED ORDER — HYDROXYZINE PAMOATE 50 MG PO CAPS: 50.0000 mg | ORAL_CAPSULE | Freq: Three times a day (TID) | ORAL | 0 refills | Status: DC | PRN

## 2017-03-28 NOTE — Patient Instructions (Signed)
It was nice seeing you today. I am glad you are feeling better. Please use Hydroxyzine as needed for anxiety and insomnia. DO not use with Flexeril and don't use both when operating machinery or driving. See me soon if you have any concern.

## 2017-03-28 NOTE — Assessment & Plan Note (Signed)
BP look ok despite not on meds. Diastolic slightly elevated. We can monitor for now off meds. F/U soon if BP is consistently greater than 140/90. She agreed with plan.

## 2017-03-28 NOTE — Assessment & Plan Note (Signed)
PHQ9 of 12.  She otherwise looks well and not suicidal. She believes once she gets a job things will get better. I recommended restarting Zoloft, but she declined. I started her on Hydroxyzine prn anxiety and insomnia. I stressed the importance of not using it with her Flexeril at the same time. I also advised her to avoid use when operating machinery or driving a car due to drowsiness side effect. She verbalized understanding and agreed with plan. F/U in 4 weeks for reassessment.

## 2017-03-28 NOTE — Assessment & Plan Note (Signed)
Good ROM of her neck and left shoulder with minimal pain. F/U PT as planned and f/u with surg as planned.

## 2017-03-28 NOTE — Progress Notes (Signed)
Subjective:     Patient ID: Lisa Newton, female   DOB: 30-Aug-1971, 46 y.o.   MRN: [id number]  Hypertension  This is a chronic problem. The problem has been gradually improving since onset. The problem is controlled. Associated symptoms include anxiety. Pertinent negatives include no chest pain, headaches, malaise/fatigue, palpitations or shortness of breath. There are no associated agents to hypertension. Risk factors for coronary artery disease include dyslipidemia and stress. Past treatments include diuretics (She was prescribed HCTZ. Her last dose taken was 3 months ago or more. She stopped taking it since her BP was running normal.).  Depression         This is a recurrent (Depression associated with anxiety) problem.  The current episode started more than 1 year ago.   The onset quality is gradual.   The problem occurs intermittently (Worsened since she had her surgery, she is also stress about paying her house bill).  The problem has been waxing and waning since onset.  Associated symptoms include insomnia and decreased interest.  Associated symptoms include no helplessness, no hopelessness, no headaches and no suicidal ideas.     The symptoms are aggravated by social issues (She is stressed about not having a source of income. She has a job interview today and hope that things will start getting better).  Past treatments include SSRIs - Selective serotonin reuptake inhibitors (She self d/c Zoloft 6 months ago. She stated she was given hydroxyzine by the surgeon and it did help a lot with her symptoms. She will like to get on this instead).  Compliance with treatment is poor.  Risk factors include major life event (She just had surgery. Does not have a source of income).   Past medical history includes anxiety.   Neck pain: Feels better since she had surgery. She uses pain meds as prescribed by her surgeon prn. She endorsed occasional tingling of her left arm since the surgery. She is currently  asymptomatic.  Current Outpatient Prescriptions on File Prior to Visit  Medication Sig Dispense Refill  . Biotin 1000 MCG tablet Take 1,000 mcg by mouth daily.    . cyclobenzaprine (FLEXERIL) 5 MG tablet 1 TABLET BY MOUTH TWICE DAILY AS NEEDED FOR MUSCLE SPASM Do not use while driving 30 tablet 0  . gabapentin (NEURONTIN) 300 MG capsule Take 1 capsule (300 mg total) by mouth See admin instructions. Take 1 capsule (300 mg) by mouth 2-3 times daily 90 capsule 2  . oxyCODONE-acetaminophen (PERCOCET) 10-325 MG tablet Take 1 tablet by mouth every 6 (six) hours as needed for pain. MAXIMUM TOTAL ACETAMINOPHEN DOSE IS 4000 MG PER DAY 50 tablet 0  . atorvastatin (LIPITOR) 20 MG tablet Take 1 tablet (20 mg total) by mouth daily. (Patient not taking: Reported on 03/28/2017) 90 tablet 3  . diclofenac (VOLTAREN) 75 MG EC tablet Take 1 tablet (75 mg total) by mouth 2 (two) times daily as needed. (Patient not taking: Reported on 03/28/2017) 30 tablet 2  . hydrochlorothiazide (MICROZIDE) 12.5 MG capsule Take 1 capsule (12.5 mg total) by mouth daily. Hold if BP is consistently less than 130/80 (Patient not taking: Reported on 03/28/2017) 30 capsule 1  . sertraline (ZOLOFT) 50 MG tablet TAKE ONE TABLET BY MOUTH ONCE DAILY (Patient not taking: Reported on 03/28/2017) 30 tablet 3   Current Facility-Administered Medications on File Prior to Visit  Medication Dose Route Frequency Provider Last Rate Last Dose  . lidocaine (PF) (XYLOCAINE) 1 % injection 0.3 mL  0.3 mL Other Once  Tyrell Antonio, MD       Past Medical History:  Diagnosis Date  . Arthritis   . Depression 01/11/2012  . Hypertension   . Left ear hearing loss    Vitals:   03/28/17 1056 03/28/17 1100  BP: (!) 136/98 138/90  Pulse: 95   Temp: 98.7 F (37.1 C)   TempSrc: Oral   SpO2: 98%   Weight: 201 lb 12.8 oz (91.5 kg)   Height:  (1.676 m)      Review of Systems  Constitutional: Negative.  Negative for malaise/fatigue.  Respiratory:  Negative for shortness of breath.   Cardiovascular: Negative for chest pain and palpitations.  Gastrointestinal: Negative.   Musculoskeletal: Positive for arthralgias. Negative for joint swelling.  Neurological: Negative for headaches.  Psychiatric/Behavioral: Positive for depression. Negative for self-injury and suicidal ideas. The patient is nervous/anxious and has insomnia.   All other systems reviewed and are negative.      Objective:   Physical Exam  Constitutional: She is oriented to person, place, and time. She appears well-developed. No distress.  Neck:    Cardiovascular: Normal rate, regular rhythm and normal heart sounds.   No murmur heard. Pulmonary/Chest: Effort normal and breath sounds normal. No respiratory distress. She has no wheezes.  Abdominal: Soft. Bowel sounds are normal. She exhibits no distension and no mass. There is no tenderness.  Musculoskeletal: She exhibits no edema.  Neurological: She is alert and oriented to person, place, and time.  Psychiatric: She has a normal mood and affect. Her speech is normal and behavior is normal. Judgment and thought content normal. Cognition and memory are normal.  Nursing note and vitals reviewed.      Assessment:     HTN Depression with anxiety Cervical pain/left shoulder pain    Plan:     Check problem list.

## 2017-03-30 ENCOUNTER — Ambulatory Visit (HOSPITAL_COMMUNITY)
Admission: RE | Admit: 2017-03-30 | Discharge: 2017-03-30 | Disposition: A | Discharge status: Home / Self Care | Payer: Medicaid Other | Source: Ambulatory Visit | Attending: Specialist | Admitting: Specialist

## 2017-03-30 ENCOUNTER — Ambulatory Visit (INDEPENDENT_AMBULATORY_CARE_PROVIDER_SITE_OTHER): Payer: Medicaid Other | Admitting: Specialist

## 2017-03-30 ENCOUNTER — Encounter (INDEPENDENT_AMBULATORY_CARE_PROVIDER_SITE_OTHER): Payer: Self-pay | Admitting: Specialist

## 2017-03-30 VITALS — BP 142/97 | HR 98 | Ht 66.0 in | Wt 186.0 lb

## 2017-03-30 DIAGNOSIS — M7989 Other specified soft tissue disorders: Secondary | ICD-10-CM | POA: Diagnosis not present

## 2017-03-30 DIAGNOSIS — M79604 Pain in right leg: Secondary | ICD-10-CM

## 2017-03-30 MED ORDER — OXYCODONE-ACETAMINOPHEN 5-325 MG PO TABS: 1.0000 | ORAL_TABLET | Freq: Four times a day (QID) | ORAL | 0 refills | Status: DC | PRN

## 2017-03-30 NOTE — Progress Notes (Signed)
*  Preliminary Results* Right lower extremity venous duplex completed. Right lower extremity is negative for deep vein thrombosis. There is no evidence of right Baker's cyst.  03/30/2017 2:18 PM  Gertie Fey, BS, RVT, RDCS, RDMS
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
## 2017-03-30 NOTE — Telephone Encounter (Signed)
May stop the steroid and take tylenol. Results of doppler tests for blood clot in legs is negative.  Please let her know that I do not have time to call her back, if there is a question she can ask you to relay the  Question to me. Thanks. jen

## 2017-03-31 NOTE — Telephone Encounter (Signed)
Patient is aware--she has a f/u appt on 04/24/2017

## 2017-04-04 ENCOUNTER — Telehealth (INDEPENDENT_AMBULATORY_CARE_PROVIDER_SITE_OTHER): Payer: Self-pay | Admitting: Specialist

## 2017-04-04 NOTE — Telephone Encounter (Signed)
Patient called back again stating she was in pain and wanted to know if you wanted to work her in [place] schedule since she is still in pain

## 2017-04-04 NOTE — Telephone Encounter (Signed)
Pt wants to know what to do because she is still in pain, stated she knows she is still healing from surgery but she is still in pain, wants to know if she needs to come back in or what the next step would be is.  [phone]

## 2017-04-05 NOTE — Telephone Encounter (Signed)
Patient called back again stating she was in pain and wanted to know if you wanted to work her in Nitka's schedule since she is still in pain  

## 2017-04-06 ENCOUNTER — Other Ambulatory Visit (INDEPENDENT_AMBULATORY_CARE_PROVIDER_SITE_OTHER): Payer: Self-pay | Admitting: Specialist

## 2017-04-06 ENCOUNTER — Emergency Department (HOSPITAL_COMMUNITY): Payer: Medicaid Other

## 2017-04-06 ENCOUNTER — Encounter (HOSPITAL_COMMUNITY): Payer: Self-pay | Admitting: *Deleted

## 2017-04-06 ENCOUNTER — Telehealth (INDEPENDENT_AMBULATORY_CARE_PROVIDER_SITE_OTHER): Payer: Self-pay | Admitting: Specialist

## 2017-04-06 ENCOUNTER — Emergency Department (HOSPITAL_COMMUNITY)
Admission: EM | Admit: 2017-04-06 | Discharge: 2017-04-06 | Disposition: A | Discharge status: Home / Self Care | Payer: Medicaid Other | Attending: Emergency Medicine | Admitting: Emergency Medicine

## 2017-04-06 DIAGNOSIS — Z79899 Other long term (current) drug therapy: Secondary | ICD-10-CM | POA: Diagnosis not present

## 2017-04-06 DIAGNOSIS — M25561 Pain in right knee: Secondary | ICD-10-CM | POA: Diagnosis not present

## 2017-04-06 DIAGNOSIS — Z91040 Latex allergy status: Secondary | ICD-10-CM | POA: Insufficient documentation

## 2017-04-06 DIAGNOSIS — I10 Essential (primary) hypertension: Secondary | ICD-10-CM | POA: Insufficient documentation

## 2017-04-06 MED ORDER — OXYCODONE-ACETAMINOPHEN 5-325 MG PO TABS: 1.0000 | ORAL_TABLET | Freq: Four times a day (QID) | ORAL | 0 refills | Status: DC | PRN

## 2017-04-06 MED ORDER — PREDNISONE 20 MG PO TABS
40.0000 mg | ORAL_TABLET | Freq: Every day | ORAL | 0 refills | Status: DC
Start: 2017-04-07 — End: 2017-05-01

## 2017-04-06 MED ORDER — PREDNISONE 20 MG PO TABS
40.0000 mg | ORAL_TABLET | Freq: Once | ORAL | Status: AC
  Administered 2017-04-06: 40 mg via ORAL
  Filled 2017-04-06: qty 2

## 2017-04-06 NOTE — Telephone Encounter (Signed)
Please advise 

## 2017-04-06 NOTE — Telephone Encounter (Signed)
Apparently went to ER because her knee still hurts, xrays are negative, she was given a prescription for steroid, I printed an Rx for Percocet  #30 for her neck, we will evaluate her knee with follow up appointment as the studies  Thus far are negative. jen

## 2017-04-06 NOTE — Telephone Encounter (Signed)
Patient is needing a refill on Percocet 10 mg. CB # [phone]

## 2017-04-06 NOTE — ED Triage Notes (Signed)
c/o pain in her right upper posterior thigh states she had back surgery on 4/3 states she had the pain prior to surgery , was seen at [facility]  And had doppler study of which was neg.
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
## 2017-04-07 NOTE — Telephone Encounter (Signed)
Patient is aware rx is ready for pick up 

## 2017-04-10 ENCOUNTER — Other Ambulatory Visit: Payer: Self-pay | Admitting: Obstetrics & Gynecology

## 2017-04-10 ENCOUNTER — Other Ambulatory Visit: Payer: Self-pay | Admitting: Family Medicine

## 2017-04-10 DIAGNOSIS — Z1231 Encounter for screening mammogram for malignant neoplasm of breast: Secondary | ICD-10-CM

## 2017-04-17 ENCOUNTER — Encounter: Payer: Self-pay | Admitting: Family Medicine

## 2017-04-17 ENCOUNTER — Other Ambulatory Visit (INDEPENDENT_AMBULATORY_CARE_PROVIDER_SITE_OTHER): Payer: Self-pay | Admitting: Specialist

## 2017-04-17 ENCOUNTER — Ambulatory Visit: Payer: Medicaid Other | Admitting: Family Medicine

## 2017-04-17 ENCOUNTER — Ambulatory Visit (INDEPENDENT_AMBULATORY_CARE_PROVIDER_SITE_OTHER): Payer: Medicaid Other | Admitting: Family Medicine

## 2017-04-17 DIAGNOSIS — H10213 Acute toxic conjunctivitis, bilateral: Secondary | ICD-10-CM | POA: Diagnosis not present

## 2017-04-17 DIAGNOSIS — H109 Unspecified conjunctivitis: Secondary | ICD-10-CM | POA: Insufficient documentation

## 2017-04-17 NOTE — Assessment & Plan Note (Signed)
Patient is here with complaints and symptoms consistent with chemically induced bilateral conjunctivitis. Patient had been placing antibiotic otic drops in her eyes for 3 days. Looking up this medication appears as though this similar solution is used for eyedrops as well. To my knowledge I am unaware of the differences between these 2, however the active ingredients seem to be in the same concentration. Differential would include viral conjunctivitis. - Reassurance - I informed patient that much of these symptoms should improve now that she has stopped applying these drops. - We discussed red flag symptoms that would warrant urgent/emergent medical reevaluation. - Informed patient of over-the-counter "artificial tears" that may help if she is experiencing any dryness while she waits.

## 2017-04-17 NOTE — Telephone Encounter (Signed)
PT REQUESTED A REFILL OF HER PAIN MEDS PLEASE.   [phone]

## 2017-04-17 NOTE — Patient Instructions (Signed)
It was a pleasure seeing you today in our clinic. Today we discussed your eye redness. Here is the treatment plan we have discussed and agreed upon together:   - As you know, stop using the eardrops in your eyes. - Your symptoms should resolve over the next 1-2 days. You can try some over-the-counter "artificial tears" to help irrigate your eyes. But your symptoms will likely resolve without any additional intervention. - If you notice any worsening eyesight, worsening pain/redness, eye "floaters", or pain with eye movement come back for reevaluation or schedule an urgent visit with your optometrist/ophthalmologist.

## 2017-04-17 NOTE — Progress Notes (Signed)
   HPI  CC: Eye redness - Applied otic drops into eyes on Thurs, Fri, and Sat >> BID - Eyes are itchy, irritated, tearing often. - No visual issues at this time.  - No ocular pain. - was trying to "get me vision better"; had been provided eye drops by optometrist but never used drops given at that time >> until now. - no "floaters"; no pain with eye movement. - no photophobia; no flashes of light  ROS: Denies fever, chills, headache, cough, shortness of breath, chest pain, nausea, vomiting, diarrhea.  CC, SH/smoking status, and VS noted  Objective: BP (!) 160/92   Pulse 95   Temp 99.4 F (37.4 C) (Oral)   Ht 5\' 6"  (1.676 m)   Wt 206 lb 6.4 oz (93.6 kg)   SpO2 97%   BMI 33.31 kg/m  Gen: NAD, alert, cooperative. CV: Well-perfused. Resp: Non-labored. Neuro: Sensation intact throughout. HEENT: MMM, EOMI, PERRLA, Visual fields intact bilaterally. Bilateral scleral injection with lower half of sclera worse than the top half (bilaterally), conjunctival erythema noted bilaterally. No purulent discharge. OP clear, TMs clear bilaterally, no LAD, neck full ROM.   Assessment and plan:  Conjunctivitis Patient is here with complaints and symptoms consistent with chemically induced bilateral conjunctivitis. Patient had been placing antibiotic otic drops in her eyes for 3 days. Looking up this medication appears as though this similar solution is used for eyedrops as well. To my knowledge I am unaware of the differences between these 2, however the active ingredients seem to be in the same concentration. Differential would include viral conjunctivitis. - Reassurance - I informed patient that much of these symptoms should improve now that she has stopped applying these drops. - We discussed red flag symptoms that would warrant urgent/emergent medical reevaluation. - Informed patient of over-the-counter "artificial tears" that may help if she is experiencing any dryness while she  waits.   Kathee DeltonIan D Oris Calmes, MD,MS,  PGY3 04/17/2017 5:16 PM

## 2017-04-18 ENCOUNTER — Other Ambulatory Visit (INDEPENDENT_AMBULATORY_CARE_PROVIDER_SITE_OTHER): Payer: Self-pay | Admitting: Radiology

## 2017-04-18 ENCOUNTER — Telehealth (INDEPENDENT_AMBULATORY_CARE_PROVIDER_SITE_OTHER): Payer: Self-pay | Admitting: Specialist

## 2017-04-18 MED ORDER — HYDROCODONE-ACETAMINOPHEN 5-325 MG PO TABS: 1.0000 | ORAL_TABLET | Freq: Three times a day (TID) | ORAL | 0 refills | Status: DC | PRN

## 2017-04-18 NOTE — Telephone Encounter (Signed)
Patient called advised she is out of her pain medication. Patient said she has been taking 3 a day for the pain. Patient said she is having an MRI tomorrow and she is in a great deal of pain. Patient said her neck, back and right leg is where she is having the pain. The number to contact patient is [phone]  

## 2017-04-18 NOTE — Telephone Encounter (Signed)
Patient called advised she is out of her pain medication. Patient said she has been taking 3 a day for the pain. Patient said she is having an MRI tomorrow and she is in a great deal of pain. Patient said her neck, back and right leg is where she is having the pain. The number to contact patient is [phone]

## 2017-04-19 NOTE — Telephone Encounter (Signed)
James approved Norco 5-325mg ---- I got auth from [employer].   PA # [id number], Inertaction ID # [id number]. I called and spoke with Penni BombardKendall at [facility]

## 2017-04-20 ENCOUNTER — Telehealth (INDEPENDENT_AMBULATORY_CARE_PROVIDER_SITE_OTHER): Payer: Self-pay | Admitting: Specialist

## 2017-04-20 NOTE — Telephone Encounter (Signed)
I called and advised patient,  She has appt on 04/24/17 @ 115 and she is having a lot of pain in her back and in her leg.  [facility] and she will bring a CD of the images to this appt

## 2017-04-20 NOTE — Telephone Encounter (Signed)
Patient called advised the medication (Hydrocodone)   is not working. Patient advised she use to get (Percocet) prescribed for her. Patient asked if there is anything else she can take for the pain.

## 2017-04-20 NOTE — Telephone Encounter (Signed)
Patient called advised the medication (Hydrocodone)   is not working. Patient advised she use to get (Percocet) prescribed for her. Patient asked if there is anything else she can take for the pain. The number to contact patient is [phone]

## 2017-04-20 NOTE — Telephone Encounter (Signed)
I don't recommend continuing the oxycodone. Lisa Newton

## 2017-04-20 NOTE — Telephone Encounter (Signed)
She needs to make an active attempt to stop using strong narcotics, this includes the use of hydrocodone, I do not think she needs the same amount and type of pain medication that she was given immediately following surgery over 4 weeks ago. She can take motrin or alleve in between doses of the narcotic and use ice for discomfort. Lisa Newton

## 2017-04-21 ENCOUNTER — Ambulatory Visit: Payer: Medicaid Other

## 2017-04-24 ENCOUNTER — Encounter (INDEPENDENT_AMBULATORY_CARE_PROVIDER_SITE_OTHER): Payer: Self-pay | Admitting: Specialist

## 2017-04-24 ENCOUNTER — Ambulatory Visit (INDEPENDENT_AMBULATORY_CARE_PROVIDER_SITE_OTHER): Payer: Medicaid Other | Admitting: Specialist

## 2017-04-24 VITALS — BP 137/86 | HR 89 | Ht 66.0 in | Wt 186.0 lb

## 2017-04-24 DIAGNOSIS — R200 Anesthesia of skin: Secondary | ICD-10-CM

## 2017-04-24 DIAGNOSIS — R202 Paresthesia of skin: Secondary | ICD-10-CM | POA: Diagnosis not present

## 2017-04-24 DIAGNOSIS — M48062 Spinal stenosis, lumbar region with neurogenic claudication: Secondary | ICD-10-CM | POA: Diagnosis not present

## 2017-04-24 MED ORDER — HYDROCODONE-ACETAMINOPHEN 5-325 MG PO TABS: 1.0000 | ORAL_TABLET | Freq: Three times a day (TID) | ORAL | 0 refills | Status: DC | PRN
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
## 2017-04-24 NOTE — Patient Instructions (Signed)
Avoid frequent bending and stooping  No lifting greater than 10 lbs. May use ice or moist heat for pain. Weight loss is of benefit.   Avoid bending, stooping and avoid lifting weights greater than 10 lbs. Avoid prolong standing and walking. Avoid frequent bending and stooping   Dr. [place] BlasNewton's secretary/Assistant will call to arrange for epidural steroid injection

## 2017-05-01 ENCOUNTER — Ambulatory Visit (INDEPENDENT_AMBULATORY_CARE_PROVIDER_SITE_OTHER): Payer: Medicaid Other

## 2017-05-01 ENCOUNTER — Ambulatory Visit (HOSPITAL_COMMUNITY)
Admission: EM | Admit: 2017-05-01 | Discharge: 2017-05-01 | Disposition: A | Discharge status: Home / Self Care | Payer: Medicaid Other | Attending: Family Medicine | Admitting: Family Medicine

## 2017-05-01 ENCOUNTER — Encounter (HOSPITAL_COMMUNITY): Payer: Self-pay | Admitting: Emergency Medicine

## 2017-05-01 DIAGNOSIS — S161XXA Strain of muscle, fascia and tendon at neck level, initial encounter: Secondary | ICD-10-CM

## 2017-05-01 MED ORDER — DICLOFENAC SODIUM 75 MG PO TBEC: 75.0000 mg | DELAYED_RELEASE_TABLET | Freq: Two times a day (BID) | ORAL | 0 refills | Status: DC

## 2017-05-01 MED ORDER — CYCLOBENZAPRINE HCL 5 MG PO TABS: 5.0000 mg | ORAL_TABLET | Freq: Every day | ORAL | 0 refills | Status: DC
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
## 2017-05-01 NOTE — ED Triage Notes (Signed)
Patient reports head, back and neck soreness.  Neck surgery was March 14, 2017.
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
## 2017-05-16 ENCOUNTER — Telehealth (INDEPENDENT_AMBULATORY_CARE_PROVIDER_SITE_OTHER): Payer: Self-pay | Admitting: Specialist

## 2017-05-16 NOTE — Telephone Encounter (Signed)
Patient called advised she is having the MRI 05/21/17 and asked if she can be worked into Dr Otelia SergeantNitka schedule for MRI review? Patient asked if she can be scheduled in the morning because she just started a new job and have to be there at 3:00pm. The number to contact patient is [phone]

## 2017-05-17 NOTE — Telephone Encounter (Signed)
I called and advised that I do not have anything sooner, I will call her as soon as something opens up.

## 2017-05-21 ENCOUNTER — Ambulatory Visit
Admission: RE | Admit: 2017-05-21 | Discharge: 2017-05-21 | Disposition: A | Discharge status: Home / Self Care | Payer: Medicaid Other | Source: Ambulatory Visit | Attending: Specialist | Admitting: Specialist

## 2017-05-21 DIAGNOSIS — R200 Anesthesia of skin: Secondary | ICD-10-CM

## 2017-05-21 DIAGNOSIS — R202 Paresthesia of skin: Principal | ICD-10-CM

## 2017-05-22 NOTE — Telephone Encounter (Signed)
Pt is aware, she has appt on 05/25/2017 @ 915 to review her MRI

## 2017-05-24 ENCOUNTER — Ambulatory Visit: Payer: Medicaid Other | Admitting: Physical Therapy

## 2017-05-25 ENCOUNTER — Encounter (INDEPENDENT_AMBULATORY_CARE_PROVIDER_SITE_OTHER): Payer: Self-pay | Admitting: Specialist

## 2017-05-25 ENCOUNTER — Ambulatory Visit (INDEPENDENT_AMBULATORY_CARE_PROVIDER_SITE_OTHER): Payer: Medicaid Other | Admitting: Specialist

## 2017-05-25 VITALS — BP 137/85 | HR 74 | Ht 66.0 in | Wt 186.0 lb

## 2017-05-25 DIAGNOSIS — M48062 Spinal stenosis, lumbar region with neurogenic claudication: Secondary | ICD-10-CM

## 2017-05-25 DIAGNOSIS — M961 Postlaminectomy syndrome, not elsewhere classified: Secondary | ICD-10-CM

## 2017-05-25 DIAGNOSIS — M5030 Other cervical disc degeneration, unspecified cervical region: Secondary | ICD-10-CM

## 2017-05-25 DIAGNOSIS — M5116 Intervertebral disc disorders with radiculopathy, lumbar region: Secondary | ICD-10-CM

## 2017-05-25 DIAGNOSIS — R202 Paresthesia of skin: Secondary | ICD-10-CM

## 2017-05-25 DIAGNOSIS — R200 Anesthesia of skin: Secondary | ICD-10-CM

## 2017-05-25 MED ORDER — KELO-COTE EX GEL: 1.0000 g | Freq: Every day | CUTANEOUS | 1 refills | Status: DC

## 2017-05-25 MED ORDER — HYDROCODONE-ACETAMINOPHEN 5-325 MG PO TABS: 1.0000 | ORAL_TABLET | Freq: Three times a day (TID) | ORAL | 0 refills | Status: DC | PRN
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
## 2017-06-01 ENCOUNTER — Telehealth (INDEPENDENT_AMBULATORY_CARE_PROVIDER_SITE_OTHER): Payer: Self-pay | Admitting: *Deleted

## 2017-06-01 NOTE — Telephone Encounter (Signed)
ERROR

## 2017-06-01 NOTE — Telephone Encounter (Signed)
Called pt for her 2 week FU with Dr. Otelia SergeantNitka. I scheduled it for 2/28 @1 :30

## 2017-06-05 ENCOUNTER — Telehealth (INDEPENDENT_AMBULATORY_CARE_PROVIDER_SITE_OTHER): Payer: Self-pay | Admitting: Specialist

## 2017-06-05 NOTE — Telephone Encounter (Signed)
Patient called advised she is still in a lot of pain. Patient asked if she can get something else for pain. Patient said she got an injection in her lower back and the left side of her shoulder is swollen. Patient said the Hydrocodone is not working.---Please advise

## 2017-06-05 NOTE — Telephone Encounter (Signed)
Patient called advised she is still in a lot of pain. Patient asked if she can get something else for pain. Patient said she got an injection in her lower back and the left side of her shoulder is swollen. Patient said the Hydrocodone is not working. The number to contact patient is [phone]

## 2017-06-06 ENCOUNTER — Telehealth (INDEPENDENT_AMBULATORY_CARE_PROVIDER_SITE_OTHER): Payer: Self-pay | Admitting: Specialist

## 2017-06-06 NOTE — Telephone Encounter (Signed)
Hydrocodone should be adequate, can not expect no pain, she has a disc herniation, stronger narcotics are not a solution. jen

## 2017-06-06 NOTE — Telephone Encounter (Signed)
Patient called advised the Hydrocodone is not doing anything to stop the pain she is having. Patient said her neck and shoulder is still swollen but is going down some. Patient said she is out of her muscle relaxer as well. Patient said she was not feeling that bad until after she had the injection. The number to contact patient is [phone]

## 2017-06-06 NOTE — Telephone Encounter (Signed)
Patient called advised the Hydrocodone is not doing anything to stop the pain she is having. Patient said her neck and shoulder is still swollen but is going down some. Patient said she is out of her muscle relaxer as well. Patient said she was not feeling that bad until after she had the injection.

## 2017-06-07 NOTE — Telephone Encounter (Signed)
I don't recommend a stronger narcotic for her condition. Ice or heat as needed. Hydrocodone should be adequate. jen

## 2017-06-08 ENCOUNTER — Ambulatory Visit (INDEPENDENT_AMBULATORY_CARE_PROVIDER_SITE_OTHER): Payer: Medicaid Other | Admitting: Specialist

## 2017-06-08 ENCOUNTER — Encounter (INDEPENDENT_AMBULATORY_CARE_PROVIDER_SITE_OTHER): Payer: Self-pay | Admitting: Specialist

## 2017-06-08 VITALS — BP 129/86 | HR 71 | Ht 66.0 in | Wt 186.0 lb

## 2017-06-08 DIAGNOSIS — R200 Anesthesia of skin: Secondary | ICD-10-CM | POA: Diagnosis not present

## 2017-06-08 DIAGNOSIS — R202 Paresthesia of skin: Secondary | ICD-10-CM | POA: Diagnosis not present

## 2017-06-08 DIAGNOSIS — L989 Disorder of the skin and subcutaneous tissue, unspecified: Secondary | ICD-10-CM

## 2017-06-08 DIAGNOSIS — H6192 Disorder of left external ear, unspecified: Secondary | ICD-10-CM

## 2017-06-08 DIAGNOSIS — M5126 Other intervertebral disc displacement, lumbar region: Secondary | ICD-10-CM

## 2017-06-08 DIAGNOSIS — M5116 Intervertebral disc disorders with radiculopathy, lumbar region: Secondary | ICD-10-CM

## 2017-06-08 DIAGNOSIS — M48062 Spinal stenosis, lumbar region with neurogenic claudication: Secondary | ICD-10-CM

## 2017-06-08 MED ORDER — CYCLOBENZAPRINE HCL 10 MG PO TABS: 10.0000 mg | ORAL_TABLET | Freq: Three times a day (TID) | ORAL | 0 refills | Status: DC | PRN

## 2017-06-08 MED ORDER — HYDROCODONE-ACETAMINOPHEN 5-325 MG PO TABS: 1.0000 | ORAL_TABLET | Freq: Three times a day (TID) | ORAL | 0 refills | Status: DC | PRN

## 2017-06-08 MED ORDER — IBUPROFEN 800 MG PO TABS: 800.0000 mg | ORAL_TABLET | Freq: Three times a day (TID) | ORAL | 0 refills | Status: DC | PRN

## 2017-06-08 NOTE — Telephone Encounter (Signed)
Pt came in for appt today.  
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
## 2017-06-13 ENCOUNTER — Telehealth (INDEPENDENT_AMBULATORY_CARE_PROVIDER_SITE_OTHER): Payer: Self-pay

## 2017-06-13 NOTE — Telephone Encounter (Signed)
Malvin JohnsFYI-  Elizabeth with [facility] wanted to let Dr. Otelia SergeantNitka know that medicaid does not approve PT, unless it is a new diagnosis.  Stated that she will still try and see, but it may be denied for PT and will call back to let us know for sure.  CB# is [phone].

## 2017-06-13 NOTE — Telephone Encounter (Signed)
FYI-  Lisa Newton with [facility] wanted to let Dr. Nitka know that medicaid does not approve PT, unless it is a new diagnosis.  Stated that she will still try and see, but it may be denied for PT and will call back to let us know for sure.  CB# is [phone]. 

## 2017-06-15 ENCOUNTER — Telehealth (INDEPENDENT_AMBULATORY_CARE_PROVIDER_SITE_OTHER): Payer: Self-pay | Admitting: *Deleted

## 2017-06-15 NOTE — Telephone Encounter (Signed)
-----   Message from Ronnette Hilaiffany M Adams sent at 06/13/2017 11:37 AM EDT ----- Regarding: RE: [facility] services visit for PT is  scheduled for today. We'll then get the eval sent  of to Medicaid for approval. Enjoy your day!  ----- Message ----- From: Samuella CotaSimmons, Kevron Patella T, CMA Sent: 06/12/2017   1:42 PM To: Ronnette Hilaiffany M Adams Subject: HH referral                                    Good afternoon,   Can you please evaluate [facility].   Thanks [place]

## 2017-06-15 NOTE — Telephone Encounter (Deleted)
-----   Message from Tiffany M Adams sent at 06/13/2017 11:37 AM EDT ----- Regarding: RE: HH referral Ms. Cousins start of services visit for PT is  scheduled for today. We'll then get the eval sent  of to Medicaid for approval. Enjoy your day!  ----- Message ----- From: Roxana Lai T, CMA Sent: 06/12/2017   1:42 PM To: Tiffany M Adams Subject: HH referral                                    Good afternoon,   Can you please evaluate this patient for HH.   Thanks Kandra Graven 
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
## 2017-06-16 NOTE — Telephone Encounter (Signed)
Okay, jen 

## 2017-07-03 ENCOUNTER — Ambulatory Visit (INDEPENDENT_AMBULATORY_CARE_PROVIDER_SITE_OTHER): Payer: Medicaid Other | Admitting: Neurology

## 2017-07-03 ENCOUNTER — Encounter: Payer: Self-pay | Admitting: Neurology

## 2017-07-03 VITALS — HR 69 | Ht 66.0 in | Wt 202.0 lb

## 2017-07-03 DIAGNOSIS — M5416 Radiculopathy, lumbar region: Secondary | ICD-10-CM | POA: Diagnosis not present

## 2017-07-03 DIAGNOSIS — M5010 Cervical disc disorder with radiculopathy, unspecified cervical region: Secondary | ICD-10-CM | POA: Diagnosis not present

## 2017-07-03 DIAGNOSIS — M5020 Other cervical disc displacement, unspecified cervical region: Secondary | ICD-10-CM

## 2017-07-03 DIAGNOSIS — F418 Other specified anxiety disorders: Secondary | ICD-10-CM

## 2017-07-03 MED ORDER — DULOXETINE HCL 60 MG PO CPEP: 60.0000 mg | ORAL_CAPSULE | Freq: Every day | ORAL | 11 refills | Status: DC
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
## 2017-07-07 ENCOUNTER — Other Ambulatory Visit (INDEPENDENT_AMBULATORY_CARE_PROVIDER_SITE_OTHER): Payer: Self-pay | Admitting: Specialist

## 2017-07-07 DIAGNOSIS — M48062 Spinal stenosis, lumbar region with neurogenic claudication: Secondary | ICD-10-CM

## 2017-07-07 DIAGNOSIS — M5126 Other intervertebral disc displacement, lumbar region: Secondary | ICD-10-CM

## 2017-07-07 DIAGNOSIS — Z9889 Other specified postprocedural states: Secondary | ICD-10-CM

## 2017-07-07 DIAGNOSIS — R202 Paresthesia of skin: Secondary | ICD-10-CM

## 2017-07-07 DIAGNOSIS — R200 Anesthesia of skin: Secondary | ICD-10-CM

## 2017-07-07 DIAGNOSIS — M5116 Intervertebral disc disorders with radiculopathy, lumbar region: Secondary | ICD-10-CM

## 2017-07-07 MED ORDER — GABAPENTIN 300 MG PO CAPS: 300.0000 mg | ORAL_CAPSULE | ORAL | 2 refills | Status: DC

## 2017-07-07 MED ORDER — CYCLOBENZAPRINE HCL 5 MG PO TABS: 5.0000 mg | ORAL_TABLET | Freq: Every day | ORAL | 0 refills | Status: DC

## 2017-07-07 MED ORDER — IBUPROFEN 800 MG PO TABS: 800.0000 mg | ORAL_TABLET | Freq: Three times a day (TID) | ORAL | 0 refills | Status: DC | PRN

## 2017-07-07 NOTE — Telephone Encounter (Signed)
PT CALLED AND STATED SHE HAS PT STARTING ON  Tuesday AND THAT SHE HAS A NERVE TEST 8/15.   ALSO, THAT SHE NEEDS HER MEDS REFILLED-GABAPENTON, MUSCLE RELAXER, AND HYDROCODONE PLEASE.  [phone]

## 2017-07-07 NOTE — Telephone Encounter (Signed)
Sent to Nitka  

## 2017-07-07 NOTE — Telephone Encounter (Signed)
I prescribed motrin, flexeril and gabapentin. No further narcotics recommended. No hydrocodone, she is well past the post op period for narcotics, they only lead to addiction.  Lisa Newton

## 2017-07-10 NOTE — Telephone Encounter (Signed)
Can you call in the neurontin and call pt and advise

## 2017-07-11 ENCOUNTER — Encounter (INDEPENDENT_AMBULATORY_CARE_PROVIDER_SITE_OTHER): Payer: Self-pay | Admitting: Specialist

## 2017-07-11 ENCOUNTER — Encounter: Payer: Self-pay | Admitting: Physical Therapy

## 2017-07-11 ENCOUNTER — Ambulatory Visit: Payer: Medicaid Other | Attending: Family Medicine | Admitting: Physical Therapy

## 2017-07-11 ENCOUNTER — Ambulatory Visit (INDEPENDENT_AMBULATORY_CARE_PROVIDER_SITE_OTHER): Payer: Medicaid Other | Admitting: Specialist

## 2017-07-11 VITALS — BP 152/87 | HR 63 | Ht 66.0 in | Wt 227.0 lb

## 2017-07-11 DIAGNOSIS — M48062 Spinal stenosis, lumbar region with neurogenic claudication: Secondary | ICD-10-CM | POA: Diagnosis not present

## 2017-07-11 DIAGNOSIS — M5416 Radiculopathy, lumbar region: Secondary | ICD-10-CM | POA: Diagnosis present

## 2017-07-11 DIAGNOSIS — R200 Anesthesia of skin: Secondary | ICD-10-CM | POA: Diagnosis not present

## 2017-07-11 DIAGNOSIS — M5126 Other intervertebral disc displacement, lumbar region: Secondary | ICD-10-CM | POA: Diagnosis not present

## 2017-07-11 DIAGNOSIS — R202 Paresthesia of skin: Secondary | ICD-10-CM | POA: Diagnosis not present

## 2017-07-11 DIAGNOSIS — M79604 Pain in right leg: Secondary | ICD-10-CM | POA: Diagnosis present

## 2017-07-11 DIAGNOSIS — M5441 Lumbago with sciatica, right side: Secondary | ICD-10-CM | POA: Insufficient documentation

## 2017-07-11 MED ORDER — HYDROCODONE-ACETAMINOPHEN 5-325 MG PO TABS: 1.0000 | ORAL_TABLET | Freq: Three times a day (TID) | ORAL | 0 refills | Status: DC | PRN

## 2017-07-11 NOTE — Patient Instructions (Signed)
Angry Cat Stretch    Tuck chin and tighten stomach, arching back. Repeat __10__ times per set. Do __1__ sets per session. Do ___2_ sessions per day.  http://orth.exer.us/119   Copyright  VHI. All rights reserved.  BACK: Child's Pose (Sciatica)    Sit in knee-chest position and reach arms forward. Separate knees for comfort. Hold position for __3_ breaths. Repeat __3-5_ times. Do __2_ times per day.  Copyright  VHI. All rights reserved.    Reducing Load   Copyright  VHI. All rights reserved.  BODY MECHANICS Tips Good body mechanics are important during activities of daily living. The practice of good body mechanics will: -help distribute weight throughout the skeleton in a more anatomically correct manner thus stimulating more normal forces on the bones, and encouraging stronger, healthier, denser bones. -reduce unnatural forces on bones, ligaments, joints and muscles and reduce risk of fracture, other injury or back pain. A WORD ON BODY POSITIONING: Sitting is the hardest position for the back. Lying on the back is the easiest. Standing, in good body alignment, is somewhere between. A good motto is: Sit less, stand more, and, when you can't do that, lie down on your back and exercise to strengthen it.  Copyright  VHI. All rights reserved.       Supine to Sit (Active)   Lie on back, left leg bent. Roll to other side. From side-lying, sit up on side of bed. Complete ___ sets of ___ repetitions. Perform ___ sessions per day.  Copyright  VHI. All rights reserved.    Housework - Reaching Down   If you are unable to bend your knees or squat, use a lazy Darl PikesSusan to keep items within easy reach. Store only light, unbreakable items on the lowest shelves, and use a reacher to pick them up.  Copyright  VHI. All rights reserved.  Low Shelf   Squat down, and bring item close to lift.   Copyright  VHI. All rights reserved.  Lifting Principles .Maintain proper posture  and head alignment. .Slide object as close as possible before lifting. .Move obstacles out of the way. .Test before lifting; ask for help if too heavy. .Tighten stomach muscles without holding breath. .Use smooth movements; do not jerk. .Use legs to do the work, and pivot with feet. .Distribute the work load symmetrically and close to the center of trunk. .Push instead of pull whenever possible.  Copyright  VHI. All rights reserved.  Posture - Standing   Good posture is important. Avoid slouching and forward head thrust. Maintain curve in low back and align ears over shoul- ders, hips over ankles.   Copyright  VHI. All rights reserved.   Posture - Sitting   Sit upright, head facing forward. Try using a roll to support lower back. Keep shoulders relaxed, and avoid rounded back. Keep hips level with knees. Avoid crossing legs for long periods.   Copyright  VHI. All rights reserved.  Ideal Posture Use with figures on 3 (2 of 2): 1.Head erect 2.Chin in 3.Chest and navel aligned 4.Spinal curves maintained 5.Knees relaxed 6.Shoulders and hips aligned 7.Feet slightly apart 8.Toes and arches active 9.Abdomen taut (breathe with diaphragm) 10.Arms at sides Ideal posture is: -pain free. -achieved with practice, mindful interest, and body awareness.  Copyright  VHI. All rights reserved.     Move heavy items one at a time, or move portions of the contents.   Posture Awareness     Stand and check posture: Jut chin, pull back to comfortable  position. Tilt pelvis forward, back; be sure back is not swayed. Roll from heels to balls of feet, then distribute your weight evenly. Picture a line through spine pulling you erect. Focus on breathing. Good Posture = Better Breathing. Check ____ times per day.  http://gt2.exer.us/873   Copyright  VHI. All rights reserved.  Sleeping on Back  Place pillow under knees. A pillow with cervical support and a roll around waist are also  helpful. Copyright  VHI. All rights reserved.  Sleeping on Side Place pillow between knees. Use cervical support under neck and a roll around waist as needed. Copyright  VHI. All rights reserved.   Sleeping on Stomach   If this is the only desirable sleeping position, place pillow under lower legs, and under stomach or chest as needed.  Posture - Sitting   Sit upright, head facing forward. Try using a roll to support lower back. Keep shoulders relaxed, and avoid rounded back. Keep hips level with knees. Avoid crossing legs for long periods. Stand to Sit / Sit to Stand   To sit: Bend knees to lower self onto front edge of chair, then scoot back on seat. To stand: Reverse sequence by placing one foot forward, and scoot to front of seat. Use rocking motion to stand up.   Work Height and Reach  Ideal work height is no more than 2 to 4 inches below elbow level when standing, and at elbow level when sitting. Reaching should be limited to arm's length, with elbows slightly bent.  Bending  Bend at hips and knees, not back. Keep feet shoulder-width apart.    Posture - Standing   Good posture is important. Avoid slouching and forward head thrust. Maintain curve in low back and align ears over shoul- ders, hips over ankles.  Alternating Positions   Alternate tasks and change positions frequently to reduce fatigue and muscle tension. Take rest breaks. Computer Work   Position work to Art gallery manager. Use proper work and seat height. Keep shoulders back and down, wrists straight, and elbows at right angles. Use chair that provides full back support. Add footrest and lumbar roll as needed.  Getting Into / Out of Car  Lower self onto seat, scoot back, then bring in one leg at a time. Reverse sequence to get out.  Dressing  Lie on back to pull socks or slacks over feet, or sit and bend leg while keeping back straight.    Housework - Sink  Place one foot on ledge of cabinet under  sink when standing at sink for prolonged periods.   Pushing / Pulling  Pushing is preferable to pulling. Keep back in proper alignment, and use leg muscles to do the work.  Deep Squat   Squat and lift with both arms held against upper trunk. Tighten stomach muscles without holding breath. Use smooth movements to avoid jerking.  Avoid Twisting   Avoid twisting or bending back. Pivot around using foot movements, and bend at knees if needed when reaching for articles.  Carrying Luggage   Distribute weight evenly on both sides. Use a cart whenever possible. Do not twist trunk. Move body as a unit.   Lifting Principles .Maintain proper posture and head alignment. .Slide object as close as possible before lifting. .Move obstacles out of the way. .Test before lifting; ask for help if too heavy. .Tighten stomach muscles without holding breath. .Use smooth movements; do not jerk. .Use legs to do the work, and pivot with feet. .Distribute the work load  symmetrically and close to the center of trunk. .Push instead of pull whenever possible.   Ask For Help   Ask for help and delegate to others when possible. Coordinate your movements when lifting together, and maintain the low back curve.  Log Roll   Lying on back, bend left knee and place left arm across chest. Roll all in one movement to the right. Reverse to roll to the left. Always move as one unit. Housework - Sweeping  Use long-handled equipment to avoid stooping.   Housework - Wiping  Position yourself as close as possible to reach work surface. Avoid straining your back.  Laundry - Unloading Wash   To unload small items at bottom of washer, lift leg opposite to arm being used to reach.  Gardening - Raking  Move close to area to be raked. Use arm movements to do the work. Keep back straight and avoid twisting.     Cart  When reaching into cart with one arm, lift opposite leg to keep back straight.    Getting Into / Out of Bed  Lower self to lie down on one side by raising legs and lowering head at the same time. Use arms to assist moving without twisting. Bend both knees to roll onto back if desired. To sit up, start from lying on side, and use same move-ments in reverse. Housework - Vacuuming  Hold the vacuum with arm held at side. Step back and forth to move it, keeping head up. Avoid twisting.   Laundry - Armed forces training and education officerLoading Wash  Position laundry basket so that bending and twisting can be avoided.   Laundry - Unloading Dryer  Squat down to reach into clothes dryer or use a reacher.  Gardening - Weeding / Psychiatric nurselanting  Squat or Kneel. Knee pads may be helpful.

## 2017-07-11 NOTE — Patient Instructions (Signed)
Avoid bending, stooping and avoid lifting weights greater than 10 lbs. Avoid prolong standing and walking. Avoid frequent bending and stooping  No lifting greater than 10 lbs. May use ice or moist heat for pain. Weight loss is of benefit. Handicap license is approved.  
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
## 2017-07-11 NOTE — Telephone Encounter (Signed)
I called in the Neurontin and notified patient 07/10/17.

## 2017-07-24 ENCOUNTER — Ambulatory Visit: Payer: Medicaid Other | Attending: Family Medicine | Admitting: Physical Therapy

## 2017-07-24 DIAGNOSIS — M5441 Lumbago with sciatica, right side: Secondary | ICD-10-CM | POA: Diagnosis present

## 2017-07-24 DIAGNOSIS — M5416 Radiculopathy, lumbar region: Secondary | ICD-10-CM

## 2017-07-24 DIAGNOSIS — M79604 Pain in right leg: Secondary | ICD-10-CM | POA: Insufficient documentation
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
## 2017-07-26 ENCOUNTER — Encounter (INDEPENDENT_AMBULATORY_CARE_PROVIDER_SITE_OTHER): Payer: Self-pay | Admitting: Neurology

## 2017-07-26 ENCOUNTER — Ambulatory Visit (INDEPENDENT_AMBULATORY_CARE_PROVIDER_SITE_OTHER): Payer: Medicaid Other | Admitting: Neurology

## 2017-07-26 DIAGNOSIS — Z0289 Encounter for other administrative examinations: Secondary | ICD-10-CM

## 2017-07-26 DIAGNOSIS — M5010 Cervical disc disorder with radiculopathy, unspecified cervical region: Secondary | ICD-10-CM | POA: Diagnosis not present

## 2017-07-26 DIAGNOSIS — F418 Other specified anxiety disorders: Secondary | ICD-10-CM

## 2017-07-26 DIAGNOSIS — M5416 Radiculopathy, lumbar region: Secondary | ICD-10-CM

## 2017-07-26 DIAGNOSIS — M5020 Other cervical disc displacement, unspecified cervical region: Secondary | ICD-10-CM

## 2017-07-26 NOTE — Procedures (Signed)
Full Name: Lisa Newton Gender: Female MRN #: [id number] Date of Birth: 12-28-2070    Visit Date: 07/26/17 11:19 Age: 46 Years 6 Months Old Examining Physician: Levert FeinsteinYijun Delainie Chavana, MD  Referring Physician: Terrace ArabiaYan, MD History: 46 years old female, with history of cervical decompression surgery, presenting with left neck pain radiating pain to left arm, chronic low back pain, radiating pain to right lower extremity  Summary of the test: Nerve conduction study: Left median, ulnar, radial sensory responses were normal. Left median and ulnar motor responses were normal.  Right sural, superficial peroneal sensory responses were normal. Right peroneal to EDB and tibial motor responses were normal.  Electromyography: Selected needle examination of left upper extremity,right lower extremity, left cervical, right lumbosacral paraspinal muscles were normal.  Conclusion: This is a normal study. There is no electrodiagnostic evidence of left upper extremity neuropathy, left cervical radiculopathy, or right lumbosacral radiculopathy.    ------------------------------- Levert FeinsteinYijun Shahil Speegle, M.D.  [facility] [address] [place], [place] [address] Tel: [phone] Fax: [phone]        [facility]    Nerve / Sites Muscle Latency Ref. Amplitude Ref. Rel Amp Segments Distance Velocity Ref. Area    ms ms mV mV %  cm m/s m/s mVms  L Median - APB     Wrist APB 4.1 ?4.4 6.1 ?4.0 100 Wrist - APB 7   19.3     Upper arm APB 8.3  5.7  93.5 Upper arm - Wrist 23 55 ?49 18.1  L Ulnar - ADM     Wrist ADM 2.9 ?3.3 10.6 ?6.0 100 Wrist - ADM 7   31.7     B.Elbow ADM 6.4  9.9  93.3 B.Elbow - Wrist 19 54 ?49 30.8     A.Elbow ADM 8.1  9.6  96.7 A.Elbow - B.Elbow 10 58 ?49 30.0         A.Elbow - Wrist      R Peroneal - EDB     Ankle EDB 5.7 ?6.5 6.1 ?2.0 100 Ankle - EDB 9   17.3     Fib head EDB 11.4  5.7  93.4 Fib head - Ankle 28 49 ?44 16.8     Pop fossa EDB 13.2  5.5  97.3 Pop fossa - Fib head 10  55 ?44 16.7         Pop fossa - Ankle      R Tibial - AH     Ankle AH 4.5 ?5.8 12.4 ?4.0 100 Ankle - AH 9   33.5     Pop fossa AH 12.9  11.0  88.2 Pop fossa - Ankle 37 44 ?41 31.2             SNC    Nerve / Sites Rec. Site Peak Lat Ref.  Amp Ref. Segments Distance    ms ms V V  cm  L Radial - Anatomical snuff box (Forearm)     Forearm Wrist 2.7 ?2.9 23 ?15 Forearm - Wrist 10  R Sural - Ankle (Calf)     Calf Ankle 3.1 ?4.4 29 ?6 Calf - Ankle 14  R Superficial peroneal - Ankle     Lat leg Ankle 3.5 ?4.4 23 ?6 Lat leg - Ankle 14  L Median - Orthodromic (Dig II, Mid palm)     Dig II Wrist 3.1 ?3.4 20 ?10 Dig II - Wrist 13  L Ulnar - Orthodromic, (Dig V, Mid palm)  Dig V Wrist 2.6 ?3.1 17 ?5 Dig V - Wrist 25               F  Wave    Nerve F Lat Ref.   ms ms  L Ulnar - ADM 26.8 ?32.0  R Tibial - AH 47.4 ?56.0         H Reflex    Nerve H Lat Lat Hmax   ms ms   Left Right Ref. Left Right Ref.  Tibial - Soleus 30.8 30.2 ?35.0 31.9 31.8 ?35.0         EMG full       EMG Summary Table    Spontaneous MUAP Recruitment  Muscle IA Fib PSW Fasc Other Amp Dur. Poly Pattern  L. Pronator teres Normal None None None _______ Normal Normal Normal Normal  L. Deltoid Normal None None None _______ Normal Normal Normal Normal  L. Biceps brachii Normal None None None _______ Normal Normal Normal Normal  L. Extensor digitorum communis Normal None None None _______ Normal Normal Normal Normal  R. Tibialis anterior Normal None None None _______ Normal Normal Normal Normal  R. Tibialis posterior Normal None None None _______ Normal Normal Normal Normal  R. Vastus lateralis Normal None None None _______ Normal Normal Normal Normal  R. Lumbar paraspinals (mid) Normal None None None _______ Normal Normal Normal Normal  R. Lumbar paraspinals (low) Normal None None None _______ Normal Normal Normal Normal  L. Cervical paraspinals Normal None None None _______ Normal Normal Normal Normal

## 2017-07-27 ENCOUNTER — Encounter: Payer: Medicaid Other | Admitting: Physical Therapy

## 2017-07-27 ENCOUNTER — Telehealth: Payer: Self-pay

## 2017-07-27 ENCOUNTER — Ambulatory Visit: Payer: Medicaid Other

## 2017-07-27 NOTE — Telephone Encounter (Signed)
Forgot appointment as she had to take daughter to dentist. Informed that next appointment  was 8/21 at 430 PM. She said she would be here

## 2017-07-31 ENCOUNTER — Encounter: Payer: Medicaid Other | Admitting: Physical Therapy

## 2017-08-01 ENCOUNTER — Telehealth: Payer: Self-pay | Admitting: Physical Therapy

## 2017-08-01 ENCOUNTER — Ambulatory Visit: Payer: Medicaid Other | Admitting: Physical Therapy

## 2017-08-01 NOTE — Telephone Encounter (Signed)
Patient missed 2nd visit today.  Message left on voicemail of date and time of her next visit.  I asked her to check the attendance policy about missed visits.  I asked her to call to cancel the appointment if she was unable to attend.  Liz Beach PTA

## 2017-08-02 ENCOUNTER — Encounter: Payer: Medicaid Other | Admitting: Physical Therapy

## 2017-08-03 ENCOUNTER — Ambulatory Visit: Payer: Medicaid Other | Admitting: Physical Therapy

## 2017-08-04 ENCOUNTER — Telehealth (INDEPENDENT_AMBULATORY_CARE_PROVIDER_SITE_OTHER): Payer: Self-pay | Admitting: Specialist

## 2017-08-04 NOTE — Telephone Encounter (Signed)
Please advise 

## 2017-08-04 NOTE — Telephone Encounter (Signed)
Patient called needing Rx refilled (hydrocodone) The number to contact patient is [phone]

## 2017-08-07 ENCOUNTER — Other Ambulatory Visit (INDEPENDENT_AMBULATORY_CARE_PROVIDER_SITE_OTHER): Payer: Self-pay | Admitting: Radiology

## 2017-08-07 ENCOUNTER — Ambulatory Visit: Payer: Medicaid Other | Admitting: Physical Therapy

## 2017-08-07 DIAGNOSIS — R200 Anesthesia of skin: Secondary | ICD-10-CM

## 2017-08-07 DIAGNOSIS — M48062 Spinal stenosis, lumbar region with neurogenic claudication: Secondary | ICD-10-CM

## 2017-08-07 DIAGNOSIS — R202 Paresthesia of skin: Secondary | ICD-10-CM

## 2017-08-07 DIAGNOSIS — M5126 Other intervertebral disc displacement, lumbar region: Secondary | ICD-10-CM

## 2017-08-08 ENCOUNTER — Telehealth: Payer: Self-pay | Admitting: Physical Therapy

## 2017-08-08 ENCOUNTER — Ambulatory Visit (INDEPENDENT_AMBULATORY_CARE_PROVIDER_SITE_OTHER): Payer: Medicaid Other | Admitting: Specialist

## 2017-08-08 ENCOUNTER — Encounter (INDEPENDENT_AMBULATORY_CARE_PROVIDER_SITE_OTHER): Payer: Self-pay | Admitting: Specialist

## 2017-08-08 VITALS — BP 154/93 | HR 74 | Ht 66.0 in | Wt 186.0 lb

## 2017-08-08 DIAGNOSIS — M542 Cervicalgia: Secondary | ICD-10-CM

## 2017-08-08 DIAGNOSIS — M47812 Spondylosis without myelopathy or radiculopathy, cervical region: Secondary | ICD-10-CM | POA: Diagnosis not present

## 2017-08-08 DIAGNOSIS — M48062 Spinal stenosis, lumbar region with neurogenic claudication: Secondary | ICD-10-CM

## 2017-08-08 MED ORDER — PREGABALIN 75 MG PO CAPS: 75.0000 mg | ORAL_CAPSULE | Freq: Two times a day (BID) | ORAL | 2 refills | Status: DC

## 2017-08-08 NOTE — Telephone Encounter (Signed)
Left message letting pt know that she has had too many NS per policy and will be able to schedule one apt at a time. Her apt on [address] will be cancelled.  Nakul Avino C. Aleese Kamps PT, DPT 08/08/17 1:28 PM

## 2017-08-08 NOTE — Patient Instructions (Addendum)
Avoid bending, stooping and avoid lifting weights greater than 25 lbs. Avoid prolong standing and walking. Avoid frequent bending and stooping  No lifting greater than 25 lbs. May use ice or moist heat for pain. Weight loss is of benefit. Will try pregabalin 75 mg capsule take one at night for 1 week then one capsule BID. Stop the gabapentin when starting the pregabalin.
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
## 2017-08-09 ENCOUNTER — Encounter: Payer: Medicaid Other | Admitting: Physical Therapy

## 2017-08-10 ENCOUNTER — Ambulatory Visit (INDEPENDENT_AMBULATORY_CARE_PROVIDER_SITE_OTHER): Payer: Medicaid Other | Admitting: Specialist

## 2017-08-10 ENCOUNTER — Encounter: Payer: Medicaid Other | Admitting: Physical Therapy

## 2017-08-17 ENCOUNTER — Other Ambulatory Visit (INDEPENDENT_AMBULATORY_CARE_PROVIDER_SITE_OTHER): Payer: Self-pay | Admitting: Specialist

## 2017-08-17 DIAGNOSIS — M5126 Other intervertebral disc displacement, lumbar region: Secondary | ICD-10-CM

## 2017-08-17 DIAGNOSIS — M5116 Intervertebral disc disorders with radiculopathy, lumbar region: Secondary | ICD-10-CM

## 2017-08-17 DIAGNOSIS — M48062 Spinal stenosis, lumbar region with neurogenic claudication: Secondary | ICD-10-CM

## 2017-08-17 NOTE — Telephone Encounter (Signed)
Ibuprofen refill request.

## 2017-08-18 ENCOUNTER — Ambulatory Visit (INDEPENDENT_AMBULATORY_CARE_PROVIDER_SITE_OTHER): Payer: Self-pay | Admitting: Specialist

## 2017-08-21 NOTE — Telephone Encounter (Signed)
No further narcotics, we are not planning surgery. Lisa Newton

## 2017-09-07 ENCOUNTER — Ambulatory Visit (INDEPENDENT_AMBULATORY_CARE_PROVIDER_SITE_OTHER): Payer: Medicaid Other | Admitting: Specialist

## 2017-09-07 ENCOUNTER — Encounter (INDEPENDENT_AMBULATORY_CARE_PROVIDER_SITE_OTHER): Payer: Self-pay | Admitting: Surgery

## 2017-09-07 VITALS — Ht 66.0 in

## 2017-09-07 DIAGNOSIS — M542 Cervicalgia: Secondary | ICD-10-CM

## 2017-09-07 DIAGNOSIS — M5441 Lumbago with sciatica, right side: Secondary | ICD-10-CM

## 2017-09-07 MED ORDER — TIZANIDINE HCL 4 MG PO TABS: 4.0000 mg | ORAL_TABLET | Freq: Four times a day (QID) | ORAL | 0 refills | Status: DC | PRN

## 2017-09-07 MED ORDER — NABUMETONE 500 MG PO TABS: 500.0000 mg | ORAL_TABLET | Freq: Every day | ORAL | 3 refills | Status: DC
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
## 2017-09-08 ENCOUNTER — Telehealth (INDEPENDENT_AMBULATORY_CARE_PROVIDER_SITE_OTHER): Payer: Self-pay | Admitting: Radiology

## 2017-09-08 ENCOUNTER — Other Ambulatory Visit (INDEPENDENT_AMBULATORY_CARE_PROVIDER_SITE_OTHER): Payer: Self-pay | Admitting: Family

## 2017-09-08 ENCOUNTER — Other Ambulatory Visit (INDEPENDENT_AMBULATORY_CARE_PROVIDER_SITE_OTHER): Payer: Self-pay

## 2017-09-08 MED ORDER — MELOXICAM 15 MG PO TABS: 15.0000 mg | ORAL_TABLET | Freq: Every day | ORAL | 2 refills | Status: DC

## 2017-09-08 NOTE — Telephone Encounter (Signed)
Patient aware this was called in for her  

## 2017-09-08 NOTE — Telephone Encounter (Signed)
Patient is calling states that she was Nabumetone, states that medicaid doesn't cover it and it is $60 and she can't afford that.  She wants to know what else she can do.  She states that she is in a lot of pain.  Please advise.

## 2017-09-12 ENCOUNTER — Ambulatory Visit: Payer: Medicaid Other | Admitting: Internal Medicine

## 2017-09-13 ENCOUNTER — Ambulatory Visit (INDEPENDENT_AMBULATORY_CARE_PROVIDER_SITE_OTHER): Payer: Medicaid Other | Admitting: Family Medicine

## 2017-09-13 ENCOUNTER — Encounter: Payer: Self-pay | Admitting: Family Medicine

## 2017-09-13 ENCOUNTER — Encounter: Payer: Self-pay | Admitting: Licensed Clinical Social Worker

## 2017-09-13 VITALS — BP 180/94 | HR 108 | Temp 98.3°F | Wt 193.2 lb

## 2017-09-13 DIAGNOSIS — M542 Cervicalgia: Secondary | ICD-10-CM

## 2017-09-13 DIAGNOSIS — M549 Dorsalgia, unspecified: Secondary | ICD-10-CM | POA: Diagnosis not present

## 2017-09-13 DIAGNOSIS — Z659 Problem related to unspecified psychosocial circumstances: Secondary | ICD-10-CM

## 2017-09-13 DIAGNOSIS — Z609 Problem related to social environment, unspecified: Secondary | ICD-10-CM

## 2017-09-13 HISTORY — DX: Problem related to unspecified psychosocial circumstances: Z65.9

## 2017-09-13 HISTORY — DX: Problem related to social environment, unspecified: Z60.9

## 2017-09-13 NOTE — Assessment & Plan Note (Addendum)
Significant spinal past medical history including neck pain, cervicalgia, bilateral low back pain with sciatica. Has also had surgery on her back before including left C6-7 foraminotomy for HNP . Follows at [facility] orthopedics and recently seen 1 week ago, was instructed to return around October 11. They recommended a repeat MRI of her cervical spine if she continued to have pain while taking Relafen and Zanaflex. Patient very tearful on exam today and wanted another referral to another spine specialist. Also requesting opioid pain medication. Discussed with patient that I can refer her to another spine specialist but will not be providing her opioid pain medications, patient upset. Given her significant spinal hx, her neck and back pain will need to be managed by a specialist.  - Referred to another neurosurgery office per patient request - Continue Relafen and Zanaflex per ortho recommendations

## 2017-09-13 NOTE — Progress Notes (Signed)
ESTIMATE TIME:15 minutes Type of Service: Integrated Behavioral Health warm handoff  Interpreter:No.   SUBJECTIVE: Lisa Newton is a 46 y.o. female referred by Dr. Jonathon Jordan for: Stressors related to: financial concerns, employment concerns and food insecurities   Patient reports:  Food is not a concern though her stamps maybe reduced due to starting a new job.  Main stress is having money to pay her bills. Patient has applied for disability. 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  Patient will also call financial and food resources provided.   PLAN:  Referral:Community Resources:  Food, Actuary and employment resources,  No further intervention required at this time  Warm Hand Off Completed.     Sammuel Hines, LCSW Licensed Clinical Social Worker [facility]   [phone] 2:07 PM
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
## 2017-09-13 NOTE — Assessment & Plan Note (Signed)
Patient endorsing difficulty affording rent and food at home.  very tearful about this. has tried to work this month because she ran through all of her savings as she did not work from December 2017 until month ago. -Social worker Gavin Pound spoke with patient today and provided some resources and discussed relaxation techniques

## 2017-09-13 NOTE — Patient Instructions (Signed)
Thank you for coming in today, it was so nice to see you! Today we talked about:    Financial stressors: We had you talk to Gavin Pound today, I hope you found this helpful   Back pain: You have a significant back history, I have placed a referral for another spine specialist. Someone will call you to schedule this appt  If you have any questions or concerns, please do not hesitate to call the office at [phone]. You can also message me directly via MyChart.   Sincerely,  Anders Simmonds, MD

## 2017-09-29 ENCOUNTER — Ambulatory Visit: Payer: Medicaid Other | Admitting: Internal Medicine

## 2017-10-10 ENCOUNTER — Ambulatory Visit (INDEPENDENT_AMBULATORY_CARE_PROVIDER_SITE_OTHER): Payer: Medicaid Other | Admitting: Specialist

## 2017-10-10 ENCOUNTER — Encounter (INDEPENDENT_AMBULATORY_CARE_PROVIDER_SITE_OTHER): Payer: Self-pay | Admitting: Specialist

## 2017-10-10 VITALS — BP 125/78 | HR 76 | Ht 66.0 in | Wt 190.0 lb

## 2017-10-10 DIAGNOSIS — M5030 Other cervical disc degeneration, unspecified cervical region: Secondary | ICD-10-CM

## 2017-10-10 DIAGNOSIS — M5136 Other intervertebral disc degeneration, lumbar region: Secondary | ICD-10-CM | POA: Diagnosis not present
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
## 2017-10-10 NOTE — Patient Instructions (Addendum)
Plan: Avoid frequent bending and stooping  No lifting greater than 10 lbs. May use ice or moist heat for pain. Weight loss is of benefit.  Avoid overhead lifting and overhead use of the arms. Do not lift greater than 10 lbs. Will try to obtain second opinion for her regards to her lumbar spine, Dr.Dumonski.

## 2017-10-16 ENCOUNTER — Other Ambulatory Visit (INDEPENDENT_AMBULATORY_CARE_PROVIDER_SITE_OTHER): Payer: Self-pay | Admitting: Specialist

## 2017-10-16 NOTE — Telephone Encounter (Signed)
Ibuprofen refill request.

## 2017-10-24 ENCOUNTER — Other Ambulatory Visit (INDEPENDENT_AMBULATORY_CARE_PROVIDER_SITE_OTHER): Payer: Self-pay | Admitting: Specialist

## 2017-10-25 NOTE — Telephone Encounter (Signed)
Tizanidine Refill Request

## 2017-11-09 ENCOUNTER — Other Ambulatory Visit: Payer: Self-pay | Admitting: Neurosurgery

## 2017-11-09 DIAGNOSIS — M5126 Other intervertebral disc displacement, lumbar region: Secondary | ICD-10-CM

## 2017-11-10 ENCOUNTER — Telehealth (INDEPENDENT_AMBULATORY_CARE_PROVIDER_SITE_OTHER): Payer: Self-pay | Admitting: Specialist

## 2017-11-10 ENCOUNTER — Other Ambulatory Visit: Payer: Self-pay | Admitting: Family Medicine

## 2017-11-10 NOTE — Telephone Encounter (Signed)
Patient called asking for a refill on prednisone.

## 2017-11-10 NOTE — Telephone Encounter (Signed)
Patient called asking for a refill on prednisone. CB # [phone]

## 2017-11-10 NOTE — Telephone Encounter (Signed)
Will forward to MD to advise. Jazmin Hartsell,CMA  

## 2017-11-10 NOTE — Telephone Encounter (Signed)
Patient informed and states "she is not a good doctor because I was told as my family doctor she should write my prescriptions."  Patient asked me to make sure this was documented in her note.  Jazmin Hartsell,CMA

## 2017-11-10 NOTE — Telephone Encounter (Signed)
Hello blue team,  Please advise patient. I never prescribe her prednisone. It might have been her orthopedic. If that is the case and it is for back pain, she will need to contact them directly.

## 2017-11-10 NOTE — Telephone Encounter (Signed)
That's okay. I only write prescription when appropriate. She can come see me soon for assessment, but will be best to see orthopedic since they are managing her pain.

## 2017-11-10 NOTE — Telephone Encounter (Signed)
Would like to get predinsone refilled.  She is having extreme pain in her back. She is having another back surgery soon. Her MRI is Wednesday. [facility] on [address]

## 2017-11-11 ENCOUNTER — Other Ambulatory Visit: Payer: Self-pay

## 2017-11-11 ENCOUNTER — Emergency Department (HOSPITAL_COMMUNITY)
Admission: EM | Admit: 2017-11-11 | Discharge: 2017-11-11 | Disposition: A | Discharge status: Home / Self Care | Payer: Medicaid Other | Attending: Emergency Medicine | Admitting: Emergency Medicine

## 2017-11-11 ENCOUNTER — Emergency Department (HOSPITAL_BASED_OUTPATIENT_CLINIC_OR_DEPARTMENT_OTHER)
Admission: EM | Admit: 2017-11-11 | Discharge: 2017-11-11 | Disposition: A | Discharge status: Home / Self Care | Payer: Medicaid Other | Source: Home / Self Care

## 2017-11-11 ENCOUNTER — Encounter (HOSPITAL_COMMUNITY): Payer: Self-pay | Admitting: Nurse Practitioner

## 2017-11-11 DIAGNOSIS — I10 Essential (primary) hypertension: Secondary | ICD-10-CM | POA: Insufficient documentation

## 2017-11-11 DIAGNOSIS — Z91040 Latex allergy status: Secondary | ICD-10-CM | POA: Diagnosis not present

## 2017-11-11 DIAGNOSIS — M7989 Other specified soft tissue disorders: Secondary | ICD-10-CM

## 2017-11-11 DIAGNOSIS — R2241 Localized swelling, mass and lump, right lower limb: Secondary | ICD-10-CM | POA: Insufficient documentation

## 2017-11-11 DIAGNOSIS — M79609 Pain in unspecified limb: Secondary | ICD-10-CM | POA: Diagnosis not present

## 2017-11-11 DIAGNOSIS — Z79899 Other long term (current) drug therapy: Secondary | ICD-10-CM | POA: Insufficient documentation

## 2017-11-11 DIAGNOSIS — M79604 Pain in right leg: Secondary | ICD-10-CM | POA: Insufficient documentation

## 2017-11-11 MED ORDER — OXYCODONE HCL 5 MG PO TABS
10.0000 mg | ORAL_TABLET | Freq: Once | ORAL | Status: AC
  Administered 2017-11-11: 10 mg via ORAL
  Filled 2017-11-11: qty 2

## 2017-11-11 MED ORDER — KETOROLAC TROMETHAMINE 60 MG/2ML IM SOLN
30.0000 mg | Freq: Once | INTRAMUSCULAR | Status: AC
  Administered 2017-11-11: 30 mg via INTRAMUSCULAR
  Filled 2017-11-11: qty 2

## 2017-11-11 MED ORDER — PREDNISONE 20 MG PO TABS: ORAL_TABLET | ORAL | 0 refills | Status: DC

## 2017-11-11 MED ORDER — PREDNISONE 20 MG PO TABS
60.0000 mg | ORAL_TABLET | Freq: Once | ORAL | Status: AC
  Administered 2017-11-11: 60 mg via ORAL
  Filled 2017-11-11: qty 3

## 2017-11-11 NOTE — ED Triage Notes (Signed)
Patient states she began having pain the week before Thanksgiving in her right calf/leg area. Reports swelling and feeling like there is a knot in leg. Patient reports over the past few days it has become harder to walk and the pain has become unbearable. She reports taking Oxycodone at 11pm which is prescribed for herniated disc. Patient states pain radiates from back of thigh area all down to foot. She reports having an MRI scheduled Wed for back. She states PCP thinks leg pain is from back but she doesn't feel like it is related.

## 2017-11-11 NOTE — Progress Notes (Signed)
Right lower extremity venous duplex has been completed. Negative for DVT. Results were given to Renne CriglerJoshua Geiple PA.  11/11/17 4:10 PM Olen CordialGreg Ashutosh Dieguez RVT

## 2017-11-11 NOTE — Discharge Instructions (Signed)
Please read and follow all provided instructions.  Your diagnoses today include:  1. Right leg pain     Tests performed today include:  Vital signs - see below for your results today  Ultrasound of your leg that did not show any blood clots  Medications prescribed:   Prednisone - steroid medicine   It is best to take this medication in the morning to prevent sleeping problems. If you are diabetic, monitor your blood sugar closely and stop taking Prednisone if blood sugar is over 300. Take with food to prevent stomach upset.   Take any prescribed medications only as directed.  Home care instructions:   Follow any educational materials contained in this packet  Please rest, use ice or heat on your back for the next several days  Do not lift, push, pull anything more than 10 pounds for the next week  Follow-up instructions: Please follow-up with your orthopedic doctor as planned.  Return instructions:  SEEK IMMEDIATE MEDICAL ATTENTION IF YOU HAVE:  New numbness, tingling, weakness, or problem with the use of your arms or legs  Severe back pain not relieved with medications  Loss control of your bowels or bladder  Increasing pain in any areas of the body (such as chest or abdominal pain)  Shortness of breath, dizziness, or fainting.   Worsening nausea (feeling sick to your stomach), vomiting, fever, or sweats  Any other emergent concerns regarding your health   Additional Information:  Your vital signs today were: BP (!) 155/83    Pulse 76    Temp 98.2 F (36.8 C) (Oral)    Resp 18    Ht 5\' 4"  (1.626 m)    Wt 89.8 kg (198 lb)    LMP 11/11/2017    SpO2 98%    BMI 33.99 kg/m  If your blood pressure (BP) was elevated above 135/85 this visit, please have this repeated by your doctor within one month. --------------
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
## 2017-11-13 ENCOUNTER — Encounter: Payer: Self-pay | Admitting: Family Medicine

## 2017-11-13 DIAGNOSIS — M5116 Intervertebral disc disorders with radiculopathy, lumbar region: Secondary | ICD-10-CM

## 2017-11-13 NOTE — Patient Instructions (Signed)
Patient was recently referred to Neurosurg for lumbar disc herniation with radiculopathic pain. I received visit note from Dr. Franky Machoabbell [facility]( [facility]). He mentioned that patient discussed her inability to obtain opioid from our clinic. It is not clear if Dr. Franky Machoabbell is aware that she did not fully comply with our pain medicine policy. She had positive cocaine test at her pain clinic which I discussed with her and I requested repeat Utox screening which she refused. During her visit in Jan, I discussed with her that since she is not complying with management plan,I will not be able to give opioid prescription. She will benefit from Gabapentin and NSAID. I will refer again to another pain clinic since she does not want to go back to her previous pain clinic.  See note below.       Assessment & Plan Note by Doreene ElandEniola, Susen Haskew T, MD at 12/23/2016 2:17 PM   Author: Doreene ElandEniola, Mirah Nevins T, MD Author Type: Physician Filed: 12/23/2016 2:21 PM Note Status: Written Cosign: Cosign Not Required Encounter Date: 12/23/2016 Problem: Cervical radiculopathy Editor: Doreene ElandEniola, Minaal Struckman T, MD (Physician)    New diagnosis. MRI cervical spine reviewed. She already established care with spine specialist. She was also seen recently by pain specialist at [facility]. Utox was done at the pain clinic suggestive of cocaine positivity. This was discussed with patient and I requested repeat test. She refused to give her urine today despite giving her water to drink and water for almost 30 min. I agree with the pain specialist's documentation that she is at risk for opioid abuse.

## 2017-11-15 ENCOUNTER — Ambulatory Visit
Admission: RE | Admit: 2017-11-15 | Discharge: 2017-11-15 | Disposition: A | Discharge status: Home / Self Care | Payer: Medicaid Other | Source: Ambulatory Visit | Attending: Neurosurgery | Admitting: Neurosurgery

## 2017-11-15 DIAGNOSIS — M5126 Other intervertebral disc displacement, lumbar region: Secondary | ICD-10-CM

## 2017-11-16 ENCOUNTER — Encounter (HOSPITAL_COMMUNITY): Payer: Self-pay

## 2017-11-16 ENCOUNTER — Other Ambulatory Visit: Payer: Self-pay

## 2017-11-16 ENCOUNTER — Emergency Department (HOSPITAL_COMMUNITY)
Admission: EM | Admit: 2017-11-16 | Discharge: 2017-11-17 | Disposition: A | Discharge status: Home / Self Care | Payer: Medicaid Other | Attending: Emergency Medicine | Admitting: Emergency Medicine

## 2017-11-16 DIAGNOSIS — Z79899 Other long term (current) drug therapy: Secondary | ICD-10-CM | POA: Insufficient documentation

## 2017-11-16 DIAGNOSIS — M5441 Lumbago with sciatica, right side: Secondary | ICD-10-CM | POA: Insufficient documentation

## 2017-11-16 DIAGNOSIS — I10 Essential (primary) hypertension: Secondary | ICD-10-CM | POA: Insufficient documentation

## 2017-11-16 DIAGNOSIS — Z91040 Latex allergy status: Secondary | ICD-10-CM | POA: Insufficient documentation

## 2017-11-16 DIAGNOSIS — G8929 Other chronic pain: Secondary | ICD-10-CM | POA: Diagnosis not present

## 2017-11-16 DIAGNOSIS — M545 Low back pain: Secondary | ICD-10-CM | POA: Diagnosis present

## 2017-11-16 MED ORDER — OXYCODONE HCL 5 MG PO TABS
10.0000 mg | ORAL_TABLET | Freq: Once | ORAL | Status: AC
  Administered 2017-11-16: 10 mg via ORAL
  Filled 2017-11-16: qty 2

## 2017-11-16 MED ORDER — KETOROLAC TROMETHAMINE 60 MG/2ML IM SOLN
15.0000 mg | Freq: Once | INTRAMUSCULAR | Status: AC
  Administered 2017-11-16: 15 mg via INTRAMUSCULAR
  Filled 2017-11-16: qty 2

## 2017-11-16 MED ORDER — PREDNISONE 20 MG PO TABS: ORAL_TABLET | ORAL | 0 refills | Status: DC

## 2017-11-16 MED ORDER — ACETAMINOPHEN 500 MG PO TABS
1000.0000 mg | ORAL_TABLET | Freq: Once | ORAL | Status: AC
  Administered 2017-11-16: 1000 mg via ORAL
  Filled 2017-11-16: qty 2

## 2017-11-16 MED ORDER — DIAZEPAM 5 MG PO TABS
5.0000 mg | ORAL_TABLET | Freq: Once | ORAL | Status: AC
  Administered 2017-11-16: 5 mg via ORAL
  Filled 2017-11-16: qty 1

## 2017-11-16 NOTE — Discharge Instructions (Signed)
This was the read of your MRI:  There does not appear to be any change since the previous study. There is a small right posterolateral disc herniation at L4-5 the does appear to cause right lateral recess stenosis and potential compression of the right L5 nerve.  Please call your spinal doc, pcp and pain management to let them know that your pain is uncontrolled.   Take 4 over the counter ibuprofen tablets 3 times a day or 2 over-the-counter naproxen tablets twice a day for pain. Ask your pain management doc about taking the max tylenol dose prior to using narcotics.   Then take the pain medicine if you feel like you need it. Narcotics do not help with the pain, they only make you care about it less.  You can become addicted to this, people may break into your house to steal it.  It will constipate you.  If you drive under the influence of this medicine you can get a DUI.

## 2017-11-16 NOTE — ED Notes (Signed)
Patient refused vital signs and to sign her discharge instructions because she stated, "I am leaving the same way I came in and I ain't signing nothing because I don't agree with the discharge instructions."
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
## 2017-11-16 NOTE — ED Notes (Signed)
Patient states, "I am eating and crying at the same time." Patient eating [employer].

## 2017-11-16 NOTE — ED Triage Notes (Addendum)
Pt BIB [facility] c/o back pain that shoots down her R leg. She is unable to ambulate at this time d/t pain. She was seen Sunday for same. Pt had an MRI yesterday, but states that her doctor wont give her her results. She states that she is suffering and unable to sleep or eat. She reports that "this is not about pain pills, she just and to feel better." A&Ox4.

## 2017-11-17 ENCOUNTER — Telehealth: Payer: Self-pay | Admitting: Family Medicine

## 2017-11-17 ENCOUNTER — Ambulatory Visit: Payer: Medicaid Other | Admitting: Family Medicine

## 2017-11-17 MED ORDER — TRAMADOL HCL 50 MG PO TABS: 50.0000 mg | ORAL_TABLET | Freq: Two times a day (BID) | ORAL | 0 refills | Status: DC | PRN

## 2017-11-17 MED ORDER — HYDROXYZINE PAMOATE 25 MG PO CAPS: 25.0000 mg | ORAL_CAPSULE | Freq: Three times a day (TID) | ORAL | 0 refills | Status: DC | PRN

## 2017-11-17 NOTE — Telephone Encounter (Signed)
Pt had appt scheduled for 11:10 today but cannot come in because of no transportation.  She has been back and forth to ED because of her back pain.  She says she cannot walk.  She was told dr Harvel Qualeeniolal was the only one that could prescribe pain meds. She would like to talk to Dr Lum Babeeniola about this

## 2017-11-17 NOTE — Telephone Encounter (Signed)
I called and spoke with patient. She stated she is having back pain radiating to her leg. She is out of pain medication and she need refill. Her orthopedic surg was not going to give refill. She was seen by a new doctor/neurosurg who gave her few days supply of pain medicine.  I explained to her again that per our clinic policy, due to positive cocaine in the urine and her refusal to repeat the test during her subsequent clinic visit we will be unable to prescribe opioid to her. I recommended Lyrica/Gabapentin since this is a radiculopathic pain. She stated she is already on Gabapntin 300 mg daily and that she is not on Lyrica. I advised she can take Gabapentin 300 BID instead.  Her next visit to her neurosurg is Dec 28th. I agreed to give Tramadol few days supply till her appointment. However, I will not continue to refill this for her either. She had mentioned recently that I am a bad doctor for not giving her all she requested for. I again explained to her that I can only prescribe what is recommended and evidence based. She verbalized understanding and agreed with plan. She is to follow-up soon with me for other health problem management. She requested refill of her Hydroxyzine as well which I escribed to her pharmacy.

## 2017-11-17 NOTE — ED Notes (Signed)
This writer discharged chart; chart never discharged at time of discharge.

## 2017-11-24 ENCOUNTER — Encounter: Payer: Self-pay | Admitting: Family Medicine

## 2017-11-24 ENCOUNTER — Ambulatory Visit: Payer: Medicaid Other | Admitting: Family Medicine

## 2017-11-24 DIAGNOSIS — Z86718 Personal history of other venous thrombosis and embolism: Secondary | ICD-10-CM | POA: Insufficient documentation

## 2017-11-27 ENCOUNTER — Other Ambulatory Visit: Payer: Self-pay | Admitting: *Deleted

## 2017-11-27 MED ORDER — IBUPROFEN 600 MG PO TABS: 600.0000 mg | ORAL_TABLET | Freq: Three times a day (TID) | ORAL | 0 refills | Status: DC | PRN

## 2017-11-28 ENCOUNTER — Other Ambulatory Visit: Payer: Self-pay | Admitting: Neurosurgery

## 2017-12-08 ENCOUNTER — Other Ambulatory Visit: Payer: Self-pay

## 2017-12-08 ENCOUNTER — Encounter (HOSPITAL_COMMUNITY)
Admission: RE | Admit: 2017-12-08 | Discharge: 2017-12-08 | Disposition: A | Discharge status: Home / Self Care | Payer: Medicaid Other | Source: Ambulatory Visit | Attending: Neurosurgery | Admitting: Neurosurgery

## 2017-12-08 ENCOUNTER — Encounter (HOSPITAL_COMMUNITY): Payer: Self-pay

## 2017-12-08 DIAGNOSIS — Z01810 Encounter for preprocedural cardiovascular examination: Secondary | ICD-10-CM | POA: Insufficient documentation

## 2017-12-08 DIAGNOSIS — I10 Essential (primary) hypertension: Secondary | ICD-10-CM | POA: Insufficient documentation

## 2017-12-08 DIAGNOSIS — F329 Major depressive disorder, single episode, unspecified: Secondary | ICD-10-CM | POA: Diagnosis not present

## 2017-12-08 DIAGNOSIS — Z01812 Encounter for preprocedural laboratory examination: Secondary | ICD-10-CM | POA: Insufficient documentation

## 2017-12-08 DIAGNOSIS — F419 Anxiety disorder, unspecified: Secondary | ICD-10-CM | POA: Diagnosis not present

## 2017-12-08 DIAGNOSIS — M519 Unspecified thoracic, thoracolumbar and lumbosacral intervertebral disc disorder: Secondary | ICD-10-CM | POA: Diagnosis not present

## 2017-12-08 DIAGNOSIS — M1990 Unspecified osteoarthritis, unspecified site: Secondary | ICD-10-CM | POA: Insufficient documentation

## 2017-12-08 HISTORY — DX: Nausea with vomiting, unspecified: R11.2

## 2017-12-08 HISTORY — DX: Other specified postprocedural states: Z98.890

## 2017-12-08 HISTORY — DX: Other specified postprocedural states: R11.2

## 2017-12-08 LAB — BASIC METABOLIC PANEL
ANION GAP: 12 (ref 5–15)
BUN: 10 mg/dL (ref 6–20)
CALCIUM: 9.5 mg/dL (ref 8.9–10.3)
CHLORIDE: 102 mmol/L (ref 101–111)
CO2: 21 mmol/L — AB (ref 22–32)
CREATININE: 0.89 mg/dL (ref 0.44–1.00)
GFR calc non Af Amer: >60 mL/min (ref >60)
Glucose, Bld: 111 mg/dL — ABNORMAL HIGH (ref 65–99)
Potassium: 3.4 mmol/L — ABNORMAL LOW (ref 3.5–5.1)
SODIUM: 135 mmol/L (ref 135–145)

## 2017-12-08 LAB — CBC
HEMATOCRIT: 40.9 % (ref 36.0–46.0)
HEMOGLOBIN: 13.9 g/dL (ref 12.0–15.0)
MCH: 31 pg (ref 26.0–34.0)
MCHC: 34 g/dL (ref 30.0–36.0)
MCV: 91.1 fL (ref 78.0–100.0)
Platelets: 297 10*3/uL (ref 150–400)
RBC: 4.49 MIL/uL (ref 3.87–5.11)
RDW: 13.5 % (ref 11.5–15.5)
WBC: 13.3 10*3/uL — AB (ref 4.0–10.5)

## 2017-12-08 LAB — SURGICAL PCR SCREEN
MRSA, PCR: NEGATIVE
STAPHYLOCOCCUS AUREUS: NEGATIVE

## 2017-12-08 LAB — HCG, SERUM, QUALITATIVE: PREG SERUM: NEGATIVE

## 2017-12-08 MED ORDER — CHLORHEXIDINE GLUCONATE CLOTH 2 % EX PADS: 6.0000 | MEDICATED_PAD | Freq: Once | CUTANEOUS | Status: DC

## 2017-12-08 NOTE — Progress Notes (Signed)
PCP:  Cardiologist:  EKG:  Stress test:  ECHO:  Cardiac Cath:  Chest x-ray 

## 2017-12-08 NOTE — Pre-Procedure Instructions (Signed)
Olga CoasterLatonya Cousin  12/08/2017      [id number] [place]- [place], [place] - [address] [place] [id number] Phone: [phone] Fax: [phone]    Your procedure is scheduled on December 14, 2017.  [facility] at 1100 AM.  Call this number if you have problems the morning of surgery:  [phone]   Remember:  Do not eat food or drink liquids after midnight.  Take these medicines the morning of surgery with A SIP OF WATER acetaminophen (tylenol), gabapentin (neurontin), hydroxyzine (vistaril)-if needed.   7 days prior to surgery STOP taking any nabumetone (relafen), Aspirin (unless otherwise instructed by your surgeon), Aleve, Naproxen, Ibuprofen, Motrin, Advil, Goody's, BC's, all herbal medications, fish oil, and all vitamins  Continue all other medications as instructed by your physician except follow the above medication instructions before surgery   Do not wear jewelry, make-up or nail polish.  Do not wear lotions, powders, or perfumes, or deodorant.  Do not shave 48 hours prior to surgery.   Do not bring valuables to the hospital.  [facility] is not responsible for any belongings or valuables.  Contacts, dentures or bridgework may not be worn into surgery.  Leave your suitcase in the car.  After surgery it may be brought to your room.  For patients admitted to the hospital, discharge time will be determined by your treatment team.  Patients discharged the day of surgery will not be allowed to drive home.   Special instructions:  [facility]- Preparing For Surgery  Before surgery, you can play an important role. Because skin is not sterile, your skin needs to be as free of germs as possible. You can reduce the number of germs on your skin by washing with CHG (chlorahexidine gluconate) Soap before surgery.  CHG is an antiseptic cleaner which kills germs and bonds with the skin to continue killing germs even after  washing.  Please do not use if you have an allergy to CHG or antibacterial soaps. If your skin becomes reddened/irritated stop using the CHG.  Do not shave (including legs and underarms) for at least 48 hours prior to first CHG shower. It is OK to shave your face.  Please follow these instructions carefully.   1. Shower the NIGHT BEFORE SURGERY and the MORNING OF SURGERY with CHG.   2. If you chose to wash your hair, wash your hair first as usual with your normal shampoo.  3. After you shampoo, rinse your hair and body thoroughly to remove the shampoo.  4. Use CHG as you would any other liquid soap. You can apply CHG directly to the skin and wash gently with a scrungie or a clean washcloth.   5. Apply the CHG Soap to your body ONLY FROM THE NECK DOWN.  Do not use on open wounds or open sores. Avoid contact with your eyes, ears, mouth and genitals (private parts). Wash Face and genitals (private parts)  with your normal soap.  6. Wash thoroughly, paying special attention to the area where your surgery will be performed.  7. Thoroughly rinse your body with warm water from the neck down.  8. DO NOT shower/wash with your normal soap after using and rinsing off the CHG Soap.  9. Pat yourself dry with a CLEAN TOWEL.  10. Wear CLEAN PAJAMAS to bed the night before surgery, wear comfortable clothes the morning of surgery  11. Place CLEAN SHEETS on your bed the night of your  first shower and DO NOT SLEEP WITH PETS.   Day of Surgery: Do not apply any deodorants/lotions. Please wear clean clothes to the hospital/surgery center.     Please read over the following fact sheets that you were given. Pain Booklet, Coughing and Deep Breathing, MRSA Information and Surgical Site Infection Prevention

## 2017-12-13 MED ORDER — VANCOMYCIN HCL IN DEXTROSE 1-5 GM/200ML-% IV SOLN
1000.0000 mg | INTRAVENOUS | Status: AC
  Administered 2017-12-14: 1000 mg via INTRAVENOUS
  Filled 2017-12-13: qty 200

## 2017-12-14 ENCOUNTER — Ambulatory Visit (HOSPITAL_COMMUNITY)
Admission: RE | Admit: 2017-12-14 | Discharge: 2017-12-15 | Disposition: A | Discharge status: Home / Self Care | Payer: Medicaid Other | Source: Ambulatory Visit | Attending: Neurosurgery | Admitting: Neurosurgery

## 2017-12-14 ENCOUNTER — Ambulatory Visit (HOSPITAL_COMMUNITY): Payer: Medicaid Other

## 2017-12-14 ENCOUNTER — Encounter (HOSPITAL_COMMUNITY): Payer: Self-pay

## 2017-12-14 ENCOUNTER — Ambulatory Visit (HOSPITAL_COMMUNITY): Payer: Medicaid Other | Admitting: Certified Registered Nurse Anesthetist

## 2017-12-14 ENCOUNTER — Encounter (HOSPITAL_COMMUNITY)
Admission: RE | Disposition: A | Discharge status: Home / Self Care | Payer: Self-pay | Source: Ambulatory Visit | Attending: Neurosurgery

## 2017-12-14 DIAGNOSIS — R0602 Shortness of breath: Secondary | ICD-10-CM | POA: Insufficient documentation

## 2017-12-14 DIAGNOSIS — F329 Major depressive disorder, single episode, unspecified: Secondary | ICD-10-CM | POA: Diagnosis not present

## 2017-12-14 DIAGNOSIS — M5126 Other intervertebral disc displacement, lumbar region: Secondary | ICD-10-CM | POA: Diagnosis present

## 2017-12-14 DIAGNOSIS — Z888 Allergy status to other drugs, medicaments and biological substances status: Secondary | ICD-10-CM | POA: Diagnosis not present

## 2017-12-14 DIAGNOSIS — F419 Anxiety disorder, unspecified: Secondary | ICD-10-CM | POA: Insufficient documentation

## 2017-12-14 DIAGNOSIS — H9319 Tinnitus, unspecified ear: Secondary | ICD-10-CM | POA: Insufficient documentation

## 2017-12-14 DIAGNOSIS — H9190 Unspecified hearing loss, unspecified ear: Secondary | ICD-10-CM | POA: Insufficient documentation

## 2017-12-14 DIAGNOSIS — Z880 Allergy status to penicillin: Secondary | ICD-10-CM | POA: Insufficient documentation

## 2017-12-14 DIAGNOSIS — Z79899 Other long term (current) drug therapy: Secondary | ICD-10-CM | POA: Insufficient documentation

## 2017-12-14 DIAGNOSIS — M6281 Muscle weakness (generalized): Secondary | ICD-10-CM | POA: Insufficient documentation

## 2017-12-14 DIAGNOSIS — I10 Essential (primary) hypertension: Secondary | ICD-10-CM | POA: Diagnosis not present

## 2017-12-14 DIAGNOSIS — M1990 Unspecified osteoarthritis, unspecified site: Secondary | ICD-10-CM | POA: Insufficient documentation

## 2017-12-14 DIAGNOSIS — Z91040 Latex allergy status: Secondary | ICD-10-CM | POA: Diagnosis not present

## 2017-12-14 DIAGNOSIS — Z885 Allergy status to narcotic agent status: Secondary | ICD-10-CM | POA: Diagnosis not present

## 2017-12-14 DIAGNOSIS — Z882 Allergy status to sulfonamides status: Secondary | ICD-10-CM | POA: Insufficient documentation

## 2017-12-14 DIAGNOSIS — G4700 Insomnia, unspecified: Secondary | ICD-10-CM | POA: Insufficient documentation

## 2017-12-14 DIAGNOSIS — Z419 Encounter for procedure for purposes other than remedying health state, unspecified: Secondary | ICD-10-CM

## 2017-12-14 DIAGNOSIS — M542 Cervicalgia: Secondary | ICD-10-CM | POA: Diagnosis not present

## 2017-12-14 HISTORY — PX: LUMBAR LAMINECTOMY/DECOMPRESSION MICRODISCECTOMY: SHX5026

## 2017-12-14 SURGERY — LUMBAR LAMINECTOMY/DECOMPRESSION MICRODISCECTOMY 1 LEVEL
Anesthesia: General | Site: Spine Lumbar | Laterality: Right

## 2017-12-14 MED ORDER — KETOROLAC TROMETHAMINE 15 MG/ML IJ SOLN
7.5000 mg | Freq: Four times a day (QID) | INTRAMUSCULAR | Status: DC
  Administered 2017-12-14 – 2017-12-15 (×3): 7.5 mg via INTRAVENOUS
  Filled 2017-12-14 (×3): qty 1

## 2017-12-14 MED ORDER — MIDAZOLAM HCL 2 MG/2ML IJ SOLN
INTRAMUSCULAR | Status: AC
  Filled 2017-12-14: qty 2

## 2017-12-14 MED ORDER — LIDOCAINE-EPINEPHRINE 0.5 %-1:200000 IJ SOLN
INTRAMUSCULAR | Status: AC
  Filled 2017-12-14: qty 1

## 2017-12-14 MED ORDER — MORPHINE SULFATE (PF) 4 MG/ML IV SOLN: 2.0000 mg | INTRAVENOUS | Status: DC | PRN

## 2017-12-14 MED ORDER — ONDANSETRON HCL 4 MG/2ML IJ SOLN: 4.0000 mg | Freq: Four times a day (QID) | INTRAMUSCULAR | Status: DC | PRN

## 2017-12-14 MED ORDER — LIDOCAINE 2% (20 MG/ML) 5 ML SYRINGE
INTRAMUSCULAR | Status: DC | PRN
  Administered 2017-12-14: 100 mg via INTRAVENOUS

## 2017-12-14 MED ORDER — SODIUM CHLORIDE 0.9% FLUSH: 3.0000 mL | Freq: Two times a day (BID) | INTRAVENOUS | Status: DC

## 2017-12-14 MED ORDER — MENTHOL 3 MG MT LOZG: 1.0000 | LOZENGE | OROMUCOSAL | Status: DC | PRN

## 2017-12-14 MED ORDER — DULOXETINE HCL 30 MG PO CPEP
60.0000 mg | ORAL_CAPSULE | Freq: Every day | ORAL | Status: DC
  Administered 2017-12-15: 60 mg via ORAL
  Filled 2017-12-14: qty 2

## 2017-12-14 MED ORDER — HYDROCHLOROTHIAZIDE 12.5 MG PO CAPS
12.5000 mg | ORAL_CAPSULE | Freq: Every day | ORAL | Status: DC
  Administered 2017-12-14 – 2017-12-15 (×2): 12.5 mg via ORAL
  Filled 2017-12-14 (×2): qty 1

## 2017-12-14 MED ORDER — ACETAMINOPHEN 650 MG RE SUPP: 650.0000 mg | RECTAL | Status: DC | PRN

## 2017-12-14 MED ORDER — ATORVASTATIN CALCIUM 20 MG PO TABS
20.0000 mg | ORAL_TABLET | Freq: Every day | ORAL | Status: DC
  Administered 2017-12-14: 20 mg via ORAL
  Filled 2017-12-14: qty 1

## 2017-12-14 MED ORDER — OXYCODONE HCL 5 MG PO TABS: 5.0000 mg | ORAL_TABLET | ORAL | Status: DC | PRN

## 2017-12-14 MED ORDER — ZOLPIDEM TARTRATE 5 MG PO TABS
5.0000 mg | ORAL_TABLET | Freq: Every evening | ORAL | Status: DC | PRN
  Administered 2017-12-14: 5 mg via ORAL
  Filled 2017-12-14: qty 1

## 2017-12-14 MED ORDER — SENNOSIDES-DOCUSATE SODIUM 8.6-50 MG PO TABS: 1.0000 | ORAL_TABLET | Freq: Every evening | ORAL | Status: DC | PRN

## 2017-12-14 MED ORDER — 0.9 % SODIUM CHLORIDE (POUR BTL) OPTIME
TOPICAL | Status: DC | PRN
  Administered 2017-12-14: 1000 mL

## 2017-12-14 MED ORDER — SUGAMMADEX SODIUM 200 MG/2ML IV SOLN
INTRAVENOUS | Status: AC
  Filled 2017-12-14: qty 2

## 2017-12-14 MED ORDER — ONDANSETRON HCL 4 MG/2ML IJ SOLN
INTRAMUSCULAR | Status: DC | PRN
  Administered 2017-12-14: 4 mg via INTRAVENOUS

## 2017-12-14 MED ORDER — SUGAMMADEX SODIUM 200 MG/2ML IV SOLN
INTRAVENOUS | Status: DC | PRN
  Administered 2017-12-14: 200 mg via INTRAVENOUS

## 2017-12-14 MED ORDER — SODIUM CHLORIDE 0.9 % IV SOLN: 250.0000 mL | INTRAVENOUS | Status: DC

## 2017-12-14 MED ORDER — PROPOFOL 10 MG/ML IV BOLUS
INTRAVENOUS | Status: AC
  Filled 2017-12-14: qty 20

## 2017-12-14 MED ORDER — PROPOFOL 10 MG/ML IV BOLUS
INTRAVENOUS | Status: DC | PRN
  Administered 2017-12-14: 180 mg via INTRAVENOUS

## 2017-12-14 MED ORDER — THROMBIN (RECOMBINANT) 5000 UNITS EX SOLR
CUTANEOUS | Status: AC
  Filled 2017-12-14: qty 10000

## 2017-12-14 MED ORDER — HEMOSTATIC AGENTS (NO CHARGE) OPTIME
TOPICAL | Status: DC | PRN
  Administered 2017-12-14: 1 via TOPICAL

## 2017-12-14 MED ORDER — LIDOCAINE-EPINEPHRINE 0.5 %-1:200000 IJ SOLN
INTRAMUSCULAR | Status: DC | PRN
  Administered 2017-12-14: 10 mL
  Administered 2017-12-14: 35 mL

## 2017-12-14 MED ORDER — TIZANIDINE HCL 4 MG PO TABS
4.0000 mg | ORAL_TABLET | Freq: Four times a day (QID) | ORAL | Status: DC | PRN
  Administered 2017-12-14: 4 mg via ORAL
  Filled 2017-12-14: qty 1

## 2017-12-14 MED ORDER — PREGABALIN 75 MG PO CAPS
75.0000 mg | ORAL_CAPSULE | Freq: Two times a day (BID) | ORAL | Status: DC
  Administered 2017-12-14 – 2017-12-15 (×2): 75 mg via ORAL
  Filled 2017-12-14 (×2): qty 1

## 2017-12-14 MED ORDER — PHENOL 1.4 % MT LIQD: 1.0000 | OROMUCOSAL | Status: DC | PRN

## 2017-12-14 MED ORDER — PHENYLEPHRINE 40 MCG/ML (10ML) SYRINGE FOR IV PUSH (FOR BLOOD PRESSURE SUPPORT)
PREFILLED_SYRINGE | INTRAVENOUS | Status: AC
  Filled 2017-12-14: qty 20

## 2017-12-14 MED ORDER — FENTANYL CITRATE (PF) 100 MCG/2ML IJ SOLN
INTRAMUSCULAR | Status: DC | PRN
  Administered 2017-12-14: 50 ug via INTRAVENOUS
  Administered 2017-12-14 (×2): 100 ug via INTRAVENOUS

## 2017-12-14 MED ORDER — HYDROXYZINE PAMOATE 25 MG PO CAPS
25.0000 mg | ORAL_CAPSULE | Freq: Three times a day (TID) | ORAL | Status: DC | PRN
  Filled 2017-12-14: qty 1

## 2017-12-14 MED ORDER — HYDROMORPHONE HCL 1 MG/ML IJ SOLN
INTRAMUSCULAR | Status: AC
  Filled 2017-12-14: qty 1

## 2017-12-14 MED ORDER — OXYCODONE HCL 5 MG PO TABS: 5.0000 mg | ORAL_TABLET | Freq: Once | ORAL | Status: DC | PRN

## 2017-12-14 MED ORDER — DEXAMETHASONE SODIUM PHOSPHATE 4 MG/ML IJ SOLN
INTRAMUSCULAR | Status: DC | PRN
  Administered 2017-12-14: 10 mg via INTRAVENOUS

## 2017-12-14 MED ORDER — SODIUM CHLORIDE 0.9% FLUSH: 3.0000 mL | INTRAVENOUS | Status: DC | PRN

## 2017-12-14 MED ORDER — OXYCODONE HCL ER 10 MG PO T12A
10.0000 mg | EXTENDED_RELEASE_TABLET | Freq: Two times a day (BID) | ORAL | Status: DC
  Administered 2017-12-14 – 2017-12-15 (×2): 10 mg via ORAL
  Filled 2017-12-14 (×2): qty 1

## 2017-12-14 MED ORDER — DOCUSATE SODIUM 100 MG PO CAPS
100.0000 mg | ORAL_CAPSULE | Freq: Two times a day (BID) | ORAL | Status: DC
  Administered 2017-12-14 – 2017-12-15 (×2): 100 mg via ORAL
  Filled 2017-12-14 (×2): qty 1

## 2017-12-14 MED ORDER — BISACODYL 5 MG PO TBEC: 5.0000 mg | DELAYED_RELEASE_TABLET | Freq: Every day | ORAL | Status: DC | PRN

## 2017-12-14 MED ORDER — ROCURONIUM BROMIDE 100 MG/10ML IV SOLN
INTRAVENOUS | Status: DC | PRN
  Administered 2017-12-14: 20 mg via INTRAVENOUS
  Administered 2017-12-14: 50 mg via INTRAVENOUS

## 2017-12-14 MED ORDER — MIDAZOLAM HCL 5 MG/5ML IJ SOLN
INTRAMUSCULAR | Status: DC | PRN
  Administered 2017-12-14: 2 mg via INTRAVENOUS

## 2017-12-14 MED ORDER — ACETAMINOPHEN 325 MG PO TABS: 650.0000 mg | ORAL_TABLET | ORAL | Status: DC | PRN

## 2017-12-14 MED ORDER — PHENYLEPHRINE 40 MCG/ML (10ML) SYRINGE FOR IV PUSH (FOR BLOOD PRESSURE SUPPORT)
PREFILLED_SYRINGE | INTRAVENOUS | Status: DC | PRN
  Administered 2017-12-14 (×4): 80 ug via INTRAVENOUS

## 2017-12-14 MED ORDER — FENTANYL CITRATE (PF) 250 MCG/5ML IJ SOLN
INTRAMUSCULAR | Status: AC
  Filled 2017-12-14: qty 5

## 2017-12-14 MED ORDER — ONDANSETRON HCL 4 MG PO TABS: 4.0000 mg | ORAL_TABLET | Freq: Four times a day (QID) | ORAL | Status: DC | PRN

## 2017-12-14 MED ORDER — LACTATED RINGERS IV SOLN
INTRAVENOUS | Status: DC
  Administered 2017-12-14 (×2): via INTRAVENOUS

## 2017-12-14 MED ORDER — PROMETHAZINE HCL 25 MG/ML IJ SOLN: 6.2500 mg | INTRAMUSCULAR | Status: DC | PRN

## 2017-12-14 MED ORDER — PHENYLEPHRINE HCL 10 MG/ML IJ SOLN
INTRAMUSCULAR | Status: DC | PRN
  Administered 2017-12-14: 50 ug/min via INTRAVENOUS

## 2017-12-14 MED ORDER — FENTANYL CITRATE (PF) 100 MCG/2ML IJ SOLN
INTRAMUSCULAR | Status: AC
  Filled 2017-12-14: qty 2

## 2017-12-14 MED ORDER — MEPERIDINE HCL 25 MG/ML IJ SOLN: 6.2500 mg | INTRAMUSCULAR | Status: DC | PRN

## 2017-12-14 MED ORDER — MAGNESIUM CITRATE PO SOLN: 1.0000 | Freq: Once | ORAL | Status: DC | PRN

## 2017-12-14 MED ORDER — THROMBIN (RECOMBINANT) 5000 UNITS EX SOLR
CUTANEOUS | Status: DC | PRN
  Administered 2017-12-14 (×2): 5000 [IU] via TOPICAL

## 2017-12-14 MED ORDER — OXYCODONE HCL 5 MG/5ML PO SOLN: 5.0000 mg | Freq: Once | ORAL | Status: DC | PRN

## 2017-12-14 MED ORDER — HYDROMORPHONE HCL 1 MG/ML IJ SOLN
0.2500 mg | INTRAMUSCULAR | Status: DC | PRN
  Administered 2017-12-14 (×4): 0.5 mg via INTRAVENOUS

## 2017-12-14 MED ORDER — POTASSIUM CHLORIDE IN NACL 20-0.9 MEQ/L-% IV SOLN: INTRAVENOUS | Status: DC

## 2017-12-14 MED ORDER — OXYCODONE HCL 5 MG PO TABS
10.0000 mg | ORAL_TABLET | ORAL | Status: DC | PRN
  Administered 2017-12-14 – 2017-12-15 (×4): 10 mg via ORAL
  Filled 2017-12-14 (×4): qty 2

## 2017-12-14 SURGICAL SUPPLY — 58 items
ADH SKN CLS APL DERMABOND .7 (GAUZE/BANDAGES/DRESSINGS) ×1
APL SKNCLS STERI-STRIP NONHPOA (GAUZE/BANDAGES/DRESSINGS)
BAG DECANTER FOR FLEXI CONT (MISCELLANEOUS) ×3 IMPLANT
BENZOIN TINCTURE PRP APPL 2/3 (GAUZE/BANDAGES/DRESSINGS) IMPLANT
BLADE CLIPPER SURG (BLADE) IMPLANT
BUR MATCHSTICK NEURO 3.0 LAGG (BURR) ×3 IMPLANT
BUR PRECISION FLUTE 5.0 (BURR) ×2 IMPLANT
CANISTER SUCT 3000ML PPV (MISCELLANEOUS) ×3 IMPLANT
CARTRIDGE OIL MAESTRO DRILL (MISCELLANEOUS) ×1 IMPLANT
CLOSURE WOUND 1/2 X4 (GAUZE/BANDAGES/DRESSINGS)
DECANTER SPIKE VIAL GLASS SM (MISCELLANEOUS) ×3 IMPLANT
DERMABOND ADVANCED (GAUZE/BANDAGES/DRESSINGS) ×2
DERMABOND ADVANCED .7 DNX12 (GAUZE/BANDAGES/DRESSINGS) ×1 IMPLANT
DIFFUSER DRILL AIR PNEUMATIC (MISCELLANEOUS) ×3 IMPLANT
DRAPE LAPAROTOMY 100X72X124 (DRAPES) ×3 IMPLANT
DRAPE MICROSCOPE LEICA (MISCELLANEOUS) ×3 IMPLANT
DRAPE POUCH INSTRU U-SHP 10X18 (DRAPES) ×3 IMPLANT
DRAPE SURG 17X23 STRL (DRAPES) ×3 IMPLANT
DURAPREP 26ML APPLICATOR (WOUND CARE) ×3 IMPLANT
ELECT REM PT RETURN 9FT ADLT (ELECTROSURGICAL) ×3
ELECTRODE REM PT RTRN 9FT ADLT (ELECTROSURGICAL) ×1 IMPLANT
GAUZE SPONGE 4X4 12PLY STRL (GAUZE/BANDAGES/DRESSINGS) IMPLANT
GAUZE SPONGE 4X4 16PLY XRAY LF (GAUZE/BANDAGES/DRESSINGS) IMPLANT
GLOVE BIOGEL PI IND STRL 7.5 (GLOVE) IMPLANT
GLOVE BIOGEL PI IND STRL 8 (GLOVE) IMPLANT
GLOVE BIOGEL PI INDICATOR 7.5 (GLOVE) ×2
GLOVE BIOGEL PI INDICATOR 8 (GLOVE) ×6
GLOVE EXAM NITRILE LRG STRL (GLOVE) IMPLANT
GLOVE EXAM NITRILE XL STR (GLOVE) IMPLANT
GLOVE EXAM NITRILE XS STR PU (GLOVE) IMPLANT
GLOVE SS N UNI LF 6.5 STRL (GLOVE) ×4 IMPLANT
GLOVE SS N UNI LF 7.5 STRL (GLOVE) ×6 IMPLANT
GOWN STRL REUS W/ TWL LRG LVL3 (GOWN DISPOSABLE) ×2 IMPLANT
GOWN STRL REUS W/ TWL XL LVL3 (GOWN DISPOSABLE) IMPLANT
GOWN STRL REUS W/TWL 2XL LVL3 (GOWN DISPOSABLE) ×4 IMPLANT
GOWN STRL REUS W/TWL LRG LVL3 (GOWN DISPOSABLE) ×6
GOWN STRL REUS W/TWL XL LVL3 (GOWN DISPOSABLE)
KIT BASIN OR (CUSTOM PROCEDURE TRAY) ×3 IMPLANT
KIT ROOM TURNOVER OR (KITS) ×3 IMPLANT
NDL HYPO 25X1 1.5 SAFETY (NEEDLE) ×1 IMPLANT
NDL SPNL 18GX3.5 QUINCKE PK (NEEDLE) IMPLANT
NEEDLE HYPO 25X1 1.5 SAFETY (NEEDLE) ×3 IMPLANT
NEEDLE SPNL 18GX3.5 QUINCKE PK (NEEDLE) IMPLANT
NS IRRIG 1000ML POUR BTL (IV SOLUTION) ×3 IMPLANT
OIL CARTRIDGE MAESTRO DRILL (MISCELLANEOUS) ×3
PACK LAMINECTOMY NEURO (CUSTOM PROCEDURE TRAY) ×3 IMPLANT
PAD ARMBOARD 7.5X6 YLW CONV (MISCELLANEOUS) ×9 IMPLANT
RUBBERBAND STERILE (MISCELLANEOUS) ×6 IMPLANT
SPONGE LAP 4X18 X RAY DECT (DISPOSABLE) IMPLANT
SPONGE SURGIFOAM ABS GEL SZ50 (HEMOSTASIS) ×3 IMPLANT
STRIP CLOSURE SKIN 1/2X4 (GAUZE/BANDAGES/DRESSINGS) IMPLANT
SUT VIC AB 0 CT1 18XCR BRD8 (SUTURE) ×1 IMPLANT
SUT VIC AB 0 CT1 8-18 (SUTURE) ×3
SUT VIC AB 2-0 CT1 18 (SUTURE) ×3 IMPLANT
SUT VIC AB 3-0 SH 8-18 (SUTURE) ×3 IMPLANT
TOWEL GREEN STERILE (TOWEL DISPOSABLE) ×3 IMPLANT
TOWEL GREEN STERILE FF (TOWEL DISPOSABLE) ×3 IMPLANT
WATER STERILE IRR 1000ML POUR (IV SOLUTION) ×3 IMPLANT

## 2017-12-14 NOTE — Anesthesia Procedure Notes (Signed)
Procedure Name: Intubation Date/Time: 12/14/2017 2:28 PM Performed by: Montez Moritaarter, Zaul Hubers W, CRNA Pre-anesthesia Checklist: Patient identified, Emergency Drugs available, Suction available and Patient being monitored Patient Re-evaluated:Patient Re-evaluated prior to induction Oxygen Delivery Method: Circle system utilized Preoxygenation: Pre-oxygenation with 100% oxygen Induction Type: IV induction Ventilation: Mask ventilation without difficulty Laryngoscope Size: Miller and 2 Grade View: Grade I Tube type: Oral Tube size: 7.0 mm Number of attempts: 1 Airway Equipment and Method: Stylet and Oral airway Placement Confirmation: ETT inserted through vocal cords under direct vision,  positive ETCO2 and breath sounds checked- equal and bilateral Secured at: 22 cm Tube secured with: Tape Dental Injury: Teeth and Oropharynx as per pre-operative assessment

## 2017-12-14 NOTE — Op Note (Signed)
12/14/2017  4:02 PM  PATIENT:  Lisa CoasterLatonya Cousin  47 y.o. female with severe pain in the right lower extremity and a herniated disc at L4/5 on the right side. Having failed conservative treatment she has opted for operative decompression.   PRE-OPERATIVE DIAGNOSIS:  DISC DISPLACEMENT, LUMBAR 4/5, right  POST-OPERATIVE DIAGNOSIS:  DISC DISPLACEMENT, LUMBAR 4/5 right  PROCEDURE:  Procedure(s): MICRODISCECTOMY LUMBAR FOUR- LUMBAR FIVE - RIGHT  SURGEON:   Surgeon(s): Coletta Memosabbell, Aaisha Sliter, MD  ASSISTANTS:none  ANESTHESIA:   general  EBL:  Total I/O In: 1000 [I.V.:1000] Out: 25 [Blood:25]  BLOOD ADMINISTERED:none  CELL SAVER GIVEN:none  COUNT:per nursing  DRAINS: none   SPECIMEN:  No Specimen  DICTATION: [name] was taken to the operating room, intubated and placed under a general anesthetic without difficulty. She was positioned prone on a Wilson frame with all pressure points padded. Her back was prepped and draped in a sterile manner. I opened the skin with a 10 blade and carried the dissection down to the thoracolumbar fascia. I used both sharp dissection and the monopolar cautery to expose the lamina of L4, and L5 on the right side. I confirmed my location with an intraoperative xray.  I used the drill, Kerrison punches, and curettes to perform a semihemilaminectomy of L4. I used the punches to remove the ligamentum flavum to expose the thecal sac. I brought the microscope into the operative field. I started the decompression of the spinal canal, thecal sac and L5 root(s). I cauterized epidural veins overlying the disc space then divided them sharply. I opened the disc space with a 15 blade and proceeded with the discectomy. I used pituitary rongeurs, curettes, and other instruments to remove disc material. After the discectomy was completed I inspected the L5 nerve root and felt it was well decompressed. I explored rostrally, laterally, medially, and caudally and was satisfied with the  decompression. I irrigated the wound, then closed in layers. I approximated the thoracolumbar fascia, subcutaneous, and subcuticular planes with vicryl sutures. I used dermabond for a sterile dressing.   PLAN OF CARE: Admit for overnight observation  PATIENT DISPOSITION:  PACU - hemodynamically stable.   Delay start of Pharmacological VTE agent (>24hrs) due to surgical blood loss or risk of bleeding:  yes

## 2017-12-14 NOTE — H&P (Signed)
BP (!) 155/90   Pulse 77   Temp 98.2 F (36.8 C) (Oral)   Resp 20   Ht 5\' 6"  (1.676 m)   Wt 87.5 kg (193 lb)   SpO2 100%   BMI 31.15 kg/m  Lisa Newton is a 47 year old woman, who presents today for evaluation of severe pain she has in her back and right lower extremity. She has had this pain since at least April, or even longer than that, of this year. She had an operation on the cervical spine performed by Dr. Erline LevineJim Nitka, in April of this year for herniated disc eccentric to the left of C6-7. She still has pain from that, and has asked me questions about that in addition to the lower back. She also had an MRI performed Apr 19, 2017, which showed a herniated disc eccentric to the right side at L4-5. She says that she has gotten much worse since she returned to work on October 8th, and it then progressed yet again since last week. She literally is hopping and limping quite severely on the right lower extremity when she comes in for interview. She has had an injection, she has had physical therapy, and clearly she has had time. She has not been taking any narcotics as no one would prescribe them, by her account. She says that they have not helped. Dr. Otelia SergeantNitka felt that she should undergo operative decompression at 4-5, and that is the reason he would no longer prescribe pain medication because she refused to do that. She says the pain now just shoots down the right lower extremity.  She is 5 feet 6 inches in height, weighs 190 pounds, temperature is 98.8 degrees Fahrenheit, blood pressure is 162/94, pulse is 66, pain is 10/10. She works at [employer]. She has had petrositis of the left ear. She has had a herniated disc. She says she had an infection to her brain; not sure what that is. She takes Tizanidine and Meloxicam. Mother [id number], has hypertension. She still has left shoulder pain, back pain and right lower extremity pain. She does not know what started the pain. REVIEW OF  SYSTEMS: Positive for hearing loss, tinnitus, balance problems, shortness of breath, leg pain with walking and at rest, arthritis, neck pain, arm weakness, back pain, arm pain, blurred vision, lower extremity weakness, anxiety, and depression. EXAM: She is alert oriented by 4, in obvious distress. She has weakness, either pain induced or true, in the right lower extremity. Reflexes 2+ at the knees and ankles, biceps, triceps, brachioradialis. Normal muscle tone, bulk, coordination. Gait is markedly antalgic. No clonus. No Hoffmann sign. Toes are downgoing on plantar stimulation. Proprioception is intact. Pupils equal, round, and reactive to light. Full extraocular movements. Full visual fields. Hearing intact. Symmetric facial movements and sensation.  Lisa Newton comes in today for evaluation of the disc herniation that she has at L4-5. The MRI shows that this is essentially the same size now as it was then. I would still recommend that she undergo operative decompression and she has agreed. She does not wish to pursue any other type of procedure. She has had this at the very least for the last 7 months. We do have the previous scan. Risks and benefits including bleeding, infection, no relief, need for further surgery were discussed. She understands and wishes to proceed. I will have to take a look at the schedule when I return to the office to try to get this scheduled.

## 2017-12-14 NOTE — Transfer of Care (Signed)
Immediate Anesthesia Transfer of Care Note  Patient: Lisa Newton  Procedure(s) Performed: MICRODISCECTOMY LUMBAR FOUR- LUMBAR FIVE - RIGHT (Right Spine Lumbar)  Patient Location: PACU  Anesthesia Type:General  Level of Consciousness: awake  Airway & Oxygen Therapy: Patient Spontanous Breathing and Patient connected to face mask oxygen  Post-op Assessment: Report given to RN and Post -op Vital signs reviewed and stable  Post vital signs: Reviewed and stable  Last Vitals:  Vitals:   12/14/17 1113  BP: (!) 155/90  Pulse: 77  Resp: 20  Temp: 36.8 C  SpO2: 100%    Last Pain:  Vitals:   12/14/17 1123  TempSrc:   PainSc: 10-Worst pain ever      Patients Stated Pain Goal: 4 (12/14/17 1123)  Complications: No apparent anesthesia complications

## 2017-12-14 NOTE — Progress Notes (Signed)
Patient ID: Lisa Newton, female   DOB: 04/15/1971, 47 y.o.   MRN: [id number] BP (!) 157/88   Pulse 77   Temp 97.8 F (36.6 C)   Resp 16   Ht 5\' 6"  (1.676 m)   Wt 87.5 kg (193 lb)   SpO2 100%   BMI 31.15 kg/m  Alert and oriented x 4, speech is clear and fluent Moving all extremities well

## 2017-12-14 NOTE — Anesthesia Preprocedure Evaluation (Signed)
Anesthesia Evaluation   Patient awake    Reviewed: Allergy & Precautions, NPO status , Patient's Chart, lab work & pertinent test results  History of Anesthesia Complications (+) PONV and history of anesthetic complications  Airway Mallampati: II  TM Distance: >3 FB Neck ROM: Full    Dental  (+) Teeth Intact   Pulmonary    breath sounds clear to auscultation       Cardiovascular hypertension, Pt. on medications  Rhythm:Regular Rate:Normal     Neuro/Psych PSYCHIATRIC DISORDERS Anxiety Depression  Neuromuscular disease    GI/Hepatic Neg liver ROS,   Endo/Other    Renal/GU negative Renal ROS     Musculoskeletal  (+) Arthritis ,   Abdominal   Peds  Hematology   Anesthesia Other Findings   Reproductive/Obstetrics                             Anesthesia Physical  Anesthesia Plan  ASA: II  Anesthesia Plan: General   Post-op Pain Management:    Induction:   PONV Risk Score and Plan: 4 or greater and Ondansetron, Dexamethasone, Midazolam and Scopolamine patch - Pre-op  Airway Management Planned: Oral ETT  Additional Equipment:   Intra-op Plan:   Post-operative Plan: Extubation in OR  Informed Consent: I have reviewed the patients History and Physical, chart, labs and discussed the procedure including the risks, benefits and alternatives for the proposed anesthesia with the patient or authorized representative who has indicated his/her understanding and acceptance.   Dental advisory given  Plan Discussed with: CRNA  Anesthesia Plan Comments:         Anesthesia Quick Evaluation

## 2017-12-15 ENCOUNTER — Encounter (HOSPITAL_COMMUNITY): Payer: Self-pay | Admitting: Neurosurgery

## 2017-12-15 DIAGNOSIS — M5126 Other intervertebral disc displacement, lumbar region: Secondary | ICD-10-CM | POA: Diagnosis not present

## 2017-12-15 MED ORDER — OXYCODONE-ACETAMINOPHEN 10-325 MG PO TABS: 1.0000 | ORAL_TABLET | ORAL | 0 refills | Status: DC | PRN

## 2017-12-15 MED ORDER — CYCLOBENZAPRINE HCL 5 MG PO TABS: 5.0000 mg | ORAL_TABLET | Freq: Every day | ORAL | 2 refills | Status: DC

## 2017-12-15 NOTE — Discharge Summary (Signed)
Physician Discharge Summary  Patient ID: Lisa Newton MRN: [id number] DOB/AGE: 1970-12-14 47 y.o.  Admit date: 12/14/2017 Discharge date: 12/15/2017  Admission Diagnoses:  HNP - lumbar  Discharge Diagnoses:  Same Active Problems:   HNP (herniated nucleus pulposus), lumbar  Discharged Condition: Stable  Hospital Course:  Lisa Newton is a 47 y.o. female who was admitted for the below procedure. There were no post operative complications. At time of discharge, pain was well controlled, ambulating, tolerating po, voiding normal. Ready for discharge.  Treatments: Surgery MICRODISCECTOMY LUMBAR FOUR- LUMBAR FIVE - RIGHT  Discharge Exam: Blood pressure (!) 142/91, pulse 70, temperature 98.5 F (36.9 C), resp. rate 16, height 5\' 6"  (1.676 m), weight 87.5 kg (193 lb), SpO2 99 %. Awake, alert, oriented Speech fluent, appropriate CN grossly intact 5/5 BUE/BLE Wound c/d/i  Disposition: 01-Home or Self Care  Discharge Instructions    Call MD for:  difficulty breathing, headache or visual disturbances   Complete by:  As directed    Call MD for:  persistant dizziness or light-headedness   Complete by:  As directed    Call MD for:  redness, tenderness, or signs of infection (pain, swelling, redness, odor or green/yellow discharge around incision site)   Complete by:  As directed    Call MD for:  severe uncontrolled pain   Complete by:  As directed    Call MD for:  temperature >100.4   Complete by:  As directed    Diet general   Complete by:  As directed    Driving Restrictions   Complete by:  As directed    Do not drive until given clearance.   Increase activity slowly   Complete by:  As directed    Lifting restrictions   Complete by:  As directed    Do not lift anything >10lbs. Avoid bending and twisting in awkward positions. Avoid bending at the back.   May shower / Bathe   Complete by:  As directed    In 24 hours. Okay to wash wound with warm soapy water. Avoid  scrubbing the wound. Pat dry.   Remove dressing in 24 hours   Complete by:  As directed      Allergies as of 12/15/2017      Reactions   Augmentin [amoxicillin-pot Clavulanate] Hives, Other (See Comments)   Took Bactrim and Augmentin together and broke out in hives.Marland KitchenMarland KitchenPt says penicillin/amoxicillin previously without any problems.the patient is unable to answer any penicillin questions as it is unknown exactly what medication broke her out in hives.   Bactrim [sulfamethoxazole-trimethoprim] Hives, Other (See Comments)   Took Bactrim and Augmentin together and broke out in hives.Marland KitchenMarland KitchenPt says she took bactrim previously without any problems, she did state that she had just been released after a course of vancomycin iv and didn't know if that hadn't affected the bactrim.    Latex Hives   Pollen Extract    UNSPECIFIED REACTION    Hydrocodone-acetaminophen Nausea And Vomiting   GI upset   Tape Rash      Medication List    STOP taking these medications   HYDROcodone-acetaminophen 5-325 MG tablet Commonly known as:  NORCO/VICODIN     TAKE these medications   acetaminophen 325 MG tablet Commonly known as:  TYLENOL Take 650 mg by mouth every 6 (six) hours as needed for moderate pain or headache.   atorvastatin 20 MG tablet Commonly known as:  LIPITOR Take 1 tablet (20 mg total) by mouth daily.   cyclobenzaprine 5  MG tablet Commonly known as:  FLEXERIL Take 1 tablet (5 mg total) by mouth at bedtime.   diclofenac 75 MG EC tablet Commonly known as:  VOLTAREN Take 1 tablet (75 mg total) by mouth 2 (two) times daily.   DULoxetine 60 MG capsule Commonly known as:  CYMBALTA Take 1 capsule (60 mg total) by mouth daily.   gabapentin 300 MG capsule Commonly known as:  NEURONTIN Take 300 mg by mouth 3 (three) times daily.   hydrochlorothiazide 12.5 MG capsule Commonly known as:  MICROZIDE Take 1 capsule (12.5 mg total) by mouth daily. Hold if BP is consistently less than 130/80    hydrOXYzine 25 MG capsule Commonly known as:  VISTARIL Take 1 capsule (25 mg total) by mouth every 8 (eight) hours as needed for anxiety (Insomnia). 1 PO 1 hour prior to procedure   ibuprofen 600 MG tablet Commonly known as:  ADVIL,MOTRIN Take 1 tablet (600 mg total) by mouth every 8 (eight) hours as needed for moderate pain. What changed:  how much to take   KELO-COTE Gel Apply 1 g topically daily.   meloxicam 15 MG tablet Commonly known as:  MOBIC Take 1 tablet (15 mg total) by mouth daily.   nabumetone 500 MG tablet Commonly known as:  RELAFEN Take 500 mg by mouth daily.   oxyCODONE-acetaminophen 10-325 MG tablet Commonly known as:  PERCOCET Take 1 tablet by mouth every 4 (four) hours as needed for pain.   pregabalin 75 MG capsule Commonly known as:  LYRICA Take 1 capsule (75 mg total) by mouth 2 (two) times daily.   tiZANidine 4 MG tablet Commonly known as:  ZANAFLEX TAKE 1 TABLET (4 MG TOTAL) BY MOUTH EVERY 6 (SIX) HOURS AS NEEDED FOR MUSCLE SPASMS.      Follow-up Information    Coletta Memos, MD Follow up.   Specialty:  Neurosurgery Contact information: [address] [place] [address] [phone]           Signed: Alyson Ingles 12/15/2017, 11:06 AM

## 2017-12-15 NOTE — Progress Notes (Signed)
Patient alert and oriented, mae's well, voiding adequate amount of urine, swallowing without difficulty, no c/o pain at time of discharge. Patient discharged home with family. Script and discharged instructions given to patient. Patient and family stated understanding of instructions given. Patient has an appointment with Dr. Cabbell   

## 2017-12-15 NOTE — Anesthesia Postprocedure Evaluation (Signed)
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  Procedure(s) Performed: MICRODISCECTOMY LUMBAR FOUR- LUMBAR FIVE - RIGHT (Right Spine Lumbar)     Patient location during evaluation: PACU Anesthesia Type: General Level of consciousness: awake and alert Pain management: pain level controlled Vital Signs Assessment: post-procedure vital signs reviewed and stable Respiratory status: spontaneous breathing, nonlabored ventilation, respiratory function stable and patient connected to nasal cannula oxygen Cardiovascular status: blood pressure returned to baseline and stable Postop Assessment: no apparent nausea or vomiting Anesthetic complications: no    Last Vitals:  Vitals:   12/15/17 0450 12/15/17 0810  BP: (!) 147/74 (!) 142/91  Pulse: 71 70  Resp: 18 16  Temp: 37 C 36.9 C  SpO2: 98% 99%    Last Pain:  Vitals:   12/15/17 0930  TempSrc:   PainSc: 6                  Akisha Sturgill EDWARD

## 2018-01-05 ENCOUNTER — Encounter (INDEPENDENT_AMBULATORY_CARE_PROVIDER_SITE_OTHER): Payer: Self-pay | Admitting: Specialist
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
## 2018-01-16 ENCOUNTER — Ambulatory Visit: Payer: Medicaid Other | Admitting: Family Medicine

## 2018-01-19 ENCOUNTER — Ambulatory Visit: Payer: Medicaid Other | Admitting: Family Medicine

## 2018-02-18 NOTE — Progress Notes (Signed)
   [facility] Phone: [phone]   Date of Visit: 02/19/2018   HPI:  Hospital Follow up: Lumbar Microdiscectomy  - had surgery in January.  - she continues to have back pain with radiculopathy to the right leg - she reports of chronic weakness and pain which has been stable  - no numbness, urinary incontinence/retention, fecal incontinence - using ibuprofen 800mg  which does not help  - she had side effect of memory loss with gabapentin.  - she had a follow up visit with neurosurgery a few days ago. Per the note, the surgeon would like her to wait to go to work for 6-8 weeks. Follow up PRN - her insurance only covered one PT session. She is doing the exercises at home.   Right Foot Pain:  - chronic issue  - pain on the dorsal aspect near the ball of the foot - has not tried any therapies - no history of injury   ROS: See HPI.  PMFSH:  Past medical history: HTN HLD,, depression with anxiety  PHYSICAL EXAM: BP 130/90   Pulse 81   Temp 98.6 F (37 C) (Oral)   Ht 5\' 6"  (1.676 m)   Wt 188 lb (85.3 kg)   LMP 02/12/2018 (Approximate)   SpO2 99%   BMI 30.34 kg/m  GEN: NAD, tearful at times  CV: RRR, no murmurs, rubs, or gallops PULM: CTAB, normal effort SKIN: No rash or cyanosis; warm and well-perfused MSK: no midline tenderness of spine. Some right paraspinal tenderness to palpation at the level of the lumbar spine. Normal sensation to light touch in bilateral lower extremities. 4/5 strength in the right lower extremity, 5/5 strength in the left lower extremity. Right foot: no swelling or erythema. Medial longitudinal arch is normal. Transverse longitudinal arch seems flat.  No tenderness to palpation of the dorsal aspect of the foot. Tenderness to palpation of the plantar aspect of the central forefoot.  EXTR: No lower extremity edema or calf tenderness PSYCH: Mood and affect euthymic, normal rate and volume of speech NEURO: Awake, alert, no focal  deficits grossly, normal speech   ASSESSMENT/PLAN:  Health maintenance:  - declined Flu vaccine  Right lumbar radiculopathy Symptoms are stable and not worsening. I encouraged her to give her healing some more time. Encouraged follow up with neurosurgery in about 4 weeks. She is willing to try Lyrica 75mg  BID.   Right foot pain Possibly due to flat transverse arch. Referral to sports medicine for further evaluation and fitting for possible transverse arch support.   Palma HolterKanishka G Erven Ramson, MD PGY 3  Family Medicine

## 2018-02-19 ENCOUNTER — Ambulatory Visit: Payer: Medicaid Other | Admitting: Internal Medicine

## 2018-02-19 ENCOUNTER — Encounter: Payer: Self-pay | Admitting: Internal Medicine

## 2018-02-19 ENCOUNTER — Other Ambulatory Visit: Payer: Self-pay

## 2018-02-19 VITALS — BP 130/90 | HR 81 | Temp 98.6°F | Ht 66.0 in | Wt 188.0 lb

## 2018-02-19 DIAGNOSIS — M5416 Radiculopathy, lumbar region: Secondary | ICD-10-CM | POA: Diagnosis not present

## 2018-02-19 DIAGNOSIS — M79671 Pain in right foot: Secondary | ICD-10-CM

## 2018-02-19 MED ORDER — PREGABALIN 75 MG PO CAPS: 75.0000 mg | ORAL_CAPSULE | Freq: Two times a day (BID) | ORAL | 2 refills | Status: DC

## 2018-02-19 NOTE — Telephone Encounter (Signed)
Pt called nurse line, requesting refill of Lyrica sent to her pharmacy. Pt call back [phone] Shawna OrleansMeredith B Sahra Converse, RN

## 2018-02-19 NOTE — Patient Instructions (Addendum)
I made a referral to sports medicine for your foot.   Let's try Lyrica for your right leg pain  Continue the physical therapy exercises  I would recommend returning to the neurosurgeon in about 4 - 6 weeks.

## 2018-02-19 NOTE — Assessment & Plan Note (Signed)
Symptoms are stable and not worsening. I encouraged her to give her healing some more time. Encouraged follow up with neurosurgery in about 4 weeks. She is willing to try Lyrica 75mg  BID.

## 2018-02-19 NOTE — Assessment & Plan Note (Signed)
Possibly due to flat transverse arch. Referral to sports medicine for further evaluation and fitting for possible transverse arch support.

## 2018-02-21 ENCOUNTER — Ambulatory Visit: Payer: Medicaid Other | Admitting: Family Medicine

## 2018-02-21 DIAGNOSIS — M79671 Pain in right foot: Secondary | ICD-10-CM | POA: Diagnosis not present

## 2018-02-21 NOTE — Patient Instructions (Signed)
I suspect your pain is all due to lumbar radiculopathy (the nerve pain into your right leg). However, you do have tenderness within your plantar fascia and there is no harm in treating this as well. Start taking the lyrica you were just prescribed. Ok to take norco and flexeril as you have been. Ibuprofen 800mg  three times a day with food OR aleve 2 tabs twice a day with food for pain and inflammation. Plantar fascia stretch for 20-30 seconds (do 3 of these) in morning Lowering/raise on a step exercises 3 x 10 once or twice a day - this is very important for long term recovery. Can add heel walks, toe walks forward and backward as well Ice heel for 15 minutes as needed. Avoid flat shoes/barefoot walking as much as possible. Arch straps have been shown to help with pain. Inserts are important (dr. Jari Sportsmanscholls active series, spencos, our green insoles, custom orthotics). Physical therapy is also an option. Follow up with me as needed.

## 2018-02-22 ENCOUNTER — Encounter: Payer: Self-pay | Admitting: Family Medicine

## 2018-02-22 NOTE — Progress Notes (Signed)
PCP: Doreene Eland, MD Consultation requested by:  Dr. Ottie Glazier MD  Subjective:   HPI: Patient is a 47 y.o. female here for right foot pain.  Patient reports that she has had about 6-7 months of plantar right foot pain. She reports pain is worse with walking up to a 6 out of 10 level and sharp. She has tried Norco, Flexeril, ibuprofen 800 mg without much benefit.  Just prescribed lyrica but hasn't taken yet. She reports recently having had lumbar spine surgery on January 3 and her current pain predates that. Pain is associated with pain in her right hip into her right knee and also into the foot.  Past Medical History:  Diagnosis Date  . Arthritis   . Depression 01/11/2012  . DVT (deep venous thrombosis) (HCC) 04/07/2015  . Hypertension   . Left ear hearing loss   . Neck pain   . PONV (postoperative nausea and vomiting)     Current Outpatient Medications on File Prior to Visit  Medication Sig Dispense Refill  . acetaminophen (TYLENOL) 325 MG tablet Take 650 mg by mouth every 6 (six) hours as needed for moderate pain or headache.    [name] atorvastatin (LIPITOR) 20 MG tablet Take 1 tablet (20 mg total) by mouth daily. (Patient not taking: Reported on 11/29/2017) 90 tablet 3  . cyclobenzaprine (FLEXERIL) 5 MG tablet Take 1 tablet (5 mg total) by mouth at bedtime. 60 tablet 2  . diclofenac (VOLTAREN) 75 MG EC tablet Take 1 tablet (75 mg total) by mouth 2 (two) times daily. (Patient not taking: Reported on 11/29/2017) 14 tablet 0  . DULoxetine (CYMBALTA) 60 MG capsule Take 1 capsule (60 mg total) by mouth daily. (Patient not taking: Reported on 11/29/2017) 30 capsule 11  . gabapentin (NEURONTIN) 300 MG capsule Take 300 mg by mouth 3 (three) times daily.    . hydrochlorothiazide (MICROZIDE) 12.5 MG capsule Take 1 capsule (12.5 mg total) by mouth daily. Hold if BP is consistently less than 130/80 (Patient not taking: Reported on 11/29/2017) 30 capsule 1  . hydrOXYzine (VISTARIL) 25 MG  capsule Take 1 capsule (25 mg total) by mouth every 8 (eight) hours as needed for anxiety (Insomnia). 1 PO 1 hour prior to procedure 30 capsule 0  . ibuprofen (ADVIL,MOTRIN) 600 MG tablet Take 1 tablet (600 mg total) by mouth every 8 (eight) hours as needed for moderate pain. (Patient taking differently: Take 800 mg by mouth every 8 (eight) hours as needed for moderate pain. ) 90 tablet 0  . meloxicam (MOBIC) 15 MG tablet Take 1 tablet (15 mg total) by mouth daily. (Patient not taking: Reported on 11/29/2017) 30 tablet 2  . nabumetone (RELAFEN) 500 MG tablet Take 500 mg by mouth daily.    [name] oxyCODONE-acetaminophen (PERCOCET) 10-325 MG tablet Take 1 tablet by mouth every 4 (four) hours as needed for pain. 60 tablet 0  . pregabalin (LYRICA) 75 MG capsule Take 1 capsule (75 mg total) by mouth 2 (two) times daily. 60 capsule 2  . Scar Treatment Products (KELO-COTE) GEL Apply 1 g topically daily. (Patient not taking: Reported on 11/29/2017) 15 g 1   Current Facility-Administered Medications on File Prior to Visit  Medication Dose Route Frequency Provider Last Rate Last Dose  . lidocaine (PF) (XYLOCAINE) 1 % injection 0.3 mL  0.3 mL Other Once Tyrell Antonio, MD        Past Surgical History:  Procedure Laterality Date  . cholesterol granuloma     left ear  .  left ear surgery     ruptured TM  . LUMBAR LAMINECTOMY/DECOMPRESSION MICRODISCECTOMY Right 12/14/2017   Procedure: MICRODISCECTOMY LUMBAR FOUR- LUMBAR FIVE - RIGHT;  Surgeon: Coletta Memos, MD;  Location: [facility] OR;  Service: Neurosurgery;  Laterality: Right;  MICRODISCECTOMY LUMBAR 4- LUMBAR 5 - RIGHT  . POSTERIOR CERVICAL FUSION/FORAMINOTOMY N/A 03/14/2017   Procedure: LEFT C6-7 FORAMINOTOMY WITH EXCISION OF HERNIATED NUCLEUS PULPOSUS;  Surgeon: Kerrin Champagne, MD;  Location: [facility] OR;  Service: Orthopedics;  Laterality: N/A;    Allergies  Allergen Reactions  . Augmentin [Amoxicillin-Pot Clavulanate] Hives and Other (See Comments)    Took Bactrim  and Augmentin together and broke out in hives.Marland KitchenMarland KitchenPt says penicillin/amoxicillin previously without any problems.the patient is unable to answer any penicillin questions as it is unknown exactly what medication broke her out in hives.  . Bactrim [Sulfamethoxazole-Trimethoprim] Hives and Other (See Comments)    Took Bactrim and Augmentin together and broke out in hives.Marland KitchenMarland KitchenPt says she took bactrim previously without any problems, she did state that she had just been released after a course of vancomycin iv and didn't know if that hadn't affected the bactrim.   . Latex Hives  . Pollen Extract     UNSPECIFIED REACTION   . Hydrocodone-Acetaminophen Nausea And Vomiting    GI upset  . Tape Rash    Social History   Socioeconomic History  . Marital status: Single    Spouse name: Not on file  . Number of children: 3  . Years of education: College  . Highest education level: Not on file  Social Needs  . Financial resource strain: Not on file  . Food insecurity - worry: Not on file  . Food insecurity - inability: Not on file  . Transportation needs - medical: Not on file  . Transportation needs - non-medical: Not on file  Occupational History  . Occupation: Unemployed  Tobacco Use  . Smoking status: Never Smoker  . Smokeless tobacco: Never Used  Substance and Sexual Activity  . Alcohol use: Yes    Alcohol/week: 0.0 oz    Comment: Rarely   . Drug use: Yes    Types: Marijuana    Comment: tried for pain a few weeks ago  . Sexual activity: Yes    Birth control/protection: None  Other Topics Concern  . Not on file  Social History Narrative   Lives at home with her daughter.   Right-handed.   1 cup caffeine daily.    Family History  Problem Relation Age of Onset  . Cancer Mother        lung cancer  . Diabetes Father   . Hypertension Father     BP (!) 158/85   Ht 5\' 6"  (1.676 m)   Wt 186 lb (84.4 kg)   LMP 02/12/2018 (Approximate)   BMI 30.02 kg/m   Review of Systems: See HPI  above.     Objective:  Physical Exam:  Gen: NAD, comfortable in exam room  Right foot/ankle: No gross deformity, swelling, ecchymoses FROM with 5/5 strength without reproduction of pain. TTP in plantar fascia throughout.  No other tenderness of foot or ankle. Negative ant drawer and talar tilt.   Negative syndesmotic compression. Negative metatarsal squeeze. Thompsons test negative. NV intact distally.  Left foot/ankle: No deformity. FROM with 5/5 strength. No tenderness to palpation. NVI distally.   Assessment & Plan:  1. Right foot pain - question if she has true plantar fasciitis or if this is referred pain from her persistent  pain with lumbar radiculopathy.  Advised that we go ahead with treatment for both.  She has not yet started Lyrica but plans to do so.  Advised to continue her Norco and Flexeril.  Encouraged ibuprofen or Aleve regularly.  We reviewed plantar fascial stretches and strengthening exercises.  Icing, arch binder.  Encouraged arch supports as well.  She can consider physical therapy.  Encouraged her to follow-up with her neurosurgeon as well.  Follow-up with me as needed.

## 2018-02-22 NOTE — Assessment & Plan Note (Signed)
question if she has true plantar fasciitis or if this is referred pain from her persistent pain with lumbar radiculopathy.  Advised that we go ahead with treatment for both.  She has not yet started Lyrica but plans to do so.  Advised to continue her Norco and Flexeril.  Encouraged ibuprofen or Aleve regularly.  We reviewed plantar fascial stretches and strengthening exercises.  Icing, arch binder.  Encouraged arch supports as well.  She can consider physical therapy.  Encouraged her to follow-up with her neurosurgeon as well.  Follow-up with me as needed.

## 2018-02-27 ENCOUNTER — Ambulatory Visit: Payer: Medicaid Other | Admitting: Psychology

## 2018-02-27 DIAGNOSIS — F418 Other specified anxiety disorders: Secondary | ICD-10-CM
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
## 2018-02-28 NOTE — Assessment & Plan Note (Addendum)
Diagnostic picture is unclear.  Her symptom report is not consistent with depression but it contradicts her PHQ-9 which indicates the presence of multiple symptoms.  Denied SI on the PHQ-9 and verbally.  When asked outright, she reports more difficulty with anxiety then depression.  She has reason to be anxious with financial difficulty and a complex health history that includes a recent back surgery.  Hard to know if anxiety predated these stressors.  PTSD is a possibility if trauma exists.  GAD is also a possibility.    She is mildly pressured and circumstantial during the visit.  It is difficult getting specific answers.  Feels wronged by the medical establishment because no one will listen to her.  Also feels wronged by her church.  Will work on establishing rapport and getting a sense of what she may be willing to do.    Medications may be helpful but she has not taken what has been prescribed in the past per her report.  I asked about Cymbalta because of the pain / mood connection and she said it had never been prescribed.  I see it on her med list now.  I don't think she ever took it.  Will follow in one week.  Will attempt to focus on one thing she can change / do differently that might point her in a more positive direction.

## 2018-02-28 NOTE — Progress Notes (Addendum)
[facility] Phone: [phone]   Date of Visit: 03/01/2018   HPI:  Chronic Pain:  -Patient is mainly here for follow-up of her chronic pain.  She had a lumbar microdiscectomy in January and she continues to have back pain with radiculopathy to the right leg.  She was seen in clinic on 3/11 for this. -She reports that she is frustrated because of the pain she is having.  Seems like "no one cares about her pain and no one is providing her pain medication".  We discussed starting Lyrica at the last visit.  She was sent to sports medicine for her right foot pain on March 13.  Presenting out she had not started Lyrica at that time.  For her right foot pain she was given an arch support. -She reports today that she has started Lyrica and it has maybe helped a little bit.  She continues to require ibuprofen.  She uses Flexeril 5 mg at bedtime which helps.  -She is not interested in physical therapy -She reports that her orthopedic doctor would not give her opioid pain medication.  She reports that her her surgeon has referred her to pain management.  Acne:  -Reports of having difficulties with acne on her face for about 3 years.  Notices that symptoms get worse when she is anxious or stressed. -She has tried witch hazel for her symptoms which has not helped -She does moisturize her face regularly. -She denies any history of hirsutism or irregular periods.   Depression and anxiety: -It seems like most of her stress is from her health issues. -Cymbalta is on her medication list.  But she reports she does not take it. -It is hard to really discuss her focus on her mood because of her pain that is contributing to her feeling stressed. -She denies SI and HI -PHQ 9: 18, extremely difficult -Gad 7: 16, extremely difficult -We also discussed about symptoms of mania which she reports that she has had in the past.  When we review her MDQ questionnaire she sometimes attributes  to some things due to pain.  But it seems like she has had episodes were she has not needed any sleep due to increased energy, has had racing thoughts, spending money irresponsibly.  She has not had any formal psychiatric diagnosis in the past.  She reports that she does have a family history of bipolar disorder.   ROS: See HPI.  PMFSH:  PMH:    PHYSICAL EXAM: BP 132/90   Ht 5\' 6"  (1.676 m)   Wt 188 lb (85.3 kg)   LMP 02/12/2018 (Approximate)   BMI 30.34 kg/m  GEN: NAD, she gets up to walk around the room intermittently she reports is due to her pain HEENT: Atraumatic, normocephalic, neck supple, EOMI, sclera clear  CV: RRR, no murmurs, rubs, or gallops PULM: CTAB, normal effort SKIN: Scarring noted from prior acne on the cheeks bilaterally as well as on the forehead.  Small papules noted with mild inflammation currently.  No large abscesses noted.  Open and closed comedones noted. EXTR: No lower extremity edema or calf tenderness PSYCH: Mood and affect euthymic, normal rate and volume of speech NEURO: Awake, alert, no focal deficits grossly, normal speech   ASSESSMENT/PLAN:  Right lumbar radiculopathy Status post lumbar microdiscectomy in January 2019.  Patient continues to have pain.  I encouraged her to go back to her surgeon if she continues to have this much pain.  We discussed increasing Lyrica  to 75 mg 3 times a day and increasing Flexeril 10 mg at bedtime.  She continues to report that no one cares about her pain and no one is providing her with her pain medication.  She reports that she has an appointment with pain medicine in a month but she needs something for pain until then.  I offered her a short course of tramadol, but reports that tramadol does not work for her.  Per her PCP notes in the chart, PCP requests that patient is not given opiate medications due to her history of substance abuse.   Acne Will do trial of Benzacline.  Provided instructions on how to use product  and also how to care for skin.  Depression with anxiety Difficult to fully evaluate her symptoms.  Currently she is really focused on her pain.  She is currently not on any psych medications.  Her MDQ seems to be positive for symptoms of mania in the past.  She is currently not having any symptoms of mania.  Will need to further evaluate during the upcoming visit.  I will not start her on medicine right now.  She has behavioral health follow-up in about a week.  Palma Holter, MD PGY 3 [facility]

## 2018-03-01 ENCOUNTER — Telehealth: Payer: Self-pay | Admitting: Family Medicine

## 2018-03-01 ENCOUNTER — Other Ambulatory Visit: Payer: Self-pay

## 2018-03-01 ENCOUNTER — Encounter: Payer: Self-pay | Admitting: Internal Medicine

## 2018-03-01 ENCOUNTER — Ambulatory Visit: Payer: Medicaid Other | Admitting: Internal Medicine

## 2018-03-01 DIAGNOSIS — F418 Other specified anxiety disorders: Secondary | ICD-10-CM

## 2018-03-01 DIAGNOSIS — L700 Acne vulgaris: Secondary | ICD-10-CM

## 2018-03-01 DIAGNOSIS — M5416 Radiculopathy, lumbar region: Secondary | ICD-10-CM | POA: Diagnosis present

## 2018-03-01 DIAGNOSIS — G8929 Other chronic pain: Secondary | ICD-10-CM

## 2018-03-01 DIAGNOSIS — M5441 Lumbago with sciatica, right side: Principal | ICD-10-CM

## 2018-03-01 MED ORDER — LIDOCAINE 1.8 % EX PTCH: 1.0000 | MEDICATED_PATCH | Freq: Every day | CUTANEOUS | 0 refills | Status: DC | PRN

## 2018-03-01 MED ORDER — CLINDAMYCIN PHOS-BENZOYL PEROX 1-5 % EX GEL: Freq: Two times a day (BID) | CUTANEOUS | 0 refills | Status: DC

## 2018-03-01 MED ORDER — PREGABALIN 75 MG PO CAPS: 75.0000 mg | ORAL_CAPSULE | Freq: Three times a day (TID) | ORAL | 0 refills | Status: DC

## 2018-03-01 NOTE — Telephone Encounter (Signed)
Pt was seen today in clinic by Gunadasa. I am not sure if pain meds were addressed at visit. Will send to her and PCP for referral.

## 2018-03-01 NOTE — Patient Instructions (Addendum)
Let's increase Lyrica 75mg  three times a day.  Increase Flexeril to 10mg  at bedtime.  Follow up in about 3-4 weeks.   Acne Plan  Products: Face Wash:  Use a gentle cleanser, such as Cetaphil (generic version of this is fine) Moisturizer:  Use an "oil-free" moisturizer with SPF Prescription Cream(s):  Benzacline in the morning and at bedtime  Morning: Wash face, then completely dry Apply Benzacline, pea size amount that you massage into problem areas on the face. Apply Moisturizer to entire face  Bedtime: Wash face, then completely dry Apply Benzacline, pea size amount that you massage into problem areas on the face.  Remember: - Your acne will probably get worse before it gets better - It takes at least 2 months for the medicines to start working - Use oil free soaps and lotions; these can be over the counter or store-brand - Don't use harsh scrubs or astringents, these can make skin irritation and acne worse - Moisturize daily with oil free lotion because the acne medicines will dry your skin - Try to keep your hair out of your face. - Don't squeeze pimples. - Exercise regularly and try to reduce your stress level.  Call your doctor if you have: - Lots of skin dryness or redness that doesn't get better if you use a moisturizer or if you use the prescription cream or lotion every other day    Stop using the acne medicine immediately and see your doctor if you are or become pregnant or if you think you had an allergic reaction (itchy rash, difficulty breathing, nausea, vomiting) to your acne medication.

## 2018-03-01 NOTE — Telephone Encounter (Signed)
Pt would like a referral to a pain mgt clinic.  She would also like some type of pain medicine. Please advise

## 2018-03-01 NOTE — Telephone Encounter (Signed)
She reported to me that her orthopedic doctor already placed a referral to pain management. Regardless, I ordered another referral. She declined Tramadol that was offered to her at the visit. I sent in a prescription for Lidocaine patch that she can place on her back at the most painful place. As noted in the instructions, she should wear the patch 12 hours in a 24 hour time period, and she should only use one patch in a 24 hour period. Please inform.

## 2018-03-02 ENCOUNTER — Telehealth: Payer: Self-pay

## 2018-03-02 ENCOUNTER — Encounter: Payer: Self-pay | Admitting: Internal Medicine

## 2018-03-02 ENCOUNTER — Telehealth: Payer: Self-pay | Admitting: Internal Medicine

## 2018-03-02 DIAGNOSIS — L709 Acne, unspecified: Secondary | ICD-10-CM | POA: Insufficient documentation

## 2018-03-02 HISTORY — DX: Acne, unspecified: L70.9

## 2018-03-02 NOTE — Assessment & Plan Note (Signed)
Status post lumbar microdiscectomy in January 2019.  Patient continues to have pain.  I encouraged her to go back to her surgeon if she continues to have this much pain.  We discussed increasing Lyrica to 75 mg 3 times a day and increasing Flexeril 10 mg at bedtime.  She continues to report that no one cares about her pain and no one is providing her with her pain medication.  She reports that she has an appointment with pain medicine in a month but she needs something for pain until then.  I offered her a short course of tramadol, but reports that tramadol does not work for her.  Per her PCP notes in the chart, PCP requests that patient is not given opiate medications due to her history of substance abuse.

## 2018-03-02 NOTE — Assessment & Plan Note (Signed)
Difficult to fully evaluate her symptoms.  Currently she is really focused on her pain.  She is currently not on any psych medications.  Her MDQ seems to be positive for symptoms of mania in the past.  She is currently not having any symptoms of mania.  Will need to further evaluate during the upcoming visit.  I will not start her on medicine right now.  She has behavioral health follow-up in about a week.

## 2018-03-02 NOTE — Telephone Encounter (Signed)
Received fax from [facility] requesting prior authorization of lidocaine patch . Form placed in MD's box for completion along with Medicaid formulary. Ples SpecterAlisa Jeweliana Dudgeon, RN [facility] RN)

## 2018-03-02 NOTE — Assessment & Plan Note (Signed)
Will do trial of Benzacline.  Provided instructions on how to use product and also how to care for skin.

## 2018-03-02 NOTE — Telephone Encounter (Signed)
Spoke with patient and advised her of note from Dr. Ottie GlazierGunadasa which states she put in pain management referral and rx for Lidocaine patch.

## 2018-03-04 ENCOUNTER — Emergency Department (HOSPITAL_COMMUNITY)
Admission: EM | Admit: 2018-03-04 | Discharge: 2018-03-04 | Disposition: A | Discharge status: Home / Self Care | Payer: Medicaid Other | Attending: Emergency Medicine | Admitting: Emergency Medicine

## 2018-03-04 ENCOUNTER — Encounter (HOSPITAL_COMMUNITY): Payer: Self-pay

## 2018-03-04 ENCOUNTER — Other Ambulatory Visit: Payer: Self-pay

## 2018-03-04 DIAGNOSIS — I10 Essential (primary) hypertension: Secondary | ICD-10-CM | POA: Diagnosis not present

## 2018-03-04 DIAGNOSIS — F329 Major depressive disorder, single episode, unspecified: Secondary | ICD-10-CM | POA: Diagnosis not present

## 2018-03-04 DIAGNOSIS — M5441 Lumbago with sciatica, right side: Secondary | ICD-10-CM | POA: Diagnosis not present

## 2018-03-04 DIAGNOSIS — G8929 Other chronic pain: Secondary | ICD-10-CM | POA: Diagnosis not present

## 2018-03-04 DIAGNOSIS — Z91040 Latex allergy status: Secondary | ICD-10-CM | POA: Diagnosis not present

## 2018-03-04 DIAGNOSIS — M545 Low back pain: Secondary | ICD-10-CM | POA: Diagnosis present

## 2018-03-04 DIAGNOSIS — Z79899 Other long term (current) drug therapy: Secondary | ICD-10-CM | POA: Diagnosis not present

## 2018-03-04 DIAGNOSIS — F1410 Cocaine abuse, uncomplicated: Secondary | ICD-10-CM | POA: Insufficient documentation

## 2018-03-04 MED ORDER — OXYCODONE HCL 5 MG PO TABS
10.0000 mg | ORAL_TABLET | Freq: Once | ORAL | Status: AC
  Administered 2018-03-04: 10 mg via ORAL
  Filled 2018-03-04: qty 2

## 2018-03-04 MED ORDER — KETOROLAC TROMETHAMINE 60 MG/2ML IM SOLN
15.0000 mg | Freq: Once | INTRAMUSCULAR | Status: AC
  Administered 2018-03-04: 15 mg via INTRAMUSCULAR
  Filled 2018-03-04: qty 2

## 2018-03-04 MED ORDER — LIDOCAINE 5 % EX PTCH
1.0000 | MEDICATED_PATCH | Freq: Once | CUTANEOUS | Status: DC
  Administered 2018-03-04: 1 via TRANSDERMAL
  Filled 2018-03-04: qty 1

## 2018-03-04 MED ORDER — DIAZEPAM 5 MG PO TABS
5.0000 mg | ORAL_TABLET | Freq: Once | ORAL | Status: AC
  Administered 2018-03-04: 5 mg via ORAL
  Filled 2018-03-04: qty 1

## 2018-03-04 MED ORDER — ACETAMINOPHEN 500 MG PO TABS
1000.0000 mg | ORAL_TABLET | Freq: Once | ORAL | Status: AC
  Administered 2018-03-04: 1000 mg via ORAL
  Filled 2018-03-04: qty 2

## 2018-03-04 MED ORDER — MORPHINE SULFATE (PF) 4 MG/ML IV SOLN
4.0000 mg | Freq: Once | INTRAVENOUS | Status: AC
  Administered 2018-03-04: 4 mg via INTRAMUSCULAR
  Filled 2018-03-04: qty 1

## 2018-03-04 NOTE — ED Notes (Signed)
Pt has her daughter coming to pick her up. Pt knows not to drive with the medication she has been given.
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
## 2018-03-04 NOTE — ED Notes (Signed)
Bed: [id number] Expected date:  Expected time:  Means of arrival:  Comments: 47 yo back pain

## 2018-03-04 NOTE — ED Triage Notes (Signed)
She c/o chronic back pain. She cites hx of back surgery. She denies any recent trauma. She tells us that prescribed meds for her back pain are insufficient to handle the amount of pain she is having. She was ambulatory to EMS stretcher. CMS intact all extremities.

## 2018-03-04 NOTE — Discharge Instructions (Signed)
Continue your home pain regimen.  Please call your spine doctor and pcp and let them know your pain was so bad you had to come to the ED.  Return for difficulty peeing or pooping, if your leg is numb or weak.

## 2018-03-05 ENCOUNTER — Ambulatory Visit (INDEPENDENT_AMBULATORY_CARE_PROVIDER_SITE_OTHER): Payer: Medicaid Other | Admitting: Internal Medicine

## 2018-03-05 ENCOUNTER — Encounter (HOSPITAL_COMMUNITY): Payer: Self-pay | Admitting: Emergency Medicine

## 2018-03-05 VITALS — BP 138/100 | HR 80 | Temp 99.3°F | Ht 66.0 in | Wt 192.2 lb

## 2018-03-05 DIAGNOSIS — G8929 Other chronic pain: Secondary | ICD-10-CM | POA: Diagnosis not present

## 2018-03-05 DIAGNOSIS — M5441 Lumbago with sciatica, right side: Secondary | ICD-10-CM | POA: Diagnosis not present

## 2018-03-05 DIAGNOSIS — Z86718 Personal history of other venous thrombosis and embolism: Secondary | ICD-10-CM | POA: Diagnosis not present

## 2018-03-05 DIAGNOSIS — Z79899 Other long term (current) drug therapy: Secondary | ICD-10-CM | POA: Insufficient documentation

## 2018-03-05 DIAGNOSIS — I10 Essential (primary) hypertension: Secondary | ICD-10-CM | POA: Diagnosis not present

## 2018-03-05 DIAGNOSIS — M545 Low back pain: Secondary | ICD-10-CM | POA: Diagnosis present

## 2018-03-05 NOTE — Patient Instructions (Signed)
I made a referral to the orthopedic surgeons per your request for second opinion  We ordered an MRI of your low back to further evaluate I will call you when I obtain the results.

## 2018-03-05 NOTE — Telephone Encounter (Signed)
Noted and agree. 

## 2018-03-05 NOTE — ED Triage Notes (Addendum)
Patient c/o lower back pain radiating down right leg. Seen yesterday for same. States MRI scheduled Thursday. Ambulatory.

## 2018-03-05 NOTE — Progress Notes (Signed)
   [facility] Phone: [phone]   Date of Visit: 03/05/2018   HPI:  Chronic Pain:  - had a lumbar microdiscectomy in January and she continues to have back pain with radiculopathy to the right leg.  She was seen in clinic on 3/21 for this. She was also seen in the ED yesterday for her symptoms. She was given medications for her pain while in the ED.  - she denies any bowel incontinence, saddle anesthesia, or urinary retention or incontinence.  -She reports that she is frustrated because of the pain she is having.  Seems like "no one cares about her pain and no one is providing her pain medication".   - she is taking Lyrica 75mg  TID.  She continues to require ibuprofen.  She uses Flexeril 10 mg at bedtime which helps but does not last long .  -She reports that her orthopedic doctor would not give her opioid pain medication.   - she was referred to pain management  - she has had UDS positive for cocaine and therefore is no longer getting opioid pain medication from this clinic - [facility].   ROS: See HPI.  PMFSH:  Chronic Low Back Pain s/p  lumbar microdiscectomy  HTN  PHYSICAL EXAM: BP (!) 138/100 (BP Location: Right Arm, Patient Position: Sitting, Cuff Size: Normal)   Pulse 80   Temp 99.3 F (37.4 C) (Oral)   Ht 5\' 6"  (1.676 m)   Wt 192 lb 3.2 oz (87.2 kg)   LMP 02/12/2018 (Approximate)   SpO2 99%   BMI 31.02 kg/m  GEN: NAD CV: RRR, no murmurs, rubs, or gallops PULM: CTAB, normal effort ABD: Soft, nontender, nondistended, NABS, no organomegaly SKIN: No rash or cyanosis; warm and well-perfused MSK: no midline tenderness of spine. Some right paraspinal tenderness to palpation at the level of the lumbar spine. Normal sensation to light touch in bilateral lower extremities. 4/5 strength in the right lower extremity, 5/5 strength in the left lower extremity. Sitting straight leg raise does  not cause pain to radiate to the foot but does cause posterior thigh pain on the right lower extremity.  EXTR: No lower extremity edema or calf tenderness PSYCH: Mood and affect euthymic, normal rate and volume of speech NEURO: Awake, alert, no focal deficits grossly, normal speech   ASSESSMENT/PLAN:  Chronic right-sided low back pain, with sciatica presence unspecified No signs of cauda equina. She would like a second opinion by orthopedics. [facility] Will obtain MRI lumbar spine since last image was done before surgery. Prior authorization completed for Lidocaine patch.  - Ambulatory referral to Orthopedic Surgery - MR Lumbar Spine Wo Contrast; Future   Palma HolterKanishka G Jazper Nikolai, MD PGY 3 [facility]

## 2018-03-05 NOTE — Telephone Encounter (Signed)
Prior approval for Lidocaine patch completed via [employer] [employer].  Med approved for 03/05/18 - 03/05/19 Prior approval # [id number] [facility] informed. Shawna OrleansMeredith B Kekoa Fyock, RN

## 2018-03-05 NOTE — Telephone Encounter (Signed)
Form completed and provided to RN team

## 2018-03-06 ENCOUNTER — Ambulatory Visit: Payer: Medicaid Other

## 2018-03-06 ENCOUNTER — Emergency Department (HOSPITAL_COMMUNITY)
Admission: EM | Admit: 2018-03-06 | Discharge: 2018-03-06 | Disposition: A | Discharge status: Home / Self Care | Payer: Medicaid Other | Attending: Emergency Medicine | Admitting: Emergency Medicine

## 2018-03-06 ENCOUNTER — Other Ambulatory Visit: Payer: Self-pay

## 2018-03-06 DIAGNOSIS — M5431 Sciatica, right side: Secondary | ICD-10-CM

## 2018-03-06 MED ORDER — OXYCODONE-ACETAMINOPHEN 5-325 MG PO TABS
1.0000 | ORAL_TABLET | Freq: Once | ORAL | Status: AC
  Administered 2018-03-06: 1 via ORAL
  Filled 2018-03-06: qty 1

## 2018-03-06 MED ORDER — OXYCODONE-ACETAMINOPHEN 5-325 MG PO TABS: 1.0000 | ORAL_TABLET | Freq: Two times a day (BID) | ORAL | 0 refills | Status: DC | PRN

## 2018-03-06 MED ORDER — DEXAMETHASONE SODIUM PHOSPHATE 10 MG/ML IJ SOLN
10.0000 mg | Freq: Once | INTRAMUSCULAR | Status: AC
  Administered 2018-03-06: 10 mg via INTRAMUSCULAR
  Filled 2018-03-06: qty 1

## 2018-03-06 NOTE — Telephone Encounter (Signed)
Pt called nurse line requesting BP medication refill to [facility] [address]  Her call back [phone] Shawna OrleansMeredith B Jeniel Slauson, RN

## 2018-03-06 NOTE — Discharge Instructions (Signed)
Please see her primary care doctor for optimal evaluation of what appears to be sciatica or radicular pain.  Physical therapy and anti-inflammatory medications are typically how you treat these pain - close primary care doctor is recommended. We will give you percocet this time only for break through pain.
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
## 2018-03-07 MED ORDER — HYDROCHLOROTHIAZIDE 12.5 MG PO CAPS: 12.5000 mg | ORAL_CAPSULE | Freq: Every day | ORAL | 1 refills | Status: DC

## 2018-03-08 ENCOUNTER — Telehealth: Payer: Self-pay | Admitting: Psychology

## 2018-03-08 ENCOUNTER — Ambulatory Visit (HOSPITAL_COMMUNITY)
Admission: RE | Admit: 2018-03-08 | Discharge: 2018-03-08 | Disposition: A | Discharge status: Home / Self Care | Payer: Medicaid Other | Source: Ambulatory Visit | Attending: Family Medicine | Admitting: Family Medicine

## 2018-03-08 DIAGNOSIS — G8929 Other chronic pain: Secondary | ICD-10-CM | POA: Diagnosis not present

## 2018-03-08 DIAGNOSIS — Z9889 Other specified postprocedural states: Secondary | ICD-10-CM | POA: Insufficient documentation

## 2018-03-08 DIAGNOSIS — M545 Low back pain: Secondary | ICD-10-CM | POA: Diagnosis not present

## 2018-03-08 NOTE — Telephone Encounter (Signed)
Patient called to reschedule missed appointment.  She was in the ED for back pain the morning of our appointment and has since been back again.  When I connected with her she was at the place getting ready to get an MRI.  She would like to review the paperwork she filled out with me last visit.  We will also discuss any options she has for increasing her quality of life.

## 2018-03-09 ENCOUNTER — Telehealth: Payer: Self-pay | Admitting: Internal Medicine

## 2018-03-09 NOTE — Telephone Encounter (Signed)
Please inform [name] that her MRI did not show significant bulging of her discs in her low spine.

## 2018-03-09 NOTE — Telephone Encounter (Signed)
Pt called about MRI results and would like to talk to MD about results and what to do for her pain.  Her call back [phone] Shawna OrleansMeredith B Thomsen, RN

## 2018-03-12 ENCOUNTER — Other Ambulatory Visit: Payer: Self-pay

## 2018-03-12 ENCOUNTER — Encounter: Payer: Self-pay | Admitting: Internal Medicine

## 2018-03-12 ENCOUNTER — Ambulatory Visit (INDEPENDENT_AMBULATORY_CARE_PROVIDER_SITE_OTHER): Payer: Medicaid Other | Admitting: Internal Medicine

## 2018-03-12 ENCOUNTER — Telehealth: Payer: Self-pay

## 2018-03-12 VITALS — BP 142/90 | HR 76 | Temp 98.3°F | Ht 67.0 in | Wt 187.0 lb

## 2018-03-12 DIAGNOSIS — R7309 Other abnormal glucose: Secondary | ICD-10-CM

## 2018-03-12 DIAGNOSIS — E876 Hypokalemia: Secondary | ICD-10-CM

## 2018-03-12 DIAGNOSIS — M5416 Radiculopathy, lumbar region: Secondary | ICD-10-CM

## 2018-03-12 LAB — POCT GLYCOSYLATED HEMOGLOBIN (HGB A1C): HEMOGLOBIN A1C: 5.4

## 2018-03-12 MED ORDER — MELOXICAM 15 MG PO TABS
15.0000 mg | ORAL_TABLET | Freq: Every day | ORAL | 0 refills | Status: DC
Start: 2018-03-12 — End: 2018-05-06

## 2018-03-12 NOTE — Telephone Encounter (Signed)
Pt seen today, states "the disability people" are requesting a statement be written describing her condition and the likely hood she will be getting better. Patient would like a call back from MD to discuss. Her call back [phone]

## 2018-03-12 NOTE — Assessment & Plan Note (Signed)
Reviewed MRI Lumbar spine with patient which did not show any specific cause for the pain.  we discussed that there is not likely one specific solution for her symptoms and it is unlikely that there is one specific thing we can do to completely resolve her pain. The goal for us is to control her symptoms so that she can do the things she needs to do in her life.  She is willing to start PT. She is also interested in continuing to go to behavioral health therapy.  - discontinue Ibuprofen. Start Mobic 15mg  daily for 4-5 days then PRN. Take with food. Stop if causing stomach irritation  - stop Lyrica since she has jitteriness - start Lidocaine patch that was prescribed at the last visit - referral to PT  - considered starting Cymbalta or similar to help with chronic pain, but she may have history of manic symptoms? No official diagnoses of bipolar disorder.  - encouraged continuing to go to behavioral health therapy  - follow up in 3-4 weeks.

## 2018-03-12 NOTE — Progress Notes (Signed)
[facility] Phone: [phone]   Date of Visit: 03/12/2018   HPI:  Chronic Pain:  - had a lumbar microdiscectomy in January and she continues to have back pain with radiculopathy to the right leg.  - we have been having a difficult time controlling her symptoms: we started Lyrica last month but patient had jitteriness with this medication. Prior to this, she had been on Gabapentin for a long time and it stopped working. She has tried back injections prior to her surgery which did not help. She has tried Tramadol in the past which has not helped. Her PCP would like to avoid opioids.  - she is taking Flexeril at bedtime and Ibuprofen 600mg   - we had an MRI lumbar spine done which showed the following: Interval right hemilaminectomy and discectomy at L4-5. No finding to suggest residual or recurrent disc herniation. Mild bulging of the disc. Postoperative changes of the fat in the right lateral recess and proximal foramen. - she denies saddle anesthesia, lower extremity muscle weakness, urinary incontinence/retention, or bowel incontinence  - we discussed that there is not likely one specific solution for her symptoms and it is unlikely that there is one specific thing we can do to completely resolve her pain. The goal for [place] is to control her symptoms so that she can do the things she needs to do in her life.  She is willing to start PT. She is also interested in continuing to go to behavioral health therapy.   ROS: See HPI.  PMFSH:  PMH: HTN Chronic Low back pain with radiculopathy  Acne HLD Hx of cocaine use  PHYSICAL EXAM: BP (!) 142/90   Pulse 76   Temp 98.3 F (36.8 C) (Oral)   Ht 5\' 7"  (1.702 m)   Wt 187 lb (84.8 kg)   LMP 02/12/2018 (Approximate)   SpO2 98%   BMI 29.29 kg/m  GEN: NAD CV: RRR, no murmurs, rubs, or gallops PULM: CTAB, normal effort MSK: no midline tenderness to palpation, right paraspinal tenderness to palpation at the level of  the lumbar spine. Normal strength in the left lower extremity is 5/5. Strength 4/5 in the right lower extremity. Normal sensation to light touch in bilateral lower extremities.  SKIN: No rash or cyanosis; warm and well-perfused EXTR: No lower extremity edema or calf tenderness PSYCH: Mood and affect euthymic, normal rate and volume of speech NEURO: Awake, alert, no focal deficits grossly, normal speech  ASSESSMENT/PLAN:  Health maintenance:  - due for Tdap. Patient would like to read about vaccine first. Information given.   Right lumbar radiculopathy Reviewed MRI Lumbar spine with patient which did not show any specific cause for the pain.  we discussed that there is not likely one specific solution for her symptoms and it is unlikely that there is one specific thing we can do to completely resolve her pain. The goal for [place] is to control her symptoms so that she can do the things she needs to do in her life.  She is willing to start PT. She is also interested in continuing to go to behavioral health therapy.  - discontinue Ibuprofen. Start Mobic 15mg  daily for 4-5 days then PRN. Take with food. Stop if causing stomach irritation  - stop Lyrica since she has jitteriness - start Lidocaine patch that was prescribed at the last visit - referral to PT  - considered starting Cymbalta or similar to help with chronic pain, but she may have history of  manic symptoms? No official diagnoses of bipolar disorder.  - encouraged continuing to go to behavioral health therapy  - follow up in 3-4 weeks.   Palma HolterKanishka G Gunadasa, MD PGY 3 [facility]

## 2018-03-12 NOTE — Telephone Encounter (Signed)
Has an appointment with me today

## 2018-03-12 NOTE — Patient Instructions (Addendum)
1. Stop taking Ibuprofen. We will try Mobic 15mg  daily for 4-5 days, then as needed. Please take with food. If it irritates your stomach, please stop taking.   2. Start the lidocaine patch   3. Let's start Physical therapy   I am glad you are going to see behavioral health.   Follow up in 3- 4 weeks

## 2018-03-13 ENCOUNTER — Telehealth: Payer: Self-pay

## 2018-03-13 ENCOUNTER — Ambulatory Visit (INDEPENDENT_AMBULATORY_CARE_PROVIDER_SITE_OTHER): Payer: Medicaid Other | Admitting: Psychology

## 2018-03-13 DIAGNOSIS — F418 Other specified anxiety disorders: Secondary | ICD-10-CM

## 2018-03-13 LAB — BASIC METABOLIC PANEL WITH GFR
BUN/Creatinine Ratio: 14 (ref 9–23)
BUN: 11 mg/dL (ref 6–24)
CO2: 26 mmol/L (ref 20–29)
Calcium: 10 mg/dL (ref 8.7–10.2)
Chloride: 97 mmol/L (ref 96–106)
Creatinine, Ser: 0.77 mg/dL (ref 0.57–1.00)
GFR calc Af Amer: 107 mL/min/1.73
GFR calc non Af Amer: 93 mL/min/1.73
Glucose: 83 mg/dL (ref 65–99)
Potassium: 3.7 mmol/L (ref 3.5–5.2)
Sodium: 140 mmol/L (ref 134–144)

## 2018-03-13 MED ORDER — LIDOCAINE 5 % EX PTCH: 1.0000 | MEDICATED_PATCH | CUTANEOUS | 0 refills | Status: DC

## 2018-03-13 NOTE — Telephone Encounter (Signed)
New rx sent

## 2018-03-13 NOTE — Telephone Encounter (Signed)
[facility] is still unable to get Lidoderm patch to go through [employer] even though approval has been obtained. Pharmacist states the 1.8% strength is uncommon and they are unable to obtain.  Pharmacist request new Rx for Lidoderm/Lidocaine patch 5% be sent. A new PA may be requested but this should be able to be obtained and go through since it is a common strength.  Ples SpecterAlisa Mykal Kirchman, RN [facility] RN)

## 2018-03-13 NOTE — Telephone Encounter (Signed)
Letter printed and placed at front desk for pick up. Please inform patient.

## 2018-03-13 NOTE — Progress Notes (Signed)
Sent letter with normal BMP 

## 2018-03-13 NOTE — Patient Instructions (Signed)
Please follow-up:  April 9th at 9:45  You mentioned interest in Voc Rehab.  Here is the number:  [phone]  Consider whether taking a medicine that has been shown to help pain and mood might be helpful.  This medicine is called Duloxetine.

## 2018-03-13 NOTE — Telephone Encounter (Signed)
Patient informed.  Tansy Lorek,CMA  

## 2018-03-14 ENCOUNTER — Telehealth: Payer: Self-pay

## 2018-03-14 DIAGNOSIS — F39 Unspecified mood [affective] disorder: Secondary | ICD-10-CM

## 2018-03-14 NOTE — Progress Notes (Signed)
Reason for follow-up:  Patient presents for follow-up for chronic pain / mood issues.    Issues discussed:  Patient continues to feel unheard / unattended to from the health care system and her doctors.  When asked, she is unable to name anything they are NOT doing but does cite some frustration over a prescription that recently got denied.  That was yesterday and (unbeknownst to the patient), the physician had already taken care of it.    She continues to think about work and disability.  Finances and pain are her two biggest worries / concerns.  She was interested in reaching out to voc rehab.  In terms of function, one thing she mentioned is she hasn't been to any of her daugther's softball games at [employer] secondary to pain.

## 2018-03-14 NOTE — Telephone Encounter (Signed)
Patient left message stating she is still having a lot of pain. She is taking her meds as prescribed however she cannot get the Lidocaine patch to stay on. She wants to know what else to do to get her pain under control. Call back is [phone]. Ples SpecterAlisa Brittan Butterbaugh, RN [facility] RN)

## 2018-03-14 NOTE — Assessment & Plan Note (Addendum)
Report of mood is depressed.  Patient is frustrated.  Unrealistic expectations about medicine and what doctors can do for her is likely making things more challenging.  I highlighted the downside of being on "opposite sides" of those charged with taking care of her and pushed her to consider what they aren't doing that she wishes they would.  She was unable to pinpoint anything with the exception of some recent issues that were already being addressed.  I suspect, given her suffering, blaming someone is a method of coping.  We may be able to look at that further next time and hopefull engender a bit more teamwork.    I attempted to get her to look at one thing she could identify that helps fill her up.  She said lifting people up and encouraging them is one of those things however cited an example where she was hurt because of doing it.      Also looked at the role of medication - duloxetine in particular.  She was to complete an MDQ in the waiting room before she left and return it to front desk but as best I can tell, she did not.  Will follow up on this.  She reiterated that Bipolar Disorder is in her family but unclear where.  Her report of symptoms has been difficult to elicit in the past.  I was hoping to use the MDQ to drill down on symptoms more specifically.  Given her pain and mood issues, it may be reasonable to try duloxetine and monitor closely.  Will discuss next visit which is scheduled for next week.

## 2018-03-15 NOTE — Telephone Encounter (Signed)
Spoke with patient:  - she can try medical tape to secure the lidocaine patch  - we discussed that getting her symptoms under control is a process and unfortunately there isn't a quicker way to help control her pain. She understands. PT referral has already been placed. She saw Dr. Pascal Lux yesterday - we discussed that stress and emotional distress can contribute/worsen pain. She is agreeable to looking into this more. We discussed this briefly in a prior visit. Her MDQ seemed positive, so I feel like she may have undiagnosed bipolar disorder. Because of this we discussed avoiding anti-depressants for now (for mood and pain). She is agreeable to seeing psychiatry for this. I will make a referral.    Discussed with Dr. Pascal Lux. Recommended [facility]. [facility] team please inform patient that I made a referral and they should be calling her in the next 1.5 weeks.

## 2018-03-16 ENCOUNTER — Emergency Department (HOSPITAL_COMMUNITY)
Admission: EM | Admit: 2018-03-16 | Discharge: 2018-03-16 | Disposition: A | Discharge status: Home / Self Care | Payer: Medicaid Other | Attending: Emergency Medicine | Admitting: Emergency Medicine

## 2018-03-16 ENCOUNTER — Other Ambulatory Visit: Payer: Self-pay

## 2018-03-16 DIAGNOSIS — G8929 Other chronic pain: Secondary | ICD-10-CM | POA: Insufficient documentation

## 2018-03-16 DIAGNOSIS — M5441 Lumbago with sciatica, right side: Secondary | ICD-10-CM | POA: Diagnosis not present

## 2018-03-16 DIAGNOSIS — Z86718 Personal history of other venous thrombosis and embolism: Secondary | ICD-10-CM | POA: Diagnosis not present

## 2018-03-16 DIAGNOSIS — M545 Low back pain: Secondary | ICD-10-CM | POA: Diagnosis present

## 2018-03-16 DIAGNOSIS — Z79899 Other long term (current) drug therapy: Secondary | ICD-10-CM | POA: Diagnosis not present

## 2018-03-16 DIAGNOSIS — I10 Essential (primary) hypertension: Secondary | ICD-10-CM | POA: Insufficient documentation

## 2018-03-16 DIAGNOSIS — Z91040 Latex allergy status: Secondary | ICD-10-CM | POA: Insufficient documentation

## 2018-03-16 MED ORDER — OXYCODONE-ACETAMINOPHEN 5-325 MG PO TABS: 1.0000 | ORAL_TABLET | Freq: Four times a day (QID) | ORAL | 0 refills | Status: DC | PRN

## 2018-03-16 MED ORDER — OXYCODONE-ACETAMINOPHEN 5-325 MG PO TABS
1.0000 | ORAL_TABLET | Freq: Once | ORAL | Status: AC
Start: 2018-03-16 — End: 2018-03-16
  Administered 2018-03-16: 1 via ORAL
  Filled 2018-03-16: qty 1

## 2018-03-16 MED ORDER — CYCLOBENZAPRINE HCL 10 MG PO TABS: 10.0000 mg | ORAL_TABLET | Freq: Two times a day (BID) | ORAL | 0 refills | Status: DC | PRN

## 2018-03-16 MED ORDER — CYCLOBENZAPRINE HCL 10 MG PO TABS
10.0000 mg | ORAL_TABLET | Freq: Once | ORAL | Status: AC
  Administered 2018-03-16: 10 mg via ORAL
  Filled 2018-03-16: qty 1

## 2018-03-16 NOTE — ED Triage Notes (Signed)
Right lower back pain. Back surgery in Jan for herniated disc.Pt uses Lidocaine patch and muscle relaxers, but ran out of muscle relaxers.  PT supposed to start Monday, but pt cannot tolerate pain. 150/92 [id number]

## 2018-03-16 NOTE — Discharge Instructions (Signed)
Please continue with your home pain medication, you may use Percocet for breakthrough pain, you will need to follow-up with your primary doctor for continued pain medication prescription we will not be able to prescribe any more pain medications free from the emergency department.  I have attached exercises to help with your sciatica, please follow-up with physical therapy next week as planned.  Return to the emergency department for significantly worsened pain, numbness, weakness, inability to walk, fevers or chills or other new or concerning symptoms.

## 2018-03-16 NOTE — ED Notes (Signed)
Bed: [facility] Expected date:  Expected time:  Means of arrival:  Comments: EMS-back pain 
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
## 2018-03-20 ENCOUNTER — Ambulatory Visit (INDEPENDENT_AMBULATORY_CARE_PROVIDER_SITE_OTHER): Payer: Medicaid Other | Admitting: Family Medicine

## 2018-03-20 ENCOUNTER — Encounter: Payer: Self-pay | Admitting: Family Medicine

## 2018-03-20 ENCOUNTER — Other Ambulatory Visit: Payer: Self-pay

## 2018-03-20 VITALS — BP 112/64 | HR 86 | Wt 193.0 lb

## 2018-03-20 DIAGNOSIS — G8929 Other chronic pain: Secondary | ICD-10-CM | POA: Diagnosis not present

## 2018-03-20 DIAGNOSIS — M79671 Pain in right foot: Secondary | ICD-10-CM

## 2018-03-20 DIAGNOSIS — M545 Low back pain: Secondary | ICD-10-CM | POA: Diagnosis present

## 2018-03-20 DIAGNOSIS — M5416 Radiculopathy, lumbar region: Secondary | ICD-10-CM

## 2018-03-20 NOTE — Progress Notes (Signed)
    Subjective:  Lisa Newton is a 47 y.o. female who [facility] with a chief complaint of chronic back and foot pain.   HPI:  Chronic  pain Had chronic back pain s/p lumbar microdiscectomy in January which patient states allowed her to become more functional but did not have any improvement in her pain. [facility] ED recently. At last visit on 03/12/18 had referral to PT, she has appointment for this tomorrow. At that time had also discussed possible role of behavioral health and psychiatry. She states that she thought she had [facility] but when she checked in was told that she was not scheduled. She has an appointment with pain management later this month. States that no acute changes in her pain but rather than it will intermittent flare up significantly. Last episode was on 03/16/18 for which was seen in ED and given percocet 5-325mg  #6 which helped significantly.  She is able to walk but does need help putting on her shoes.  She states that saw sports medicine for R foot pain and at that time had discussed that could not tell if having foot pain affecting her back or back pain affecting her foot. She states that since has noticed a "ball of nerves" between her toes in R foot. Has not yet tried the exercises. No heel pain  ROS: Per HPI  Objective:  Physical Exam: BP 112/64   Pulse 86   Wt 193 lb (87.5 kg)   SpO2 99%   BMI 30.23 kg/m   Gen: NAD, sitting up in chair MSK: very TTP between 2nd and 3rd metarsal heads. No erythema or edema. Sensation intact  Skin: warm, dry, lidocaine patch in place on R lower back Neuro: grossly normal, moves all extremities, Able to 5+ steps  Psych: Normal affect and thought content   Assessment/Plan:  Right foot pain While patient does have significant back pain she does seem to have a distinct R foot pain. Unlikely to be plantar fasciitis given no heel pain or pain with dorsiflexion. She does  have exquisite point tenderness between the 2nd and 3rd metatarsal heads. Question if she has a neuroma or metatarsalgia. Recommended return to sports medicine for further evaluation that may lead to a possible steroid injection or metatarsal pad for further relief.   Right lumbar radiculopathy Patient unfortunately has continued to have significant back pain leading to multiple visits and had some relief with percocet which she received over the weekend from the ED. She has recently had an MRI Lumbar spine without any specific cause. She had not yet been to PT but does have an appointment tomorrow which will hopefully be beneficial. In addition, she is awaiting a referral to psychiatry and has an appointment for pain management later this month. She is able to function with her pain and her gait was reassuring today. Discussed that she should await these next steps and follow up as planned in the next 3-4 weeks.  Discussed that no refills for opiates today  at which patient became upset. Would not given opiates because per her PCP notes in the chart, PCP requests that patient is not given opiate medications due to history of substance abuse.    Leland HerElsia J Bethanne Mule, DO PGY-2, [facility] 03/20/2018 10:00 AM

## 2018-03-20 NOTE — Assessment & Plan Note (Addendum)
Patient unfortunately has continued to have significant back pain leading to multiple visits and had some relief with percocet which she received over the weekend from the ED. She has recently had an MRI Lumbar spine without any specific cause. She had not yet been to PT but does have an appointment tomorrow which will hopefully be beneficial. In addition, she is awaiting a referral to psychiatry and has an appointment for pain management later this month. She is able to function with her pain and her gait was reassuring today. Discussed that she should await these next steps and follow up as planned in the next 3-4 weeks.  Discussed that no refills for opiates today  at which patient became upset. Would not given opiates because per her PCP notes in the chart, PCP requests that patient is not given opiate medications due to history of substance abuse.

## 2018-03-20 NOTE — Assessment & Plan Note (Signed)
While patient does have significant back pain she does seem to have a distinct R foot pain. Unlikely to be plantar fasciitis given no heel pain or pain with dorsiflexion. She does have exquisite point tenderness between the 2nd and 3rd metatarsal heads. Question if she has a neuroma or metatarsalgia. Recommended return to sports medicine for further evaluation that may lead to a possible steroid injection or metatarsal pad for further relief.

## 2018-03-20 NOTE — Patient Instructions (Addendum)
Go back to see sports medicine for the R foot pain.  Still waiting on psychiatry.  Please call Dr. Pascal LuxKane, I will send her a message.

## 2018-03-21 ENCOUNTER — Ambulatory Visit: Payer: Medicaid Other | Admitting: Family Medicine

## 2018-03-21 ENCOUNTER — Encounter: Payer: Self-pay | Admitting: Family Medicine

## 2018-03-21 ENCOUNTER — Ambulatory Visit: Payer: Medicaid Other | Attending: Family Medicine | Admitting: Physical Therapy

## 2018-03-21 ENCOUNTER — Other Ambulatory Visit: Payer: Self-pay

## 2018-03-21 ENCOUNTER — Encounter: Payer: Self-pay | Admitting: Physical Therapy

## 2018-03-21 DIAGNOSIS — M545 Low back pain: Secondary | ICD-10-CM | POA: Diagnosis present

## 2018-03-21 DIAGNOSIS — G8929 Other chronic pain: Secondary | ICD-10-CM | POA: Diagnosis present

## 2018-03-21 DIAGNOSIS — M79604 Pain in right leg: Secondary | ICD-10-CM | POA: Diagnosis present

## 2018-03-21 DIAGNOSIS — M5416 Radiculopathy, lumbar region: Secondary | ICD-10-CM

## 2018-03-21 DIAGNOSIS — M79671 Pain in right foot: Secondary | ICD-10-CM

## 2018-03-21 NOTE — Patient Instructions (Signed)
  Tennis ball 5 min 2/day

## 2018-03-21 NOTE — Patient Instructions (Signed)
You may have metatarsalgia or referred pain mimicking this. You were given sports insoles with metatarsal pads to treat this. Wear these in shoes at all times. Avoid flat shoes, barefoot walking as much as possible. If they don't feel right give us a call (if they don't fit properly in your shoe or if in your shoes the pads feel too far forward but they look good). Follow up with me in 1 month for reevaluation.
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
## 2018-03-26 ENCOUNTER — Encounter: Payer: Self-pay | Admitting: Family Medicine

## 2018-03-26 NOTE — Assessment & Plan Note (Signed)
consistent with metatarsalgia or referred pain mimicking metatarsalgia.  Made the patient sports insoles with metatarsal pads.  Advised to wear these and she was at all times though unfortunately she is not wearing shoes with her at this time and I can fit these in there.  Avoid flat shoes and barefoot walking as much as possible.  Advised if these do not feel right to give us a call for an adjustment and to bring shoes at the inserts would fit into.  Continue Norco, Flexeril.  Ibuprofen or Aleve.  Follow-up in 1 month.

## 2018-03-26 NOTE — Progress Notes (Signed)
PCP: Doreene Eland, MD Consultation requested by:  Dr. Ottie Glazier MD  Subjective:   HPI: Patient is a 47 y.o. female here for right foot pain.  3/13: Patient reports that she has had about 6-7 months of plantar right foot pain. She reports pain is worse with walking up to a 6 out of 10 level and sharp. She has tried Norco, Flexeril, ibuprofen 800 mg without much benefit.  Just prescribed lyrica but hasn't taken yet. She reports recently having had lumbar spine surgery on January 3 and her current pain predates that. Pain is associated with pain in her right hip into her right knee and also into the foot.  4/10: Patient returns with pain in right foot worse than last visit. Pain is sharp, now further into foot more in forefoot. Worse with walking. Notices with driving. Tried dr. Jari Sportsman inserts which help. She lost her arch binder. Not doing home exercise program for this. No skin changes.  Past Medical History:  Diagnosis Date  . Arthritis   . Depression 01/11/2012  . DVT (deep venous thrombosis) ([facility]) 04/07/2015  . Hypertension   . Left ear hearing loss   . Neck pain   . PONV (postoperative nausea and vomiting)     Current Outpatient Medications on File Prior to Visit  Medication Sig Dispense Refill  . acetaminophen (TYLENOL) 325 MG tablet Take 650 mg by mouth every 6 (six) hours as needed for moderate pain or headache.    [name] atorvastatin (LIPITOR) 20 MG tablet Take 1 tablet (20 mg total) by mouth daily. (Patient not taking: Reported on 11/29/2017) 90 tablet 3  . clindamycin-benzoyl peroxide (BENZACLIN) gel Apply topically 2 (two) times daily. (Patient not taking: Reported on 03/16/2018) 25 g 0  . cyclobenzaprine (FLEXERIL) 10 MG tablet Take 1 tablet (10 mg total) by mouth 2 (two) times daily as needed for muscle spasms. 20 tablet 0  . cyclobenzaprine (FLEXERIL) 5 MG tablet Take 1 tablet (5 mg total) by mouth at bedtime. 60 tablet 2  . hydrochlorothiazide (MICROZIDE) 12.5 MG  capsule Take 1 capsule (12.5 mg total) by mouth daily. Hold if BP is consistently less than 130/80 30 capsule 1  . hydrOXYzine (VISTARIL) 25 MG capsule Take 1 capsule (25 mg total) by mouth every 8 (eight) hours as needed for anxiety (Insomnia). 1 PO 1 hour prior to procedure (Patient not taking: Reported on 03/16/2018) 30 capsule 0  . ibuprofen (ADVIL,MOTRIN) 800 MG tablet Take 800 mg by mouth every 6 (six) hours as needed for moderate pain.    [name] lidocaine (LIDODERM) 5 % Place 1 patch onto the skin daily. Remove & Discard patch within 12 hours or as directed by MD 30 patch 0  . meloxicam (MOBIC) 15 MG tablet Take 1 tablet (15 mg total) by mouth daily. Daily 4-5 days, then use as needed. 30 tablet 0  . Menthol (ICY HOT BACK) 5 % PTCH Apply 1 patch topically daily as needed (BACK PAIN).    [name] oxyCODONE-acetaminophen (PERCOCET) 5-325 MG tablet Take 1 tablet by mouth every 6 (six) hours as needed. 6 tablet 0   No current facility-administered medications on file prior to visit.     Past Surgical History:  Procedure Laterality Date  . cholesterol granuloma     left ear  . left ear surgery     ruptured TM  . LUMBAR LAMINECTOMY/DECOMPRESSION MICRODISCECTOMY Right 12/14/2017   Procedure: MICRODISCECTOMY LUMBAR FOUR- LUMBAR FIVE - RIGHT;  Surgeon: Coletta Memos, MD;  [facility];  Service: Neurosurgery;  Laterality: Right;  MICRODISCECTOMY LUMBAR 4- LUMBAR 5 - RIGHT  . POSTERIOR CERVICAL FUSION/FORAMINOTOMY N/A 03/14/2017   Procedure: LEFT C6-7 FORAMINOTOMY WITH EXCISION OF HERNIATED NUCLEUS PULPOSUS;  Surgeon: Kerrin Champagne, MD;  Location: [facility];  Service: Orthopedics;  Laterality: N/A;    Allergies  Allergen Reactions  . Augmentin [Amoxicillin-Pot Clavulanate] Hives and Other (See Comments)    Took Bactrim and Augmentin together and broke out in hives.Marland KitchenMarland KitchenPt says penicillin/amoxicillin previously without any problems.the patient is unable to answer any penicillin questions as it is unknown exactly  what medication broke her out in hives.  . Bactrim [Sulfamethoxazole-Trimethoprim] Hives and Other (See Comments)    Took Bactrim and Augmentin together and broke out in hives.Marland KitchenMarland KitchenPt says she took bactrim previously without any problems, she did state that she had just been released after a course of vancomycin iv and didn't know if that hadn't affected the bactrim.   . Latex Hives  . Pollen Extract     UNSPECIFIED REACTION   . Hydrocodone-Acetaminophen Nausea And Vomiting    GI upset  . Tape Rash    Social History   Socioeconomic History  . Marital status: Single    Spouse name: Not on file  . Number of children: 3  . Years of education: College  . Highest education level: Not on file  Occupational History  . Occupation: Unemployed  Social Needs  . Financial resource strain: Not on file  . Food insecurity:    Worry: Not on file    Inability: Not on file  . Transportation needs:    Medical: Not on file    Non-medical: Not on file  Tobacco Use  . Smoking status: Never Smoker  . Smokeless tobacco: Never Used  Substance and Sexual Activity  . Alcohol use: Yes    Alcohol/week: 0.0 oz    Comment: Rarely   . Drug use: Yes    Types: Marijuana    Comment: tried for pain a few weeks ago  . Sexual activity: Yes    Birth control/protection: None  Lifestyle  . Physical activity:    Days per week: Not on file    Minutes per session: Not on file  . Stress: Not on file  Relationships  . Social connections:    Talks on phone: Not on file    Gets together: Not on file    Attends religious service: Not on file    Active member of club or organization: Not on file    Attends meetings of clubs or organizations: Not on file    Relationship status: Not on file  . Intimate partner violence:    Fear of current or ex partner: Not on file    Emotionally abused: Not on file    Physically abused: Not on file    Forced sexual activity: Not on file  Other Topics Concern  . Not on file   Social History Narrative   Lives at home with her daughter.   Right-handed.   1 cup caffeine daily.    Family History  Problem Relation Age of Onset  . Cancer Mother        lung cancer  . Diabetes Father   . Hypertension Father     BP (!) 150/78   Ht 5\' 6"  (1.676 m)   Wt 186 lb (84.4 kg)   BMI 30.02 kg/m   Review of Systems: See HPI above.     Objective:  Physical Exam:  Gen: NAD, comfortable in exam room  Right foot/ankle: No gross deformity, swelling, ecchymoses FROM with 5/5 strength all motions. TTP now focused in forefoot at 2nd-4th metatarsal heads and just proximal to this.  Tenderness plantar and dorsal foot. Pain metatarsal squeeze. Negative ant drawer and talar tilt.   Negative syndesmotic compression. Thompsons test negative. NV intact distally.  MSK u/s right foot:  No cortical irregularity of 2nd-4th metatarsals.  On squeeze no visible neuroma seen between MT heads.  Assessment & Plan:  1. Right foot pain -consistent with metatarsalgia or referred pain mimicking metatarsalgia.  Made the patient sports insoles with metatarsal pads.  Advised to wear these and she was at all times though unfortunately she is not wearing shoes with her at this time and I can fit these in there.  Avoid flat shoes and barefoot walking as much as possible.  Advised if these do not feel right to give us a call for an adjustment and to bring shoes at the inserts would fit into.  Continue Norco, Flexeril.  Ibuprofen or Aleve.  Follow-up in 1 month.

## 2018-03-27 ENCOUNTER — Telehealth: Payer: Self-pay

## 2018-03-27 NOTE — Telephone Encounter (Signed)
Pt calling nurse line, states she is still having increased back pain and pain in her leg. Using pain patches and muscle relaxer with not much relief. Some days are "unbearable." Saw Dr. Artist PaisYoo 03/20/18 for this issue, will forward to MD for review. Pt call back [phone] Shawna OrleansMeredith B Thomsen, RN

## 2018-03-28 NOTE — Telephone Encounter (Signed)
Patient has chronic low back pain with R sided sciatica s/p lumbar microdiscectomy in January last seen by me for same day visit on 03/20/18. Called patient back regarding her phone message. Patient states that having more pain than usual due to the recent rain but no new symptoms on her R side. She is however having new L sided pain which she has never had before in the past. No injury. No bowel or bladder incontinence. No focal weakness. Discussed that she should be evaluated for the new L sided pain to rule out a significant pathology so appointment made for 04/02/18 with Dr. Parke SimmersBland.   She did continue to discuss her chronic R back/leg and R foot pain which she is being appropriately treated. However, per the most recent sports medicine note she was given sports insoles with metatarsal pads but she did not have her shoes with her so she placed them herself. Discussed that perhaps she may benefit from returning for adjustment of the insoles or to ensure that she placed them correctly.

## 2018-03-31 ENCOUNTER — Emergency Department (HOSPITAL_COMMUNITY): Payer: Medicaid Other

## 2018-03-31 ENCOUNTER — Encounter (HOSPITAL_COMMUNITY): Payer: Self-pay | Admitting: Emergency Medicine

## 2018-03-31 ENCOUNTER — Emergency Department (HOSPITAL_COMMUNITY)
Admission: EM | Admit: 2018-03-31 | Discharge: 2018-04-01 | Disposition: A | Discharge status: Home / Self Care | Payer: Medicaid Other | Attending: Emergency Medicine | Admitting: Emergency Medicine

## 2018-03-31 ENCOUNTER — Other Ambulatory Visit: Payer: Self-pay

## 2018-03-31 DIAGNOSIS — I10 Essential (primary) hypertension: Secondary | ICD-10-CM | POA: Diagnosis not present

## 2018-03-31 DIAGNOSIS — Z79899 Other long term (current) drug therapy: Secondary | ICD-10-CM | POA: Insufficient documentation

## 2018-03-31 DIAGNOSIS — R0789 Other chest pain: Secondary | ICD-10-CM | POA: Insufficient documentation

## 2018-03-31 DIAGNOSIS — G8929 Other chronic pain: Secondary | ICD-10-CM

## 2018-03-31 DIAGNOSIS — F329 Major depressive disorder, single episode, unspecified: Secondary | ICD-10-CM | POA: Insufficient documentation

## 2018-03-31 DIAGNOSIS — M545 Low back pain: Secondary | ICD-10-CM | POA: Diagnosis present

## 2018-03-31 DIAGNOSIS — M549 Dorsalgia, unspecified: Secondary | ICD-10-CM | POA: Diagnosis not present

## 2018-03-31 LAB — CBC
HEMATOCRIT: 38.4 % (ref 36.0–46.0)
HEMOGLOBIN: 13.3 g/dL (ref 12.0–15.0)
MCH: 31.7 pg (ref 26.0–34.0)
MCHC: 34.6 g/dL (ref 30.0–36.0)
MCV: 91.4 fL (ref 78.0–100.0)
Platelets: 244 10*3/uL (ref 150–400)
RBC: 4.2 MIL/uL (ref 3.87–5.11)
RDW: 13.1 % (ref 11.5–15.5)
WBC: 9.9 10*3/uL (ref 4.0–10.5)

## 2018-03-31 LAB — BASIC METABOLIC PANEL
ANION GAP: 3 — AB (ref 5–15)
BUN: 13 mg/dL (ref 6–20)
CHLORIDE: 113 mmol/L — AB (ref 101–111)
CO2: 19 mmol/L — ABNORMAL LOW (ref 22–32)
Calcium: 9.1 mg/dL (ref 8.9–10.3)
Creatinine, Ser: 0.69 mg/dL (ref 0.44–1.00)
GFR calc Af Amer: >60 mL/min (ref >60)
GFR calc non Af Amer: >60 mL/min (ref >60)
Glucose, Bld: 94 mg/dL (ref 65–99)
POTASSIUM: 3.6 mmol/L (ref 3.5–5.1)
SODIUM: 135 mmol/L (ref 135–145)

## 2018-03-31 LAB — I-STAT BETA HCG BLOOD, ED (MC, WL, AP ONLY)

## 2018-03-31 LAB — I-STAT TROPONIN, ED: Troponin i, poc: 0 ng/mL (ref 0.00–0.08)

## 2018-03-31 NOTE — ED Notes (Signed)
Alert, NAD, calm, interactive, resps e/u, speaking in clear complete sentences, no dyspnea noted, skin W&D, c/o bilateral posterior thigh pain, R>L, (denies: sob, nausea, dizziness or visual changes). [facility]  Pt of pain management, no relief with lyrica, mobic, ibuprofen, lidocaine patch, and flexeril. Also pt of [facility].

## 2018-03-31 NOTE — ED Triage Notes (Signed)
Pt reports recurrent back pain, pain in l/side is new . New onset shooting pain, tingling and numbness in r/leg. Pt c/o sharp pain in l/chest radiating to l/shoulder. Pt c/o chest pain after transport by EMS. Pt denies nausea, denies shortness of breath.

## 2018-03-31 NOTE — ED Triage Notes (Signed)
Per EMS- pt called EMS with c/o low back pain x 1 hour. Pt stated that she has hx of same. Pt is ambulatory with supervision. C/o r/leg pain and numbness-pins and needles. Pt currently using lidocaine patch.Pt is alert, oriented and cooperative. Daughter (minor)with pt.

## 2018-04-01 LAB — D-DIMER, QUANTITATIVE: D-Dimer, Quant: 0.46 ug/mL-FEU (ref 0.00–0.50)

## 2018-04-01 LAB — I-STAT TROPONIN, ED: TROPONIN I, POC: 0 ng/mL (ref 0.00–0.08)

## 2018-04-01 MED ORDER — MORPHINE SULFATE (PF) 4 MG/ML IV SOLN
4.0000 mg | Freq: Once | INTRAVENOUS | Status: AC
  Administered 2018-04-01: 4 mg via INTRAVENOUS
  Filled 2018-04-01: qty 1

## 2018-04-01 MED ORDER — OXYCODONE-ACETAMINOPHEN 5-325 MG PO TABS: 2.0000 | ORAL_TABLET | Freq: Three times a day (TID) | ORAL | 0 refills | Status: DC | PRN

## 2018-04-01 MED ORDER — ONDANSETRON HCL 4 MG/2ML IJ SOLN
4.0000 mg | Freq: Once | INTRAMUSCULAR | Status: AC
  Administered 2018-04-01: 4 mg via INTRAVENOUS
  Filled 2018-04-01: qty 2

## 2018-04-01 MED ORDER — KETOROLAC TROMETHAMINE 30 MG/ML IJ SOLN
30.0000 mg | Freq: Once | INTRAMUSCULAR | Status: AC
  Administered 2018-04-01: 30 mg via INTRAVENOUS
  Filled 2018-04-01: qty 1

## 2018-04-01 NOTE — ED Provider Notes (Signed)
TIME SEEN: 12:15 AM  CHIEF COMPLAINT: Chest pain, back pain  HPI: Patient is a 47 year old female with history of DVT in her upper extremity after surgery in 2016 no longer on anticoagulation, hypertension who presents to the emergency department with 2 complaints.  Patient's main concern seems to be lower back pain.  She has had previous surgeries by Dr. Otelia SergeantNitka and Dr. Franky Machoabbell and states she has had back pain ongoing for several weeks.  No injury that she can recall.  No fever, numbness, tingling or focal weakness.  Pain is worse with movement and ambulation.  Pain does radiate down both legs.  States she has an appointment to see her primary care physician in 2 days and a pain management specialist in 4 days.  States she has been using Lidoderm patches and other medications without relief.   Patient also states while in the waiting room she developed chest pain.  Described as lasting for 20 minutes, left-sided and sharp in nature with radiation down the arm.  She felt short of breath with this.  She has no history of diabetes, hyperlipidemia or tobacco use.  No history of pulmonary embolus.  Her previous DVT was in her left upper extremity and was in the setting of recent surgery.  She has no chest pain currently.  ROS: See HPI Constitutional: no fever  Eyes: no drainage  ENT: no runny nose   Cardiovascular:   chest pain  Resp: SOB  GI: no vomiting GU: no dysuria Integumentary: no rash  Allergy: no hives  Musculoskeletal: no leg swelling  Neurological: no slurred speech ROS otherwise negative  PAST MEDICAL HISTORY/PAST SURGICAL HISTORY:  Past Medical History:  Diagnosis Date  . Arthritis   . Depression 01/11/2012  . DVT (deep venous thrombosis) (HCC) 04/07/2015  . Hypertension   . Left ear hearing loss   . Neck pain   . PONV (postoperative nausea and vomiting)     MEDICATIONS:  Prior to Admission medications   Medication Sig Start Date End Date Taking? Authorizing Provider   acetaminophen (TYLENOL) 325 MG tablet Take 650 mg by mouth every 6 (six) hours as needed for moderate pain or headache.    [provider]  atorvastatin (LIPITOR) 20 MG tablet Take 1 tablet (20 mg total) by mouth daily. Patient not taking: Reported on 11/29/2017 01/02/17   Doreene ElandEniola, Kehinde T, MD  clindamycin-benzoyl peroxide [facility](BENZACLIN) gel Apply topically 2 (two) times daily. Patient not taking: Reported on 03/16/2018 03/01/18   Palma HolterGunadasa, Kanishka G, MD  cyclobenzaprine (FLEXERIL) 10 MG tablet Take 1 tablet (10 mg total) by mouth 2 (two) times daily as needed for muscle spasms. 03/16/18   Dartha LodgeFord, Kelsey N, PA-C  cyclobenzaprine (FLEXERIL) 5 MG tablet Take 1 tablet (5 mg total) by mouth at bedtime. 12/15/17   Costella, Darci CurrentVincent J, PA-C  hydrochlorothiazide (MICROZIDE) 12.5 MG capsule Take 1 capsule (12.5 mg total) by mouth daily. Hold if BP is consistently less than 130/80 03/07/18   Chambliss, Estill BattenMarshall L, MD  hydrOXYzine (VISTARIL) 25 MG capsule Take 1 capsule (25 mg total) by mouth every 8 (eight) hours as needed for anxiety (Insomnia). 1 PO 1 hour prior to procedure Patient not taking: Reported on 03/16/2018 11/17/17   Doreene ElandEniola, Kehinde T, MD  ibuprofen (ADVIL,MOTRIN) 800 MG tablet Take 800 mg by mouth every 6 (six) hours as needed for moderate pain.    [provider]  lidocaine (LIDODERM) 5 % Place 1 patch onto the skin daily. Remove & Discard patch within  12 hours or as directed by MD 03/13/18   Palma Holter, MD  meloxicam (MOBIC) 15 MG tablet Take 1 tablet (15 mg total) by mouth daily. Daily 4-5 days, then use as needed. 03/12/18   Palma Holter, MD  Menthol (ICY HOT BACK) 5 % PT[facility] Apply 1 patch topically daily as needed (BACK PAIN).    [provider]  oxyCODONE-acetaminophen (PERCOCET) 5-325 MG tablet Take 1 tablet by mouth every 6 (six) hours as needed. 03/16/18   Dartha Lodge, PA-C    ALLERGIES:  Allergies  Allergen Reactions  . Augmentin [Amoxicillin-Pot  Clavulanate] Hives and Other (See Comments)    Took Bactrim and Augmentin together and broke out in hives.Marland KitchenMarland KitchenPt says penicillin/amoxicillin previously without any problems.the patient is unable to answer any penicillin questions as it is unknown exactly what medication broke her out in hives.  . Bactrim [Sulfamethoxazole-Trimethoprim] Hives and Other (See Comments)    Took Bactrim and Augmentin together and broke out in hives.Marland KitchenMarland KitchenPt says she took bactrim previously without any problems, she did state that she had just been released after a course of vancomycin iv and didn't know if that hadn't affected the bactrim.   . Latex Hives  . Pollen Extract     UNSPECIFIED REACTION   . Hydrocodone-Acetaminophen Nausea And Vomiting    GI upset  . Tape Rash    SOCIAL HISTORY:  Social History   Tobacco Use  . Smoking status: Never Smoker  . Smokeless tobacco: Never Used  Substance Use Topics  . Alcohol use: Yes    Alcohol/week: 0.0 oz    Comment: Rarely     FAMILY HISTORY: Family History  Problem Relation Age of Onset  . Cancer Mother        lung cancer  . Diabetes Father   . Hypertension Father     EXAM: BP 133/87 (BP Location: Right Arm)   Pulse 85   Temp 99.1 F (37.3 C) (Oral)   Resp 18   Wt 87.1 kg (192 lb)   LMP 02/21/2018 (Approximate)   SpO2 100%   BMI 30.99 kg/m  CONSTITUTIONAL: Alert and oriented and responds appropriately to questions. Well-appearing; well-nourished HEAD: Normocephalic EYES: Conjunctivae clear, pupils appear equal, EOMI ENT: normal nose; moist mucous membranes NECK: Supple, no meningismus, no nuchal rigidity, no LAD  CARD: RRR; S1 and S2 appreciated; no murmurs, no clicks, no rubs, no gallops RESP: Normal chest excursion without splinting or tachypnea; breath sounds clear and equal bilaterally; no wheezes, no rhonchi, no rales, no hypoxia or respiratory distress, speaking full sentences ABD/GI: Normal bowel sounds; non-distended; soft, non-tender, no  rebound, no guarding, no peritoneal signs, no hepatosplenomegaly BACK:  The back appears normal and is non-tender to palpation, there is no CVA tenderness, there is no midline spinal tenderness or step-off or deformity, no lesions noted on the back, no ecchymosis or erythema EXT: Normal ROM in all joints; non-tender to palpation; no edema; normal capillary refill; no cyanosis, no calf tenderness or swelling    SKIN: Normal color for age and race; warm; no rash NEURO: Moves all extremities equally, sensation to light touch intact diffusely, cranial nerves II through XII intact, normal speech, no saddle anesthesia PSYCH: The patient's mood and manner are appropriate. Grooming and personal hygiene are appropriate.  MEDICAL DECISION MAKING: Patient here with back pain.  This seems to be a chronic issue for her and she has radiculopathy.  No focal neurologic deficits or red flag symptoms to suggest cauda equina,  epidural abscess or hematoma, discitis or osteomyelitis, transverse myelitis.  She has had an MRI of her lumbar spine without contrast on March 08, 2018 which showed postoperative changes but no acute finding to suggest residual or recurrent disc herniation.  She had interval right hemilaminectomy and discectomy at L4-L5.  I do not feel she needs repeat emergent imaging today.  She states this pain is the same and is unchanged.   Patient also complaining of chest pain.  Has some risk factors for ACS but pain seems atypical.  First troponin is negative.  EKG shows no ischemic abnormality.  Chest x-ray clear.  Plan is to repeat second troponin and obtain d-dimer given her previous history.  Doubt dissection.  ED PROGRESS: Patient's d-dimer is negative.  Second troponin is negative.  Pain has been well controlled and she has been able to ambulate.  It appears that she does receive frequent narcotic prescriptions.  She last received 6 oxycodone tablets on 03/17/18, 6 oxycodone tablets on 03/07/18.  I  discussed with her that I will give her enough pain medication to get her to her appointment tomorrow with her primary care physician.  She states she has her first pain management appointment scheduled this week.  At this time, I do not feel there is any life-threatening condition present. I have reviewed and discussed all results (EKG, imaging, lab, urine as appropriate) and exam findings with patient/family. I have reviewed nursing notes and appropriate previous records.  I feel the patient is safe to be discharged home without further emergent workup and can continue workup as an outpatient as needed. Discussed usual and customary return precautions. Patient/family verbalize understanding and are comfortable with this plan.  Outpatient follow-up has been provided if needed. All questions have been answered.    EKG Interpretation  Date/Time:  Saturday March 31 2018 22:23:11 EDT Ventricular Rate:  84 PR Interval:    QRS Duration: 67 QT Interval:  349 QTC Calculation: 413 R Axis:   68 Text Interpretation:  Sinus rhythm No significant change since last tracing Confirmed by Ward, Baxter Hire [phone]) on 03/31/2018 11:34:30 PM         Ward, Layla Maw, DO 04/01/18 6045

## 2018-04-01 NOTE — ED Notes (Signed)
EDP into room 

## 2018-04-01 NOTE — ED Notes (Signed)
EDP updating pt on results and d/c plan.

## 2018-04-01 NOTE — ED Notes (Signed)
Pt was ambulated, but did have weakness on return to room.

## 2018-04-01 NOTE — Discharge Instructions (Signed)
I am discharging you with enough pain medication to make it until your primary care appointment tomorrow.  It is not appropriate to continue to return to the emergency department for pain medication for your chronic back pain.  Your pain now needs to be managed by your primary care physician and your pain management specialist.  Your cardiac labs today were normal.  You had a negative d-dimer which rules out blood clots.  Your chest x-ray was clear.  Your EKG showed no sign of heart attack.  Please follow-up with your primary care physician for your episode of chest pain today.

## 2018-04-01 NOTE — ED Notes (Signed)
No changes. Alert, NAD, calm, interactive.

## 2018-04-02 ENCOUNTER — Ambulatory Visit: Payer: Medicaid Other | Admitting: Family Medicine

## 2018-04-02 ENCOUNTER — Other Ambulatory Visit: Payer: Self-pay | Admitting: Family Medicine

## 2018-04-03 ENCOUNTER — Encounter: Payer: Self-pay | Admitting: Physical Therapy

## 2018-04-03 ENCOUNTER — Ambulatory Visit: Payer: Medicaid Other | Admitting: Physical Therapy

## 2018-04-03 DIAGNOSIS — M5416 Radiculopathy, lumbar region: Secondary | ICD-10-CM | POA: Diagnosis not present

## 2018-04-03 DIAGNOSIS — G8929 Other chronic pain: Secondary | ICD-10-CM

## 2018-04-03 DIAGNOSIS — M79604 Pain in right leg: Secondary | ICD-10-CM

## 2018-04-03 DIAGNOSIS — M545 Low back pain: Secondary | ICD-10-CM
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
## 2018-04-04 ENCOUNTER — Ambulatory Visit: Payer: Medicaid Other | Admitting: Family Medicine

## 2018-04-05 ENCOUNTER — Ambulatory Visit: Payer: Medicaid Other | Admitting: Physical Therapy

## 2018-04-05 ENCOUNTER — Encounter: Payer: Self-pay | Admitting: Physical Therapy

## 2018-04-05 DIAGNOSIS — M5416 Radiculopathy, lumbar region: Secondary | ICD-10-CM | POA: Diagnosis not present

## 2018-04-05 DIAGNOSIS — M79604 Pain in right leg: Secondary | ICD-10-CM
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
## 2018-04-06 ENCOUNTER — Other Ambulatory Visit: Payer: Self-pay | Admitting: Internal Medicine

## 2018-04-09 ENCOUNTER — Telehealth: Payer: Self-pay | Admitting: Physical Therapy

## 2018-04-09 ENCOUNTER — Ambulatory Visit: Payer: Medicaid Other | Admitting: Physical Therapy

## 2018-04-09 NOTE — Telephone Encounter (Signed)
Spoke with pt regarding no show- she reports she was hurting and by the time she fell asleep she slept past 8:00. Advised that MCD auth ends on the 5th. Pt spoke with scheduler but did not make an appointment and says she will call us back to schedule. Braxton Weisbecker C. Kolleen Ochsner PT, DPT 04/09/18 3:18 PM

## 2018-04-23 ENCOUNTER — Ambulatory Visit: Payer: Medicaid Other | Admitting: Internal Medicine

## 2018-04-28 ENCOUNTER — Other Ambulatory Visit: Payer: Self-pay | Admitting: Family Medicine

## 2018-04-28 DIAGNOSIS — M5010 Cervical disc disorder with radiculopathy, unspecified cervical region: Secondary | ICD-10-CM

## 2018-04-30 ENCOUNTER — Telehealth: Payer: Self-pay | Admitting: Family Medicine

## 2018-04-30 NOTE — Telephone Encounter (Signed)
On the form, there is a question about medical clearance to return to work. I have not evaluated her recently, hence difficult to answer.  I will need to see her soon.

## 2018-04-30 NOTE — Telephone Encounter (Signed)
HIPPA call back message left.    Note: PT was not approved. I will complete form to the best of my ability and have her note faxed with the vocational rehabilitation form.  I will discuss with her when she calls back.

## 2018-04-30 NOTE — Telephone Encounter (Signed)
Scheduled with pcp for 5/28.

## 2018-05-06 ENCOUNTER — Other Ambulatory Visit (INDEPENDENT_AMBULATORY_CARE_PROVIDER_SITE_OTHER): Payer: Self-pay | Admitting: Family

## 2018-05-08 ENCOUNTER — Telehealth: Payer: Self-pay | Admitting: Family Medicine

## 2018-05-08 ENCOUNTER — Other Ambulatory Visit: Payer: Self-pay

## 2018-05-08 ENCOUNTER — Ambulatory Visit (INDEPENDENT_AMBULATORY_CARE_PROVIDER_SITE_OTHER): Payer: Medicaid Other | Admitting: Family Medicine

## 2018-05-08 ENCOUNTER — Encounter: Payer: Self-pay | Admitting: Family Medicine

## 2018-05-08 VITALS — BP 124/86 | HR 78 | Temp 98.7°F | Ht 67.0 in | Wt 200.0 lb

## 2018-05-08 DIAGNOSIS — Z0271 Encounter for disability determination: Secondary | ICD-10-CM

## 2018-05-08 DIAGNOSIS — M5126 Other intervertebral disc displacement, lumbar region: Secondary | ICD-10-CM

## 2018-05-08 DIAGNOSIS — M545 Low back pain: Secondary | ICD-10-CM

## 2018-05-08 DIAGNOSIS — Z1231 Encounter for screening mammogram for malignant neoplasm of breast: Secondary | ICD-10-CM | POA: Diagnosis not present

## 2018-05-08 DIAGNOSIS — Z23 Encounter for immunization: Secondary | ICD-10-CM | POA: Diagnosis not present

## 2018-05-08 DIAGNOSIS — F418 Other specified anxiety disorders: Secondary | ICD-10-CM | POA: Diagnosis not present

## 2018-05-08 DIAGNOSIS — Z1239 Encounter for other screening for malignant neoplasm of breast: Secondary | ICD-10-CM

## 2018-05-08 NOTE — Patient Instructions (Signed)

## 2018-05-08 NOTE — Progress Notes (Signed)
Subjective:     Patient ID: Lisa Newton, female   DOB: 1971/10/02, 47 y.o.   MRN: [id number]  HPI Back pain:S/P back surgery in Jan.Back pain remains unchanged but stable with her pain meds. She now also have B/L thigh pain seems to be radiating from her back, no recent fall or injury.She is established with pain management now and her first visit was 1 week ago.They increased Lyrica to 300 mg daily. Disability: Patient has been having difficulty with keeping a job due to her chronic pain s/p multiple surgeries. She also applied for avocational rehabilitation service which required some forms to be completed. Depression:Feels depressed due to pain, she stated she feels if her pain is gone, she will feel better. She is currently not on medication and she does not feel the need to be on it. HM: Need update of vaccination.  Current Outpatient Medications on File Prior to Visit  Medication Sig Dispense Refill  . acetaminophen (TYLENOL) 325 MG tablet Take 650 mg by mouth every 6 (six) hours as needed for moderate pain or headache.    [name] atorvastatin (LIPITOR) 20 MG tablet Take 1 tablet (20 mg total) by mouth daily. (Patient not taking: Reported on 11/29/2017) 90 tablet 3  . clindamycin-benzoyl peroxide (BENZACLIN) gel Apply topically 2 (two) times daily. (Patient not taking: Reported on 03/16/2018) 25 g 0  . cyclobenzaprine (FLEXERIL) 10 MG tablet Take 1 tablet (10 mg total) by mouth 2 (two) times daily as needed for muscle spasms. 20 tablet 0  . cyclobenzaprine (FLEXERIL) 5 MG tablet Take 1 tablet (5 mg total) by mouth at bedtime. (Patient not taking: Reported on 04/01/2018) 60 tablet 2  . hydrochlorothiazide (MICROZIDE) 12.5 MG capsule TAKE 1 CAPSULE (12.5 MG TOTAL) BY MOUTH DAILY. HOLD IF BP IS CONSISTENTLY LESS THAN 130/80 30 capsule 1  . hydrOXYzine (VISTARIL) 25 MG capsule Take 1 capsule (25 mg total) by mouth every 8 (eight) hours as needed for anxiety (Insomnia). 1 PO 1 hour prior to procedure  (Patient not taking: Reported on 03/16/2018) 30 capsule 0  . ibuprofen (ADVIL,MOTRIN) 800 MG tablet Take 800 mg by mouth every 6 (six) hours as needed for moderate pain.    [name] lidocaine (LIDODERM) 5 % PLACE 1 PATCH ONTO THE SKIN DAILY. REMOVE & DISCARD PATCH WITHIN 12 HOURS OR AS DIRECTED BY MD 30 patch 1  . meloxicam (MOBIC) 15 MG tablet Take 1 tablet (15 mg total) by mouth daily. Daily 4-5 days, then use as needed. (Patient taking differently: Take 15 mg by mouth daily as needed for pain. ) 30 tablet 0  . Menthol (ICY HOT BACK) 5 % PTCH Apply 1 patch topically daily as needed (BACK PAIN).    [name] oxyCODONE-acetaminophen (PERCOCET/ROXICET) 5-325 MG tablet Take 2 tablets by mouth every 8 (eight) hours as needed. 12 tablet 0   No current facility-administered medications on file prior to visit.    Past Medical History:  Diagnosis Date  . Arthritis   . Depression 01/11/2012  . DVT (deep venous thrombosis) (HCC) 04/07/2015  . Hypertension   . Left ear hearing loss   . Neck pain   . PONV (postoperative nausea and vomiting)    Vitals:   05/08/18 0830  BP: 124/86  Pulse: 78  Temp: 98.7 F (37.1 C)  TempSrc: Oral  SpO2: 98%  Weight: 200 lb (90.7 kg)  Height:  (1.702 m)      Review of Systems  Respiratory: Negative.   Cardiovascular: Negative.  Gastrointestinal: Negative.   Musculoskeletal: Positive for back pain.       Thigh pain B/L  Psychiatric/Behavioral:       Sad  All other systems reviewed and are negative.      Objective:   Physical Exam  Constitutional: She appears well-developed. No distress.  Cardiovascular: Normal rate, regular rhythm and normal heart sounds.  No murmur heard. Pulmonary/Chest: Effort normal and breath sounds normal. No stridor. No respiratory distress. She has no wheezes.  Abdominal: Soft. Bowel sounds are normal. She exhibits no distension and no mass. There is no tenderness.  Musculoskeletal:       Lumbar back: She exhibits decreased range of  motion and tenderness. She exhibits no swelling and no deformity.       Legs: Neurological: She is alert. She has normal strength and normal reflexes. No cranial nerve deficit or sensory deficit.  Psychiatric: Her speech is normal and behavior is normal. Judgment and thought content normal. Cognition and memory are normal. She exhibits a depressed mood. She expresses no homicidal and no suicidal ideation. She expresses no suicidal plans and no homicidal plans.  Teary during today's visit She is attentive.  Nursing note and vitals reviewed.   Depression screen PHQ 2/9 05/08/2018  Decreased Interest 1  Down, Depressed, Hopeless 2  PHQ - 2 Score 3  Altered sleeping 1  Tired, decreased energy 1  Change in appetite 0  Feeling bad or failure about yourself  0  Trouble concentrating 1  Moving slowly or fidgety/restless 2  Suicidal thoughts 0  PHQ-9 Score 8  Difficult doing work/chores Somewhat difficult  Some recent data might be hidden      Assessment:     Back pain Disability Depression Health maintenance    Plan:     Check problem list. Tdap given today. Mammogram slip given and I ordered mammogram as she is due for one.

## 2018-05-08 NOTE — Telephone Encounter (Signed)
This is a JN pt.  

## 2018-05-08 NOTE — Telephone Encounter (Signed)
Letter completed and was handed to Harriette to print out medical record and fax to her lawyer at her request.

## 2018-05-08 NOTE — Assessment & Plan Note (Signed)
Triggered by multiple health issues especially pain. Currently off meds. See PHQ9 done today. Patient does not want medication at this point. I offered evaluation by [facility] today for counseling but she declined since it had not helped a lot in the past. Return precaution discussed. See [place] soon if symptoms worsens.

## 2018-05-08 NOTE — Telephone Encounter (Signed)
meloxicam refill request 

## 2018-05-08 NOTE — Assessment & Plan Note (Signed)
S/P Surgery. I was unable to find her surgery report.  S/P PT evaluation. Continue pain management. Continue home back exercise.

## 2018-05-08 NOTE — Assessment & Plan Note (Signed)
As discussed with her, to complete her vocational rehab form, she need a PT functional capacity evaluation or referral to occupational therapy. For now she wants to hold on that till she completes her disability application. She will get a letter from her lawyer for ROI.   Note: I got ROI from her lawyer signed by her. We will send them copy of her chart record.  I also provided letter of support.

## 2018-05-08 NOTE — Telephone Encounter (Signed)
Pt came in office and dropped a document from a [employer]. Document was placed in MD's box.

## 2018-05-15 ENCOUNTER — Telehealth: Payer: Self-pay | Admitting: Family Medicine

## 2018-05-15 DIAGNOSIS — H9202 Otalgia, left ear: Secondary | ICD-10-CM

## 2018-05-15 NOTE — Telephone Encounter (Signed)
Patient mentioned at the end of her last visit in may that she is beginning to have left ear pain. I did examine her ear and it was benign.  She called back today stating that the pain is persistent and now associated with left side headache. I recommended that she [facility] who [facility] her [facility]. Referral placed, she agreed to call them and schedule f/u appointment. In the meantime, if this worsens she is advised to come in to see me or go to the ED.  All questions were answered.

## 2018-05-15 NOTE — Telephone Encounter (Signed)
Pt called and said she was recently seen by Dr Lum BabeEniola concerning her severe headaches and ear aches. She was wondering if she should make another appointment since it has not improved or if she could possibly get a referral for an ENT. She would like Dr Lum BabeEniola to contact her to discuss what she should do.

## 2018-05-22 ENCOUNTER — Other Ambulatory Visit: Payer: Self-pay | Admitting: Family Medicine

## 2018-05-22 ENCOUNTER — Ambulatory Visit: Payer: Medicaid Other

## 2018-05-22 MED ORDER — CYCLOBENZAPRINE HCL 5 MG PO TABS: 5.0000 mg | ORAL_TABLET | Freq: Two times a day (BID) | ORAL | 1 refills | Status: DC | PRN

## 2018-05-22 MED ORDER — CYCLOBENZAPRINE HCL 10 MG PO TABS: 10.0000 mg | ORAL_TABLET | Freq: Two times a day (BID) | ORAL | 0 refills | Status: DC | PRN

## 2018-05-22 NOTE — Telephone Encounter (Signed)
Will forward to MD to advise. Holly Pring,CMA  

## 2018-05-22 NOTE — Telephone Encounter (Signed)
Patient came to office wanting to know if RX Flexeril 10 mg can be refilled. Patient is having hard time sleeping at night due to back pain. Patient is totally out. Please let her know [phone]. [facility] [address]

## 2018-05-23 ENCOUNTER — Telehealth: Payer: Self-pay | Admitting: Family Medicine

## 2018-05-23 ENCOUNTER — Encounter (HOSPITAL_COMMUNITY): Payer: Self-pay | Admitting: Emergency Medicine

## 2018-05-23 ENCOUNTER — Emergency Department (HOSPITAL_COMMUNITY)
Admission: EM | Admit: 2018-05-23 | Discharge: 2018-05-23 | Disposition: A | Discharge status: Home / Self Care | Payer: Medicaid Other | Attending: Emergency Medicine | Admitting: Emergency Medicine

## 2018-05-23 ENCOUNTER — Telehealth: Payer: Self-pay | Admitting: *Deleted

## 2018-05-23 DIAGNOSIS — I10 Essential (primary) hypertension: Secondary | ICD-10-CM | POA: Diagnosis not present

## 2018-05-23 DIAGNOSIS — M542 Cervicalgia: Secondary | ICD-10-CM | POA: Diagnosis present

## 2018-05-23 DIAGNOSIS — Z79899 Other long term (current) drug therapy: Secondary | ICD-10-CM | POA: Diagnosis not present

## 2018-05-23 DIAGNOSIS — Z91040 Latex allergy status: Secondary | ICD-10-CM | POA: Diagnosis not present

## 2018-05-23 DIAGNOSIS — M7918 Myalgia, other site: Secondary | ICD-10-CM

## 2018-05-23 DIAGNOSIS — G894 Chronic pain syndrome: Secondary | ICD-10-CM | POA: Diagnosis not present

## 2018-05-23 MED ORDER — ORPHENADRINE CITRATE ER 100 MG PO TB12
100.0000 mg | ORAL_TABLET | Freq: Two times a day (BID) | ORAL | Status: AC
  Administered 2018-05-23: 100 mg via ORAL
  Filled 2018-05-23: qty 1

## 2018-05-23 MED ORDER — ORPHENADRINE CITRATE ER 100 MG PO TB12: 100.0000 mg | ORAL_TABLET | Freq: Two times a day (BID) | ORAL | 0 refills | Status: DC

## 2018-05-23 MED ORDER — KETOROLAC TROMETHAMINE 30 MG/ML IJ SOLN
30.0000 mg | Freq: Once | INTRAMUSCULAR | Status: AC
  Administered 2018-05-23: 30 mg via INTRAMUSCULAR
  Filled 2018-05-23: qty 1

## 2018-05-23 NOTE — Telephone Encounter (Signed)
Patient calls nurse line.  She wants to know if Dr. Lum BabeEniola can call her pain management provider and talk to them about her tramadol not working.    I explained that once we referred to pain management that we did not handle pain meds anymore.  Also that she would have to contact the pain center if her meds were not helping.   Pt expresses understanding but would like for Dr. Lum BabeEniola to call her to "discuss options". [place], Maryjo RochesterJessica Dawn, CMA
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
## 2018-05-23 NOTE — ED Triage Notes (Addendum)
Per [facility]: Patient to ED from home c/o L neck pain radiating to L shoulder x 3 days. Patient reports hx of chronic back pain, has had multiple back and neck surgeries, in which she sees pain management for. Patient called her PCP office today regarding her Tramadol not working. Patient adds that she also has chronic posterior thigh pain (bilateral), but has been worse for the last 4 days. Patient denies recent falls or injuries. Ambulatory in triage without difficulty, she denies numbness/tingling/weakness. EMS VS: 188/110, P 88, RR 20, 99% RA. Patient tearful in triage.   Patient states she was told to go to ED for further evaluation since her next pain appointment isn't until 06/12/18.

## 2018-05-23 NOTE — Discharge Instructions (Addendum)
Follow-up with her pain management specialist. Return to ER for worsening or concerning symptoms.

## 2018-05-23 NOTE — Telephone Encounter (Signed)
I called and spoke with the patient. She is in a lot of pain. She also endorsed intermittent shooting pain but no numbness. She used Tramadol without improvement. She is out of her Flexeril. She contacted the pain clinic before and was advised to continue Tramadol.   My recommendations: 1. Advised to go to the ED for an evaluation to r/o neurologic pathology. She declines at the moment but will consider if symptoms persist. 2. Contact her neurosurgeon's office for f/u appointments as soon as possible. She had a recent MRI of the spine, which I reviewed quickly while talking with her.  3. I recommended restarting Gabapentin for neuropathy pain in addition to her Tramadol. However, she declined at this time. 4. I mentioned to her that her Flexeril was refilled yesterday and should be at the pharmacy. She will go pick up meds. These could improve her symptoms. 5. I advised that she contacts her pain clinic for medication adjustment as soon as possible. She verbalized understanding and agreed with plans.

## 2018-05-23 NOTE — ED Notes (Signed)
Back pain and neck pain states was pressing on brakes hard  X 1 month stattes called her dr has appointment in July but cannot wait

## 2018-05-25 ENCOUNTER — Other Ambulatory Visit: Payer: Self-pay

## 2018-05-25 ENCOUNTER — Ambulatory Visit (INDEPENDENT_AMBULATORY_CARE_PROVIDER_SITE_OTHER): Payer: Medicaid Other | Admitting: Family Medicine

## 2018-05-25 ENCOUNTER — Encounter: Payer: Self-pay | Admitting: Family Medicine

## 2018-05-25 DIAGNOSIS — M5412 Radiculopathy, cervical region: Secondary | ICD-10-CM

## 2018-05-25 DIAGNOSIS — M5416 Radiculopathy, lumbar region: Secondary | ICD-10-CM | POA: Diagnosis not present

## 2018-05-25 NOTE — Progress Notes (Signed)
Subjective:     Patient ID: Lisa Newton, female   DOB: 1971-12-07, 47 y.o.   MRN: [id number]  HPI Cervical pain: C/O pain of the left side of her neck. This is the same pain she has had for years. Pain radiates to her arm. Pain is intermittent but had worsened in the last few months. She is compliant with her pain meds without improvement. She recently went to the ED where she was treated with muscle relaxant.  LL Pain: Also c/o LL pain which radiates from her back. Pain initially radiated to the right, but now it is bilateral. Pain is aggravating. She is compliant with her pain meds without improvement. Pain has worsened over the last month.  Current Outpatient Medications on File Prior to Visit  Medication Sig Dispense Refill  . hydrochlorothiazide (MICROZIDE) 12.5 MG capsule TAKE 1 CAPSULE (12.5 MG TOTAL) BY MOUTH DAILY. HOLD IF BP IS CONSISTENTLY LESS THAN 130/80 30 capsule 1  . ibuprofen (ADVIL,MOTRIN) 800 MG tablet Take 800 mg by mouth every 6 (six) hours as needed for moderate pain.    [name]. lidocaine (LIDODERM) 5 % PLACE 1 PATCH ONTO THE SKIN DAILY. REMOVE & DISCARD PATCH WITHIN 12 HOURS OR AS DIRECTED BY MD 30 patch 1  . LYRICA 150 MG capsule Take 150 mg by mouth 2 (two) times daily.  1  . meloxicam (MOBIC) 15 MG tablet Take 1 tablet (15 mg total) by mouth daily as needed for pain. 90 tablet 3  . orphenadrine (NORFLEX) 100 MG tablet Take 1 tablet (100 mg total) by mouth 2 (two) times daily for 7 days. 14 tablet 0  . traMADol (ULTRAM) 50 MG tablet Take 50 mg by mouth every 6 (six) hours as needed.  1   No current facility-administered medications on file prior to visit.    Past Medical History:  Diagnosis Date  . Arthritis   . Depression 01/11/2012  . DVT (deep venous thrombosis) (HCC) 04/07/2015  . Hypertension   . Left ear hearing loss   . Neck pain   . PONV (postoperative nausea and vomiting)    Vitals:   05/25/18 0921  BP: 124/90  Pulse: 84  Temp: 99.2 F (37.3 C)   TempSrc: Oral  SpO2: 99%  Weight: 206 lb (93.4 kg)  Height: 5\' 7"  (1.702 m)     Review of Systems  Respiratory: Negative.   Cardiovascular: Negative.   Gastrointestinal: Negative.   Musculoskeletal: Positive for arthralgias, back pain and neck pain. Negative for joint swelling and neck stiffness.  Neurological: Negative.   All other systems reviewed and are negative.      Objective:   Physical Exam  Constitutional: She appears well-developed. No distress.  Cardiovascular: Normal rate, regular rhythm and normal heart sounds.  No murmur heard. Pulmonary/Chest: Effort normal and breath sounds normal. No stridor. No respiratory distress. She has no wheezes.  Abdominal: Soft. Bowel sounds are normal. She exhibits no distension. There is no tenderness.  Musculoskeletal:       Right shoulder: Normal.       Left shoulder: Normal.       Right hip: Normal.       Left hip: She exhibits normal range of motion and no tenderness.       Cervical back: She exhibits tenderness. She exhibits normal range of motion, no swelling and no deformity.       Thoracic back: Normal.       Lumbar back: She exhibits tenderness. She exhibits normal range  of motion.  + straight leg raising test B/L  Nursing note and vitals reviewed.      Assessment:     Cervical radiculopathy Lumbar radiculopathy    Plan:     Check problem list.

## 2018-05-25 NOTE — Assessment & Plan Note (Signed)
Most recent MRI reviewed. No acute findings. As discussed with her, I will defer repeat imaging to her Neurosurg. F/U with pain specialist for pain management. I will send message to our referral specialist to see if they can help in any way to expedite or move back her follow-up appointment, she she is not to return to them till July.

## 2018-05-25 NOTE — Assessment & Plan Note (Signed)
No acute findings. As discussed with her, I will defer repeat imaging to her Neurosurg. F/U with pain specialist for pain management. I will send message to our referral specialist to see if they can help in any way to expedite or move back her follow-up appointment, she she is not to return to them till July.  Home back exercise recommended. F/U soon with specialist as planned.

## 2018-05-25 NOTE — Patient Instructions (Signed)
Lumbosacral Radiculopathy Lumbosacral radiculopathy is a condition that involves the spinal nerves and nerve roots in the low back and bottom of the spine. The condition develops when these nerves and nerve roots move out of place or become inflamed and cause symptoms. What are the causes? This condition may be caused by:  Pressure from a disk that bulges out of place (herniated disk). A disk is a plate of cartilage that separates bones in the spine.  Disk degeneration.  A narrowing of the bones of the lower back (spinal stenosis).  A tumor.  An infection.  An injury that places sudden pressure on the disks that cushion the bones of your lower spine.  What increases the risk? This condition is more likely to develop in:  Males aged 30-50 years.  Females aged 50-60 years.  People who lift improperly.  People who are overweight or live a sedentary lifestyle.  People who smoke.  People who perform repetitive activities that strain the spine.  What are the signs or symptoms? Symptoms of this condition include:  Pain that goes down from the back into the legs (sciatica). This is the most common symptom. The pain may be worse with sitting, coughing, or sneezing.  Pain and numbness in the arms and legs.  Muscle weakness.  Tingling.  Loss of bladder control or bowel control.  How is this diagnosed? This condition is diagnosed with a physical exam and medical history. If the pain is lasting, you may have tests, such as:  MRI scan.  X-ray.  CT scan.  Myelogram.  Nerve conduction study.  How is this treated? This condition is often treated with:  Hot packs and ice applied to affected areas.  Stretches to improve flexibility.  Exercises to strengthen back muscles.  Physical therapy.  Pain medicine.  A steroid injection in the spine.  In some cases, no treatment is needed. If the condition is long-lasting (chronic), or if symptoms are severe, treatment may  involve surgery or lifestyle changes, such as following a weight loss plan. Follow these instructions at home: Medicines  Take medicines only as directed by your health care provider.  Do not drive or operate heavy machinery while taking pain medicine. Injury care  Apply a heat pack to the injured area as directed by your health care provider.  Apply ice to the affected area: ? Put ice in a plastic bag. ? Place a towel between your skin and the bag. ? Leave the ice on for 20-30 minutes, every 2 hours while you are awake or as needed. Or, leave the ice on for as long as directed by your health care provider. Other Instructions  If you were shown how to do any exercises or stretches, do them as directed by your health care provider.  If your health care provider prescribed a diet or exercise program, follow it as directed.  Keep all follow-up visits as directed by your health care provider. This is important. Contact a health care provider if:  Your pain does not improve over time even when taking pain medicines. Get help right away if:  Your develop severe pain.  Your pain suddenly gets worse.  You develop increasing weakness in your legs.  You lose the ability to control your bladder or bowel.  You have difficulty walking or balancing.  You have a fever. This information is not intended to replace advice given to you by your health care provider. Make sure you discuss any questions you have with your   health care provider. Document Released: 11/28/2005 Document Revised: 05/05/2016 Document Reviewed: 11/24/2014 Elsevier Interactive Patient Education  2018 [employer]vier Inc.  

## 2018-05-28 ENCOUNTER — Encounter: Payer: Self-pay | Admitting: Physical Therapy

## 2018-05-28 NOTE — Therapy (Signed)
[facility] [place], [place], [address] Phone: [phone]   Fax:  [phone]  Patient Details  Name: Gaetana Kawahara MRN: [id number] Date of Birth: 06-Jul-1971 Referring Provider:  No ref. provider found  Encounter Date: 05/28/2018  PHYSICAL THERAPY DISCHARGE SUMMARY  Visits from Start of Care: 3  Current functional level related to goals / functional outcomes: Patient has not returned since last session- DC   Remaining deficits: Unable to assess    Education / Equipment:  N/A  Plan: Patient agrees to discharge.  Patient goals were not met. Patient is being discharged due to not returning since the last visit.  ?????      Deniece Ree PT, DPT, CBIS  Supplemental Physical Therapist [place]   Pager [facility] [address] [place], [place], [address] Phone: [phone]   Fax:  [phone]

## 2018-05-29 ENCOUNTER — Telehealth: Payer: Self-pay

## 2018-05-29 NOTE — Telephone Encounter (Signed)
Patient left message that at her last office visit her Meloxicam dosage was supposed to be increased from 15 mg.  Call back is [phone]   Ples SpecterAlisa Kiasia Chou, RN [facility] RN)

## 2018-05-29 NOTE — Telephone Encounter (Signed)
I called and discussed with the patient. She is already on Mobic 15 mg daily which is the maximum. She asked if we can help with getting to see pain specialist soon since her f/u appointment was scheduled for June. As discussed with her, I will check with our referral specialist.  Cathleen CortiHey Jackie, can you help patient with this by any chance?

## 2018-06-05 ENCOUNTER — Telehealth: Payer: Self-pay | Admitting: *Deleted

## 2018-06-05 NOTE — Telephone Encounter (Signed)
Pt states that she is still in a lot pain "is suffering" and pain clinic has not reached back.  She states that Dr. Lum BabeEniola was going to speak with "her supervisor" to get some more ideas???  Will forward to Dr. Lum BabeEniola.  Pt can be reached @ [phone]  Doniel Maiello, Maryjo RochesterJessica Dawn, CMA

## 2018-06-05 NOTE — Telephone Encounter (Signed)
Please see telephone discussion from 6/18. I message Annice PihJackie as discussed with her. There is little we can do if pain clinic will not get her in soon. There is no guarantee that we can help with referral to another pain specialist. Advise her to call them and follow-up with their recs.    Annice PihJackie did you hear back from her pain clinic yet?   I called and discussed with the patient. She is already on Mobic 15 mg daily which is the maximum. She asked if we can help with getting to see pain specialist soon since her f/u appointment was scheduled for June. As discussed with her, I will check with our referral specialist.  Cathleen CortiHey Jackie, can you help patient with this by any chance? I called and discussed with the patient. She is already on Mobic 15 mg daily which is the maximum. She asked if we can help with getting to see pain specialist soon since her f/u appointment was scheduled for June. As discussed with her, I will check with our referral specialist.  Cathleen CortiHey Jackie, can you help patient with this by any chance?

## 2018-06-06 ENCOUNTER — Other Ambulatory Visit: Payer: Self-pay

## 2018-06-06 ENCOUNTER — Ambulatory Visit
Admission: RE | Admit: 2018-06-06 | Discharge: 2018-06-06 | Disposition: A | Discharge status: Home / Self Care | Payer: Medicaid Other | Source: Ambulatory Visit | Attending: Family Medicine | Admitting: Family Medicine

## 2018-06-06 ENCOUNTER — Emergency Department (HOSPITAL_COMMUNITY)
Admission: EM | Admit: 2018-06-06 | Discharge: 2018-06-06 | Disposition: A | Discharge status: Home / Self Care | Payer: Medicaid Other | Attending: Emergency Medicine | Admitting: Emergency Medicine

## 2018-06-06 ENCOUNTER — Encounter (HOSPITAL_COMMUNITY): Payer: Self-pay

## 2018-06-06 DIAGNOSIS — M542 Cervicalgia: Secondary | ICD-10-CM | POA: Diagnosis not present

## 2018-06-06 DIAGNOSIS — Z79899 Other long term (current) drug therapy: Secondary | ICD-10-CM | POA: Diagnosis not present

## 2018-06-06 DIAGNOSIS — M549 Dorsalgia, unspecified: Secondary | ICD-10-CM | POA: Diagnosis not present

## 2018-06-06 DIAGNOSIS — I10 Essential (primary) hypertension: Secondary | ICD-10-CM | POA: Insufficient documentation

## 2018-06-06 DIAGNOSIS — Z1239 Encounter for other screening for malignant neoplasm of breast: Secondary | ICD-10-CM

## 2018-06-06 DIAGNOSIS — Z91040 Latex allergy status: Secondary | ICD-10-CM | POA: Insufficient documentation

## 2018-06-06 DIAGNOSIS — F329 Major depressive disorder, single episode, unspecified: Secondary | ICD-10-CM | POA: Diagnosis not present

## 2018-06-06 DIAGNOSIS — G8929 Other chronic pain: Secondary | ICD-10-CM | POA: Diagnosis not present

## 2018-06-06 DIAGNOSIS — F1410 Cocaine abuse, uncomplicated: Secondary | ICD-10-CM | POA: Insufficient documentation

## 2018-06-06 MED ORDER — PREDNISONE 10 MG (21) PO TBPK: ORAL_TABLET | Freq: Every day | ORAL | 0 refills | Status: DC

## 2018-06-06 MED ORDER — KETOROLAC TROMETHAMINE 15 MG/ML IJ SOLN
15.0000 mg | Freq: Once | INTRAMUSCULAR | Status: AC
  Administered 2018-06-06: 15 mg via INTRAMUSCULAR
  Filled 2018-06-06: qty 1

## 2018-06-06 NOTE — ED Triage Notes (Signed)
Patient c/o left lateral neck pain and bilateral lower back pain x 1 month. Patient c/o pain radiating down both legs. Patient tearful in triage.Patient denies any recent injuries or falls. Patient states, "I have been walking as usual." Patient states, Tramadol does not work. I have been calling the pain management clinic and they made an appointment for next Tuesday.

## 2018-06-06 NOTE — Discharge Instructions (Addendum)
Continue taking Your normal medications at home for your symptoms.  You were also given a steroid taper to help with your symptoms.  Please take as instructed.  Follow-up with your neurosurgeon as scheduled next week and return to the ER for any new or worsening symptoms.
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
## 2018-06-07 ENCOUNTER — Telehealth: Payer: Self-pay | Admitting: *Deleted

## 2018-06-07 NOTE — Telephone Encounter (Signed)
Patient aware of message from MD. Burnard HawthorneJazmin Afnan Cadiente,CMA

## 2018-06-07 NOTE — Telephone Encounter (Signed)
-----   Message from Doreene ElandKehinde T Eniola, MD sent at 06/06/2018  1:05 PM EDT ----- Please and let patient know:   I message Annice PihJackie as discussed with her. There is little we can do if pain clinic will not get her in soon. There is no guarantee that we can help with referral to another pain specialist. Advise her to call her pain clinic and follow-up with their recs.  Thanks.

## 2018-06-11 DIAGNOSIS — M2669 Other specified disorders of temporomandibular joint: Secondary | ICD-10-CM | POA: Insufficient documentation

## 2018-06-11 DIAGNOSIS — K1121 Acute sialoadenitis: Secondary | ICD-10-CM | POA: Insufficient documentation

## 2018-06-11 DIAGNOSIS — G8929 Other chronic pain: Secondary | ICD-10-CM | POA: Insufficient documentation

## 2018-06-11 DIAGNOSIS — H9202 Otalgia, left ear: Secondary | ICD-10-CM | POA: Insufficient documentation

## 2018-06-11 HISTORY — DX: Acute sialoadenitis: K11.21

## 2018-06-11 HISTORY — DX: Other specified disorders of temporomandibular joint: M26.69

## 2018-06-12 ENCOUNTER — Encounter: Payer: Self-pay | Admitting: Family Medicine

## 2018-06-12 DIAGNOSIS — M5416 Radiculopathy, lumbar region: Secondary | ICD-10-CM

## 2018-06-12 NOTE — Progress Notes (Signed)
Came in with daughter for her visit. She stated she was seen at the pain clinic and PT was ordered. She requested aerobic water exercise. I will refer to PT for water aerobics.

## 2018-06-15 ENCOUNTER — Telehealth: Payer: Self-pay | Admitting: *Deleted

## 2018-06-15 ENCOUNTER — Other Ambulatory Visit: Payer: Self-pay | Admitting: Family Medicine

## 2018-06-15 NOTE — Telephone Encounter (Signed)
Please advise patient, she should have refill at the pharmacy.

## 2018-06-15 NOTE — Telephone Encounter (Signed)
I was placed PT referral for water aerobic. They should contact her soon to schedule f/u.

## 2018-06-15 NOTE — Telephone Encounter (Signed)
Pt contacted and informed her she should have a refill left on her medication. Pt stated she needs to be on the 10mg  now? Pt also stated that the referral for water aerobics is something PTs do not do, at least the place she was referred to. Please advise.

## 2018-06-15 NOTE — Telephone Encounter (Signed)
She can take two of the 5 mg till she is out. I will send in 10 mg Mid July. [facility] Advise her to contact them for instructions.

## 2018-06-15 NOTE — Telephone Encounter (Signed)
Pt is calling because she was under the impression that Dr. Lum BabeEniola was going to give her some suggestions about water physical therapy. Fleeger, Maryjo RochesterJessica Dawn, CMA

## 2018-06-19 NOTE — Telephone Encounter (Signed)
Spoke with pt and informed her to take (2) 5mg  tablets and will refill for 10mg  mid July. Informed pt the [facility] would be a good resource, pt stated she cant afford that. Pt is to call them to see what type of programs they may have or if a doctors note would be sufficient for ger take water aerobics classes. Pt will call back with details.

## 2018-06-27 ENCOUNTER — Telehealth: Payer: Self-pay

## 2018-06-27 ENCOUNTER — Other Ambulatory Visit: Payer: Self-pay | Admitting: Family Medicine

## 2018-06-27 MED ORDER — CYCLOBENZAPRINE HCL 10 MG PO TABS: 10.0000 mg | ORAL_TABLET | Freq: Three times a day (TID) | ORAL | 1 refills | Status: DC | PRN

## 2018-06-27 NOTE — Telephone Encounter (Signed)
Pt called nurse line requesting a refill on her flexeril. Pt stated this is suppose to be for the 10mg  tablets. Please advise.

## 2018-06-27 NOTE — Telephone Encounter (Signed)
Refilled

## 2018-07-04 ENCOUNTER — Telehealth: Payer: Self-pay | Admitting: *Deleted

## 2018-07-04 ENCOUNTER — Other Ambulatory Visit: Payer: Self-pay | Admitting: Family Medicine

## 2018-07-04 DIAGNOSIS — M5010 Cervical disc disorder with radiculopathy, unspecified cervical region: Secondary | ICD-10-CM

## 2018-07-04 DIAGNOSIS — M5416 Radiculopathy, lumbar region: Secondary | ICD-10-CM

## 2018-07-04 NOTE — Telephone Encounter (Signed)
Pt informed. Chyler Creely Dawn, CMA  

## 2018-07-04 NOTE — Telephone Encounter (Signed)
Pt has decided that she would like to go to [facility] again.  She called them but her last referral has expired.  She is requesting a new referral.   She wants me to let Dr. Lum BabeEniola know that is still in pain and the tramadol is not helping. Fleeger, Maryjo RochesterJessica Dawn, CMA

## 2018-07-04 NOTE — Progress Notes (Signed)
Lisa Newton, Lisa Newton, CMA 49 minutes ago (2:31 PM)      Pt has decided that she would like to go to [facility] again.  She called them but her last referral has expired.  She is requesting a new referral.   She wants me to let Dr. Lum BabeEniola know that is still in pain and the tramadol is not helping. Lisa Newton, Lisa Newton, CMA       Documentation

## 2018-07-04 NOTE — Telephone Encounter (Signed)
Please let her know the pain clinic referral order has been placed.

## 2018-07-17 ENCOUNTER — Other Ambulatory Visit: Payer: Self-pay

## 2018-07-17 ENCOUNTER — Telehealth: Payer: Self-pay | Admitting: Family Medicine

## 2018-07-17 ENCOUNTER — Other Ambulatory Visit: Payer: Self-pay | Admitting: Neurosurgery

## 2018-07-17 DIAGNOSIS — M5126 Other intervertebral disc displacement, lumbar region: Secondary | ICD-10-CM

## 2018-07-17 DIAGNOSIS — M4722 Other spondylosis with radiculopathy, cervical region: Secondary | ICD-10-CM

## 2018-07-17 NOTE — Telephone Encounter (Signed)
She stated that her neurologist wanted her to get another MRI of her spine and does not know if she will like to proceed with it; I recommended that this is a conversation she should have with her neurosurg if she wants to proceed with the MRI or not.  She asked if the neurologist does not have her previous MRI result. Unfortunately, there is no way I can know, I again directed her to her neurologist to discuss.   She has pain clinic tomorrow.  She will come in to get a copy of her MRI report for her neurologist. Please print out for her when she comes in. Thanks.

## 2018-07-17 NOTE — Telephone Encounter (Signed)
Pt would like Dr. Lum BabeEniola to call her as soon as possible. She said it is very important.

## 2018-07-18 ENCOUNTER — Ambulatory Visit
Admission: RE | Admit: 2018-07-18 | Discharge: 2018-07-18 | Disposition: A | Discharge status: Home / Self Care | Payer: Medicaid Other | Source: Ambulatory Visit | Attending: Nurse Practitioner | Admitting: Nurse Practitioner

## 2018-07-18 ENCOUNTER — Ambulatory Visit: Payer: Medicaid Other | Attending: Nurse Practitioner | Admitting: Nurse Practitioner

## 2018-07-18 ENCOUNTER — Ambulatory Visit
Admission: RE | Admit: 2018-07-18 | Discharge: 2018-07-18 | Disposition: A | Discharge status: Home / Self Care | Payer: Medicaid Other | Source: Ambulatory Visit | Attending: Pain Medicine | Admitting: Pain Medicine

## 2018-07-18 ENCOUNTER — Encounter: Payer: Self-pay | Admitting: Nurse Practitioner

## 2018-07-18 ENCOUNTER — Other Ambulatory Visit: Payer: Self-pay

## 2018-07-18 VITALS — BP 157/101 | HR 86 | Temp 98.2°F | Ht 66.0 in | Wt 206.0 lb

## 2018-07-18 DIAGNOSIS — Z79891 Long term (current) use of opiate analgesic: Secondary | ICD-10-CM

## 2018-07-18 DIAGNOSIS — M5442 Lumbago with sciatica, left side: Secondary | ICD-10-CM

## 2018-07-18 DIAGNOSIS — M533 Sacrococcygeal disorders, not elsewhere classified: Principal | ICD-10-CM

## 2018-07-18 DIAGNOSIS — M25569 Pain in unspecified knee: Secondary | ICD-10-CM | POA: Insufficient documentation

## 2018-07-18 DIAGNOSIS — F1411 Cocaine abuse, in remission: Secondary | ICD-10-CM | POA: Diagnosis not present

## 2018-07-18 DIAGNOSIS — E782 Mixed hyperlipidemia: Secondary | ICD-10-CM | POA: Diagnosis not present

## 2018-07-18 DIAGNOSIS — M5412 Radiculopathy, cervical region: Secondary | ICD-10-CM | POA: Insufficient documentation

## 2018-07-18 DIAGNOSIS — I10 Essential (primary) hypertension: Secondary | ICD-10-CM | POA: Insufficient documentation

## 2018-07-18 DIAGNOSIS — Z880 Allergy status to penicillin: Secondary | ICD-10-CM | POA: Insufficient documentation

## 2018-07-18 DIAGNOSIS — M899 Disorder of bone, unspecified: Secondary | ICD-10-CM

## 2018-07-18 DIAGNOSIS — Z882 Allergy status to sulfonamides status: Secondary | ICD-10-CM | POA: Insufficient documentation

## 2018-07-18 DIAGNOSIS — Z981 Arthrodesis status: Secondary | ICD-10-CM | POA: Insufficient documentation

## 2018-07-18 DIAGNOSIS — H9192 Unspecified hearing loss, left ear: Secondary | ICD-10-CM | POA: Diagnosis not present

## 2018-07-18 DIAGNOSIS — M26609 Unspecified temporomandibular joint disorder, unspecified side: Secondary | ICD-10-CM | POA: Insufficient documentation

## 2018-07-18 DIAGNOSIS — G894 Chronic pain syndrome: Secondary | ICD-10-CM | POA: Diagnosis not present

## 2018-07-18 DIAGNOSIS — M79606 Pain in leg, unspecified: Secondary | ICD-10-CM | POA: Diagnosis present

## 2018-07-18 DIAGNOSIS — M5416 Radiculopathy, lumbar region: Secondary | ICD-10-CM | POA: Insufficient documentation

## 2018-07-18 DIAGNOSIS — Z91419 Personal history of unspecified adult abuse: Secondary | ICD-10-CM | POA: Diagnosis not present

## 2018-07-18 DIAGNOSIS — Z791 Long term (current) use of non-steroidal anti-inflammatories (NSAID): Secondary | ICD-10-CM | POA: Insufficient documentation

## 2018-07-18 DIAGNOSIS — F418 Other specified anxiety disorders: Secondary | ICD-10-CM | POA: Insufficient documentation

## 2018-07-18 DIAGNOSIS — Z833 Family history of diabetes mellitus: Secondary | ICD-10-CM | POA: Insufficient documentation

## 2018-07-18 DIAGNOSIS — Z8739 Personal history of other diseases of the musculoskeletal system and connective tissue: Secondary | ICD-10-CM | POA: Insufficient documentation

## 2018-07-18 DIAGNOSIS — Z885 Allergy status to narcotic agent status: Secondary | ICD-10-CM | POA: Insufficient documentation

## 2018-07-18 DIAGNOSIS — G8929 Other chronic pain: Secondary | ICD-10-CM

## 2018-07-18 DIAGNOSIS — M79604 Pain in right leg: Secondary | ICD-10-CM | POA: Diagnosis not present

## 2018-07-18 DIAGNOSIS — Z91040 Latex allergy status: Secondary | ICD-10-CM | POA: Insufficient documentation

## 2018-07-18 DIAGNOSIS — M542 Cervicalgia: Secondary | ICD-10-CM | POA: Diagnosis not present

## 2018-07-18 DIAGNOSIS — H9202 Otalgia, left ear: Secondary | ICD-10-CM | POA: Insufficient documentation

## 2018-07-18 DIAGNOSIS — M5441 Lumbago with sciatica, right side: Secondary | ICD-10-CM | POA: Diagnosis not present

## 2018-07-18 DIAGNOSIS — Z79899 Other long term (current) drug therapy: Secondary | ICD-10-CM

## 2018-07-18 DIAGNOSIS — Z86718 Personal history of other venous thrombosis and embolism: Secondary | ICD-10-CM | POA: Insufficient documentation

## 2018-07-18 DIAGNOSIS — M79605 Pain in left leg: Secondary | ICD-10-CM | POA: Diagnosis not present

## 2018-07-18 DIAGNOSIS — Z8249 Family history of ischemic heart disease and other diseases of the circulatory system: Secondary | ICD-10-CM | POA: Insufficient documentation

## 2018-07-18 DIAGNOSIS — Z803 Family history of malignant neoplasm of breast: Secondary | ICD-10-CM | POA: Insufficient documentation

## 2018-07-18 DIAGNOSIS — Z789 Other specified health status: Secondary | ICD-10-CM

## 2018-07-18 HISTORY — DX: Disorder of bone, unspecified: M89.9

## 2018-07-18 NOTE — Patient Instructions (Addendum)
____________________________________________________________________________________________  Appointment Policy Summary  It is our goal and responsibility to provide the medical community with assistance in the evaluation and management of patients with chronic pain. Unfortunately our resources are limited. Because we do not have an unlimited amount of time, or available appointments, we are required to closely monitor and manage their use. The following rules exist to maximize their use:  Patient's responsibilities: 1. Punctuality:  At what time should I arrive? You should be physically present in our office 30 minutes before your scheduled appointment. Your scheduled appointment is with your assigned healthcare provider. However, it takes 5-10 minutes to be "checked-in", and another 15 minutes for the nurses to do the admission. If you arrive to our office at the time you were given for your appointment, you will end up being at least 20-25 minutes late to your appointment with the provider. 2. Tardiness:  What happens if I arrive only a few minutes after my scheduled appointment time? You will need to reschedule your appointment. The cutoff is your appointment time. This is why it is so important that you arrive at least 30 minutes before that appointment. If you have an appointment scheduled for 10:00 AM and you arrive at 10:01, you will be required to reschedule your appointment.  3. Plan ahead:  Always assume that you will encounter traffic on your way in. Plan for it. If you are dependent on a driver, make sure they understand these rules and the need to arrive early. 4. Other appointments and responsibilities:  Avoid scheduling any other appointments before or after your pain clinic appointments.  5. Be prepared:  Write down everything that you need to discuss with your healthcare provider and give this information to the admitting nurse. Write down the medications that you will need  refilled. Bring your pills and bottles (even the empty ones), to all of your appointments, except for those where a procedure is scheduled. 6. No children or pets:  Find someone to take care of them. It is not appropriate to bring them in. 7. Scheduling changes:  We request "advanced notification" of any changes or cancellations. 8. Advanced notification:  Defined as a time period of more than 24 hours prior to the originally scheduled appointment. This allows for the appointment to be offered to other patients. 9. Rescheduling:  When a visit is rescheduled, it will require the cancellation of the original appointment. For this reason they both fall within the category of "Cancellations".  10. Cancellations:  They require advanced notification. Any cancellation less than 24 hours before the  appointment will be recorded as a "No Show". 11. No Show:  Defined as an unkept appointment where the patient failed to notify or declare to the practice their intention or inability to keep the appointment.  Corrective process for repeat offenders:  1. Tardiness: Three (3) episodes of rescheduling due to late arrivals will be recorded as one (1) "No Show". 2. Cancellation or reschedule: Three (3) cancellations or rescheduling will be recorded as one (1) "No Show". 3. "No Shows": Three (3) "No Shows" within a 12 month period will result in discharge from the practice. ____________________________________________________________________________________________  ____________________________________________________________________________________________  Pain Scale  Introduction: The pain score used by this practice is the Verbal Numerical Rating Scale (VNRS-11). This is an 11-point scale. It is for adults and children 10 years or older. There are significant differences in how the pain score is reported, used, and applied. Forget everything you learned in the past and learn  this scoring system.  General  Information: The scale should reflect your current level of pain. Unless you are specifically asked for the level of your worst pain, or your average pain. If you are asked for one of these two, then it should be understood that it is over the past 24 hours.  Basic Activities of Daily Living (ADL): Personal hygiene, dressing, eating, transferring, and using restroom.  Instructions: Most patients tend to report their level of pain as a combination of two factors, their physical pain and their psychosocial pain. This last one is also known as "suffering" and it is reflection of how physical pain affects you socially and psychologically. From now on, report them separately. From this point on, when asked to report your pain level, report only your physical pain. Use the following table for reference.  Pain Clinic Pain Levels (0-5/10)  Pain Level Score  Description  No Pain 0   Mild pain 1 Nagging, annoying, but does not interfere with basic activities of daily living (ADL). Patients are able to eat, bathe, get dressed, toileting (being able to get on and off the toilet and perform personal hygiene functions), transfer (move in and out of bed or a chair without assistance), and maintain continence (able to control bladder and bowel functions). Blood pressure and heart rate are unaffected. A normal heart rate for a healthy adult ranges from 60 to 100 bpm (beats per minute).   Mild to moderate pain 2 Noticeable and distracting. Impossible to hide from other people. More frequent flare-ups. Still possible to adapt and function close to normal. It can be very annoying and may have occasional stronger flare-ups. With discipline, patients may get used to it and adapt.   Moderate pain 3 Interferes significantly with activities of daily living (ADL). It becomes difficult to feed, bathe, get dressed, get on and off the toilet or to perform personal hygiene functions. Difficult to get in and out of bed or a chair  without assistance. Very distracting. With effort, it can be ignored when deeply involved in activities.   Moderately severe pain 4 Impossible to ignore for more than a few minutes. With effort, patients may still be able to manage work or participate in some social activities. Very difficult to concentrate. Signs of autonomic nervous system discharge are evident: dilated pupils (mydriasis); mild sweating (diaphoresis); sleep interference. Heart rate becomes elevated (>115 bpm). Diastolic blood pressure (lower number) rises above 100 mmHg. Patients find relief in laying down and not moving.   Severe pain 5 Intense and extremely unpleasant. Associated with frowning face and frequent crying. Pain overwhelms the senses.  Ability to do any activity or maintain social relationships becomes significantly limited. Conversation becomes difficult. Pacing back and forth is common, as getting into a comfortable position is nearly impossible. Pain wakes you up from deep sleep. Physical signs will be obvious: pupillary dilation; increased sweating; goosebumps; brisk reflexes; cold, clammy hands and feet; nausea, vomiting or dry heaves; loss of appetite; significant sleep disturbance with inability to fall asleep or to remain asleep. When persistent, significant weight loss is observed due to the complete loss of appetite and sleep deprivation.  Blood pressure and heart rate becomes significantly elevated. Caution: If elevated blood pressure triggers a pounding headache associated with blurred vision, then the patient should immediately seek attention at an urgent or emergency care unit, as these may be signs of an impending stroke.    Emergency Department Pain Levels (6-10/10)  Emergency Room Pain 6 Severely   limiting. Requires emergency care and should not be seen or managed at an outpatient pain management facility. Communication becomes difficult and requires great effort. Assistance to reach the emergency department  may be required. Facial flushing and profuse sweating along with potentially dangerous increases in heart rate and blood pressure will be evident.   Distressing pain 7 Self-care is very difficult. Assistance is required to transport, or use restroom. Assistance to reach the emergency department will be required. Tasks requiring coordination, such as bathing and getting dressed become very difficult.   Disabling pain 8 Self-care is no longer possible. At this level, pain is disabling. The individual is unable to do even the most "basic" activities such as walking, eating, bathing, dressing, transferring to a bed, or toileting. Fine motor skills are lost. It is difficult to think clearly.   Incapacitating pain 9 Pain becomes incapacitating. Thought processing is no longer possible. Difficult to remember your own name. Control of movement and coordination are lost.   The worst pain imaginable 10 At this level, most patients pass out from pain. When this level is reached, collapse of the autonomic nervous system occurs, leading to a sudden drop in blood pressure and heart rate. This in turn results in a temporary and dramatic drop in blood flow to the brain, leading to a loss of consciousness. Fainting is one of the body's self defense mechanisms. Passing out puts the brain in a calmed state and causes it to shut down for a while, in order to begin the healing process.    Summary: 1. Refer to this scale when providing us with your pain level. 2. Be accurate and careful when reporting your pain level. This will help with your care. 3. Over-reporting your pain level will lead to loss of credibility. 4. Even a level of 1/10 means that there is pain and will be treated at our facility. 5. High, inaccurate reporting will be documented as "Symptom Exaggeration", leading to loss of credibility and suspicions of possible secondary gains such as obtaining more narcotics, or wanting to appear disabled, for  fraudulent reasons. 6. Only pain levels of 5 or below will be seen at our facility. 7. Pain levels of 6 and above will be sent to the Emergency Department and the appointment cancelled. ____________________________________________________________________________________________   BMI Assessment: Estimated body mass index is 33.25 kg/m as calculated from the following:   Height as of this encounter: 5\' 6"  (1.676 m).   Weight as of this encounter: 206 lb (93.4 kg).  BMI interpretation table: BMI level Category Range association with higher incidence of chronic pain  <18 kg/m2 Underweight   18.5-24.9 kg/m2 Ideal body weight   25-29.9 kg/m2 Overweight Increased incidence by 20%  30-34.9 kg/m2 Obese (Class I) Increased incidence by 68%  35-39.9 kg/m2 Severe obesity (Class II) Increased incidence by 136%  >40 kg/m2 Extreme obesity (Class III) Increased incidence by 254%   BMI Readings from Last 4 Encounters:  07/18/18 33.25 kg/m  06/06/18 32.26 kg/m  05/25/18 32.26 kg/m  05/08/18 31.32 kg/m   Wt Readings from Last 4 Encounters:  07/18/18 206 lb (93.4 kg)  06/06/18 206 lb (93.4 kg)  05/25/18 206 lb (93.4 kg)  05/08/18 200 lb (90.7 kg)

## 2018-07-18 NOTE — Telephone Encounter (Signed)
MRI results from march printed and placed up front for pick up . Jazmin Hartsell,CMA
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
## 2018-07-22 LAB — COMP. METABOLIC PANEL (12)
AST: 20 IU/L (ref 0–40)
Albumin/Globulin Ratio: 1.4 (ref 1.2–2.2)
Albumin: 4.3 g/dL (ref 3.5–5.5)
Alkaline Phosphatase: 75 IU/L (ref 39–117)
BILIRUBIN TOTAL: 0.4 mg/dL (ref 0.0–1.2)
BUN/Creatinine Ratio: 7 — ABNORMAL LOW (ref 9–23)
BUN: 5 mg/dL — ABNORMAL LOW (ref 6–24)
CALCIUM: 9.6 mg/dL (ref 8.7–10.2)
Chloride: 103 mmol/L (ref 96–106)
Creatinine, Ser: 0.74 mg/dL (ref 0.57–1.00)
GFR calc non Af Amer: 97 mL/min/{1.73_m2} (ref >59)
GFR, EST AFRICAN AMERICAN: 112 mL/min/{1.73_m2} (ref >59)
GLOBULIN, TOTAL: 3 g/dL (ref 1.5–4.5)
Glucose: 88 mg/dL (ref 65–99)
POTASSIUM: 4.1 mmol/L (ref 3.5–5.2)
Sodium: 138 mmol/L (ref 134–144)
Total Protein: 7.3 g/dL (ref 6.0–8.5)

## 2018-07-22 LAB — MAGNESIUM: MAGNESIUM: 2 mg/dL (ref 1.6–2.3)

## 2018-07-22 LAB — SEDIMENTATION RATE: Sed Rate: 6 mm/hr (ref 0–32)

## 2018-07-22 LAB — 25-HYDROXYVITAMIN D LCMS D2+D3: 25-HYDROXY, VITAMIN D-3: 21 ng/mL

## 2018-07-22 LAB — 25-HYDROXY VITAMIN D LCMS D2+D3: 25-Hydroxy, Vitamin D: 21 ng/mL — ABNORMAL LOW

## 2018-07-22 LAB — VITAMIN B12: Vitamin B-12: 271 pg/mL (ref 232–1245)

## 2018-07-22 LAB — C-REACTIVE PROTEIN

## 2018-07-23 ENCOUNTER — Encounter: Payer: Self-pay | Admitting: Nurse Practitioner

## 2018-07-23 DIAGNOSIS — E559 Vitamin D deficiency, unspecified: Secondary | ICD-10-CM | POA: Insufficient documentation

## 2018-07-24 ENCOUNTER — Ambulatory Visit
Admission: RE | Admit: 2018-07-24 | Discharge: 2018-07-24 | Disposition: A | Discharge status: Home / Self Care | Payer: Medicaid Other | Source: Ambulatory Visit | Attending: Neurosurgery | Admitting: Neurosurgery

## 2018-07-24 ENCOUNTER — Ambulatory Visit
Admission: RE | Admit: 2018-07-24 | Discharge: 2018-07-24 | Disposition: A | Discharge status: Home / Self Care | Payer: Medicaid Other | Source: Ambulatory Visit

## 2018-07-24 DIAGNOSIS — M5126 Other intervertebral disc displacement, lumbar region: Secondary | ICD-10-CM

## 2018-07-24 DIAGNOSIS — M4722 Other spondylosis with radiculopathy, cervical region: Secondary | ICD-10-CM

## 2018-07-25 LAB — COMPLIANCE DRUG ANALYSIS, UR
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
## 2018-08-06 ENCOUNTER — Other Ambulatory Visit: Payer: Self-pay | Admitting: *Deleted

## 2018-08-06 ENCOUNTER — Encounter: Payer: Self-pay | Admitting: Pain Medicine

## 2018-08-06 ENCOUNTER — Ambulatory Visit: Payer: Medicaid Other | Attending: Pain Medicine | Admitting: Pain Medicine

## 2018-08-06 ENCOUNTER — Other Ambulatory Visit: Payer: Self-pay

## 2018-08-06 VITALS — BP 150/77 | HR 86 | Temp 98.3°F | Resp 18 | Ht 66.0 in | Wt 206.0 lb

## 2018-08-06 DIAGNOSIS — M47812 Spondylosis without myelopathy or radiculopathy, cervical region: Secondary | ICD-10-CM | POA: Insufficient documentation

## 2018-08-06 DIAGNOSIS — M542 Cervicalgia: Secondary | ICD-10-CM

## 2018-08-06 DIAGNOSIS — G894 Chronic pain syndrome: Secondary | ICD-10-CM | POA: Diagnosis not present

## 2018-08-06 DIAGNOSIS — M79604 Pain in right leg: Secondary | ICD-10-CM

## 2018-08-06 DIAGNOSIS — M533 Sacrococcygeal disorders, not elsewhere classified: Secondary | ICD-10-CM

## 2018-08-06 DIAGNOSIS — Z79891 Long term (current) use of opiate analgesic: Secondary | ICD-10-CM

## 2018-08-06 DIAGNOSIS — M47817 Spondylosis without myelopathy or radiculopathy, lumbosacral region: Secondary | ICD-10-CM

## 2018-08-06 DIAGNOSIS — M961 Postlaminectomy syndrome, not elsewhere classified: Secondary | ICD-10-CM | POA: Diagnosis not present

## 2018-08-06 DIAGNOSIS — M51369 Other intervertebral disc degeneration, lumbar region without mention of lumbar back pain or lower extremity pain: Secondary | ICD-10-CM

## 2018-08-06 DIAGNOSIS — Z659 Problem related to unspecified psychosocial circumstances: Secondary | ICD-10-CM

## 2018-08-06 DIAGNOSIS — M25512 Pain in left shoulder: Secondary | ICD-10-CM | POA: Insufficient documentation

## 2018-08-06 DIAGNOSIS — M792 Neuralgia and neuritis, unspecified: Secondary | ICD-10-CM

## 2018-08-06 DIAGNOSIS — M5441 Lumbago with sciatica, right side: Secondary | ICD-10-CM | POA: Diagnosis not present

## 2018-08-06 DIAGNOSIS — G96198 Other disorders of meninges, not elsewhere classified: Secondary | ICD-10-CM

## 2018-08-06 DIAGNOSIS — Z79899 Other long term (current) drug therapy: Secondary | ICD-10-CM | POA: Insufficient documentation

## 2018-08-06 DIAGNOSIS — M7918 Myalgia, other site: Secondary | ICD-10-CM

## 2018-08-06 DIAGNOSIS — M5416 Radiculopathy, lumbar region: Secondary | ICD-10-CM

## 2018-08-06 DIAGNOSIS — G8929 Other chronic pain: Secondary | ICD-10-CM | POA: Insufficient documentation

## 2018-08-06 DIAGNOSIS — Z87898 Personal history of other specified conditions: Secondary | ICD-10-CM

## 2018-08-06 DIAGNOSIS — M47816 Spondylosis without myelopathy or radiculopathy, lumbar region: Secondary | ICD-10-CM

## 2018-08-06 DIAGNOSIS — T7491XS Unspecified adult maltreatment, confirmed, sequela: Secondary | ICD-10-CM

## 2018-08-06 DIAGNOSIS — G9619 Other disorders of meninges, not elsewhere classified: Secondary | ICD-10-CM

## 2018-08-06 DIAGNOSIS — M545 Low back pain: Secondary | ICD-10-CM | POA: Diagnosis not present

## 2018-08-06 DIAGNOSIS — M5388 Other specified dorsopathies, sacral and sacrococcygeal region: Secondary | ICD-10-CM

## 2018-08-06 DIAGNOSIS — M5136 Other intervertebral disc degeneration, lumbar region: Secondary | ICD-10-CM | POA: Insufficient documentation

## 2018-08-06 DIAGNOSIS — Z789 Other specified health status: Secondary | ICD-10-CM

## 2018-08-06 DIAGNOSIS — M5030 Other cervical disc degeneration, unspecified cervical region: Secondary | ICD-10-CM

## 2018-08-06 DIAGNOSIS — M79605 Pain in left leg: Secondary | ICD-10-CM

## 2018-08-06 DIAGNOSIS — E559 Vitamin D deficiency, unspecified: Secondary | ICD-10-CM

## 2018-08-06 DIAGNOSIS — M899 Disorder of bone, unspecified: Secondary | ICD-10-CM

## 2018-08-06 DIAGNOSIS — M5412 Radiculopathy, cervical region: Secondary | ICD-10-CM

## 2018-08-06 DIAGNOSIS — F1411 Cocaine abuse, in remission: Secondary | ICD-10-CM

## 2018-08-06 HISTORY — DX: Other cervical disc degeneration, unspecified cervical region: M50.30

## 2018-08-06 HISTORY — DX: Radiculopathy, lumbar region: M54.16

## 2018-08-06 HISTORY — DX: Spondylosis without myelopathy or radiculopathy, lumbar region: M47.816

## 2018-08-06 HISTORY — DX: Spondylosis without myelopathy or radiculopathy, cervical region: M47.812

## 2018-08-06 HISTORY — DX: Other intervertebral disc degeneration, lumbar region without mention of lumbar back pain or lower extremity pain: M51.369

## 2018-08-06 HISTORY — DX: Other intervertebral disc degeneration, lumbar region: M51.36

## 2018-08-06 MED ORDER — LIDOCAINE 5 % EX PTCH: 1.0000 | MEDICATED_PATCH | CUTANEOUS | 1 refills | Status: DC

## 2018-08-06 MED ORDER — ORPHENADRINE CITRATE 30 MG/ML IJ SOLN
INTRAMUSCULAR | Status: AC
  Filled 2018-08-06: qty 2

## 2018-08-06 MED ORDER — PREGABALIN 150 MG PO CAPS: 150.0000 mg | ORAL_CAPSULE | Freq: Two times a day (BID) | ORAL | 0 refills | Status: DC

## 2018-08-06 MED ORDER — KETOROLAC TROMETHAMINE 60 MG/2ML IM SOLN
60.0000 mg | Freq: Once | INTRAMUSCULAR | Status: AC
  Administered 2018-08-06: 60 mg via INTRAMUSCULAR

## 2018-08-06 MED ORDER — ORPHENADRINE CITRATE 30 MG/ML IJ SOLN
60.0000 mg | Freq: Once | INTRAMUSCULAR | Status: AC
  Administered 2018-08-06: 60 mg via INTRAMUSCULAR

## 2018-08-06 MED ORDER — KETOROLAC TROMETHAMINE 60 MG/2ML IM SOLN
INTRAMUSCULAR | Status: AC
  Filled 2018-08-06: qty 2

## 2018-08-06 MED ORDER — GNP CALCIUM 1200 1200-1000 MG-UNIT PO CHEW: 1200.0000 mg | CHEWABLE_TABLET | Freq: Every day | ORAL | 5 refills | Status: DC

## 2018-08-06 MED ORDER — MELOXICAM 15 MG PO TABS: 15.0000 mg | ORAL_TABLET | Freq: Every day | ORAL | 3 refills | Status: DC | PRN

## 2018-08-06 MED ORDER — ERGOCALCIFEROL 1.25 MG (50000 UT) PO CAPS: 50000.0000 [IU] | ORAL_CAPSULE | ORAL | 0 refills | Status: DC

## 2018-08-06 MED ORDER — CYCLOBENZAPRINE HCL 10 MG PO TABS: 10.0000 mg | ORAL_TABLET | Freq: Three times a day (TID) | ORAL | 0 refills | Status: DC | PRN

## 2018-08-06 MED ORDER — VITAMIN D3 125 MCG (5000 UT) PO CAPS: 1.0000 | ORAL_CAPSULE | Freq: Every day | ORAL | 5 refills | Status: AC

## 2018-08-06 MED ORDER — TRAMADOL HCL 50 MG PO TABS: 50.0000 mg | ORAL_TABLET | Freq: Four times a day (QID) | ORAL | 0 refills | Status: DC | PRN

## 2018-08-06 MED ORDER — MAGNESIUM 500 MG PO CAPS: 500.0000 mg | ORAL_CAPSULE | Freq: Two times a day (BID) | ORAL | 5 refills | Status: DC

## 2018-08-06 NOTE — Progress Notes (Signed)
Safety precautions to be maintained throughout the outpatient stay will include: orient to surroundings, keep bed in low position, maintain call bell within reach at all times, provide assistance with transfer out of bed and ambulation.  

## 2018-08-06 NOTE — Patient Instructions (Addendum)
______________You have been given a script for Tramadol x 1 znd Lyrica x 1.  ___You have been given pre procedure instructions for procedure with sedation. You have been given an IM injection for Toradol and NOrflex.  INstructed that this may make you sleepy.  ___________________________________________________________________________  Pain Scale  Introduction: The pain score used by this practice is the Verbal Numerical Rating Scale (VNRS-11). This is an 11-point scale. It is for adults and children 10 years or older. There are significant differences in how the pain score is reported, used, and applied. Forget everything you learned in the past and learn this scoring system.  General Information: The scale should reflect your current level of pain. Unless you are specifically asked for the level of your worst pain, or your average pain. If you are asked for one of these two, then it should be understood that it is over the past 24 hours.  Basic Activities of Daily Living (ADL): Personal hygiene, dressing, eating, transferring, and using restroom.  Instructions: Most patients tend to report their level of pain as a combination of two factors, their physical pain and their psychosocial pain. This last one is also known as "suffering" and it is reflection of how physical pain affects you socially and psychologically. From now on, report them separately. From this point on, when asked to report your pain level, report only your physical pain. Use the following table for reference.  Pain Clinic Pain Levels (0-5/10)  Pain Level Score  Description  No Pain 0   Mild pain 1 Nagging, annoying, but does not interfere with basic activities of daily living (ADL). Patients are able to eat, bathe, get dressed, toileting (being able to get on and off the toilet and perform personal hygiene functions), transfer (move in and out of bed or a chair without assistance), and maintain continence (able to control bladder  and bowel functions). Blood pressure and heart rate are unaffected. A normal heart rate for a healthy adult ranges from 60 to 100 bpm (beats per minute).   Mild to moderate pain 2 Noticeable and distracting. Impossible to hide from other people. More frequent flare-ups. Still possible to adapt and function close to normal. It can be very annoying and may have occasional stronger flare-ups. With discipline, patients may get used to it and adapt.   Moderate pain 3 Interferes significantly with activities of daily living (ADL). It becomes difficult to feed, bathe, get dressed, get on and off the toilet or to perform personal hygiene functions. Difficult to get in and out of bed or a chair without assistance. Very distracting. With effort, it can be ignored when deeply involved in activities.   Moderately severe pain 4 Impossible to ignore for more than a few minutes. With effort, patients may still be able to manage work or participate in some social activities. Very difficult to concentrate. Signs of autonomic nervous system discharge are evident: dilated pupils (mydriasis); mild sweating (diaphoresis); sleep interference. Heart rate becomes elevated (>115 bpm). Diastolic blood pressure (lower number) rises above 100 mmHg. Patients find relief in laying down and not moving.   Severe pain 5 Intense and extremely unpleasant. Associated with frowning face and frequent crying. Pain overwhelms the senses.  Ability to do any activity or maintain social relationships becomes significantly limited. Conversation becomes difficult. Pacing back and forth is common, as getting into a comfortable position is nearly impossible. Pain wakes you up from deep sleep. Physical signs will be obvious: pupillary dilation; increased sweating; goosebumps;  brisk reflexes; cold, clammy hands and feet; nausea, vomiting or dry heaves; loss of appetite; significant sleep disturbance with inability to fall asleep or to remain asleep. When  persistent, significant weight loss is observed due to the complete loss of appetite and sleep deprivation.  Blood pressure and heart rate becomes significantly elevated. Caution: If elevated blood pressure triggers a pounding headache associated with blurred vision, then the patient should immediately seek attention at an urgent or emergency care unit, as these may be signs of an impending stroke.    Emergency Department Pain Levels (6-10/10)  Emergency Room Pain 6 Severely limiting. Requires emergency care and should not be seen or managed at an outpatient pain management facility. Communication becomes difficult and requires great effort. Assistance to reach the emergency department may be required. Facial flushing and profuse sweating along with potentially dangerous increases in heart rate and blood pressure will be evident.   Distressing pain 7 Self-care is very difficult. Assistance is required to transport, or use restroom. Assistance to reach the emergency department will be required. Tasks requiring coordination, such as bathing and getting dressed become very difficult.   Disabling pain 8 Self-care is no longer possible. At this level, pain is disabling. The individual is unable to do even the most "basic" activities such as walking, eating, bathing, dressing, transferring to a bed, or toileting. Fine motor skills are lost. It is difficult to think clearly.   Incapacitating pain 9 Pain becomes incapacitating. Thought processing is no longer possible. Difficult to remember your own name. Control of movement and coordination are lost.   The worst pain imaginable 10 At this level, most patients pass out from pain. When this level is reached, collapse of the autonomic nervous system occurs, leading to a sudden drop in blood pressure and heart rate. This in turn results in a temporary and dramatic drop in blood flow to the brain, leading to a loss of consciousness. Fainting is one of the body's  self defense mechanisms. Passing out puts the brain in a calmed state and causes it to shut down for a while, in order to begin the healing process.    Summary: 1. Refer to this scale when providing [place] with your pain level. 2. Be accurate and careful when reporting your pain level. This will help with your care. 3. Over-reporting your pain level will lead to loss of credibility. 4. Even a level of 1/10 means that there is pain and will be treated at our facility. 5. High, inaccurate reporting will be documented as "Symptom Exaggeration", leading to loss of credibility and suspicions of possible secondary gains such as obtaining more narcotics, or wanting to appear disabled, for fraudulent reasons. 6. Only pain levels of 5 or below will be seen at our facility. 7. Pain levels of 6 and above will be sent to the Emergency Department and the appointment cancelled. ____________________________________________________________________________________________   ____________________________________________________________________________________________  General Risks and Possible Complications  Patient Responsibilities: It is important that you read this as it is part of your informed consent. It is our duty to inform you of the risks and possible complications associated with treatments offered to you. It is your responsibility as a patient to read this and to ask questions about anything that is not clear or that you believe was not covered in this document.  Patient's Rights: You have the right to refuse treatment. You also have the right to change your mind, even after initially having agreed to have the treatment done.  However, under this last option, if you wait until the last second to change your mind, you may be charged for the materials used up to that point.  Introduction: Medicine is not an Visual merchandiser. Everything in Medicine, including the lack of treatment(s), carries the potential for  danger, harm, or loss (which is by definition: Risk). In Medicine, a complication is a secondary problem, condition, or disease that can aggravate an already existing one. All treatments carry the risk of possible complications. The fact that a side effects or complications occurs, does not imply that the treatment was conducted incorrectly. It must be clearly understood that these can happen even when everything is done following the highest safety standards.  No treatment: You can choose not to proceed with the proposed treatment alternative. The "PRO(s)" would include: avoiding the risk of complications associated with the therapy. The "CON(s)" would include: not getting any of the treatment benefits. These benefits fall under one of three categories: diagnostic; therapeutic; and/or palliative. Diagnostic benefits include: getting information which can ultimately lead to improvement of the disease or symptom(s). Therapeutic benefits are those associated with the successful treatment of the disease. Finally, palliative benefits are those related to the decrease of the primary symptoms, without necessarily curing the condition (example: decreasing the pain from a flare-up of a chronic condition, such as incurable terminal cancer).  General Risks and Complications: These are associated to most interventional treatments. They can occur alone, or in combination. They fall under one of the following six (6) categories: no benefit or worsening of symptoms; bleeding; infection; nerve damage; allergic reactions; and/or death. 1. No benefits or worsening of symptoms: In Medicine there are no guarantees, only probabilities. No healthcare provider can ever guarantee that a medical treatment will work, they can only state the probability that it may. Furthermore, there is always the possibility that the condition may worsen, either directly, or indirectly, as a consequence of the treatment. 2. Bleeding: This is more  common if the patient is taking a blood thinner, either prescription or over the counter (example: Goody Powders, Fish oil, Aspirin, Garlic, etc.), or if suffering a condition associated with impaired coagulation (example: Hemophilia, cirrhosis of the liver, low platelet counts, etc.). However, even if you do not have one on these, it can still happen. If you have any of these conditions, or take one of these drugs, make sure to notify your treating physician. 3. Infection: This is more common in patients with a compromised immune system, either due to disease (example: diabetes, cancer, human immunodeficiency virus [HIV], etc.), or due to medications or treatments (example: therapies used to treat cancer and rheumatological diseases). However, even if you do not have one on these, it can still happen. If you have any of these conditions, or take one of these drugs, make sure to notify your treating physician. 4. Nerve Damage: This is more common when the treatment is an invasive one, but it can also happen with the use of medications, such as those used in the treatment of cancer. The damage can occur to small secondary nerves, or to large primary ones, such as those in the spinal cord and brain. This damage may be temporary or permanent and it may lead to impairments that can range from temporary numbness to permanent paralysis and/or brain death. 5. Allergic Reactions: Any time a substance or material comes in contact with our body, there is the possibility of an allergic reaction. These can range from a mild skin rash (  contact dermatitis) to a severe systemic reaction (anaphylactic reaction), which can result in death. 6. Death: In general, any medical intervention can result in death, most of the time due to an unforeseen  complication. ____________________________________________________________________________________________  ____________________________________________________________________________________________  Preparing for Procedure with Sedation  Instructions: . Oral Intake: Do not eat or drink anything for at least 8 hours prior to your procedure. . Transportation: Public transportation is not allowed. Bring an adult driver. The driver must be physically present in our waiting room before any procedure can be started. [name] Physical Assistance: Bring an adult physically capable of assisting you, in the event you need help. This adult should keep you company at home for at least 6 hours after the procedure. . Blood Pressure Medicine: Take your blood pressure medicine with a sip of water the morning of the procedure. . Blood thinners: Notify our staff if you are taking any blood thinners. Depending on which one you take, there will be specific instructions on how and when to stop it. . Diabetics on insulin: Notify the staff so that you can be scheduled 1st case in the morning. If your diabetes requires high dose insulin, take only  of your normal insulin dose the morning of the procedure and notify the staff that you have done so. . Preventing infections: Shower with an antibacterial soap the morning of your procedure. . Build-up your immune system: Take 1000 mg of Vitamin C with every meal (3 times a day) the day prior to your procedure. [name] Antibiotics: Inform the staff if you have a condition or reason that requires you to take antibiotics before dental procedures. . Pregnancy: If you are pregnant, call and cancel the procedure. . Sickness: If you have a cold, fever, or any active infections, call and cancel the procedure. . Arrival: You must be in the facility at least 30 minutes prior to your scheduled procedure. . Children: Do not bring children with you. . Dress appropriately: Bring dark clothing  that you would not mind if they get stained. . Valuables: Do not bring any jewelry or valuables.  Procedure appointments are reserved for interventional treatments only. [name] No Prescription Refills. . No medication changes will be discussed during procedure appointments. . No disability issues will be discussed.  Reasons to call and reschedule or cancel your procedure: (Following these recommendations will minimize the risk of a serious complication.) . Surgeries: Avoid having procedures within 2 weeks of any surgery. (Avoid for 2 weeks before or after any surgery). . Flu Shots: Avoid having procedures within 2 weeks of a flu shots or . (Avoid for 2 weeks before or after immunizations). . Barium: Avoid having a procedure within 7-10 days after having had a radiological study involving the use of radiological contrast. (Myelograms, Barium swallow or enema study). . Heart attacks: Avoid any elective procedures or surgeries for the initial 6 months after a "Myocardial Infarction" (Heart Attack). . Blood thinners: It is imperative that you stop these medications before procedures. Let us know if you if you take any blood thinner.  . Infection: Avoid procedures during or within two weeks of an infection (including chest colds or gastrointestinal problems). Symptoms associated with infections include: Localized redness, fever, chills, night sweats or profuse sweating, burning sensation when voiding, cough, congestion, stuffiness, runny nose, sore throat, diarrhea, nausea, vomiting, cold or Flu symptoms, recent or current infections. It is specially important if the infection is over the area that we intend to treat. [name] Heart and lung problems: Symptoms that  may suggest an active cardiopulmonary problem include: cough, chest pain, breathing difficulties or shortness of breath, dizziness, ankle swelling, uncontrolled high or unusually low blood pressure, and/or palpitations. If you are experiencing any of these  symptoms, cancel your procedure and contact your primary care physician for an evaluation.  Remember:  Regular Business hours are:  Monday to Thursday 8:00 AM to 4:00 PM  Provider's Schedule: Delano Metz, MD:  Procedure days: Tuesday and Thursday 7:30 AM to 4:00 PM  Edward Jolly, MD:  Procedure days: Monday and Wednesday 7:30 AM to 4:00 PM ____________________________________________________________________________________________   ____________________________________________________________________________________________  Medication Rules  Applies to: All patients receiving prescriptions (written or electronic).  Pharmacy of record: Pharmacy where electronic prescriptions will be sent. If written prescriptions are taken to a different pharmacy, please inform the nursing staff. The pharmacy listed in the electronic medical record should be the one where you would like electronic prescriptions to be sent.  Prescription refills: Only during scheduled appointments. Applies to both, written and electronic prescriptions.  NOTE: The following applies primarily to controlled substances (Opioid* Pain Medications).   Patient's responsibilities: 1. Pain Pills: Bring all pain pills to every appointment (except for procedure appointments). 2. Pill Bottles: Bring pills in original pharmacy bottle. Always bring newest bottle. Bring bottle, even if empty. 3. Medication refills: You are responsible for knowing and keeping track of what medications you need refilled. The day before your appointment, write a list of all prescriptions that need to be refilled. Bring that list to your appointment and give it to the admitting nurse. Prescriptions will be written only during appointments. If you forget a medication, it will not be "Called in", "Faxed", or "electronically sent". You will need to get another appointment to get these prescribed. 4. Prescription Accuracy: You are responsible for  carefully inspecting your prescriptions before leaving our office. Have the discharge nurse carefully go over each prescription with you, before taking them home. Make sure that your name is accurately spelled, that your address is correct. Check the name and dose of your medication to make sure it is accurate. Check the number of pills, and the written instructions to make sure they are clear and accurate. Make sure that you are given enough medication to last until your next medication refill appointment. 5. Taking Medication: Take medication as prescribed. Never take more pills than instructed. Never take medication more frequently than prescribed. Taking less pills or less frequently is permitted and encouraged, when it comes to controlled substances (written prescriptions).  6. Inform other Doctors: Always inform, all of your healthcare providers, of all the medications you take. 7. Pain Medication from other Providers: You are not allowed to accept any additional pain medication from any other Doctor or Healthcare provider. There are two exceptions to this rule. (see below) In the event that you require additional pain medication, you are responsible for notifying us, as stated below. 8. Medication Agreement: You are responsible for carefully reading and following our Medication Agreement. This must be signed before receiving any prescriptions from our practice. Safely store a copy of your signed Agreement. Violations to the Agreement will result in no further prescriptions. (Additional copies of our Medication Agreement are available upon request.) 9. Laws, Rules, & Regulations: All patients are expected to follow all [address] and [employer], Rules, [employer]. Ignorance of the Laws does not constitute a valid excuse. The use of any illegal substances is prohibited. 10. Adopted CDC guidelines & recommendations: Target dosing levels will be  at or below 60 MME/day. Use of benzodiazepines** is  not recommended.  Exceptions: There are only two exceptions to the rule of not receiving pain medications from other Healthcare Providers. 1. Exception #1 (Emergencies): In the event of an emergency (i.e.: accident requiring emergency care), you are allowed to receive additional pain medication. However, you are responsible for: As soon as you are able, call our office [phone], at any time of the day or night, and leave a message stating your name, the date and nature of the emergency, and the name and dose of the medication prescribed. In the event that your call is answered by a member of our staff, make sure to document and save the date, time, and the name of the person that took your information.  2. Exception #2 (Planned Surgery): In the event that you are scheduled by another doctor or dentist to have any type of surgery or procedure, you are allowed (for a period no longer than 30 days), to receive additional pain medication, for the acute post-op pain. However, in this case, you are responsible for picking up a copy of our "Post-op Pain Management for Surgeons" handout, and giving it to your surgeon or dentist. This document is available at our office, and does not require an appointment to obtain it. Simply go to our office during business hours (Monday-Thursday from 8:00 AM to 4:00 PM) (Friday 8:00 AM to 12:00 Noon) or if you have a scheduled appointment with [place], prior to your surgery, and ask for it by name. In addition, you will need to provide [place] with your name, name of your surgeon, type of surgery, and date of procedure or surgery.  *Opioid medications include: morphine, codeine, oxycodone, oxymorphone, hydrocodone, hydromorphone, meperidine, tramadol, tapentadol, buprenorphine, fentanyl, methadone. **Benzodiazepine medications include: diazepam (Valium), alprazolam (Xanax), clonazepam (Klonopine), lorazepam (Ativan), clorazepate (Tranxene), chlordiazepoxide (Librium), estazolam  (Prosom), oxazepam (Serax), temazepam (Restoril), triazolam (Halcion) (Last updated: 02/08/2018) ____________________________________________________________________________________________   ____________________________________________________________________________________________  Medication Recommendations and Reminders  Applies to: All patients receiving prescriptions (written and/or electronic).  Medication Rules & Regulations: These rules and regulations exist for your safety and that of others. They are not flexible and neither are we. Dismissing or ignoring them will be considered "non-compliance" with medication therapy, resulting in complete and irreversible termination of such therapy. (See document titled "Medication Rules" for more details.) In all conscience, because of safety reasons, we cannot continue providing a therapy where the patient does not follow instructions.  Pharmacy of record:   Definition: This is the pharmacy where your electronic prescriptions will be sent.   We do not endorse any particular pharmacy.  You are not restricted in your choice of pharmacy.  The pharmacy listed in the electronic medical record should be the one where you want electronic prescriptions to be sent.  If you choose to change pharmacy, simply notify our nursing staff of your choice of new pharmacy.  Recommendations:  Keep all of your pain medications in a safe place, under lock and key, even if you live alone.   After you fill your prescription, take 1 week's worth of pills and put them away in a safe place. You should keep a separate, properly labeled bottle for this purpose. The remainder should be kept in the original bottle. Use this as your primary supply, until it runs out. Once it's gone, then you know that you have 1 week's worth of medicine, and it is time to come in for a prescription refill. If you  do this correctly, it is unlikely that you will ever run out of  medicine.  To make sure that the above recommendation works, it is very important that you make sure your medication refill appointments are scheduled at least 1 week before you run out of medicine. To do this in an effective manner, make sure that you do not leave the office without scheduling your next medication management appointment. Always ask the nursing staff to show you in your prescription , when your medication will be running out. Then arrange for the receptionist to get you a return appointment, at least 7 days before you run out of medicine. Do not wait until you have 1 or 2 pills left, to come in. This is very poor planning and does not take into consideration that we may need to cancel appointments due to bad weather, sickness, or emergencies affecting our staff.  "Partial Fill": If for any reason your pharmacy does not have enough pills/tablets to completely fill or refill your prescription, do not allow for a "partial fill". You will need a separate prescription to fill the remaining amount, which we will not provide. If the reason for the partial fill is your insurance, you will need to talk to the pharmacist about payment alternatives for the remaining tablets, but again, do not accept a partial fill.  Prescription refills and/or changes in medication(s):   Prescription refills, and/or changes in dose or medication, will be conducted only during scheduled medication management appointments. (Applies to both, written and electronic prescriptions.)  No refills on procedure days. No medication will be changed or started on procedure days. No changes, adjustments, and/or refills will be conducted on a procedure day. Doing so will interfere with the diagnostic portion of the procedure.  No phone refills. No medications will be "called into the pharmacy".  No Fax refills.  No weekend refills.  No Holliday refills.  No after hours refills.  Remember:  Business hours are:  Monday  to Thursday 8:00 AM to 4:00 PM Provider's Schedule: Thad Ranger, NP - Appointments are:  Medication management: Monday to Thursday 8:00 AM to 4:00 PM Delano Metz, MD - Appointments are:  Medication management: Monday and Wednesday 8:00 AM to 4:00 PM Procedure day: Tuesday and Thursday 7:30 AM to 4:00 PM Edward Jolly, MD - Appointments are:  Medication management: Tuesday and Thursday 8:00 AM to 4:00 PM Procedure day: Monday and Wednesday 7:30 AM to 4:00 PM (Last update: 02/08/2018) ____________________________________________________________________________________________   ____________________________________________________________________________________________  CANNABIDIOL (AKA: CBD Oil or Pills)  Applies to: All patients receiving prescriptions of controlled substances (written and/or electronic).  General Information: Cannabidiol (CBD) was discovered in 55. It is one of some 113 identified cannabinoids in cannabis (Marijuana) plants, accounting for up to 40% of the plant's extract. As of 2018, preliminary clinical research on cannabidiol included studies of anxiety, cognition, movement disorders, and pain.  Cannabidiol is consummed in multiple ways, including inhalation of cannabis smoke or vapor, as an aerosol spray into the cheek, and by mouth. It may be supplied as CBD oil containing CBD as the active ingredient (no added tetrahydrocannabinol (THC) or terpenes), a full-plant CBD-dominant hemp extract oil, capsules, dried cannabis, or as a liquid solution. CBD is thought not have the same psychoactivity as THC, and may affect the actions of THC. Studies suggest that CBD may interact with different biological targets, including cannabinoid receptors and other neurotransmitter receptors. As of 2018 the mechanism of action for its biological effects has not been determined.  In the [place], cannabidiol has a limited approval by the Food and Drug Administration (FDA) for  treatment of only two types of epilepsy disorders. The side effects of long-term use of the drug include somnolence, decreased appetite, diarrhea, fatigue, malaise, weakness, sleeping problems, and others.  CBD remains a Schedule I drug prohibited for any use.  Legality: Some manufacturers ship CBD products nationally, an illegal action which the FDA has not enforced in 2018, with CBD remaining the subject of an FDA investigational new drug evaluation, and is not considered legal as a dietary supplement or food ingredient as of December 2018. Federal illegality has made it difficult historically to conduct research on CBD. CBD is openly sold in head shops and health food stores in some states where such sales have not been explicitly legalized.  Warning: Because it is not FDA approved for general use or treatment of pain, it is not required to undergo the same manufacturing controls as prescription drugs.  This means that the available cannabidiol (CBD) may be contaminated with THC.  If this is the case, it will trigger a positive urine drug screen (UDS) test for cannabinoids (Marijuana).  Because a positive UDS for illicit substances is a violation of our medication agreement, your opioid analgesics (pain medicine) may be permanently discontinued. (Last update: 03/01/2018) ____________________________________________________________________________________________   Preparing for Procedure with Sedation Instructions: . Oral Intake: Do not eat or drink anything for at least 8 hours prior to your procedure. . Transportation: Public transportation is not allowed. Bring an adult driver. The driver must be physically present in our waiting room before any procedure can be started. [name]. Physical Assistance: Bring an adult capable of physically assisting you, in the event you need help. . Blood Pressure Medicine: Take your blood pressure medicine with a sip of water the morning of the procedure. . Insulin:  Take only  of your normal insulin dose. . Preventing infections: Shower with an antibacterial soap the morning of your procedure. . Build-up your immune system: Take 1000 mg of Vitamin C with every meal (3 times a day) the day prior to your procedure. . Pregnancy: If you are pregnant, call and cancel the procedure. . Sickness: If you have a cold, fever, or any active infections, call and cancel the procedure. . Arrival: You must be in the facility at least 30 minutes prior to your scheduled procedure. . Children: Do not bring children with you. . Dress appropriately: Bring dark clothing that you would not mind if they get stained. . Valuables: Do not bring any jewelry or valuables. Procedure appointments are reserved for interventional treatments only. [name]. No Prescription Refills. . No medication changes will be discussed during procedure appointments. No disability issues will be discussed.Facet Blocks Patient Information  Description: The facets are joints in the spine between the vertebrae.  Like any joints in the body, facets can become irritated and painful.  Arthritis can also effect the facets.  By injecting steroids and local anesthetic in and around these joints, we can temporarily block the nerve supply to them.  Steroids act directly on irritated nerves and tissues to reduce selling and inflammation which often leads to decreased pain.  Facet blocks may be done anywhere along the spine from the neck to the low back depending upon the location of your pain.   After numbing the skin with local anesthetic (like Novocaine), a small needle is passed onto the facet joints under x-ray guidance.  You may experience a sensation of pressure  while this is being done.  The entire block usually lasts about 15-25 minutes.   Conditions which may be treated by facet blocks:  Low back/buttock pain Neck/shoulder pain Certain types of headaches  Preparation for the injection:  Do not eat any solid  food or dairy products within 8 hours of your appointment. You may drink clear liquid up to 3 hours before appointment.  Clear liquids include water, black coffee, juice or soda.  No milk or cream please. You may take your regular medication, including pain medications, with a sip of water before your appointment.  Diabetics should hold regular insulin (if taken separately) and take 1/2 normal NPH dose the morning of the procedure.  Carry some sugar containing items with you to your appointment. A driver must accompany you and be prepared to drive you home after your procedure. Bring all your current medications with you. An IV may be inserted and sedation may be given at the discretion of the physician. A blood pressure cuff, EKG and other monitors will often be applied during the procedure.  Some patients may need to have extra oxygen administered for a short period. You will be asked to provide medical information, including your allergies and medications, prior to the procedure.  We must know immediately if you are taking blood thinners (like Coumadin/Warfarin) or if you are allergic to IV iodine contrast (dye).  We must know if you could possible be pregnant.  Possible side-effects:  Bleeding from needle site Infection (rare, may require surgery) Nerve injury (rare) Numbness & tingling (temporary) Difficulty urinating (rare, temporary) Spinal headache (a headache worse with upright posture) Light-headedness (temporary) Pain at injection site (serveral days) Decreased blood pressure (rare, temporary) Weakness in arm/leg (temporary) Pressure sensation in back/neck (temporary)   Call if you experience:  Fever/chills associated with headache or increased back/neck pain Headache worsened by an upright position New onset, weakness or numbness of an extremity below the injection site Hives or difficulty breathing (go to the emergency room) Inflammation or drainage at the injection  site(s) Severe back/neck pain greater than usual New symptoms which are concerning to you  Please note:  Although the local anesthetic injected can often make your back or neck feel good for several hours after the injection, the pain will likely return. It takes 3-7 days for steroids to work.  You may not notice any pain relief for at least one week.  If effective, we will often do a series of 2-3 injections spaced 3-6 weeks apart to maximally decrease your pain.  After the initial series, you may be a candidate for a more permanent nerve block of the facets.  If you have any questions, please call #[phone]) [phone] . [facility]

## 2018-08-14 ENCOUNTER — Ambulatory Visit: Payer: Medicaid Other | Admitting: Licensed Clinical Social Worker

## 2018-08-14 ENCOUNTER — Other Ambulatory Visit: Payer: Self-pay

## 2018-08-14 ENCOUNTER — Ambulatory Visit (INDEPENDENT_AMBULATORY_CARE_PROVIDER_SITE_OTHER): Payer: Medicaid Other | Admitting: Family Medicine

## 2018-08-14 ENCOUNTER — Telehealth: Payer: Self-pay | Admitting: Family Medicine

## 2018-08-14 ENCOUNTER — Encounter: Payer: Self-pay | Admitting: Licensed Clinical Social Worker

## 2018-08-14 ENCOUNTER — Other Ambulatory Visit (HOSPITAL_COMMUNITY)
Admission: RE | Admit: 2018-08-14 | Discharge: 2018-08-14 | Disposition: A | Discharge status: Home / Self Care | Payer: Medicaid Other | Source: Ambulatory Visit | Attending: Family Medicine | Admitting: Family Medicine

## 2018-08-14 ENCOUNTER — Encounter: Payer: Self-pay | Admitting: Family Medicine

## 2018-08-14 VITALS — BP 135/82 | HR 88 | Temp 98.0°F | Wt 218.0 lb

## 2018-08-14 DIAGNOSIS — F418 Other specified anxiety disorders: Secondary | ICD-10-CM | POA: Diagnosis not present

## 2018-08-14 DIAGNOSIS — N760 Acute vaginitis: Secondary | ICD-10-CM | POA: Diagnosis not present

## 2018-08-14 DIAGNOSIS — R519 Headache, unspecified: Secondary | ICD-10-CM | POA: Insufficient documentation

## 2018-08-14 DIAGNOSIS — R4589 Other symptoms and signs involving emotional state: Secondary | ICD-10-CM

## 2018-08-14 DIAGNOSIS — R51 Headache: Secondary | ICD-10-CM | POA: Diagnosis not present

## 2018-08-14 DIAGNOSIS — N898 Other specified noninflammatory disorders of vagina: Secondary | ICD-10-CM | POA: Diagnosis not present

## 2018-08-14 DIAGNOSIS — K5900 Constipation, unspecified: Secondary | ICD-10-CM

## 2018-08-14 DIAGNOSIS — I10 Essential (primary) hypertension: Secondary | ICD-10-CM

## 2018-08-14 DIAGNOSIS — T450X5A Adverse effect of antiallergic and antiemetic drugs, initial encounter: Secondary | ICD-10-CM | POA: Insufficient documentation

## 2018-08-14 DIAGNOSIS — T8089XA Other complications following infusion, transfusion and therapeutic injection, initial encounter: Secondary | ICD-10-CM

## 2018-08-14 DIAGNOSIS — F329 Major depressive disorder, single episode, unspecified: Secondary | ICD-10-CM

## 2018-08-14 DIAGNOSIS — E669 Obesity, unspecified: Secondary | ICD-10-CM

## 2018-08-14 LAB — POCT WET PREP (WET MOUNT)
Clue Cells Wet Prep Whiff POC: NEGATIVE
TRICHOMONAS WET PREP HPF POC: ABSENT

## 2018-08-14 MED ORDER — CLOTRIMAZOLE 1 % VA CREA: 1.0000 | TOPICAL_CREAM | Freq: Every day | VAGINAL | 0 refills | Status: AC

## 2018-08-14 MED ORDER — ESCITALOPRAM OXALATE 10 MG PO TABS: 10.0000 mg | ORAL_TABLET | Freq: Every day | ORAL | 1 refills | Status: DC

## 2018-08-14 MED ORDER — DOCUSATE SODIUM 100 MG PO CAPS: 100.0000 mg | ORAL_CAPSULE | Freq: Two times a day (BID) | ORAL | 3 refills | Status: DC

## 2018-08-14 NOTE — Patient Instructions (Signed)

## 2018-08-14 NOTE — Progress Notes (Signed)
Subjective:     Patient ID: Lisa Newton, female   DOB: Apr 05, 1971, 47 y.o.   MRN: [id number]  HPI HTN:Now compliant with her HCTZ. Here for f/u. Allergic reaction: Itchy and dark spots on her face. Feels it is related to Gadolinium which she got 3 months ago for CT scan. She was advised to be referred to an allergist for evaluation. Headaches: Patient c/o occasional. Currently no headache. She stated she had recent cervical MRI of her cervical spine which showed something on her skull. She attributed her headache to this findings. Denies any other concern. Constipation: She moves her bowel 1 time per week but now 1 every 2 weeks. Stool is hard, no blood in her stool, drink enough water and fiber. Denies abdominal pain. Depression:Having depression symptoms due to pain and financial situation. Weight: Concern about weight gain over the last few weeks. She feels like it is related to her pain meds. She has been eating a lot. Vaginitis: She stated that she had sex recently and used latex condoms. She has documented latex allergy, she could not tell what the reaction is. She however stated that she has been having irritation down there. This was a discussion she brought up after her visit had ended.  Side note, she has disability hearing in Dec.  Current Outpatient Medications on File Prior to Visit  Medication Sig Dispense Refill  . Calcium Carbonate-Vit D-Min (GNP CALCIUM 1200) 1200-1000 MG-UNIT CHEW Chew 1,200 mg by mouth daily with breakfast. Take in combination with vitamin D and magnesium. 30 tablet 5  . Cholecalciferol (VITAMIN D3) 5000 units CAPS Take 1 capsule (5,000 Units total) by mouth daily with breakfast. Take along with calcium and magnesium. 30 capsule 5  . cyclobenzaprine (FLEXERIL) 10 MG tablet Take 1 tablet (10 mg total) by mouth 3 (three) times daily as needed for muscle spasms. 90 tablet 0  . ergocalciferol (VITAMIN D2) 50000 units capsule Take 1 capsule (50,000 Units total)  by mouth 2 (two) times a week. X 6 weeks. 12 capsule 0  . hydrochlorothiazide (MICROZIDE) 12.5 MG capsule TAKE 1 CAPSULE (12.5 MG TOTAL) BY MOUTH DAILY. HOLD IF BP IS CONSISTENTLY LESS THAN 130/80 30 capsule 1  . lidocaine (LIDODERM) 5 % Place 1 patch onto the skin daily. Remove & Discard patch within 12 hours or as directed by MD 30 patch 1  . Magnesium 500 MG CAPS Take 1 capsule (500 mg total) by mouth 2 (two) times daily at 8 am and 10 pm. 60 capsule 5  . meloxicam (MOBIC) 15 MG tablet Take 1 tablet (15 mg total) by mouth daily as needed for pain. 90 tablet 3  . pregabalin (LYRICA) 150 MG capsule Take 1 capsule (150 mg total) by mouth 2 (two) times daily. 60 capsule 0  . traMADol (ULTRAM) 50 MG tablet Take 1 tablet (50 mg total) by mouth every 6 (six) hours as needed for severe pain. 120 tablet 0   No current facility-administered medications on file prior to visit.    Past Medical History:  Diagnosis Date  . Arthritis   . Depression 01/11/2012  . DVT (deep venous thrombosis) (HCC) 04/07/2015  . Hypertension   . Left ear hearing loss   . Neck pain   . PONV (postoperative nausea and vomiting)    Vitals:   08/14/18 1007  BP: 135/82  Pulse: 88  Temp: 98 F (36.7 C)  TempSrc: Oral  SpO2: 97%  Weight: 218 lb (98.9 kg)     Review  of Systems  HENT: Negative for ear discharge.   Respiratory: Negative.   Cardiovascular: Negative.   Gastrointestinal: Positive for constipation. Negative for abdominal pain, anal bleeding, rectal pain and vomiting.  Genitourinary: Positive for vaginal discharge.  Musculoskeletal: Negative.   Skin:       Itchy skin and dark spot on face  Neurological: Positive for headaches.       Currently no headache  Psychiatric/Behavioral:       Anxiety and depression  All other systems reviewed and are negative.      Objective:   Physical Exam  Constitutional: She is oriented to person, place, and time. She appears well-developed. No distress.   Cardiovascular: Normal rate, regular rhythm and normal heart sounds.  No murmur heard. Pulmonary/Chest: Effort normal and breath sounds normal. No stridor. No respiratory distress. She has no wheezes.  Abdominal: Soft. Bowel sounds are normal. She exhibits no distension and no mass. There is no tenderness. There is no guarding.  Genitourinary:  Genitourinary Comments: Deferred  Musculoskeletal: Normal range of motion. She exhibits no edema.  Neurological: She is alert and oriented to person, place, and time. She displays no atrophy and no tremor. Coordination normal.  Skin: No rash noted.  Psychiatric: Her speech is normal and behavior is normal. Judgment normal. Her mood appears not anxious. Her affect is not angry and not inappropriate. She is not actively hallucinating. Cognition and memory are normal. She exhibits a depressed mood. She expresses no homicidal and no suicidal ideation. She expresses no suicidal plans and no homicidal plans. She is attentive.  Nursing note and vitals reviewed. Body mass index is 35.19 kg/m.    Office Visit from 08/14/2018 in [facility]  PHQ-9 Total Score  21        GAD 7 : Generalized Anxiety Score 08/14/2018 03/01/2018 02/28/2018 03/28/2017  Nervous, Anxious, on Edge 3 2 3 2   Control/stop worrying 3 3 3  0  Worry too much - different things 3 3 3 1   Trouble relaxing 3 3 3  0  Restless 3 2 2  0  Easily annoyed or irritable 3 3 2  0  Afraid - awful might happen 3 0 3 0  Total GAD 7 Score 21 16 19 3   Anxiety Difficulty Extremely difficult - - -      Assessment:     HTN Skin Allergy Headache Constipation Obese (Morbid with HTN)   Vaginitis Depression Plan:     Check problem list. More than

## 2018-08-14 NOTE — Assessment & Plan Note (Signed)
Chronic. Improve fiber diet and keep well hydrated. I recommended OTC Miralax or Colace. She stated she wanted it to be prescribed. Colace prescribed and I advised her that insurance might not cover it.

## 2018-08-14 NOTE — Progress Notes (Signed)
Integrated Behavioral Health Initial Visit  MRN: [id number] Name: Lisa Newton Number of Integrated Behavioral Health Clinician visits:: 1/6 Total time: 20 minutes Type of Service: Integrated Behavioral Health- Individual/Family Interpretor:No. Interpretor Name and Language: NA   Warm Hand Off Completed.      SUBJECTIVE: Lisa Newton is a 47 y.o. female referred by Dr. Lum Babe for assistance with managing stressors and symptom of depression. Patient reports the following symptoms/concerns: financial stressors, difficult managing pain and symptoms of depression,  Duration of problem: on and off for several years ;  OBJECTIVE: Mood: Depressed and Affect: Appropriate tearful at times Risk of harm to self or others: No plan to harm self or others LIFE CONTEXT: Family and Social: lives with adult daughter and minor daughter. Strong Faith and support system with 2 adult children. School/Work: disability pending, no income, receives food benefits and Medicaid. Self-Care: spending time with Grandchildren Life Changes: chronic pain and financial stressors  GOALS ADDRESSED: Patient will: 1. Reduce symptoms of: anxiety, depression and stress 2. Increase knowledge and/or ability of: coping skills, self-management skills and stress reduction  3. Demonstrate ability to: Increase healthy adjustment to current life circumstances INTERVENTIONS: Interventions utilized: Solution-Focused Strategies, Supportive Counseling and Psychoeducation and/or Health Education  Standardized Assessments completed by PCP:  GAD=21 and PHQ-9=21. ASSESSMENT: Patient currently experiencing symptoms of depression and anxiety.  Symptoms worsning with chronic financial stressors and pain The following was discussed: Integrated care services, support system, previous and current coping skills, community resources ,education on PACING, things patient enjoy or use to enjoy doing as well as reservation for taking  psychotropic medication and benefits of psychotherapy.   Patient may benefit from and is in agreement to return for ongoing assessment and brief interventions to assist with managing her symptoms. PLAN: 1. Follow up with behavioral health clinician in 2 weeks 2. Behavioral recommendations: PACING for pain,  3. Referral(s): [facility]  Sammuel Hines, LCSW Licensed Clinical Social Worker [facility]   [phone] 11:28 AM

## 2018-08-14 NOTE — Assessment & Plan Note (Signed)
BMI>35 plus HTN. Likely due to increase intake with reduced exercise. She will work on diet and exercise. Consider nutrition referral at next visit.

## 2018-08-14 NOTE — Telephone Encounter (Signed)
I called to discuss med with her. I recommended Diflucan for yeast vaginitis. She declined it, she stated that her insurance does not cover it. She will get Clotrimazole OTC instead. I escribed it so she can know what to ask for at the pharmacy.  I counseled her about Lexapro for Depression and Anxiety and also advise that she stop Meloxicam which can cross react with Lexapro. Use Tylenol instead as needed in addition to Tramadol. SHe agreed with the plan and verbalized understanding. Teach back process completed.

## 2018-08-14 NOTE — Assessment & Plan Note (Signed)
Currently asymptomatic. Attributed to her past parotitis. MRI cervical spine reviewed which showed chronic temporal bone change. Patient stated that the ordering provider advised her to f/u with ENT and she has an upcoming appointment next Monday at 4 PM.   IMPRESSION: 1. No acute findings demonstrated within the cervical spine. 2. 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 3. Stable postsurgical changes at C6-7 and residual small posterolateral disc protrusion on the left. 4. 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

## 2018-08-14 NOTE — Assessment & Plan Note (Addendum)
[facility] counseling offered and she agreed. Warm handoff completed with Sammuel Hines. I went back in to discuss plan with patient after [facility] evaluation. She will f/u at the [facility] clinic for counseling in 2 weeks and then in 4 weeks. I started her on antidepressant today. She is amendable to plan.

## 2018-08-14 NOTE — Assessment & Plan Note (Signed)
??   To Gadolinium. Referral to Allergist done for skin testing.

## 2018-08-14 NOTE — Assessment & Plan Note (Signed)
BP running high lately per patient. She is now back on HCTZ 12.5 mg qd. BP looks good today on current regimen. COntinue to monitor closely at home.

## 2018-08-14 NOTE — Assessment & Plan Note (Addendum)
Unable to complete GU exam. Self swab recommended. GC/ Chlamydia as well as Wet prep completed today. + Yeast vaginitis.  I called her with the result. Will escribe Diflucan.    NB: I called to discuss med with her. I recommended Diflucan for yeast vaginitis. She declined it, she stated that her insurance does not cover it. She will get Clotrimazole OTC instead. I escribed it so she can know what to ask for at the pharmacy.

## 2018-08-15 LAB — CERVICOVAGINAL ANCILLARY ONLY
CHLAMYDIA, DNA PROBE: NEGATIVE
Neisseria Gonorrhea: NEGATIVE

## 2018-08-16 ENCOUNTER — Encounter: Payer: Self-pay | Admitting: Family Medicine

## 2018-08-21 ENCOUNTER — Ambulatory Visit
Admission: RE | Admit: 2018-08-21 | Discharge: 2018-08-21 | Disposition: A | Discharge status: Home / Self Care | Payer: Medicaid Other | Source: Ambulatory Visit | Attending: Pain Medicine | Admitting: Pain Medicine

## 2018-08-21 ENCOUNTER — Ambulatory Visit: Payer: Medicaid Other | Admitting: Pain Medicine

## 2018-08-21 ENCOUNTER — Other Ambulatory Visit: Payer: Self-pay

## 2018-08-21 ENCOUNTER — Encounter (INDEPENDENT_AMBULATORY_CARE_PROVIDER_SITE_OTHER): Payer: Self-pay

## 2018-08-21 ENCOUNTER — Encounter: Payer: Self-pay | Admitting: Pain Medicine

## 2018-08-21 VITALS — BP 150/95 | HR 79 | Temp 98.9°F | Resp 13 | Ht 66.0 in | Wt 218.0 lb

## 2018-08-21 DIAGNOSIS — Z79899 Other long term (current) drug therapy: Secondary | ICD-10-CM | POA: Diagnosis not present

## 2018-08-21 DIAGNOSIS — M5441 Lumbago with sciatica, right side: Secondary | ICD-10-CM | POA: Insufficient documentation

## 2018-08-21 DIAGNOSIS — M47816 Spondylosis without myelopathy or radiculopathy, lumbar region: Secondary | ICD-10-CM | POA: Diagnosis not present

## 2018-08-21 DIAGNOSIS — G8929 Other chronic pain: Secondary | ICD-10-CM

## 2018-08-21 DIAGNOSIS — Z882 Allergy status to sulfonamides status: Secondary | ICD-10-CM | POA: Insufficient documentation

## 2018-08-21 DIAGNOSIS — M47817 Spondylosis without myelopathy or radiculopathy, lumbosacral region: Secondary | ICD-10-CM | POA: Diagnosis not present

## 2018-08-21 DIAGNOSIS — Z888 Allergy status to other drugs, medicaments and biological substances status: Secondary | ICD-10-CM | POA: Insufficient documentation

## 2018-08-21 DIAGNOSIS — Z880 Allergy status to penicillin: Secondary | ICD-10-CM | POA: Diagnosis not present

## 2018-08-21 DIAGNOSIS — M545 Low back pain: Secondary | ICD-10-CM | POA: Diagnosis present

## 2018-08-21 MED ORDER — LACTATED RINGERS IV SOLN
1000.0000 mL | Freq: Once | INTRAVENOUS | Status: AC
  Administered 2018-08-21: 1000 mL via INTRAVENOUS

## 2018-08-21 MED ORDER — LIDOCAINE HCL 2 % IJ SOLN
20.0000 mL | Freq: Once | INTRAMUSCULAR | Status: AC
  Administered 2018-08-21: 400 mg
  Filled 2018-08-21: qty 40

## 2018-08-21 MED ORDER — MIDAZOLAM HCL 5 MG/5ML IJ SOLN
1.0000 mg | INTRAMUSCULAR | Status: DC | PRN
  Administered 2018-08-21: 3 mg via INTRAVENOUS
  Filled 2018-08-21: qty 5

## 2018-08-21 MED ORDER — TRIAMCINOLONE ACETONIDE 40 MG/ML IJ SUSP
80.0000 mg | Freq: Once | INTRAMUSCULAR | Status: AC
  Administered 2018-08-21: 40 mg
  Filled 2018-08-21: qty 2

## 2018-08-21 MED ORDER — FENTANYL CITRATE (PF) 100 MCG/2ML IJ SOLN
25.0000 ug | INTRAMUSCULAR | Status: DC | PRN
  Administered 2018-08-21: 100 ug via INTRAVENOUS
  Filled 2018-08-21: qty 2

## 2018-08-21 MED ORDER — ROPIVACAINE HCL 2 MG/ML IJ SOLN
18.0000 mL | Freq: Once | INTRAMUSCULAR | Status: AC
  Administered 2018-08-21: 18 mL via PERINEURAL
  Filled 2018-08-21: qty 20

## 2018-08-21 NOTE — Progress Notes (Signed)
Safety precautions to be maintained throughout the outpatient stay will include: orient to surroundings, keep bed in low position, maintain call bell within reach at all times, provide assistance with transfer out of bed and ambulation.  

## 2018-08-21 NOTE — Progress Notes (Signed)
Patient's Name: Lisa Newton  MRN: [id number]  Referring Provider: Doreene Eland, MD  DOB: [birth year]  PCP: Doreene Eland, MD  DOS: 08/21/2018  Note by: Oswaldo Done, MD  Service setting: Ambulatory outpatient  Specialty: Interventional Pain Management  Patient type: Established  Location: [facility] (AMB) Pain Management Facility  Visit type: Interventional Procedure   Primary Reason for Visit: Interventional Pain Management Treatment. CC: Back Pain (lower)  Procedure:          Anesthesia, Analgesia, Anxiolysis:  Type: Lumbar Facet, Medial Branch Block(s)          Primary Purpose: Diagnostic Region: Posterolateral Lumbosacral Spine Level: L2, L3, L4, L5, & S1 Medial Branch Level(s). Injecting these levels blocks the L3-4, L4-5, and L5-S1 lumbar facet joints. Laterality: Bilateral  Type: Moderate (Conscious) Sedation combined with Local Anesthesia Indication(s): Analgesia and Anxiety Route: Intravenous (IV) IV Access: Secured Sedation: Meaningful verbal contact was maintained at all times during the procedure  Local Anesthetic: Lidocaine 1-2%   Indications: 1. Spondylosis without myelopathy or radiculopathy, lumbosacral region   2. Lumbar facet syndrome (Bilateral)   3. Primary osteoarthritis of lumbar spine   4. Chronic low back pain (Primary Area of Pain) (Bilateral) (R>L) w/ sciatica (Right)    Pain Score: Pre-procedure: 8 /10 Post-procedure: 7 /10  Pre-op Assessment:  Lisa Newton is a 47 y.o. (year old), female patient, seen today for interventional treatment. She  has a past surgical history that includes left ear surgery; Posterior cervical fusion/foraminotomy (N/A, 03/14/2017); cholesterol granuloma; and Lumbar laminectomy/decompression microdiscectomy (Right, 12/14/2017). Lisa Newton has a current medication list which includes the following prescription(s): vitamin d3, clotrimazole, cyclobenzaprine, docusate sodium, escitalopram, hydrochlorothiazide, lidocaine,  magnesium, pregabalin, tramadol, and gnp calcium 1200, and the following Facility-Administered Medications: fentanyl and midazolam. Her primarily concern today is the Back Pain (lower)  Initial Vital Signs:  Pulse/HCG Rate: 79ECG Heart Rate: 78 Temp: 98.9 F (37.2 C) Resp: 15 BP: 140/89 SpO2: 100 %  BMI: Estimated body mass index is 35.19 kg/m as calculated from the following:   Height as of this encounter: 5\' 6"  (1.676 m).   Weight as of this encounter: 218 lb (98.9 kg).  Risk Assessment: Allergies: Reviewed. She is allergic to augmentin [amoxicillin-pot clavulanate]; bactrim [sulfamethoxazole-trimethoprim]; latex; other; pollen extract; and tape.  Allergy Precautions: None required Coagulopathies: Reviewed. None identified.  Blood-thinner therapy: None at this time Active Infection(s): Reviewed. None identified. Lisa Newton is afebrile  Site Confirmation: Lisa Newton was asked to confirm the procedure and laterality before marking the site Procedure checklist: Completed Consent: Before the procedure and under the influence of no sedative(s), amnesic(s), or anxiolytics, the patient was informed of the treatment options, risks and possible complications. To fulfill our ethical and legal obligations, as recommended by the American Medical Association's Code of Ethics, I have informed the patient of my clinical impression; the nature and purpose of the treatment or procedure; the risks, benefits, and possible complications of the intervention; the alternatives, including doing nothing; the risk(s) and benefit(s) of the alternative treatment(s) or procedure(s); and the risk(s) and benefit(s) of doing nothing. The patient was provided information about the general risks and possible complications associated with the procedure. These may include, but are not limited to: failure to achieve desired goals, infection, bleeding, organ or nerve damage, allergic reactions, paralysis, and death. In  addition, the patient was informed of those risks and complications associated to Spine-related procedures, such as failure to decrease pain; infection (i.e.: Meningitis, epidural or intraspinal abscess); bleeding (  i.e.: epidural hematoma, subarachnoid hemorrhage, or any other type of intraspinal or peri-dural bleeding); organ or nerve damage (i.e.: Any type of peripheral nerve, nerve root, or spinal cord injury) with subsequent damage to sensory, motor, and/or autonomic systems, resulting in permanent pain, numbness, and/or weakness of one or several areas of the body; allergic reactions; (i.e.: anaphylactic reaction); and/or death. Furthermore, the patient was informed of those risks and complications associated with the medications. These include, but are not limited to: allergic reactions (i.e.: anaphylactic or anaphylactoid reaction(s)); adrenal axis suppression; blood sugar elevation that in diabetics may result in ketoacidosis or comma; water retention that in patients with history of congestive heart failure may result in shortness of breath, pulmonary edema, and decompensation with resultant heart failure; weight gain; swelling or edema; medication-induced neural toxicity; particulate matter embolism and blood vessel occlusion with resultant organ, and/or nervous system infarction; and/or aseptic necrosis of one or more joints. Finally, the patient was informed that Medicine is not an exact science; therefore, there is also the possibility of unforeseen or unpredictable risks and/or possible complications that may result in a catastrophic outcome. The patient indicated having understood very clearly. We have given the patient no guarantees and we have made no promises. Enough time was given to the patient to ask questions, all of which were answered to the patient's satisfaction. Lisa Newton has indicated that she wanted to continue with the procedure. Attestation: I, the ordering provider, attest that  I have discussed with the patient the benefits, risks, side-effects, alternatives, likelihood of achieving goals, and potential problems during recovery for the procedure that I have provided informed consent. Date  Time: 08/21/2018  9:28 AM  Pre-Procedure Preparation:  Monitoring: As per clinic protocol. Respiration, ETCO2, SpO2, BP, heart rate and rhythm monitor placed and checked for adequate function Safety Precautions: Patient was assessed for positional comfort and pressure points before starting the procedure. Time-out: I initiated and conducted the "Time-out" before starting the procedure, as per protocol. The patient was asked to participate by confirming the accuracy of the "Time Out" information. Verification of the correct person, site, and procedure were performed and confirmed by me, the nursing staff, and the patient. "Time-out" conducted as per Joint Commission's Universal Protocol (UP.01.01.01). Time: 1002  Description of Procedure:          Position: Prone Laterality: Bilateral. The procedure was performed in identical fashion on both sides. Levels:  L2, L3, L4, L5, & S1 Medial Branch Level(s) Area Prepped: Posterior Lumbosacral Region Prepping solution: ChloraPrep (2% chlorhexidine gluconate and 70% isopropyl alcohol) Safety Precautions: Aspiration looking for blood return was conducted prior to all injections. At no point did we inject any substances, as a needle was being advanced. Before injecting, the patient was told to immediately notify me if she was experiencing any new onset of "ringing in the ears, or metallic taste in the mouth". No attempts were made at seeking any paresthesias. Safe injection practices and needle disposal techniques used. Medications properly checked for expiration dates. SDV (single dose vial) medications used. After the completion of the procedure, all disposable equipment used was discarded in the proper designated medical waste containers. Local  Anesthesia: Protocol guidelines were followed. The patient was positioned over the fluoroscopy table. The area was prepped in the usual manner. The time-out was completed. The target area was identified using fluoroscopy. A 12-in long, straight, sterile hemostat was used with fluoroscopic guidance to locate the targets for each level blocked. Once located, the skin was marked  with an approved surgical skin marker. Once all sites were marked, the skin (epidermis, dermis, and hypodermis), as well as deeper tissues (fat, connective tissue and muscle) were infiltrated with a small amount of a short-acting local anesthetic, loaded on a 10cc syringe with a 25G, 1.5-in  Needle. An appropriate amount of time was allowed for local anesthetics to take effect before proceeding to the next step. Local Anesthetic: Lidocaine 2.0% The unused portion of the local anesthetic was discarded in the proper designated containers. Technical explanation of process:  L2 Medial Branch Nerve Block (MBB): The target area for the L2 medial branch is at the junction of the postero-lateral aspect of the superior articular process and the superior, posterior, and medial edge of the transverse process of L3. Under fluoroscopic guidance, a Quincke needle was inserted until contact was made with os over the superior postero-lateral aspect of the pedicular shadow (target area). After negative aspiration for blood, 0.5 mL of the nerve block solution was injected without difficulty or complication. The needle was removed intact. L3 Medial Branch Nerve Block (MBB): The target area for the L3 medial branch is at the junction of the postero-lateral aspect of the superior articular process and the superior, posterior, and medial edge of the transverse process of L4. Under fluoroscopic guidance, a Quincke needle was inserted until contact was made with os over the superior postero-lateral aspect of the pedicular shadow (target area). After negative  aspiration for blood, 0.5 mL of the nerve block solution was injected without difficulty or complication. The needle was removed intact. L4 Medial Branch Nerve Block (MBB): The target area for the L4 medial branch is at the junction of the postero-lateral aspect of the superior articular process and the superior, posterior, and medial edge of the transverse process of L5. Under fluoroscopic guidance, a Quincke needle was inserted until contact was made with os over the superior postero-lateral aspect of the pedicular shadow (target area). After negative aspiration for blood, 0.5 mL of the nerve block solution was injected without difficulty or complication. The needle was removed intact. L5 Medial Branch Nerve Block (MBB): The target area for the L5 medial branch is at the junction of the postero-lateral aspect of the superior articular process and the superior, posterior, and medial edge of the sacral ala. Under fluoroscopic guidance, a Quincke needle was inserted until contact was made with os over the superior postero-lateral aspect of the pedicular shadow (target area). After negative aspiration for blood, 0.5 mL of the nerve block solution was injected without difficulty or complication. The needle was removed intact. S1 Medial Branch Nerve Block (MBB): The target area for the S1 medial branch is at the posterior and inferior 6 o'clock position of the L5-S1 facet joint. Under fluoroscopic guidance, the Quincke needle inserted for the L5 MBB was redirected until contact was made with os over the inferior and postero aspect of the sacrum, at the 6 o' clock position under the L5-S1 facet joint (Target area). After negative aspiration for blood, 0.5 mL of the nerve block solution was injected without difficulty or complication. The needle was removed intact. Procedural Needles: 22-gauge, 3.5-inch, Quincke needles used for all levels. Nerve block solution: 0.2% PF-Ropivacaine + Triamcinolone (40 mg/mL) diluted  to a final concentration of 4 mg of Triamcinolone/mL of Ropivacaine The unused portion of the solution was discarded in the proper designated containers.  Once the entire procedure was completed, the treated area was cleaned, making sure to leave some of the prepping solution  back to take advantage of its long term bactericidal properties.   Illustration of the posterior view of the lumbar spine and the posterior neural structures. Laminae of L2 through S1 are labeled. DPRL5, dorsal primary ramus of L5; DPRS1, dorsal primary ramus of S1; DPR3, dorsal primary ramus of L3; FJ, facet (zygapophyseal) joint L3-L4; I, inferior articular process of L4; LB1, lateral branch of dorsal primary ramus of L1; IAB, inferior articular branches from L3 medial branch (supplies L4-L5 facet joint); IBP, intermediate branch plexus; MB3, medial branch of dorsal primary ramus of L3; NR3, third lumbar nerve root; S, superior articular process of L5; SAB, superior articular branches from L4 (supplies L4-5 facet joint also); TP3, transverse process of L3.  Vitals:   08/21/18 1024 08/21/18 1035 08/21/18 1044 08/21/18 1053  BP: (!) 157/94 (!) 162/110 (!) 168/94 (!) 150/95  Pulse:      Resp: 18 16 15 13   Temp:      TempSrc:      SpO2: 100% 100% 100% 100%  Weight:      Height:        Start Time: 1002 hrs. End Time: 1012 hrs.  Imaging Guidance (Spinal):          Type of Imaging Technique: Fluoroscopy Guidance (Spinal) Indication(s): Assistance in needle guidance and placement for procedures requiring needle placement in or near specific anatomical locations not easily accessible without such assistance. Exposure Time: Please see nurses notes. Contrast: None used. Fluoroscopic Guidance: I was personally present during the use of fluoroscopy. "Tunnel Vision Technique" used to obtain the best possible view of the target area. Parallax error corrected before commencing the procedure. "Direction-depth-direction" technique  used to introduce the needle under continuous pulsed fluoroscopy. Once target was reached, antero-posterior, oblique, and lateral fluoroscopic projection used confirm needle placement in all planes. Images permanently stored in EMR. Interpretation: No contrast injected. I personally interpreted the imaging intraoperatively. Adequate needle placement confirmed in multiple planes. Permanent images saved into the patient's record.  Antibiotic Prophylaxis:   Anti-infectives (From admission, onward)   None     Indication(s): None identified  Post-operative Assessment:  Post-procedure Vital Signs:  Pulse/HCG Rate: 7992 Temp: 98.9 F (37.2 C) Resp: 13 BP: (!) 150/95 SpO2: 100 %  EBL: None  Complications: No immediate post-treatment complications observed by team, or reported by patient.  Note: The patient tolerated the entire procedure well. A repeat set of vitals were taken after the procedure and the patient was kept under observation following institutional policy, for this type of procedure. Post-procedural neurological assessment was performed, showing return to baseline, prior to discharge. The patient was provided with post-procedure discharge instructions, including a section on how to identify potential problems. Should any problems arise concerning this procedure, the patient was given instructions to immediately contact us, at any time, without hesitation. In any case, we plan to contact the patient by telephone for a follow-up status report regarding this interventional procedure.  Comments:  No additional relevant information.  Plan of Care    Imaging Orders     DG C-Arm 1-60 Min-No Report  Procedure Orders     LUMBAR FACET(MEDIAL BRANCH NERVE BLOCK) MBNB  Medications ordered for procedure: Meds ordered this encounter  Medications  . lidocaine (XYLOCAINE) 2 % (with pres) injection 400 mg  . midazolam (VERSED) 5 MG/5ML injection 1-2 mg    Make sure Flumazenil is  available in the pyxis when using this medication. If oversedation occurs, administer 0.2 mg IV over 15 sec. If  after 45 sec no response, administer 0.2 mg again over 1 min; may repeat at 1 min intervals; not to exceed 4 doses (1 mg)  . fentaNYL (SUBLIMAZE) injection 25-50 mcg    Make sure Narcan is available in the pyxis when using this medication. In the event of respiratory depression (RR< 8/min): Titrate NARCAN (naloxone) in increments of 0.1 to 0.2 mg IV at 2-3 minute intervals, until desired degree of reversal.  . lactated ringers infusion 1,000 mL  . ropivacaine (PF) 2 mg/mL (0.2%) (NAROPIN) injection 18 mL  . triamcinolone acetonide (KENALOG-40) injection 80 mg   Medications administered: We administered lidocaine, midazolam, fentaNYL, lactated ringers, ropivacaine (PF) 2 mg/mL (0.2%), and triamcinolone acetonide.  See the medical record for exact dosing, route, and time of administration.  New Prescriptions   No medications on file   Disposition: Discharge home  Discharge Date & Time: 08/21/2018; 1044 hrs.   Physician-requested Follow-up: Return for post-procedure eval (2 wks), w/ Dr. Laban Emperor.  Future Appointments  Date Time Provider Department Center  08/29/2018  9:30 AM [facility]  09/03/2018  1:30 PM Delano Metz, MD [facility] None  09/24/2018  9:30 AM Delorse Lek, Pilar Grammes, MD [facility] None   Primary Care Physician: Doreene Eland, MD Location: [facility] Note by: Oswaldo Done, MD Date: 08/21/2018; Time: 11:03 AM  Disclaimer:  Medicine is not an exact science. The only guarantee in medicine is that nothing is guaranteed. It is important to note that the decision to proceed with this intervention was based on the information collected from the patient. The Data and conclusions were drawn from the patient's questionnaire, the interview, and the physical examination. Because the information was  provided in large part by the patient, it cannot be guaranteed that it has not been purposely or unconsciously manipulated. Every effort has been made to obtain as much relevant data as possible for this evaluation. It is important to note that the conclusions that lead to this procedure are derived in large part from the available data. Always take into account that the treatment will also be dependent on availability of resources and existing treatment guidelines, considered by other Pain Management Practitioners as being common knowledge and practice, at the time of the intervention. For Medico-Legal purposes, it is also important to point out that variation in procedural techniques and pharmacological choices are the acceptable norm. The indications, contraindications, technique, and results of the above procedure should only be interpreted and judged by a Board-Certified Interventional Pain Specialist with extensive familiarity and expertise in the same exact procedure and technique.

## 2018-08-21 NOTE — Patient Instructions (Addendum)
____________________________________________________________________________________________  Post-Procedure Discharge Instructions  Instructions:  Apply ice: Fill a plastic sandwich bag with crushed ice. Cover it with a small towel and apply to injection site. Apply for 15 minutes then remove x 15 minutes. Repeat sequence on day of procedure, until you go to bed. The purpose is to minimize swelling and discomfort after procedure.  Apply heat: Apply heat to procedure site starting the day following the procedure. The purpose is to treat any soreness and discomfort from the procedure.  Food intake: Start with clear liquids (like water) and advance to regular food, as tolerated.   Physical activities: Keep activities to a minimum for the first 8 hours after the procedure.   Driving: If you have received any sedation, you are not allowed to drive for 24 hours after your procedure.  Blood thinner: Restart your blood thinner 6 hours after your procedure. (Only for those taking blood thinners)  Insulin: As soon as you can eat, you may resume your normal dosing schedule. (Only for those taking insulin)  Infection prevention: Keep procedure site clean and dry.  Post-procedure Pain Diary: Extremely important that this be done correctly and accurately. Recorded information will be used to determine the next step in treatment.  Pain evaluated is that of treated area only. Do not include pain from an untreated area.  Complete every hour, on the hour, for the initial 8 hours. Set an alarm to help you do this part accurately.  Do not go to sleep and have it completed later. It will not be accurate.  Follow-up appointment: Keep your follow-up appointment after the procedure. Usually 2 weeks for most procedures. (6 weeks in the case of radiofrequency.) Bring you pain diary.   Expect:  From numbing medicine (AKA: Local Anesthetics): Numbness or decrease in pain.  Onset: Full effect within 15  minutes of injected.  Duration: It will depend on the type of local anesthetic used. On the average, 1 to 8 hours.   From steroids: Decrease in swelling or inflammation. Once inflammation is improved, relief of the pain will follow.  Onset of benefits: Depends on the amount of swelling present. The more swelling, the longer it will take for the benefits to be seen. In some cases, up to 10 days.  Duration: Steroids will stay in the system x 2 weeks. Duration of benefits will depend on multiple posibilities including persistent irritating factors.  Occasional side-effects: Facial flushing, cramps (if present, drink Gatorade and take over-the-counter Magnesium 450-500 mg once to twice a day).  From procedure: Some discomfort is to be expected once the numbing medicine wears off. This should be minimal if ice and heat are applied as instructed.  Call if:  You experience numbness and weakness that gets worse with time, as opposed to wearing off.  New onset bowel or bladder incontinence. (This applies to Spinal procedures only)  Emergency Numbers:  Durning business hours (Monday - Thursday, 8:00 AM - 4:00 PM) (Friday, 9:00 AM - 12:00 Noon): [phone]  After hours: [phone] ____________________________________________________________________________________________   Pain Management Discharge Instructions  General Discharge Instructions :  If you need to reach your doctor call: Monday-Friday 8:00 am - 4:00 pm at [phone] or toll free [phone].  After clinic hours [phone] to have operator reach doctor.  Bring all of your medication bottles to all your appointments in the pain clinic.  To cancel or reschedule your appointment with Pain Management please remember to call 24 hours in advance to avoid a fee.  Refer to the educational materials which you have been given on: General Risks, I had my Procedure. Discharge Instructions, Post Sedation.  Post Procedure  Instructions:  The drugs you were given will stay in your system until tomorrow, so for the next 24 hours you should not drive, make any legal decisions or drink any alcoholic beverages.  You may eat anything you prefer, but it is better to start with liquids then soups and crackers, and gradually work up to solid foods.  Please notify your doctor immediately if you have any unusual bleeding, trouble breathing or pain that is not related to your normal pain.  Depending on the type of procedure that was done, some parts of your body may feel week and/or numb.  This usually clears up by tonight or the next day.  Walk with the use of an assistive device or accompanied by an adult for the 24 hours.  You may use ice on the affected area for the first 24 hours.  Put ice in a Ziploc bag and cover with a towel and place against area 15 minutes on 15 minutes off.  You may switch to heat after 24 hours.muscle spasmPain Management Discharge Instructions  General Discharge Instructions :  If you need to reach your doctor call: Monday-Friday 8:00 am - 4:00 pm at [phone] or toll free [phone].  After clinic hours [phone] to have operator reach doctor.  Bring all of your medication bottles to all your appointments in the pain clinic.  To cancel or reschedule your appointment with Pain Management please remember to call 24 hours in advance to avoid a fee.  Refer to the educational materials which you have been given on: General Risks, I had my Procedure. Discharge Instructions, Post Sedation.  Post Procedure Instructions:  The drugs you were given will stay in your system until tomorrow, so for the next 24 hours you should not drive, make any legal decisions or drink any alcoholic beverages.  You may eat anything you prefer, but it is better to start with liquids then soups and crackers, and gradually work up to solid foods.  Please notify your doctor immediately if you have any unusual  bleeding, trouble breathing or pain that is not related to your normal pain.  Depending on the type of procedure that was done, some parts of your body may feel week and/or numb.  This usually clears up by tonight or the next day.  Walk with the use of an assistive device or accompanied by an adult for the 24 hours.  You may use ice on the affected area for the first 24 hours.  Put ice in a Ziploc bag and cover with a towel and place against area 15 minutes on 15 minutes off.  You may switch to heat after 24 hours.

## 2018-08-22 ENCOUNTER — Telehealth: Payer: Self-pay

## 2018-08-22 NOTE — Telephone Encounter (Signed)
She needs to be discussed with Dr Dorris Carnes because she is new and has had a bad experience in the past with injections. She was fearful of having any more procedures. It does not look like she was numb when she left. She entered as an 8/10 and exited as a 7/10. I will be glad to see her if he feels like it is needed.

## 2018-08-22 NOTE — Telephone Encounter (Signed)
Patient had bilateral lumbar facet on yesterday and she states that she is having extreme pain in both of her legs in the back hamstring area.  The pain started yesterday around noon and persist.  Patient states that the pain is the same as before.  When asked if she was drinking plenty of fluids she states that she has been going through this for a long time and she is doing everything she is supposed to do.  States she is taking Tramadol and it does give her some relief but she continues to hurt and is having to stay in bed.

## 2018-08-22 NOTE — Telephone Encounter (Signed)
Attempted to call patient. No answer. Left message on answering machine to call if needed.

## 2018-08-22 NOTE — Telephone Encounter (Signed)
Spoke with Dr. Laban Emperor, advised to allow 2 weeks following a procedure to see decrease in pain. Patient advised of this. Denies any fever, progressive weakness in legs, loss of control of bowel or bladder, or other symptoms of complications.

## 2018-08-22 NOTE — Telephone Encounter (Signed)
Pt had procedure and pain is out of control, could someone call please

## 2018-08-27 ENCOUNTER — Telehealth: Payer: Self-pay | Admitting: Family Medicine

## 2018-08-27 ENCOUNTER — Other Ambulatory Visit: Payer: Self-pay

## 2018-08-27 MED ORDER — IBUPROFEN 600 MG PO TABS: 600.0000 mg | ORAL_TABLET | Freq: Three times a day (TID) | ORAL | 0 refills | Status: DC | PRN

## 2018-08-27 NOTE — Telephone Encounter (Signed)
Form completed and was placed in the RN inbox. Thanks.

## 2018-08-27 NOTE — Telephone Encounter (Signed)
Clinical info completed on assessment form.  Place form in Dr. Phebe CollaEniola's box for completion.  Feliz BeamHARTSELL,  JAZMIN, CMA

## 2018-08-27 NOTE — Telephone Encounter (Signed)
Pt informed.  Copy made for batch scanning.   Fleeger, Maryjo RochesterJessica Dawn, CMA

## 2018-08-27 NOTE — Telephone Encounter (Signed)
Medical Examination form dropped off for work at front desk for completion.  Verified that patient section of form has been completed.  Last DOS/WCC with PCP was 08/14/2018.  Placed form in team folder to be completed by clinical staff.  Lisa Newton

## 2018-08-28 ENCOUNTER — Telehealth: Payer: Self-pay

## 2018-08-28 ENCOUNTER — Other Ambulatory Visit: Payer: Self-pay | Admitting: Family Medicine

## 2018-08-28 DIAGNOSIS — G4489 Other headache syndrome: Secondary | ICD-10-CM | POA: Insufficient documentation

## 2018-08-28 DIAGNOSIS — R519 Headache, unspecified: Secondary | ICD-10-CM

## 2018-08-28 DIAGNOSIS — R51 Headache: Principal | ICD-10-CM

## 2018-08-28 DIAGNOSIS — M542 Cervicalgia: Secondary | ICD-10-CM

## 2018-08-28 NOTE — Progress Notes (Signed)
Patient left message [place] told her to go back to see her neurologist, Dr Terrace ArabiaYan. Needs new referral for her headaches, ear and neck pain.  Call back is [phone]  Ples SpecterAlisa Brake, RN [facility] RN)

## 2018-08-28 NOTE — Telephone Encounter (Signed)
Patient left message that she saw her ENT in [place] and they told her to go back to see her neurologist, Dr Yan. Needs new referral for her headaches, ear and neck pain.  Call back is [phone]  Alisa Brake, RN ([facility] RN) 

## 2018-08-28 NOTE — Telephone Encounter (Signed)
Referral has been completed. Please advise her to go to the ED if headache is persistent or worsening. Thanks.

## 2018-08-29 ENCOUNTER — Ambulatory Visit: Payer: Medicaid Other

## 2018-08-29 ENCOUNTER — Telehealth: Payer: Self-pay | Admitting: Licensed Clinical Social Worker

## 2018-08-29 NOTE — Progress Notes (Signed)
Service : [facility] F/U Call   F/U call to patient reference missed appointment today. Patient was able to completed task discussed during warm handoff. Patient appreciative of F/U call.  Appointment rescheduled.  Plan: Will meet with LCSW in one week.  Sammuel Hineseborah Chantilly Linskey, LCSW Licensed Clinical Social Worker [facility]   [phone] 1:53 PM

## 2018-08-30 ENCOUNTER — Encounter: Payer: Self-pay | Admitting: Family Medicine

## 2018-09-02 NOTE — Progress Notes (Signed)
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   Disc levels: L4-5: The patient is status post right laminotomy for discectomy. There is some loss of disc space height. Fat about the descending right L4 root is obliterated in the subarticular recess. This is likely due to the presence of granulation tissue. No definite mass effect on the root is seen and there is no mass effect on the thecal sac. Foramina are open. L5-S1: Minimal disc bulge and mild facet arthropathy without stenosis, unchanged.  IMPRESSION: Status post right laminotomy at L4-5 for discectomy. There is no fat about the descending right L5 root in the subarticular recess which is likely due to presence of granulation tissue. No definite mass effect on the root is identified. The exam is otherwise negative.   Abnormal MRI, cervical spine (07/25/2018)   Disc levels: C2-3: The disc appears normal. Stable mild bilateral facet hypertrophy. No spinal stenosis or nerve root  encroachment. C3-4: Stable mild uncinate spurring and facet hypertrophy. No spinal stenosis or nerve root encroachment. C4-5: Stable chronic spondylosis with posterior osteophytes covering diffusely bulging disc material. The AP diameter of the canal is chronically narrowed to 8 mm with effacement of the surrounding CSF. There is no significant cord deformity. Stable mild to moderate foraminal narrowing bilaterally. C5-6: Chronic spondylosis with posterior osteophytes covering diffusely bulging disc material. Stable effacement of the CSF surrounding the cord and narrowing of the AP diameter of the canal to 9 mm. No significant cord deformity. Stable moderate foraminal narrowing bilaterally. C6-7: Postoperative changes on the left status post laminectomy and discectomy. There is a stable residual posterolateral disc protrusion on the left with stable mild mass effect on the thecal sac. No cord deformity or significant foraminal compromise. C7-T1: The disc appears normal. Mild bilateral facet hypertrophy. No spinal stenosis or nerve root encroachment.  IMPRESSION: 1. No acute findings demonstrated within the cervical spine. 2. 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 3. Stable postsurgical changes at C6-7 and residual small posterolateral disc protrusion on the left. 4. 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   Other Headache Syndrome  Headache  Chronic low back pain (Secondary Area of Pain) (Bilateral) (R>L) w/ sciatica (Right)  Neurogenic Pain  Chronic Musculoskeletal Pain  Chronic lower extremity pain (1) (B) (R>L)  History of "Allergic reaction" to injection   Headaches and increased back pain with injection done at [facility] (Epidural) (Before she had her back surgery).   Morbid Obesity (Hcc)  Constipation   Time Note: Greater than 50% of the  40 minute(s) of face-to-face time spent with Ms. Steadham, was spent in counseling/coordination of care regarding: the appropriate use of the pain scale, Ms. Bradway's primary cause of pain, the results of her recent test(s), the significance of each one oth the test(s) anomalies and it's corresponding characteristic pain pattern(s), the treatment plan, treatment alternatives, medication side effects, the results, interpretation and significance of  her recent diagnostic interventional treatment(s), the appropriate use of her medications, realistic expectations, the goals of pain management (increased  in functionality), the importance of providing [place] with accurate post-procedure information and the need to collect and read the AVS material. Plan of Care  Pharmacotherapy (Medications Ordered): Meds ordered this encounter  Medications  . cyclobenzaprine (FLEXERIL) 10 MG tablet    Sig: Take 1 tablet (10 mg total) by mouth 3 (three) times daily as needed for muscle spasms.    Dispense:  90 tablet    Refill:  0    Do not place medication on "Automatic Refill". Fill one day early if pharmacy is closed on scheduled refill date.  . traMADol (ULTRAM) 50 MG tablet    Sig: Take 1 tablet (50 mg total) by mouth every 6 (six) hours as needed for severe pain.    Dispense:  120 tablet    Refill:  0    Medication for Chronic Pain (G89.4). [place] STOP ACT - Not applicable. Fill one day early if pharmacy is closed on scheduled refill date. Do not fill until: 09/05/18. To last until: 10/05/18  . pregabalin (LYRICA) 150 MG capsule    Sig: Take 1 capsule (150 mg total) by mouth 3 (three) times daily.    Dispense:  90 capsule    Refill:  0    Do not place medication on "Automatic Refill". Fill one day early if pharmacy is closed on scheduled refill date.  . orphenadrine (NORFLEX) injection 60 mg  . ketorolac (TORADOL) injection 60 mg   Medications administered today: We administered orphenadrine and  ketorolac.   Procedure Orders     Caudal Epidural Injection Lab Orders  No laboratory test(s) ordered today   Imaging Orders  No imaging studies ordered today   Referral Orders  No referral(s) requested today   Interventional management options: Planned, scheduled, and/or pending:   Diagnostic caudal ESI + diagnostic epidurogram #1  under fluoroscopic guidance and IV sedation   Considering:   Diagnostic caudal ESI + diagnostic epidurogram #1  Possible Racz procedure  Diagnostic left-sided L5-S1 lumbar ESI  Diagnostic left sided L5 transforaminal ESI  Diagnostic bilateral lumbar facet nerve block  Possible bilateral lumbar facet RFA  Diagnostic bilateral sacroiliac joint block  Possible bilateral sacroiliac joint RFA  Diagnostic left cervical facet block  Possible left cervical facet RFA    Palliative PRN treatment(s):   None at this time   Provider-requested follow-up: Return for Procedure (w/ sedation): (ML) Caudal ESI #1 + Epidurogram.  Future Appointments  Date Time Provider [place]  09/05/2018  9:30 AM [place] [facility]  09/13/2018  8:30 AM Milinda Pointer, MD [facility] None  10/03/2018  9:00 AM Padgett, Rae Halsted, MD [facility] None   Primary Care Physician: Kinnie Feil, MD Location: [facility] Note by: Gaspar Cola, MD Date: 09/03/2018; Time: 10:39 AM

## 2018-09-03 ENCOUNTER — Other Ambulatory Visit: Payer: Self-pay

## 2018-09-03 ENCOUNTER — Encounter: Payer: Self-pay | Admitting: Pain Medicine

## 2018-09-03 ENCOUNTER — Ambulatory Visit: Payer: Medicaid Other | Attending: Pain Medicine | Admitting: Pain Medicine

## 2018-09-03 VITALS — BP 157/102 | HR 73 | Temp 98.6°F | Resp 18 | Ht 66.0 in | Wt 206.0 lb

## 2018-09-03 DIAGNOSIS — M47816 Spondylosis without myelopathy or radiculopathy, lumbar region: Secondary | ICD-10-CM | POA: Diagnosis not present

## 2018-09-03 DIAGNOSIS — M542 Cervicalgia: Secondary | ICD-10-CM | POA: Diagnosis not present

## 2018-09-03 DIAGNOSIS — Z09 Encounter for follow-up examination after completed treatment for conditions other than malignant neoplasm: Secondary | ICD-10-CM | POA: Diagnosis present

## 2018-09-03 DIAGNOSIS — G894 Chronic pain syndrome: Secondary | ICD-10-CM | POA: Diagnosis not present

## 2018-09-03 DIAGNOSIS — R937 Abnormal findings on diagnostic imaging of other parts of musculoskeletal system: Secondary | ICD-10-CM

## 2018-09-03 DIAGNOSIS — G8929 Other chronic pain: Secondary | ICD-10-CM

## 2018-09-03 DIAGNOSIS — M961 Postlaminectomy syndrome, not elsewhere classified: Secondary | ICD-10-CM

## 2018-09-03 DIAGNOSIS — M5441 Lumbago with sciatica, right side: Secondary | ICD-10-CM

## 2018-09-03 DIAGNOSIS — M533 Sacrococcygeal disorders, not elsewhere classified: Secondary | ICD-10-CM

## 2018-09-03 DIAGNOSIS — T450X5S Adverse effect of antiallergic and antiemetic drugs, sequela: Secondary | ICD-10-CM

## 2018-09-03 DIAGNOSIS — T8089XS Other complications following infusion, transfusion and therapeutic injection, sequela: Secondary | ICD-10-CM

## 2018-09-03 DIAGNOSIS — M7918 Myalgia, other site: Secondary | ICD-10-CM

## 2018-09-03 DIAGNOSIS — M792 Neuralgia and neuritis, unspecified: Secondary | ICD-10-CM

## 2018-09-03 HISTORY — DX: Abnormal findings on diagnostic imaging of other parts of musculoskeletal system: R93.7

## 2018-09-03 MED ORDER — TRAMADOL HCL 50 MG PO TABS: 50.0000 mg | ORAL_TABLET | Freq: Four times a day (QID) | ORAL | 0 refills | Status: DC | PRN

## 2018-09-03 MED ORDER — KETOROLAC TROMETHAMINE 60 MG/2ML IM SOLN
INTRAMUSCULAR | Status: AC
  Filled 2018-09-03: qty 2

## 2018-09-03 MED ORDER — ORPHENADRINE CITRATE 30 MG/ML IJ SOLN
INTRAMUSCULAR | Status: AC
  Filled 2018-09-03: qty 2

## 2018-09-03 MED ORDER — CYCLOBENZAPRINE HCL 10 MG PO TABS: 10.0000 mg | ORAL_TABLET | Freq: Three times a day (TID) | ORAL | 0 refills | Status: DC | PRN

## 2018-09-03 MED ORDER — PREGABALIN 150 MG PO CAPS: 150.0000 mg | ORAL_CAPSULE | Freq: Three times a day (TID) | ORAL | 0 refills | Status: DC

## 2018-09-03 MED ORDER — KETOROLAC TROMETHAMINE 60 MG/2ML IM SOLN
60.0000 mg | Freq: Once | INTRAMUSCULAR | Status: AC
  Administered 2018-09-03: 60 mg via INTRAMUSCULAR

## 2018-09-03 MED ORDER — ORPHENADRINE CITRATE 30 MG/ML IJ SOLN
60.0000 mg | Freq: Once | INTRAMUSCULAR | Status: AC
  Administered 2018-09-03: 60 mg via INTRAMUSCULAR

## 2018-09-03 NOTE — Patient Instructions (Addendum)
____________________________________________________________________________________________  Pain Scale  Introduction: The pain score used by this practice is the Verbal Numerical Rating Scale (VNRS-11). This is an 11-point scale. It is for adults and children 10 years or older. There are significant differences in how the pain score is reported, used, and applied. Forget everything you learned in the past and learn this scoring system.  General Information: The scale should reflect your current level of pain. Unless you are specifically asked for the level of your worst pain, or your average pain. If you are asked for one of these two, then it should be understood that it is over the past 24 hours.  Basic Activities of Daily Living (ADL): Personal hygiene, dressing, eating, transferring, and using restroom.  Instructions: Most patients tend to report their level of pain as a combination of two factors, their physical pain and their psychosocial pain. This last one is also known as "suffering" and it is reflection of how physical pain affects you socially and psychologically. From now on, report them separately. From this point on, when asked to report your pain level, report only your physical pain. Use the following table for reference.  Pain Clinic Pain Levels (0-5/10)  Pain Level Score  Description  No Pain 0   Mild pain 1 Nagging, annoying, but does not interfere with basic activities of daily living (ADL). Patients are able to eat, bathe, get dressed, toileting (being able to get on and off the toilet and perform personal hygiene functions), transfer (move in and out of bed or a chair without assistance), and maintain continence (able to control bladder and bowel functions). Blood pressure and heart rate are unaffected. A normal heart rate for a healthy adult ranges from 60 to 100 bpm (beats per minute).   Mild to moderate pain 2 Noticeable and distracting. Impossible to hide from other  people. More frequent flare-ups. Still possible to adapt and function close to normal. It can be very annoying and may have occasional stronger flare-ups. With discipline, patients may get used to it and adapt.   Moderate pain 3 Interferes significantly with activities of daily living (ADL). It becomes difficult to feed, bathe, get dressed, get on and off the toilet or to perform personal hygiene functions. Difficult to get in and out of bed or a chair without assistance. Very distracting. With effort, it can be ignored when deeply involved in activities.   Moderately severe pain 4 Impossible to ignore for more than a few minutes. With effort, patients may still be able to manage work or participate in some social activities. Very difficult to concentrate. Signs of autonomic nervous system discharge are evident: dilated pupils (mydriasis); mild sweating (diaphoresis); sleep interference. Heart rate becomes elevated (>115 bpm). Diastolic blood pressure (lower number) rises above 100 mmHg. Patients find relief in laying down and not moving.   Severe pain 5 Intense and extremely unpleasant. Associated with frowning face and frequent crying. Pain overwhelms the senses.  Ability to do any activity or maintain social relationships becomes significantly limited. Conversation becomes difficult. Pacing back and forth is common, as getting into a comfortable position is nearly impossible. Pain wakes you up from deep sleep. Physical signs will be obvious: pupillary dilation; increased sweating; goosebumps; brisk reflexes; cold, clammy hands and feet; nausea, vomiting or dry heaves; loss of appetite; significant sleep disturbance with inability to fall asleep or to remain asleep. When persistent, significant weight loss is observed due to the complete loss of appetite and sleep deprivation.  Blood   pressure and heart rate becomes significantly elevated. Caution: If elevated blood pressure triggers a pounding headache  associated with blurred vision, then the patient should immediately seek attention at an urgent or emergency care unit, as these may be signs of an impending stroke.    Emergency Department Pain Levels (6-10/10)  Emergency Room Pain 6 Severely limiting. Requires emergency care and should not be seen or managed at an outpatient pain management facility. Communication becomes difficult and requires great effort. Assistance to reach the emergency department may be required. Facial flushing and profuse sweating along with potentially dangerous increases in heart rate and blood pressure will be evident.   Distressing pain 7 Self-care is very difficult. Assistance is required to transport, or use restroom. Assistance to reach the emergency department will be required. Tasks requiring coordination, such as bathing and getting dressed become very difficult.   Disabling pain 8 Self-care is no longer possible. At this level, pain is disabling. The individual is unable to do even the most "basic" activities such as walking, eating, bathing, dressing, transferring to a bed, or toileting. Fine motor skills are lost. It is difficult to think clearly.   Incapacitating pain 9 Pain becomes incapacitating. Thought processing is no longer possible. Difficult to remember your own name. Control of movement and coordination are lost.   The worst pain imaginable 10 At this level, most patients pass out from pain. When this level is reached, collapse of the autonomic nervous system occurs, leading to a sudden drop in blood pressure and heart rate. This in turn results in a temporary and dramatic drop in blood flow to the brain, leading to a loss of consciousness. Fainting is one of the body's self defense mechanisms. Passing out puts the brain in a calmed state and causes it to shut down for a while, in order to begin the healing process.    Summary: 1. Refer to this scale when providing us with your pain level. 2. Be  accurate and careful when reporting your pain level. This will help with your care. 3. Over-reporting your pain level will lead to loss of credibility. 4. Even a level of 1/10 means that there is pain and will be treated at our facility. 5. High, inaccurate reporting will be documented as "Symptom Exaggeration", leading to loss of credibility and suspicions of possible secondary gains such as obtaining more narcotics, or wanting to appear disabled, for fraudulent reasons. 6. Only pain levels of 5 or below will be seen at our facility. 7. Pain levels of 6 and above will be sent to the Emergency Department and the appointment cancelled. ____________________________________________________________________________________________   ____________________________________________________________________________________________  Preparing for Procedure with Sedation  Instructions: . Oral Intake: Do not eat or drink anything for at least 8 hours prior to your procedure. . Transportation: Public transportation is not allowed. Bring an adult driver. The driver must be physically present in our waiting room before any procedure can be started. . Physical Assistance: Bring an adult physically capable of assisting you, in the event you need help. This adult should keep you company at home for at least 6 hours after the procedure. . Blood Pressure Medicine: Take your blood pressure medicine with a sip of water the morning of the procedure. . Blood thinners: Notify our staff if you are taking any blood thinners. Depending on which one you take, there will be specific instructions on how and when to stop it. . Diabetics on insulin: Notify the staff so that you can be   scheduled 1st case in the morning. If your diabetes requires high dose insulin, take only  of your normal insulin dose the morning of the procedure and notify the staff that you have done so. . Preventing infections: Shower with an antibacterial soap  the morning of your procedure. . Build-up your immune system: Take 1000 mg of Vitamin C with every meal (3 times a day) the day prior to your procedure. [name] Antibiotics: Inform the staff if you have a condition or reason that requires you to take antibiotics before dental procedures. . Pregnancy: If you are pregnant, call and cancel the procedure. . Sickness: If you have a cold, fever, or any active infections, call and cancel the procedure. . Arrival: You must be in the facility at least 30 minutes prior to your scheduled procedure. . Children: Do not bring children with you. . Dress appropriately: Bring dark clothing that you would not mind if they get stained. . Valuables: Do not bring any jewelry or valuables.  Procedure appointments are reserved for interventional treatments only. [name] No Prescription Refills. . No medication changes will be discussed during procedure appointments. . No disability issues will be discussed.  Reasons to call and reschedule or cancel your procedure: (Following these recommendations will minimize the risk of a serious complication.) . Surgeries: Avoid having procedures within 2 weeks of any surgery. (Avoid for 2 weeks before or after any surgery). . Flu Shots: Avoid having procedures within 2 weeks of a flu shots or . (Avoid for 2 weeks before or after immunizations). . Barium: Avoid having a procedure within 7-10 days after having had a radiological study involving the use of radiological contrast. (Myelograms, Barium swallow or enema study). . Heart attacks: Avoid any elective procedures or surgeries for the initial 6 months after a "Myocardial Infarction" (Heart Attack). . Blood thinners: It is imperative that you stop these medications before procedures. Let us know if you if you take any blood thinner.  . Infection: Avoid procedures during or within two weeks of an infection (including chest colds or gastrointestinal problems). Symptoms associated with  infections include: Localized redness, fever, chills, night sweats or profuse sweating, burning sensation when voiding, cough, congestion, stuffiness, runny nose, sore throat, diarrhea, nausea, vomiting, cold or Flu symptoms, recent or current infections. It is specially important if the infection is over the area that we intend to treat. [name] Heart and lung problems: Symptoms that may suggest an active cardiopulmonary problem include: cough, chest pain, breathing difficulties or shortness of breath, dizziness, ankle swelling, uncontrolled high or unusually low blood pressure, and/or palpitations. If you are experiencing any of these symptoms, cancel your procedure and contact your primary care physician for an evaluation.  Remember:  Regular Business hours are:  Monday to Thursday 8:00 AM to 4:00 PM  Provider's Schedule: Milinda Pointer, MD:  Procedure days: Tuesday and Thursday 7:30 AM to 4:00 PM  Gillis Santa, MD:  Procedure days: Monday and Wednesday 7:30 AM to 4:00 PM ____________________________________________________________________________________________   Pain Management Discharge Instructions  General Discharge Instructions :  If you need to reach your doctor call: Monday-Friday 8:00 am - 4:00 pm at [phone] or toll free [phone].  After clinic hours [phone] to have operator reach doctor.  Bring all of your medication bottles to all your appointments in the pain clinic.  To cancel or reschedule your appointment with Pain Management please remember to call 24 hours in advance to avoid a fee.  Refer to the educational materials which you have  been given on: General Risks, I had my Procedure. Discharge Instructions, Post Sedation.  Post Procedure Instructions:  The drugs you were given will stay in your system until tomorrow, so for the next 24 hours you should not drive, make any legal decisions or drink any alcoholic beverages.  You may eat anything you  prefer, but it is better to start with liquids then soups and crackers, and gradually work up to solid foods.  Please notify your doctor immediately if you have any unusual bleeding, trouble breathing or pain that is not related to your normal pain.  Depending on the type of procedure that was done, some parts of your body may feel week and/or numb.  This usually clears up by tonight or the next day.  Walk with the use of an assistive device or accompanied by an adult for the 24 hours.  You may use ice on the affected area for the first 24 hours.  Put ice in a Ziploc bag and cover with a towel and place against area 15 minutes on 15 minutes off.  You may switch to heat after 24 hours.Epidural Steroid Injection Patient Information  Description: The epidural space surrounds the nerves as they exit the spinal cord.  In some patients, the nerves can be compressed and inflamed by a bulging disc or a tight spinal canal (spinal stenosis).  By injecting steroids into the epidural space, we can bring irritated nerves into direct contact with a potentially helpful medication.  These steroids act directly on the irritated nerves and can reduce swelling and inflammation which often leads to decreased pain.  Epidural steroids may be injected anywhere along the spine and from the neck to the low back depending upon the location of your pain.   After numbing the skin with local anesthetic (like Novocaine), a small needle is passed into the epidural space slowly.  You may experience a sensation of pressure while this is being done.  The entire block usually last less than 10 minutes.  Conditions which may be treated by epidural steroids:   Low back and leg pain  Neck and arm pain  Spinal stenosis  Post-laminectomy syndrome  Herpes zoster (shingles) pain  Pain from compression fractures  Preparation for the injection:  1. Do not eat any solid food or dairy products within 8 hours of your appointment.   2. You may drink clear liquids up to 3 hours before appointment.  Clear liquids include water, black coffee, juice or soda.  No milk or cream please. 3. You may take your regular medication, including pain medications, with a sip of water before your appointment  Diabetics should hold regular insulin (if taken separately) and take 1/2 normal NPH dos the morning of the procedure.  Carry some sugar containing items with you to your appointment. 4. A driver must accompany you and be prepared to drive you home after your procedure.  5. Bring all your current medications with your. 6. An IV may be inserted and sedation may be given at the discretion of the physician.   7. A blood pressure cuff, EKG and other monitors will often be applied during the procedure.  Some patients may need to have extra oxygen administered for a short period. 8. You will be asked to provide medical information, including your allergies, prior to the procedure.  We must know immediately if you are taking blood thinners (like Coumadin/Warfarin)  Or if you are allergic to IV iodine contrast (dye). We must know  if you could possible be pregnant.  Possible side-effects:  Bleeding from needle site  Infection (rare, may require surgery)  Nerve injury (rare)  Numbness & tingling (temporary)  Difficulty urinating (rare, temporary)  Spinal headache ( a headache worse with upright posture)  Light -headedness (temporary)  Pain at injection site (several days)  Decreased blood pressure (temporary)  Weakness in arm/leg (temporary)  Pressure sensation in back/neck (temporary)  Call if you experience:  Fever/chills associated with headache or increased back/neck pain.  Headache worsened by an upright position.  New onset weakness or numbness of an extremity below the injection site  Hives or difficulty breathing (go to the emergency room)  Inflammation or drainage at the infection site  Severe back/neck  pain  Any new symptoms which are concerning to you  Please note:  Although the local anesthetic injected can often make your back or neck feel good for several hours after the injection, the pain will likely return.  It takes 3-7 days for steroids to work in the epidural space.  You may not notice any pain relief for at least that one week.  If effective, we will often do a series of three injections spaced 3-6 weeks apart to maximally decrease your pain.  After the initial series, we generally will wait several months before considering a repeat injection of the same type.  If you have any questions, please call [phone] [facility] Prescriptions E-scribed to pharmacy listed.

## 2018-09-03 NOTE — Progress Notes (Signed)
Nursing Pain Medication Assessment:  Safety precautions to be maintained throughout the outpatient stay will include: orient to surroundings, keep bed in low position, maintain call bell within reach at all times, provide assistance with transfer out of bed and ambulation.  Medication Inspection Compliance: Pill count conducted under aseptic conditions, in front of the patient. Neither the pills nor the bottle was removed from the patient's sight at any time. Once count was completed pills were immediately returned to the patient in their original bottle.  Medication: Tramadol (Ultram) Pill/Patch Count: 15 of 120 pills remain Pill/Patch Appearance: Markings consistent with prescribed medication Bottle Appearance: Standard pharmacy container. Clearly labeled. Filled Date: 08 / 26 / 2019 Last Medication intake:  Today

## 2018-09-04 ENCOUNTER — Telehealth: Payer: Self-pay | Admitting: Family Medicine

## 2018-09-04 NOTE — Telephone Encounter (Signed)
I am precepting tomorrow morning. I can quickly see her after you check her BP. Thanks for making this work,.

## 2018-09-04 NOTE — Telephone Encounter (Signed)
Will forward to MD to advise. Jazmin Hartsell,CMA  

## 2018-09-04 NOTE — Telephone Encounter (Signed)
Called pt and she is aware of [facility] apt on 9/26. PCP has no apts until 10/11. Pt stated her BP at her "back doctor" on Monday was 157/102 and today at the dentist it was 157/117. Pt stated, "this is how I know its high, I dont have a BP cuff at home."   The nurse schedule is full for tomorrow. Pt has an apt with [facility] here at 930am. I will check her BP then personally.   ED precautions given.

## 2018-09-04 NOTE — Telephone Encounter (Signed)
Pt called to let Dr. Lum BabeEniola know that her blood pressure has been running high for the last two days. She is continuing to take her medication. She would like for Dr. Lum BabeEniola to give her a call to discuss these issues and discuss seeing a neurologist for her headaches. The best contact number is [phone].

## 2018-09-04 NOTE — Telephone Encounter (Signed)
Please let patient know that referral to neurologist was placed a while back. Please help look into referral status. I am unable to adjust BP meds with telephone conversation. Please help her schedule an appointment with me for BP check and medication management. Encourage her to come in with her home BP check. Thanks.

## 2018-09-05 ENCOUNTER — Telehealth: Payer: Self-pay

## 2018-09-05 ENCOUNTER — Ambulatory Visit: Payer: Medicaid Other

## 2018-09-05 ENCOUNTER — Ambulatory Visit: Payer: Medicaid Other | Admitting: Licensed Clinical Social Worker

## 2018-09-05 ENCOUNTER — Other Ambulatory Visit: Payer: Self-pay | Admitting: Family Medicine

## 2018-09-05 VITALS — BP 162/95

## 2018-09-05 DIAGNOSIS — I10 Essential (primary) hypertension: Secondary | ICD-10-CM

## 2018-09-05 DIAGNOSIS — F418 Other specified anxiety disorders: Secondary | ICD-10-CM

## 2018-09-05 MED ORDER — HYDROCHLOROTHIAZIDE 25 MG PO TABS: 25.0000 mg | ORAL_TABLET | Freq: Every day | ORAL | 1 refills | Status: DC

## 2018-09-05 NOTE — Progress Notes (Signed)
Pt presents in nurse clinic for BP check.   BP today: 180/100 Recheck: 162/95  Pt stated she has taken her BP meds this morning.   Will route to pcp.

## 2018-09-05 NOTE — Telephone Encounter (Signed)
Pt called and She's having problems getting tramadol filled, can we check on prior auth and let pt know.

## 2018-09-05 NOTE — Telephone Encounter (Signed)
PCP would like pt to come in to be seen for her next available for hypertension. I left VM on pts phone asking her to call back for 10/11 apt. Please help in scheduling when she calls back.

## 2018-09-05 NOTE — Progress Notes (Signed)
I evaluated patient. She is asymptomatic. Cardiac exam benign. No murmur. Repeat BP checked by me was 162/95. I recommended going up on HCTZ from 12.5 mg qd to 25 mg qd. Last dose of her HCTZ 12.5 mg was around 7:30 AM today. She will take the second dose when she gets home.  F/U in about 2-4 weeks.   Please help patient make f/u appointment with me. Thanks

## 2018-09-05 NOTE — BH Specialist Note (Deleted)
Integrated Behavioral Health Follow Up Visit  MRN: [id number] Name: Lisa Newton  Number of Integrated Behavioral Health Clinician visits: 2/6 Session Start time: 9:45  Session End time: 10:15 Total time: 30 minutes  Type of Service: Integrated Behavioral Health- Individual/Family Interpretor:No. Interpretor Name and Language: No  SUBJECTIVE: Lisa Newton is a 47 y.o. female presenting with psychosocial stressors and concerns related to her upcoming surgery. Patient discussed the concerns of surgery with Social Worker and expressed after her first back surgery the pain got much worse. Social Work Tax inspectorintern also discussed with patient housing options and referred her to the [employer]. Social Work Tax inspectorintern also discussed [employer] being a possible option for Medical illustratorclothing vouchers.  Client was concerned about the gas in her car after coming to her appointment. Social Worker provided patient with two [employer] cards to assess this need.  Patient lives with her daughter OBJECTIVE: Mood: sad and Affect: Appropriate and Tearful at times Risk of harm to self or others: No plan to harm self or others  Issues discussed:  Benefits and drawbacks of surgery, community resources for housing and clothing ,    GOALS ADDRESSED:  Patient will: 1. Increase knowledge and/or ability of: coping skills including deep breathing techniques. 2. Follow up with [employer]. 3. Contact  [employer] for possible Engineering geologistclothing voucher    4.   FU with Child psychotherapistocial Worker in two weeks. Intervention:  solutions focus strategies, relaxed breathing ;Screening Tool(s)  Administered, Problem-solving teaching/coping strategies and Supportive Counseling ; Link to [employer] Assessment/Plan: Patient currently experiencing ***.  Patient may benefit from and is in agreement to  1. receive further assessment and brief therapeutic interventions to assist with managing  current psychosocial stressors.  1. Follow up with behavioral health clinician in 2 weeks 2. Behavioral recommendations: relaxed breathing 3. Referral(s): [employer]:  PhotographerHousing and Transportation and clothing   Nilsa Nuttingarrian Price MSW Social Work Intern [facility] [phone]

## 2018-09-05 NOTE — Telephone Encounter (Signed)
Patient states Tramadol needs PA. Will do that today.

## 2018-09-06 ENCOUNTER — Encounter: Payer: Self-pay | Admitting: Neurology

## 2018-09-06 ENCOUNTER — Ambulatory Visit: Payer: Medicaid Other | Admitting: Neurology

## 2018-09-06 VITALS — BP 153/94 | HR 89 | Ht 66.0 in | Wt 210.2 lb

## 2018-09-06 DIAGNOSIS — G43709 Chronic migraine without aura, not intractable, without status migrainosus: Secondary | ICD-10-CM | POA: Diagnosis not present

## 2018-09-06 DIAGNOSIS — IMO0002 Reserved for concepts with insufficient information to code with codable children: Secondary | ICD-10-CM

## 2018-09-06 DIAGNOSIS — G43909 Migraine, unspecified, not intractable, without status migrainosus: Secondary | ICD-10-CM | POA: Insufficient documentation

## 2018-09-06 MED ORDER — SUMATRIPTAN SUCCINATE 50 MG PO TABS: 50.0000 mg | ORAL_TABLET | ORAL | 6 refills | Status: DC | PRN

## 2018-09-06 MED ORDER — PROPRANOLOL HCL 80 MG PO TABS: 80.0000 mg | ORAL_TABLET | Freq: Two times a day (BID) | ORAL | 6 refills | Status: DC

## 2018-09-06 NOTE — BH Specialist Note (Addendum)
Integrated Behavioral Health Follow Up Visit  MRN: [id number] Name: Lisa Newton  Number of Integrated Behavioral Health Clinician visits: 2/6 Session Start time: 9:45  Session End time: 10:15 Total time: 30 minutes  Type of Service: Integrated Behavioral Health- Individual/Family Interpretor:No. Interpretor Name and Language: No  SUBJECTIVE: Lisa Newton is a 47 y.o. female presenting with psychosocial stressors and concerns related to her upcoming surgery. OBJECTIVE: Mood: sad and Affect: Appropriate and Tearful at times Risk of harm to self or others: No plan to harm self or others Issues discussed:  Benefits and drawbacks of surgery, community resources /options for housing and clothing [employer], [employer], gas for transportation.     GOALS ADDRESSED:  Patient will: 1. Increase knowledge and/or ability of: coping skills including deep breathing techniques. 2. Follow up with [employer]. 3. Contact [employer] for possible Engineering geologist    4.   FU with Child psychotherapist in two weeks. Intervention:  solutions focus strategies, relaxed breathing ;Screening Tool(s)  Administered, Problem-solving teaching/coping strategies and Supportive Counseling ; Link to [employer], Child psychotherapist provided patient with two [employer] cards to assess this need.  Assessment/Plan: Patient currently experiencing distress. Patient may benefit from and is in agreement to  1. receive further assessment and brief therapeutic interventions to assist with managing current psychosocial stressors.  1. Follow up with behavioral health clinician in 2 weeks 2. Behavioral recommendations: relaxed breathing 3. Referral(s): [employer] Resources:  Housing and Transportation and clothing  Nilsa Nutting MSW Social Work Intern [facility] [phone]  The above was a joint visit. I have reviewed the note and made adjustments as  need. Sammuel Hines, LCSW Clinical Social Worker [facility]  [phone]

## 2018-09-06 NOTE — Progress Notes (Signed)
PATIENT: Lisa Newton DOB: Apr 18, 1971  Chief Complaint  Patient presents with  . Headache    Reports daily headaches that varies in severity.   She takes Tramadol everyday for back and neck pain.   She will sometimes take Tylenol.  She has previously been seen for neck pain here (in August 2018).  States she recently had abnormal MRI scans of lumbar and cervical.    . PCP    Doreene Eland, MD - referring provider  . Pain Managment    Delano Metz, MD.  She is under pain management for neck and back pain.     HISTORICAL  Lisa Newton, seen in request by her primary care physician Dr. Doreene Eland, MD, Delano Metz, MD  I have reviewed and summarized the referring note from the referring physician.  She has past medical history of hypertension, DVT, anxiety, chronic headaches, lumbar, cervical decompression surgery, is currently under pain management for chronic low back pain.  She reported a history of headaches since 2016, usually at the left retro-orbital area, pressure, often woke up with headaches, variable degree throughout the day, during severe headache, she would have light noise sensitivity, nauseous, pounding headache lasting for few hours, she has tried multiple over-the-counter medications, ibuprofen, Tylenol, tramadol with limited help, she never tried triptan treatment in the past.  She continue has chronic neck, low back pain, is under pain management, she had left cervical decompression surgery in April 2018 for neck pain, radiating pain to left shoulder, lumbar decompression surgery in January 2019 for worsening low back pain, radiating pain to bilateral lower extremity, is currently taking tramadol 50 mg to 4 tablets a day, Lyrica 150 mg 3 times a day, as needed NSAIDs, only provide temporary relief.  I have personally reviewed MRI of lumbar July 25, 2018, status post right laminotomy at L4-5 for dissect me, no definite mass-effect on the  root identified, MRI of cervical spine, stable spondylosis at C4-5, C5-6, contributing to mild spinal stenosis, and mild to moderate foraminal narrowing bilaterally, stable post surgical change at the C6 and 7, residual small posterolateral disc protrusion on the left  REVIEW OF SYSTEMS: Full 14 system review of systems performed and notable only for blurred vision, hearing loss, feeling hot, joint pain, cramps, aching muscles, skin sensitivity, confusion, headaches, snoring, restless leg, depression, anxiety All other review of systems were negative.  ALLERGIES: Allergies  Allergen Reactions  . Augmentin [Amoxicillin-Pot Clavulanate] Hives and Other (See Comments)    Took Bactrim and Augmentin together and broke out in hives.Marland KitchenMarland KitchenPt says penicillin/amoxicillin previously without any problems.the patient is unable to answer any penicillin questions as it is unknown exactly what medication broke her out in hives.  . Bactrim [Sulfamethoxazole-Trimethoprim] Hives and Other (See Comments)    Took Bactrim and Augmentin together and broke out in hives.Marland KitchenMarland KitchenPt says she took bactrim previously without any problems, she did state that she had just been released after a course of vancomycin iv and didn't know if that hadn't affected the bactrim.   . Latex Hives  . Other     Gadolinium- headaches  . Pollen Extract     UNSPECIFIED REACTION   . Tape Rash    HOME MEDICATIONS: Current Outpatient Medications  Medication Sig Dispense Refill  . Cholecalciferol (VITAMIN D3) 5000 units CAPS Take 1 capsule (5,000 Units total) by mouth daily with breakfast. Take along with calcium and magnesium. 30 capsule 5  . cyclobenzaprine (FLEXERIL) 10 MG tablet Take 1 tablet (  10 mg total) by mouth 3 (three) times daily as needed for muscle spasms. 90 tablet 0  . hydrochlorothiazide (HYDRODIURIL) 25 MG tablet Take 1 tablet (25 mg total) by mouth daily. 90 tablet 1  . ibuprofen (ADVIL,MOTRIN) 600 MG tablet Take 1 tablet (600 mg  total) by mouth every 8 (eight) hours as needed for moderate pain. 90 tablet 0  . pregabalin (LYRICA) 150 MG capsule Take 1 capsule (150 mg total) by mouth 3 (three) times daily. 90 capsule 0  . traMADol (ULTRAM) 50 MG tablet Take 1 tablet (50 mg total) by mouth every 6 (six) hours as needed for severe pain. 120 tablet 0   No current facility-administered medications for this visit.     PAST MEDICAL HISTORY: Past Medical History:  Diagnosis Date  . Acne 03/02/2018  . Anxiety   . Arthritis   . Chronic headaches   . DDD (degenerative disc disease), cervical 08/06/2018  . DDD (degenerative disc disease), lumbar 08/06/2018  . Depression 01/11/2012  . Disorder of skeletal system 07/18/2018  . DVT (deep venous thrombosis) (HCC) 04/07/2015  . Hypertension   . Left ear hearing loss   . Neck pain   . Parotitis, acute 06/11/2018  . PONV (postoperative nausea and vomiting)     PAST SURGICAL HISTORY: Past Surgical History:  Procedure Laterality Date  . cholesterol granuloma     left ear  . left ear surgery     ruptured TM  . LUMBAR LAMINECTOMY/DECOMPRESSION MICRODISCECTOMY Right 12/14/2017   Procedure: MICRODISCECTOMY LUMBAR FOUR- LUMBAR FIVE - RIGHT;  Surgeon: Coletta Memos, MD;  Location: [facility] OR;  Service: Neurosurgery;  Laterality: Right;  MICRODISCECTOMY LUMBAR 4- LUMBAR 5 - RIGHT  . POSTERIOR CERVICAL FUSION/FORAMINOTOMY N/A 03/14/2017   Procedure: LEFT C6-7 FORAMINOTOMY WITH EXCISION OF HERNIATED NUCLEUS PULPOSUS;  Surgeon: Kerrin Champagne, MD;  Location: [facility] OR;  Service: Orthopedics;  Laterality: N/A;    FAMILY HISTORY: Family History  Problem Relation Age of Onset  . Lung cancer Mother 66  . Hypertension Father     SOCIAL HISTORY: Social History   Socioeconomic History  . Marital status: Single    Spouse name: Not on file  . Number of children: 3  . Years of education: some college  . Highest education level: Not on file  Occupational History  . Occupation: Unemployed  Social  Needs  . Financial resource strain: Very hard  . Food insecurity:    Worry: Not on file    Inability: Not on file  . Transportation needs:    Medical: Not on file    Non-medical: Not on file  Tobacco Use  . Smoking status: Never Smoker  . Smokeless tobacco: Never Used  Substance and Sexual Activity  . Alcohol use: Not Currently    Alcohol/week: 0.0 standard drinks  . Drug use: Not Currently    Types: Marijuana    Comment: previously tried marijuana  . Sexual activity: Yes    Birth control/protection: None  Lifestyle  . Physical activity:    Days per week: Not on file    Minutes per session: Not on file  . Stress: Very much  Relationships  . Social connections:    Talks on phone: Not on file    Gets together: Not on file    Attends religious service: Not on file    Active member of club or organization: Not on file    Attends meetings of clubs or organizations: Not on file    Relationship status:  Not on file  . Intimate partner violence:    Fear of current or ex partner: Not on file    Emotionally abused: Not on file    Physically abused: Not on file    Forced sexual activity: Not on file  Other Topics Concern  . Not on file  Social History Narrative   Lives at home with her daughter.   Right-handed.   Rare caffeine use.     PHYSICAL EXAM   Vitals:   09/06/18 0957  BP: (!) 153/94  Pulse: 89  Weight: 210 lb 4 oz (95.4 kg)  Height: 5\' 6"  (1.676 m)    Not recorded      Body mass index is 33.94 kg/m.  PHYSICAL EXAMNIATION:  Gen: NAD, conversant, well nourised, obese, well groomed                     Cardiovascular: Regular rate rhythm, no peripheral edema, warm, nontender. Eyes: Conjunctivae clear without exudates or hemorrhage Neck: Supple, no carotid bruits. Pulmonary: Clear to auscultation bilaterally   NEUROLOGICAL EXAM:  MENTAL STATUS: Speech:    Speech is normal; fluent and spontaneous with normal comprehension.  Cognition:     Orientation  to time, place and person     Normal recent and remote memory     Normal Attention span and concentration     Normal Language, naming, repeating,spontaneous speech     Fund of knowledge   CRANIAL NERVES: CN II: Visual fields are full to confrontation. Fundoscopic exam is normal with sharp discs and no vascular changes. Pupils are round equal and briskly reactive to light. CN III, IV, VI: extraocular movement are normal. No ptosis. CN V: Facial sensation is intact to pinprick in all 3 divisions bilaterally. Corneal responses are intact.  CN VII: Face is symmetric with normal eye closure and smile. CN VIII: Hearing is normal to rubbing fingers CN IX, X: Palate elevates symmetrically. Phonation is normal. CN XI: Head turning and shoulder shrug are intact CN XII: Tongue is midline with normal movements and no atrophy.  MOTOR: There is no pronator drift of out-stretched arms. Muscle bulk and tone are normal. Muscle strength is normal.  REFLEXES: Reflexes are 2+ and symmetric at the biceps, triceps, knees, and ankles. Plantar responses are flexor.  SENSORY: Intact to light touch, pinprick, positional sensation and vibratory sensation are intact in fingers and toes.  COORDINATION: Rapid alternating movements and fine finger movements are intact. There is no dysmetria on finger-to-nose and heel-knee-shin.    GAIT/STANCE: Need to push up to get up from seated position, mildly antalgic   DIAGNOSTIC DATA (LABS, IMAGING, TESTING) - I reviewed patient records, labs, notes, testing and imaging myself where available.   ASSESSMENT AND PLAN  Lisa Newton is a 47 y.o. female   Chronic migraine headache  Start preventive medication Inderal 80 mg twice a day  Imitrex 50 mg as needed   Levert Feinstein, M.D. Ph.D.  [facility] [address] [place], [place] [address] Ph: [phone] Fax: [phone]  CC:  Doreene Eland, MD, Delano Metz, MD

## 2018-09-10 ENCOUNTER — Ambulatory Visit: Payer: Medicaid Other

## 2018-09-12 ENCOUNTER — Telehealth: Payer: Self-pay | Admitting: Pain Medicine

## 2018-09-12 NOTE — Telephone Encounter (Signed)
Continues to have pain in lower back and both legs, worse since last procedure. Advised patient that sometimes more than one procedure is necessary to achieve pain relief. Wants to come for eval appt to discuss other options.

## 2018-09-12 NOTE — Telephone Encounter (Signed)
Patient is cancel appt for procedure on 09-13-18. Says she is still having complications from last procedure. Please let Dr. Laban Emperor know.

## 2018-09-13 ENCOUNTER — Ambulatory Visit: Payer: Medicaid Other | Admitting: Pain Medicine

## 2018-09-17 ENCOUNTER — Ambulatory Visit: Payer: Medicaid Other | Admitting: Nurse Practitioner

## 2018-09-21 ENCOUNTER — Ambulatory Visit (INDEPENDENT_AMBULATORY_CARE_PROVIDER_SITE_OTHER): Payer: Medicaid Other | Admitting: Family Medicine

## 2018-09-21 ENCOUNTER — Ambulatory Visit: Payer: Medicaid Other | Admitting: Licensed Clinical Social Worker

## 2018-09-21 ENCOUNTER — Other Ambulatory Visit: Payer: Self-pay

## 2018-09-21 ENCOUNTER — Encounter: Payer: Self-pay | Admitting: Family Medicine

## 2018-09-21 VITALS — BP 144/90 | HR 86 | Temp 98.6°F | Ht 66.0 in | Wt 216.0 lb

## 2018-09-21 DIAGNOSIS — F418 Other specified anxiety disorders: Secondary | ICD-10-CM

## 2018-09-21 DIAGNOSIS — I10 Essential (primary) hypertension: Secondary | ICD-10-CM | POA: Diagnosis not present

## 2018-09-21 DIAGNOSIS — N898 Other specified noninflammatory disorders of vagina: Secondary | ICD-10-CM | POA: Diagnosis present

## 2018-09-21 DIAGNOSIS — N760 Acute vaginitis: Secondary | ICD-10-CM | POA: Diagnosis not present

## 2018-09-21 LAB — POCT WET PREP (WET MOUNT)
CLUE CELLS WET PREP WHIFF POC: NEGATIVE
Trichomonas Wet Prep HPF POC: ABSENT

## 2018-09-21 MED ORDER — ESCITALOPRAM OXALATE 20 MG PO TABS: 20.0000 mg | ORAL_TABLET | Freq: Every day | ORAL | 1 refills | Status: DC

## 2018-09-21 NOTE — Assessment & Plan Note (Signed)
Wet prep neg. Continue regular GU hygiene. Recent GC/Chlam where negative.

## 2018-09-21 NOTE — BH Specialist Note (Signed)
Integrated Behavioral Health Follow Up Visit  MRN: [id number] Name: Lisa Newton  Number of Integrated Behavioral Health Clinician visits: 3/6 Session Start time: 10:30  Session End time: 11:00 Total time: 30 minutes  Joint visit with [facility] Intern Reason for follow-up: Continue brief intervention to assist patient with managing symptoms of depression, as well as managing  stressors . Patient does not seem to be making progress towards goal.  Based on feedback from patient as well as PHQ-9/GAD score.   Depression screen [facility] 2/9 09/21/2018 09/06/2018 09/03/2018  Decreased Interest 3 3 0  Down, Depressed, Hopeless 3 3 0  PHQ - 2 Score 6 6 0  Altered sleeping 3 3 -  Tired, decreased energy 3 3 -  Change in appetite 3 3 -  Feeling bad or failure about yourself  3 3 -  Trouble concentrating 3 3 -  Moving slowly or fidgety/restless 3 - -  Suicidal thoughts 3 0 -  PHQ-9 Score 27 21 -  Difficult doing work/chores - - -  Some recent data might be hidden   GAD 7 : Generalized Anxiety Score 09/06/2018 08/14/2018 03/01/2018 02/28/2018  Nervous, Anxious, on Edge 3 3 2 3   Control/stop worrying 3 3 3 3   Worry too much - different things 3 3 3 3   Trouble relaxing 3 3 3 3   Restless 3 3 2 2   Easily annoyed or irritable 3 3 3 2   Afraid - awful might happen 2 3 0 3  Total GAD 7 Score 20 21 16 19   Anxiety Difficulty - Extremely difficult - -   Assessment:Patient continues to experience symptoms of anxiety and depression. Patient currently taking Lexapro 10mg  reports only missing one dose.  PCP increased Lexapro to 20mg .  Patient may benefit from, and is in agreement to continue ongoing assessment and brief therapeutic interventions to assist with managing her symptoms.    Plan: 1. Patient will F/U with [facility] in 2 weeks 2. Behavioral recommendations: take Lexapro 20mg  as per PCP,  3. Referral: will consider collaborative care  Psychiatrist, discussed this with patient and she is open to this option.  Will  revisit at next [facility] appointment. 10/05/18  Sammuel Hines, LCSW Behavioral Health Clinician [facility]   [phone] 4:51 PM

## 2018-09-21 NOTE — BH Specialist Note (Signed)
Integrated Behavioral Health Follow Up Visit  MRN: [id number] Name: Lisa Newton  Number of Integrated Behavioral Health Clinician visits: 3/6 Session Start time: 10:30  Session End time: 11:00 Total time: 30 minutes  Type of Service: Integrated Behavioral Health- Individual/Family Interpretor:No.   SUBJECTIVE: Lisa Newton is a 47 y.o. female   Patient was referred by Dr. Lum Babe  for managing symptoms of depression and anxiety.  Patient reports the following symptoms/concerns: chronic pain, difficulty getting out of bed, medication management   OBJECTIVE: Mood: Appropriate and Affect: Appropriate Risk of harm to self or others: No plan to harm self or others  LIFE CONTEXT: Family: patient reports her children are a support. School/Work: patient is not currently working Life Changes: medication added, lexapro, for depressive symptoms.  GOALS ADDRESSED: Patient will: 1.  Reduce symptoms of: depression  2.  Demonstrate ability to: Improve medication compliance  INTERVENTIONS: Interventions utilized:  Supportive Counseling Standardized Assessments completed: PHQ 9, GAD-7   Office Visit from 09/21/2018 in [facility]  PHQ-9 Total Score  27     ASSESSMENT: Patient currently experiencing symptoms of depression exacerbated by her chronic pain and surgeries. Patient will meet with her doctor on 10/23 to discuss other options for pain management. Patient is also taking Lyrica but states that she only takes one in the morning and one at night and that she's supposed to be taking the medication three times a day.  Patient may benefit from continuing to meet with [facility] intern to monitor medication adherence.  PLAN: 1. Follow up with behavioral health clinician in two weeks 2. Behavioral recommendations: adhere to new medication regimen.  3. [facility] intern will monitor patient medication adherence to assess is consultation to psychiatry is appropriate.    Nilsa Nutting, SW intern Behavioral Health Clinician,  [facility] [phone]

## 2018-09-21 NOTE — Progress Notes (Signed)
Subjective:     Patient ID: Lisa Newton, female   DOB: 04-12-1971, 47 y.o.   MRN: [id number]  HPI HTN: She did not take her meds today. She is however compliant with HCTZ 25 mg qd. She was started on Propranolol 80 mg prn headache by her neurologist. Vagina discharge:Still having vaginal discharge since the last visit. She got OTC clotrimazole with minimal improvement. Sexually active with same partner. She feels she might be having reaction to the latex condom. Depression: Denies suicidal ideation currently but had thought about it in the past. Last thought was yesterday, but she denies any plan to hurt herself. SHe stated that she stopped taking the antidepressant medication prescribed to her since it was not effective. She took it for few weeks intermittently and stopped all together. Later she stated that she missed just one dose.  Current Outpatient Medications on File Prior to Visit  Medication Sig Dispense Refill  . cyclobenzaprine (FLEXERIL) 10 MG tablet Take 1 tablet (10 mg total) by mouth 3 (three) times daily as needed for muscle spasms. 90 tablet 0  . hydrochlorothiazide (HYDRODIURIL) 25 MG tablet Take 1 tablet (25 mg total) by mouth daily. 90 tablet 1  . pregabalin (LYRICA) 150 MG capsule Take 1 capsule (150 mg total) by mouth 3 (three) times daily. 90 capsule 0  . propranolol (INDERAL) 80 MG tablet Take 1 tablet (80 mg total) by mouth 2 (two) times daily. (Patient taking differently: Take 80 mg by mouth 2 (two) times daily as needed. ) 60 tablet 6  . SUMAtriptan (IMITREX) 50 MG tablet Take 1 tablet (50 mg total) by mouth every 2 (two) hours as needed for migraine. May repeat in 2 hours if headache persists or recurs. 12 tablet 6  . traMADol (ULTRAM) 50 MG tablet Take 1 tablet (50 mg total) by mouth every 6 (six) hours as needed for severe pain. 120 tablet 0  . Cholecalciferol (VITAMIN D3) 5000 units CAPS Take 1 capsule (5,000 Units total) by mouth daily with breakfast. Take along  with calcium and magnesium. 30 capsule 5  . ibuprofen (ADVIL,MOTRIN) 600 MG tablet Take 1 tablet (600 mg total) by mouth every 8 (eight) hours as needed for moderate pain. (Patient not taking: Reported on 09/21/2018) 90 tablet 0   No current facility-administered medications on file prior to visit.    Past Medical History:  Diagnosis Date  . Acne 03/02/2018  . Anxiety   . Arthritis   . Chronic headaches   . DDD (degenerative disc disease), cervical 08/06/2018  . DDD (degenerative disc disease), lumbar 08/06/2018  . Depression 01/11/2012  . Disorder of skeletal system 07/18/2018  . DVT (deep venous thrombosis) (HCC) 04/07/2015  . Hypertension   . Left ear hearing loss   . Neck pain   . Parotitis, acute 06/11/2018  . PONV (postoperative nausea and vomiting)    Vitals:   09/21/18 0949  BP: (!) 144/90  Pulse: 86  Temp: 98.6 F (37 C)  TempSrc: Oral  SpO2: 97%  Weight: 216 lb (98 kg)  Height: 5\' 6"  (1.676 m)     Review of Systems  Respiratory: Negative.   Cardiovascular: Negative.   Gastrointestinal: Negative.   Musculoskeletal: Negative.   Psychiatric/Behavioral: The patient is nervous/anxious.   All other systems reviewed and are negative.      Objective:   Physical Exam  Constitutional: She is oriented to person, place, and time. She appears well-developed. No distress.  Cardiovascular: Normal rate, regular rhythm, normal heart sounds  and intact distal pulses.  No murmur heard. Pulmonary/Chest: Effort normal and breath sounds normal. No stridor. No respiratory distress. She has no wheezes.  Abdominal: Soft. Bowel sounds are normal. She exhibits no distension and no mass. There is no tenderness.  Genitourinary: Vagina normal. Pelvic exam was performed with patient supine. There is no rash on the right labia. There is no rash on the left labia. Cervix exhibits no discharge.  Neurological: She is alert and oriented to person, place, and time.  Psychiatric: Her speech is normal  and behavior is normal. Thought content normal. Her mood appears not anxious. Cognition and memory are normal. She does not exhibit a depressed mood. She expresses no homicidal and no suicidal ideation.  Nursing note and vitals reviewed.    Office Visit from 09/21/2018 in [facility]  PHQ-9 Total Score  27     Not currently suicidal.    Assessment:     HTN Vaginitis Depression    Plan:     Check problem list.

## 2018-09-21 NOTE — Assessment & Plan Note (Signed)
Doing well on her current regimen. She is off her meds this morning. Continue current regimen.

## 2018-09-21 NOTE — Patient Instructions (Signed)
Escitalopram tablets What is this medicine? ESCITALOPRAM (es sye TAL oh pram) is used to treat depression and certain types of anxiety. This medicine may be used for other purposes; ask your health care provider or pharmacist if you have questions. COMMON BRAND NAME(S): Lexapro What should I tell my health care provider before I take this medicine? They need to know if you have any of these conditions: -bipolar disorder or a family history of bipolar disorder -diabetes -glaucoma -heart disease -kidney or liver disease -receiving electroconvulsive therapy -seizures (convulsions) -suicidal thoughts, plans, or attempt by you or a family member -an unusual or allergic reaction to escitalopram, the related drug citalopram, other medicines, foods, dyes, or preservatives -pregnant or trying to become pregnant -breast-feeding How should I use this medicine? Take this medicine by mouth with a glass of water. Follow the directions on the prescription label. You can take it with or without food. If it upsets your stomach, take it with food. Take your medicine at regular intervals. Do not take it more often than directed. Do not stop taking this medicine suddenly except upon the advice of your doctor. Stopping this medicine too quickly may cause serious side effects or your condition may worsen. A special MedGuide will be given to you by the pharmacist with each prescription and refill. Be sure to read this information carefully each time. Talk to your pediatrician regarding the use of this medicine in children. Special care may be needed. Overdosage: If you think you have taken too much of this medicine contact a poison control center or emergency room at once. NOTE: This medicine is only for you. Do not share this medicine with others. What if I miss a dose? If you miss a dose, take it as soon as you can. If it is almost time for your next dose, take only that dose. Do not take double or extra  doses. What may interact with this medicine? Do not take this medicine with any of the following medications: -certain medicines for fungal infections like fluconazole, itraconazole, ketoconazole, posaconazole, voriconazole -cisapride -citalopram -dofetilide -dronedarone -linezolid -MAOIs like Carbex, Eldepryl, Marplan, Nardil, and Parnate -methylene blue (injected into a vein) -pimozide -thioridazine -ziprasidone This medicine may also interact with the following medications: -alcohol -amphetamines -aspirin and aspirin-like medicines -carbamazepine -certain medicines for depression, anxiety, or psychotic disturbances -certain medicines for migraine headache like almotriptan, eletriptan, frovatriptan, naratriptan, rizatriptan, sumatriptan, zolmitriptan -certain medicines for sleep -certain medicines that treat or prevent blood clots like warfarin, enoxaparin, dalteparin -cimetidine -diuretics -fentanyl -furazolidone -isoniazid -lithium -metoprolol -NSAIDs, medicines for pain and inflammation, like ibuprofen or naproxen -other medicines that prolong the QT interval (cause an abnormal heart rhythm) -procarbazine -rasagiline -supplements like St. John's wort, kava kava, valerian -tramadol -tryptophan This list may not describe all possible interactions. Give your health care provider a list of all the medicines, herbs, non-prescription drugs, or dietary supplements you use. Also tell them if you smoke, drink alcohol, or use illegal drugs. Some items may interact with your medicine. What should I watch for while using this medicine? Tell your doctor if your symptoms do not get better or if they get worse. Visit your doctor or health care professional for regular checks on your progress. Because it may take several weeks to see the full effects of this medicine, it is important to continue your treatment as prescribed by your doctor. Patients and their families should watch out for  new or worsening thoughts of suicide or depression. Also watch out for   sudden changes in feelings such as feeling anxious, agitated, panicky, irritable, hostile, aggressive, impulsive, severely restless, overly excited and hyperactive, or not being able to sleep. If this happens, especially at the beginning of treatment or after a change in dose, call your health care professional. You may get drowsy or dizzy. Do not drive, use machinery, or do anything that needs mental alertness until you know how this medicine affects you. Do not stand or sit up quickly, especially if you are an older patient. This reduces the risk of dizzy or fainting spells. Alcohol may interfere with the effect of this medicine. Avoid alcoholic drinks. Your mouth may get dry. Chewing sugarless gum or sucking hard candy, and drinking plenty of water may help. Contact your doctor if the problem does not go away or is severe. What side effects may I notice from receiving this medicine? Side effects that you should report to your doctor or health care professional as soon as possible: -allergic reactions like skin rash, itching or hives, swelling of the face, lips, or tongue -anxious -black, tarry stools -changes in vision -confusion -elevated mood, decreased need for sleep, racing thoughts, impulsive behavior -eye pain -fast, irregular heartbeat -feeling faint or lightheaded, falls -feeling agitated, angry, or irritable -hallucination, loss of contact with reality -loss of balance or coordination -loss of memory -painful or prolonged erections -restlessness, pacing, inability to keep still -seizures -stiff muscles -suicidal thoughts or other mood changes -trouble sleeping -unusual bleeding or bruising -unusually weak or tired -vomiting Side effects that usually do not require medical attention (report to your doctor or health care professional if they continue or are bothersome): -changes in appetite -change in sex  drive or performance -headache -increased sweating -indigestion, nausea -tremors This list may not describe all possible side effects. Call your doctor for medical advice about side effects. You may report side effects to FDA at 1-800-FDA-1088. Where should I keep my medicine? Keep out of reach of children. Store at room temperature between 15 and 30 degrees C (59 and 86 degrees F). Throw away any unused medicine after the expiration date. NOTE: This sheet is a summary. It may not cover all possible information. If you have questions about this medicine, talk to your doctor, pharmacist, or health care provider.  2018 [employer]vier/Gold Standard (2016-05-02 13:20:23)  

## 2018-09-21 NOTE — Assessment & Plan Note (Signed)
Not currently suicidal. Patient changed her story multiple times regarding her compliance with Lexapro 10 mg daily. I called her to go over medication after the visit and she stated that she took the medicine and missed just one dose. She was also evaluated today by the [facility] specialist. As discussed with her, I will go ahead and increase the dose of her Lexapro. F/U with [facility] as planned.

## 2018-09-24 ENCOUNTER — Ambulatory Visit: Payer: Medicaid Other | Admitting: Allergy

## 2018-10-02 NOTE — Progress Notes (Deleted)
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   Provocative Test(s):  Phalen's test: deferred Tinel's test: deferred Apley's scratch test (touch opposite shoulder):  Action 1 (Across chest): deferred Action 2 (Overhead): deferred Action 3 (LB reach): deferred    Thoracic Spine Area Exam  Skin & Axial Inspection: No masses, redness, or swelling Alignment: Symmetrical Functional ROM: Unrestricted ROM Stability: No instability detected Muscle Tone/Strength: Functionally intact. No obvious neuro-muscular anomalies detected. Sensory (Neurological): Unimpaired Muscle strength & Tone: No palpable anomalies  Lumbar Spine Area Exam  Skin & Axial Inspection: No masses, redness, or swelling Alignment: Symmetrical Functional ROM: Unrestricted ROM        Stability: No instability detected Muscle Tone/Strength: Functionally intact. No obvious neuro-muscular anomalies detected. Sensory (Neurological): Unimpaired Palpation: No palpable anomalies       Provocative Tests: Hyperextension/rotation test: deferred today       Lumbar quadrant test (Kemp's test): deferred today       Lateral bending test: deferred today       Patrick's Maneuver: deferred today                   FABER test: deferred today                   S-I anterior distraction/compression test: deferred today         S-I lateral compression test: deferred today         S-I Thigh-thrust test: deferred today         S-I Gaenslen's test: deferred today          Gait & Posture Assessment  Ambulation: Unassisted Gait: Relatively normal for age and body habitus Posture: WNL   Lower Extremity Exam    Side: Right lower extremity  Side: Left lower extremity  Stability: No instability observed          Stability: No instability observed          Skin & Extremity Inspection: Skin color, temperature, and hair growth are WNL. No peripheral edema or cyanosis. No masses, redness, swelling, asymmetry, or associated skin lesions. No contractures.  Skin & Extremity Inspection: Skin color, temperature, and hair growth are WNL. No peripheral edema or cyanosis. No masses, redness, swelling, asymmetry, or associated skin lesions. No contractures.  Functional ROM: Unrestricted ROM                  Functional ROM: Unrestricted ROM                  Muscle Tone/Strength: Functionally intact. No obvious neuro-muscular anomalies detected.  Muscle Tone/Strength: Functionally intact. No obvious neuro-muscular anomalies detected.  Sensory (Neurological): Unimpaired  Sensory (Neurological): Unimpaired  Palpation: No palpable anomalies  Palpation: No palpable anomalies   Assessment  Primary Diagnosis & Pertinent Problem List: The primary encounter diagnosis was Chronic lower extremity pain (Primary  Area of Pain) (Bilateral) (R>L). Diagnoses of Chronic low back pain (Secondary Area of Pain) (Bilateral) (R>L) w/ sciatica (Right), Hx of  cocaine abuse ([place]), and Vitamin D insufficiency were also pertinent to this visit.  Status Diagnosis  Controlled Controlled Controlled 1. Chronic lower extremity pain (Primary Area of Pain) (Bilateral) (R>L)   2. Chronic low back pain (Secondary Area of Pain) (Bilateral) (R>L) w/ sciatica (Right)   3. Hx of cocaine abuse ([place])   4. Vitamin D insufficiency     Problems updated and reviewed during this visit: Problem  Chronic lower extremity pain (Primary Area of Pain) (Bilateral) (R>L)   Plan of Care  Pharmacotherapy (Medications Ordered): No orders of the defined types were placed in this encounter.  Medications administered today: Dolan Amen had no medications administered during this visit.  Procedure Orders    No procedure(s) ordered today   Lab Orders  No laboratory test(s) ordered today   Imaging Orders  No imaging studies ordered today   Referral Orders  No referral(s) requested today   Interventional management options: Planned, scheduled, and/or pending:   Diagnostic caudal ESI + diagnostic epidurogram #1 under fluoroscopic guidance and IV sedation    Considering:   Diagnostic caudal ESI + diagnostic epidurogram #1 Possible Racz procedure Diagnosticleft-sided L5-S1lumbar ESI Diagnostic left sided L5 transforaminal ESI Diagnostic bilateral lumbar facet nerve block Possible bilateral lumbar facetRFA Diagnostic bilateral sacroiliac joint block Possible bilateral sacroiliac joint RFA Diagnostic left cervical facet block Possible left cervical facetRFA   Palliative PRN treatment(s):   None at this time   Provider-requested follow-up: No follow-ups on file.  Future Appointments  Date Time Provider [place]  10/03/2018  9:00 AM Kennith Gain, MD [facility] None  10/03/2018 11:15 AM  Milinda Pointer, MD [facility] None  10/05/2018  9:30 AM [place] [facility]  11/27/2018 10:00 AM Marcial Pacas, MD [facility] None   Primary Care Physician: Kinnie Feil, MD Location: [facility] Note by: Gaspar Cola, MD Date: 10/03/2018; Time: 8:11 AM

## 2018-10-03 ENCOUNTER — Ambulatory Visit: Payer: Medicaid Other | Admitting: Pain Medicine

## 2018-10-03 ENCOUNTER — Ambulatory Visit (INDEPENDENT_AMBULATORY_CARE_PROVIDER_SITE_OTHER): Payer: Medicaid Other | Admitting: Allergy

## 2018-10-03 ENCOUNTER — Encounter: Payer: Self-pay | Admitting: Allergy

## 2018-10-03 ENCOUNTER — Telehealth: Payer: Self-pay | Admitting: *Deleted

## 2018-10-03 VITALS — BP 140/88 | HR 92 | Temp 98.4°F | Resp 20 | Ht 65.5 in | Wt 219.8 lb

## 2018-10-03 DIAGNOSIS — L988 Other specified disorders of the skin and subcutaneous tissue: Secondary | ICD-10-CM | POA: Diagnosis not present

## 2018-10-03 DIAGNOSIS — T508X5D Adverse effect of diagnostic agents, subsequent encounter: Secondary | ICD-10-CM

## 2018-10-03 DIAGNOSIS — L231 Allergic contact dermatitis due to adhesives: Secondary | ICD-10-CM

## 2018-10-03 DIAGNOSIS — H1013 Acute atopic conjunctivitis, bilateral: Secondary | ICD-10-CM | POA: Diagnosis not present

## 2018-10-03 DIAGNOSIS — J3089 Other allergic rhinitis: Secondary | ICD-10-CM

## 2018-10-03 DIAGNOSIS — L819 Disorder of pigmentation, unspecified: Secondary | ICD-10-CM

## 2018-10-03 MED ORDER — FLUTICASONE PROPIONATE 50 MCG/ACT NA SUSP: NASAL | 5 refills | Status: DC

## 2018-10-03 MED ORDER — OLOPATADINE HCL 0.7 % OP SOLN: 1.0000 [drp] | Freq: Every day | OPHTHALMIC | 5 refills | Status: DC

## 2018-10-03 MED ORDER — CETIRIZINE HCL 10 MG PO TABS: 10.0000 mg | ORAL_TABLET | Freq: Every day | ORAL | 5 refills | Status: DC

## 2018-10-03 NOTE — Patient Instructions (Addendum)
Contact dermatitis   - concerned you may have contact dermatitis especially with rash development related to PICC line use and adhesives   - recommend perform patch testing with TRUE test patch (see below for chemicals/substances in the test)   - return on a Monday for patch test placement with return visits on Wednesday and Friday of same week for readings.   Would recommend performing patch testing November 4th and beyond to ensure systemic steroid injection you had doesn't interfere with testing.   Allergic rhinoconjunctivitis   - environmental allergy testing today is positive to grass pollen, dust mites, dog and horse.  Allergen avoidance measures discussed/handouts provided   - recommend use of Zyrtec 10mg  daily as needed for generalized allergy symptoms (sneezing, congestion, ear fullness, headace)   - for nasal congestion/drainage recommend use of nasal steroid spray like Flonase, Nasacort or Rhinocort 2 sprays each nostril daily as needed.  Use for 1-2 weeks at a time before stopping once symptoms improve.     - for watery/itchy/red eyes can use Pazeo 1 drop each eye daily as needed.    Skin rash   - lighter and darker spots on skin are likely not related to allergies.  Can refer to dermatology for skin evaluation of these lesions.    Reaction to contrast   - in future if contrast is needed can perform following regimen to reduce chance of symptoms with contrast use: Prednisone 50mg  13 hrs, 7hr and 1 hr prior to study with benadryl 50mg  1 hr prior to study   - let radiology know you have had symptoms with previous contrast use  Follow-up for patch testing   True Test looks for the following sensitivities:

## 2018-10-03 NOTE — Progress Notes (Signed)
New Patient Note  RE: Lisa Newton MRN: [id number] DOB: 1971-04-08 Date of Office Visit: 10/03/2018  Referring provider: Doreene Eland, MD Primary care provider: Doreene Eland, MD  Chief Complaint: skin problems  History of present illness: Lisa Newton is a 47 y.o. female presenting today for consultation for skin issues.    She states she has been having concerns about her skin for the past 3 years or so.  She states she if she gets a cut or abrasion or a "bump on her face" it turns dark.  She also states she has been getting "light spots" as well in random areas on body.  These spots do not are not itchy or painful and are not changing she is just concerned with the appearance.  She has not been to dermatologist and at this time does not have a referral.    She also is concerned for skin reaction she had after use of PICC line use in 2016 use in treatment of osteomyelitis of lt petrous bone.  She states the PICC line was removed and replaced in different arms (both arms and chest) and every place she developed a rash.  She provided with pictures of the rash which was papular in appearance.  She states she has not had any skin changes/rash with use of regular bandaid adhesives.     She also states that during this time she required use of MRI with gadolinium.  She states she has had issues with MRIs and afterwards would develop a HA and a "skin rash" that she described as welts.  She is concerned for future MRI needs.  She is also concerned about possible allergy to gadolinium as she had a toxic metal panel run (24hr urine study) and was found to have elevated levels of aluminum, antimony, gadolinium, thallium and tungsten.    She states she also seems to get itchy when she showers with warm to hot showers.  She denies any rash with this.  She also denies any hives outside of possible use of the gadolinium for MRI.  She states about 3 weeks ago she had steroid injections for  back surgery she has had done this year.    She does report nasal congestion/drainage, watery eyes, sneezing, ear fullness, HA across forehead.  Symptoms are year-round.  She states she thinks letter medications are really has not tried any allergy medications for relief of the symptoms.  She has seen neurology and was diangosed with migraines and is on imitrex which she is not sure if it is helping her that much.    Review of systems: Review of Systems  Constitutional: Negative for chills, fever and malaise/fatigue.  HENT: Positive for congestion. Negative for ear discharge, nosebleeds, sinus pain and sore throat.   Eyes: Negative for pain, discharge and redness.  Respiratory: Negative for cough, shortness of breath and wheezing.   Cardiovascular: Negative for chest pain.  Gastrointestinal: Negative for abdominal pain, constipation, diarrhea, nausea and vomiting.  Musculoskeletal: Positive for back pain and joint pain.  Skin: Positive for rash. Negative for itching.  Neurological: Positive for headaches.    All other systems negative unless noted above in HPI  Past medical history: Past Medical History:  Diagnosis Date  . Acne 03/02/2018  . Anxiety   . Arthritis   . Chronic headaches   . DDD (degenerative disc disease), cervical 08/06/2018  . DDD (degenerative disc disease), lumbar 08/06/2018  . Depression 01/11/2012  . Disorder of skeletal  system 07/18/2018  . DVT (deep venous thrombosis) (HCC) 04/07/2015  . Hypertension   . Left ear hearing loss   . Neck pain   . Parotitis, acute 06/11/2018  . PONV (postoperative nausea and vomiting)     Past surgical history: Past Surgical History:  Procedure Laterality Date  . cholesterol granuloma     left ear  . left ear surgery     ruptured TM  . LUMBAR LAMINECTOMY/DECOMPRESSION MICRODISCECTOMY Right 12/14/2017   Procedure: MICRODISCECTOMY LUMBAR FOUR- LUMBAR FIVE - RIGHT;  Surgeon: Coletta Memos, MD;  Location: [facility] OR;  Service:  Neurosurgery;  Laterality: Right;  MICRODISCECTOMY LUMBAR 4- LUMBAR 5 - RIGHT  . POSTERIOR CERVICAL FUSION/FORAMINOTOMY N/A 03/14/2017   Procedure: LEFT C6-7 FORAMINOTOMY WITH EXCISION OF HERNIATED NUCLEUS PULPOSUS;  Surgeon: Kerrin Champagne, MD;  Location: [facility] OR;  Service: Orthopedics;  Laterality: N/A;    Family history:  Family History  Problem Relation Age of Onset  . Lung cancer Mother 74  . Hypertension Father   . Eczema Daughter   . Asthma Neg Hx   . Urticaria Neg Hx   . Allergic rhinitis Neg Hx     Social history: She lives in a condo with carpeting with electric heating and central cooling.  There is a dog in the home.  There are cats outside the home.  There is no concern for water damage, mildew averages in the home.  She is currently unemployed.  She denies a smoking history.  Medication List: Allergies as of 10/03/2018      Reactions   Augmentin [amoxicillin-pot Clavulanate] Hives, Other (See Comments)   Took Bactrim and Augmentin together and broke out in hives.Marland KitchenMarland KitchenPt says penicillin/amoxicillin previously without any problems.the patient is unable to answer any penicillin questions as it is unknown exactly what medication broke her out in hives.   Bactrim [sulfamethoxazole-trimethoprim] Hives, Other (See Comments)   Took Bactrim and Augmentin together and broke out in hives.Marland KitchenMarland KitchenPt says she took bactrim previously without any problems, she did state that she had just been released after a course of vancomycin iv and didn't know if that hadn't affected the bactrim.    Latex Hives   Other    Gadolinium- headaches   Pollen Extract    UNSPECIFIED REACTION    Tape Rash      Medication List        Accurate as of 10/03/18  1:27 PM. Always use your most recent med list.          cyclobenzaprine 10 MG tablet Commonly known as:  FLEXERIL Take 1 tablet (10 mg total) by mouth 3 (three) times daily as needed for muscle spasms.   escitalopram 20 MG tablet Commonly known as:   LEXAPRO Take 1 tablet (20 mg total) by mouth daily.   hydrochlorothiazide 25 MG tablet Commonly known as:  HYDRODIURIL Take 1 tablet (25 mg total) by mouth daily.   ibuprofen 600 MG tablet Commonly known as:  ADVIL,MOTRIN Take 1 tablet (600 mg total) by mouth every 8 (eight) hours as needed for moderate pain.   meloxicam 15 MG tablet Commonly known as:  MOBIC TAKE 1 TABLET BY MOUTH EVERY DAY AS NEEDED FOR PAIN   pregabalin 150 MG capsule Commonly known as:  LYRICA Take 1 capsule (150 mg total) by mouth 3 (three) times daily.   propranolol 80 MG tablet Commonly known as:  INDERAL Take 1 tablet (80 mg total) by mouth 2 (two) times daily.   SUMAtriptan 50 MG tablet  Commonly known as:  IMITREX Take 1 tablet (50 mg total) by mouth every 2 (two) hours as needed for migraine. May repeat in 2 hours if headache persists or recurs.   traMADol 50 MG tablet Commonly known as:  ULTRAM Take 1 tablet (50 mg total) by mouth every 6 (six) hours as needed for severe pain.   Vitamin D3 5000 units Caps Take 1 capsule (5,000 Units total) by mouth daily with breakfast. Take along with calcium and magnesium.       Known medication allergies: Allergies  Allergen Reactions  . Augmentin [Amoxicillin-Pot Clavulanate] Hives and Other (See Comments)    Took Bactrim and Augmentin together and broke out in hives.Marland KitchenMarland KitchenPt says penicillin/amoxicillin previously without any problems.the patient is unable to answer any penicillin questions as it is unknown exactly what medication broke her out in hives.  . Bactrim [Sulfamethoxazole-Trimethoprim] Hives and Other (See Comments)    Took Bactrim and Augmentin together and broke out in hives.Marland KitchenMarland KitchenPt says she took bactrim previously without any problems, she did state that she had just been released after a course of vancomycin iv and didn't know if that hadn't affected the bactrim.   . Latex Hives  . Other     Gadolinium- headaches  . Pollen Extract     UNSPECIFIED  REACTION   . Tape Rash     Physical examination: Blood pressure 140/88, pulse 92, temperature 98.4 F (36.9 C), temperature source Oral, resp. rate 20, height 5' 5.5" (1.664 m), weight 219 lb 12.8 oz (99.7 kg), SpO2 96 %.  General: Alert, interactive, in no acute distress. HEENT: PERRLA, TMs pearly gray, turbinates mildly edematous without discharge, post-pharynx non erythematous. Neck: Supple without lymphadenopathy. Lungs: Clear to auscultation without wheezing, rhonchi or rales. {no increased work of breathing. CV: Normal S1, S2 without murmurs. Abdomen: Nondistended, nontender. Skin: Several hyperpigmented papules across face.  Bilateral arms have hyperpigmented lesions that appear to be healed scars.  Upper and mid back with healed surgical hyperpigmented scars.  There are several hypopigmented very small macules about the size of a pinhead on bilateral arms. Extremities:  No clubbing, cyanosis or edema. Neuro:   Grossly intact.  Diagnositics/Labs:  Allergy testing: Environmental allergy skin prick testing is positive to [place] grass, dust mites, dog and horse Allergy testing results were read and interpreted by provider, documented by clinical staff.   Assessment and plan:   Contact dermatitis   - concerned you may have contact dermatitis especially with rash development related to PICC line use and adhesives   - recommend perform patch testing with TRUE test patch (see below for chemicals/substances in the test)   - return on a Monday for patch test placement with return visits on Wednesday and Friday of same week for readings.   Would recommend performing patch testing November 4th and beyond to ensure systemic steroid injection you had doesn't interfere with testing.   Allergic rhinoconjunctivitis   - environmental allergy testing today is positive to grass pollen, dust mites, dog and horse.  Allergen avoidance measures discussed/handouts provided   - recommend use of  Zyrtec 10mg  daily as needed for generalized allergy symptoms (sneezing, congestion, ear fullness, headace)   - for nasal congestion/drainage recommend use of nasal steroid spray like Flonase, Nasacort or Rhinocort 2 sprays each nostril daily as needed.  Use for 1-2 weeks at a time before stopping once symptoms improve.     - for watery/itchy/red eyes can use Pazeo 1 drop each eye daily as needed.  Hypo-and hyperpigmented skin lesions   -The hypopigmented skin macules appear to be normal aging spots that are likely benign.  She also has several hyperpigmented lesions that appear to be changes from skin injury.  She is concerned about both of these and thus will recommend dermatology referral.  Reaction to contrast   -Based off of report of what has happened following gadolinium for MRI I am not convinced that she has IgE mediated allergy to gadolinium.  However she does endorse having headaches and potential rash described as hives.  I did discuss with her that the toxic metal urine panel that she had done does not indicate that she has an allergy.     - in future if contrast is needed can perform following regimen to reduce chance of symptoms with contrast use: Prednisone 50mg  13 hrs, 7hr and 1 hr prior to study with benadryl 50mg  1 hr prior to study   Follow-up for patch testing  I appreciate the opportunity to take part in [place] care. Please do not hesitate to contact me with questions.  Sincerely,   Margo Aye, MD Allergy/Immunology [facility]

## 2018-10-03 NOTE — Telephone Encounter (Signed)
Please refer patient to dermatology per Dr Delorse Lek

## 2018-10-05 ENCOUNTER — Ambulatory Visit: Payer: Medicaid Other

## 2018-10-08 NOTE — Telephone Encounter (Signed)
It looks like the patient has Medicaid and will need to get a referral from her PCP. I will route this message to their office.    Skin rash   - lighter and darker spots on skin are likely not related to allergies.  Can refer to dermatology for skin evaluation of these lesions.

## 2018-10-08 NOTE — Telephone Encounter (Signed)
Thank you for your help.

## 2018-10-08 NOTE — Telephone Encounter (Signed)
Thanks. Please encourage her to schedule follow-up appointment with [place] soon.

## 2018-10-10 NOTE — Telephone Encounter (Signed)
She need to come in to be seen for referral. Please have her see [place] soon. Thanks.

## 2018-10-10 NOTE — Telephone Encounter (Signed)
Hey , just following up to see if your office has placed this referral for the patient.

## 2018-10-11 NOTE — Telephone Encounter (Signed)
Patient states understanding and will call her PCP

## 2018-10-11 NOTE — Telephone Encounter (Signed)
Ok thanks Dee.   

## 2018-10-11 NOTE — Telephone Encounter (Signed)
I left a detailed voicemail with this information regarding her needing to be seen by the PCP before we can do a referral. I called 2 Dermatologist that take [facility] and they have to have a referral from the PCP office only.

## 2018-10-12 ENCOUNTER — Ambulatory Visit (INDEPENDENT_AMBULATORY_CARE_PROVIDER_SITE_OTHER): Payer: Medicaid Other | Admitting: Licensed Clinical Social Worker

## 2018-10-12 DIAGNOSIS — F418 Other specified anxiety disorders: Secondary | ICD-10-CM

## 2018-10-12 NOTE — BH Specialist Note (Signed)
Integrated Behavioral Health Follow Up Visit  MRN: [id number] Name: Lisa Newton  Number of Integrated Behavioral Health Clinician visits: 4/6 Session Start time: 10:30  Session End time: 11:15 Total time: 40 minutes  Reason for follow-up: Continue brief intervention to assist patient with managing symptoms of anxiety and depression, as well as managing stressors .  Report of symptoms: feeling down ; trouble concentrating, and extremely difficulty in daily functions. Patient is taking 20mg  of Lexapro daily. Reports less pain due to new provider increasing pain medication.  She is now able to do cook and do a few things at home.   ASSESSMENT: Patient is making progress towards goals.  States she feels about the same with some relief in symptoms of depression, due to decrease in her pain. This is also evident by PHQ-9 scores below. Mood: Depressed and Affect: Appropriate;Thought process: Coherent;  No plan to harm self or others Patient continues to experience symptoms of depression and anxiety. Symptoms are directly related to her lack of resources, living with her daughter, not having her own things, and medical concerns. She is not interested in increasing her Lexapro at this time as she has seen some relief of symptoms with decrease in pain. Patient may benefit from, and is in agreement to start utilizing PACING with behavioral activation to assist with managing her symptoms. Updated PCP via in-basket.  GOALS:Patient will: 1. Reduce symptoms of: anxiety, depression and stress 2. Increase knowledge and/or ability of: coping skills and self-management skills  PLAN 1. Patient will F/U with [facility] in during next appointment with PCP 2. Behavioral recommendations: Behavioral Activation with PACING 3. Apply for scholarship at [facility] 4. Keep appointment with vocational Rehabilitation  Intervention: Solution-Focused Strategies, Behavioral Activation and Supportive Counseling, and Community Resource     Depression screen [facility] 2/9 10/12/2018 09/21/2018 09/06/2018  Decreased Interest 2 3 3   Down, Depressed, Hopeless 3 3 3   PHQ - 2 Score 5 6 6   Altered sleeping 2 3 3   Tired, decreased energy 2 3 3   Change in appetite 2 3 3   Feeling bad or failure about yourself  3 3 3   Trouble concentrating 3 3 3   Moving slowly or fidgety/restless 2 3 -  Suicidal thoughts 0 3 0  PHQ-9 Score 19 27 21   Difficult doing work/chores Extremely dIfficult - -  Some recent data might be hidden   GAD 7 : Generalized Anxiety Score 10/12/2018 09/06/2018 08/14/2018 03/01/2018  Nervous, Anxious, on Edge 3 3 3 2   Control/stop worrying 3 3 3 3   Worry too much - different things 3 3 3 3   Trouble relaxing 3 3 3 3   Restless 3 3 3 2   Easily annoyed or irritable 3 3 3 3   Afraid - awful might happen 3 2 3  0  Total GAD 7 Score 21 20 21 16   Anxiety Difficulty Extremely difficult - Extremely difficult -    Sammuel Hines, LCSW Behavioral Health Clinician [facility]   [phone] 11:55 AM

## 2018-10-15 ENCOUNTER — Other Ambulatory Visit: Payer: Self-pay | Admitting: Family Medicine

## 2018-10-15 NOTE — Telephone Encounter (Signed)
Looks like she is set up with her PCP on 11/06/2018

## 2018-10-19 ENCOUNTER — Other Ambulatory Visit (HOSPITAL_COMMUNITY)
Admission: RE | Admit: 2018-10-19 | Discharge: 2018-10-19 | Disposition: A | Discharge status: Home / Self Care | Payer: Medicaid Other | Source: Ambulatory Visit | Attending: Family Medicine | Admitting: Family Medicine

## 2018-10-19 ENCOUNTER — Ambulatory Visit (INDEPENDENT_AMBULATORY_CARE_PROVIDER_SITE_OTHER): Payer: Medicaid Other | Admitting: Family Medicine

## 2018-10-19 VITALS — BP 123/80 | HR 86 | Temp 98.2°F | Wt 222.0 lb

## 2018-10-19 DIAGNOSIS — N898 Other specified noninflammatory disorders of vagina: Secondary | ICD-10-CM

## 2018-10-19 LAB — POCT WET PREP (WET MOUNT)
Clue Cells Wet Prep Whiff POC: NEGATIVE
Trichomonas Wet Prep HPF POC: ABSENT

## 2018-10-19 NOTE — Progress Notes (Signed)
     Subjective: Chief Complaint  Patient presents with  . Vaginal Discharge  . vaginal odor    HPI: Lisa Newton is a 47 y.o. presenting to clinic today to discuss the following:  Vaginal discharge with odor Patient states about one month ago she had what she believed was a yeast infection. She purchased OTC topical cream and her symptoms of itching, discharge, and odor improved. About two days ago after having unprotected sex with a new partner her symptoms returned. She states she has noticed a thick white discharge with malodor for the past two days. She began using the OTC cream she purchased but then decided to come in and get checked. No abdominal tenderness, fever, chills, nausea, or vomiting.  Health Maintenance: None today     ROS noted in HPI.   Past Medical, Surgical, Social, and Family History Reviewed & Updated per EMR.   Pertinent Historical Findings include:   Social History   Tobacco Use  Smoking Status Never Smoker  Smokeless Tobacco Never Used    Objective: BP 123/80   Pulse 86   Temp 98.2 F (36.8 C) (Oral)   Wt 222 lb (100.7 kg)   SpO2 97%   BMI 36.38 kg/m  Vitals and nursing notes reviewed  Physical Exam  Constitutional: She appears well-developed and well-nourished. No distress.  HENT:  Head: Normocephalic and atraumatic.  Genitourinary: Uterus normal. There is no rash, tenderness, lesion or injury on the right labia. There is no rash, tenderness, lesion or injury on the left labia. Vaginal discharge found.  Vitals reviewed.  Results for orders placed or performed in visit on 10/19/18 (from the past 72 hour(s))  POCT Wet Prep [facility] Drown [place])     Status: None   Collection Time: 10/19/18 10:30 AM  Result Value Ref Range   Source Wet Prep POC VAG    WBC, Wet Prep HPF POC NONE    Bacteria Wet Prep HPF POC Few Few   Clue Cells Wet Prep HPF POC None None   Clue Cells Wet Prep Whiff POC Negative Whiff    Yeast Wet Prep HPF POC None None   Trichomonas Wet Prep HPF POC Absent Absent    Assessment/Plan:  Vaginal discharge Patient had vaginal discharge with odor for two days and has been taking OTC cream for yeast infection.  - Wet prep negative for BV - Finish taking OTC cream for yeast infection - Will call her for follow up for GC/CC   PATIENT EDUCATION PROVIDED: See AVS    Diagnosis and plan along with any newly prescribed medication(s) were discussed in detail with this patient today. The patient verbalized understanding and agreed with the plan. Patient advised if symptoms worsen return to clinic or ER.   Health Maintainance:   Orders Placed This Encounter  Procedures  . POCT Wet Prep [facility])    No orders of the defined types were placed in this encounter.    [name], DO 10/19/2018, 10:40 AM PGY-2 [facility]

## 2018-10-19 NOTE — Assessment & Plan Note (Signed)
Patient had vaginal discharge with odor for two days and has been taking OTC cream for yeast infection.  - Wet prep negative for BV - Finish taking OTC cream for yeast infection - Will call her for follow up for GC/CC

## 2018-10-19 NOTE — Patient Instructions (Signed)
It was great to see you today! Thank you for letting me participate in your care!  Today, we discussed your test results and so far everything is negative. Please continue using the OTC cream for another day and call use if anything changes. I will call you with the remainder of the test results on Monday when they arrive.  Be well, [name], DO PGY-2, [facility]

## 2018-10-22 ENCOUNTER — Telehealth: Payer: Self-pay | Admitting: Family Medicine

## 2018-10-22 LAB — CERVICOVAGINAL ANCILLARY ONLY
CHLAMYDIA, DNA PROBE: NEGATIVE
NEISSERIA GONORRHEA: NEGATIVE

## 2018-10-22 NOTE — Telephone Encounter (Signed)
Pt is calling to check on the status of her results from her last visit. Pt would like for someone to call her with these results.

## 2018-10-22 NOTE — Telephone Encounter (Signed)
LM for patient, ok per DPR that results haven't come back yet but that we would call when they do.  Jazmin Hartsell,CMA

## 2018-10-29 ENCOUNTER — Ambulatory Visit (INDEPENDENT_AMBULATORY_CARE_PROVIDER_SITE_OTHER): Payer: Medicaid Other | Admitting: Allergy

## 2018-10-29 ENCOUNTER — Encounter: Payer: Self-pay | Admitting: Allergy

## 2018-10-29 VITALS — BP 110/80 | HR 80 | Resp 16

## 2018-10-29 DIAGNOSIS — L2389 Allergic contact dermatitis due to other agents: Secondary | ICD-10-CM

## 2018-10-29 NOTE — Assessment & Plan Note (Signed)
Past history - concerned about contact dermatitis after surgery from adhesives.  Interim history - rash resolved after steroids. No additional flares.   True patch test placed today.

## 2018-10-29 NOTE — Progress Notes (Signed)
   Follow Up Note  RE: Chriss CzarLatonya Zwilling MRN: [id number] DOB: 1971-01-01 Date of Office Visit: 10/29/2018  Referring provider: Doreene ElandEniola, Kehinde T, MD Primary care provider: Doreene ElandEniola, Kehinde T, MD  History of Present Illness: I had the pleasure of seeing Chriss CzarLatonya Kimmer for a follow up visit at the [facility] on 10/29/2018. She is a 47 y.o. female, who is being followed for contact dermatitis, allergic rhino conjunctivitis and adverse reaction to contrast. Today she is here for patch test placement, given suspected history of contact dermatitis.   Rash cleared up after steroid injection.   Diagnostics: TRUE Test patches placed.   Assessment and Plan: [name] is a 47 y.o. female with: Allergic contact dermatitis due to other agents Past history - concerned about contact dermatitis after surgery from adhesives.  Interim history - rash resolved after steroids. No additional flares.   True patch test placed today.   The patient was instructed regarding proper care of the patches for the next 48 hours. Do not get patches wet - avoid showering until the next visit. Do not engage in vigorous physical activity.  Patient will follow up in 48 hours and 96 hours for patch readings.  It was my pleasure to see [name] today and participate in her care. Please feel free to contact me with any questions or concerns.  Sincerely,  Wyline MoodYoon Nylani Michetti, DO Allergy & Immunology  [facility]  [place] office: [phone] [facility] office:[phone]

## 2018-10-29 NOTE — Patient Instructions (Addendum)
Allergic contact dermatitis due to other agents Past history - concerned about contact dermatitis after surgery from adhesives.  Interim history - rash resolved after steroids. No additional flares.   True patch test placed today.  Return in about 2 days (around 10/31/2018) for Patch reading.   The patient was instructed regarding proper care of the patches for the next 48 hours. Do not get patches wet - avoid showering until the next visit. Do not engage in vigorous physical activity.  Patient will follow up in 48 hours and 96 hours for patch readings.

## 2018-10-31 ENCOUNTER — Ambulatory Visit: Payer: Medicaid Other | Admitting: Allergy

## 2018-10-31 ENCOUNTER — Ambulatory Visit: Payer: Medicaid Other | Admitting: Family Medicine

## 2018-10-31 ENCOUNTER — Encounter: Payer: Self-pay | Admitting: Allergy

## 2018-10-31 DIAGNOSIS — L2389 Allergic contact dermatitis due to other agents: Secondary | ICD-10-CM

## 2018-10-31 NOTE — Progress Notes (Signed)
    Follow-up Note  RE: Lisa Newton MRN: [id number] DOB: 1971-11-17 Date of Office Visit: 10/31/2018  Primary care provider: Doreene ElandEniola, Kehinde T, MD Referring provider: Doreene ElandEniola, Kehinde T, MD   [name] returns to the office today for the initial patch test interpretation, given suspected history of contact dermatitis.    Diagnostics:  TRUE TEST 48 hour reading: #20 PPD 2+ (strong positive); #23 Thimerosal 2+ (strong positive); #27 tixocortol-21-pivalate 1+ (weak positive)  Plan:  Allergic contact dermatitis  The patient has been provided detailed information regarding the substances she is sensitive to, as well as products containing the substances.  Meticulous avoidance of these substances is recommended. If avoidance is not possible, the use of barrier creams or lotions is recommended. If symptoms persist or progress despite meticulous avoidance of above substances, dermatology evaluation may be warranted.  RTC 2 days  Margo AyeShaylar Braelyn Bordonaro, MD [facility]

## 2018-11-02 ENCOUNTER — Encounter: Payer: Self-pay | Admitting: Family Medicine

## 2018-11-02 ENCOUNTER — Ambulatory Visit (INDEPENDENT_AMBULATORY_CARE_PROVIDER_SITE_OTHER): Payer: Medicaid Other | Admitting: Family Medicine

## 2018-11-02 DIAGNOSIS — L235 Allergic contact dermatitis due to other chemical products: Secondary | ICD-10-CM | POA: Diagnosis not present

## 2018-11-02 NOTE — Progress Notes (Signed)
    Follow-up Note  RE: Lisa Newton MRN: [id number] DOB: 1971/02/19 Date of Office Visit: 11/02/2018  Primary care provider: Doreene ElandEniola, Kehinde T, MD Referring provider: Doreene ElandEniola, Kehinde T, MD   [name] returns to the office today for the final patch test interpretation, given suspected history of contact dermatitis.    Diagnostics:   TRUE TEST 96-hour hour reading: positive reaction to #20 (p-Phenylene-diamine), positive reaction to #23 (Thiomersal) and positive reaction to #27 (Tixocortol-21-pivalate)  Plan:   Allergic contact dermatitis - The patient has been provided detailed information regarding the substances she is sensitive to, as well as products containing the substances.   - Meticulous avoidance of these substances is recommended.  - If avoidance is not possible, the use of barrier creams or lotions is recommended. - If symptoms persist or progress despite meticulous avoidance of the substances above, Dermatology Referral may be warranted. - If you continue to have skin reactions related to PICC line and adhesives, bring these materials with you and we can test these specific materials in a similar patch testing  Thank you for the opportunity to care for this patient.  Please do not hesitate to contact me with questions.  Thermon LeylandAnne Taija Mathias, FNP [facility]

## 2018-11-02 NOTE — Patient Instructions (Addendum)
Plan:   Allergic contact dermatitis TRUE TEST 96-hour hour reading: positive reaction to #20 (p-Phenylene-diamine), positive reaction to #23 (Thiomersal) and positive reaction to #27 (Tixocortol-21-pivalate) - The patient has been provided detailed information regarding the substances she is sensitive to, as well as products containing the substances.   - Meticulous avoidance of these substances is recommended.  - If avoidance is not possible, the use of barrier creams or lotions is recommended. - If symptoms persist or progress despite meticulous avoidance of the substances above, Dermatology Referral may be warranted. - If you continue to have skin reactions related to PICC line and adhesives, bring these materials with you and we can test these specific materials in a similar patch testing   Thank you for the opportunity to care for this patient.  Please do not hesitate to contact me with questions.  Follow up in 6 months or sooner if needed

## 2018-11-06 ENCOUNTER — Encounter: Payer: Self-pay | Admitting: Family Medicine

## 2018-11-06 ENCOUNTER — Other Ambulatory Visit: Payer: Self-pay

## 2018-11-06 ENCOUNTER — Ambulatory Visit (INDEPENDENT_AMBULATORY_CARE_PROVIDER_SITE_OTHER): Payer: Medicaid Other | Admitting: Family Medicine

## 2018-11-06 DIAGNOSIS — G8929 Other chronic pain: Secondary | ICD-10-CM | POA: Diagnosis not present

## 2018-11-06 DIAGNOSIS — R635 Abnormal weight gain: Secondary | ICD-10-CM

## 2018-11-06 DIAGNOSIS — L989 Disorder of the skin and subcutaneous tissue, unspecified: Secondary | ICD-10-CM | POA: Diagnosis present

## 2018-11-06 DIAGNOSIS — F418 Other specified anxiety disorders: Secondary | ICD-10-CM

## 2018-11-06 DIAGNOSIS — M5441 Lumbago with sciatica, right side: Secondary | ICD-10-CM

## 2018-11-06 MED ORDER — TRIAMCINOLONE 0.1 % CREAM:EUCERIN CREAM 1:1: TOPICAL_CREAM | CUTANEOUS | 0 refills | Status: DC

## 2018-11-06 NOTE — Assessment & Plan Note (Signed)
Gained 5 lbs since last office visit. Diet and exercise counseling done. Referral to nutritionist offered today but she declined. She will try to get swim exercise which will also help with her back. F/U in 4 weeks for reassessment.

## 2018-11-06 NOTE — Patient Instructions (Addendum)

## 2018-11-06 NOTE — Assessment & Plan Note (Signed)
Facial lesion looks like pimples related hyperpigmentation. Hyperpigmentation over her elbow dorsally is normal change likely due to chronic irritation. Spots on her back f/u skin allergy testing should resolve. No biopsy recommended at this point. Avoid triggers as recommended by her allergist. Trial of Eucerin cream and triamcinolone recommended. Derm referral if no improvement. She agreed with the plan.

## 2018-11-06 NOTE — Assessment & Plan Note (Signed)
Continue f/u with pain management clinic. I completed her handicap form. Also provided letter to support her disability application.

## 2018-11-06 NOTE — Assessment & Plan Note (Addendum)
No acute change from her baseline. Compliant with meds and [facility] follow-up. Continue current regimen. F/U in 4 weeks for reassessment or sooner if symptoms worsen. She agreed with the plan.

## 2018-11-06 NOTE — Progress Notes (Signed)
Subjective:     Patient ID: Lisa Newton, female   DOB: 01/26/1971, 47 y.o.   MRN: [id number]  HPI Rash: C/O rash on her face, elbow ad back which has been ongoing for more than 1 month. Rash appears as black spots on her cheeks, and darkened elbow skin. Similar lesion on her back. Associated with itching occasionally. She recently had skin allergy test and was told that she was reacting to some chemical which she needed to avoid.  Back pain: Following at the pain clinic. She is requesting DMV parking sticker since she is not able to walk long distance without pain. Weight gain: She is unable to get exercise due to pain and depression.  Depression: She is compliant with Lexapro and [facility] follow-up. Feels this is not helping a lot.  Current Outpatient Medications on File Prior to Visit  Medication Sig Dispense Refill  . escitalopram (LEXAPRO) 20 MG tablet Take 1 tablet (20 mg total) by mouth daily. 90 tablet 1  . hydrochlorothiazide (HYDRODIURIL) 25 MG tablet Take 1 tablet (25 mg total) by mouth daily. 90 tablet 1  . propranolol (INDERAL) 80 MG tablet Take 1 tablet (80 mg total) by mouth 2 (two) times daily. (Patient taking differently: Take 80 mg by mouth 2 (two) times daily as needed. ) 60 tablet 6  . cetirizine (ZYRTEC) 10 MG tablet Take 1 tablet (10 mg total) by mouth daily. 30 tablet 5  . Cholecalciferol (VITAMIN D3) 5000 units CAPS Take 1 capsule (5,000 Units total) by mouth daily with breakfast. Take along with calcium and magnesium. 30 capsule 5  . cyclobenzaprine (FLEXERIL) 10 MG tablet Take 1 tablet (10 mg total) by mouth 3 (three) times daily as needed for muscle spasms. 90 tablet 0  . ibuprofen (ADVIL,MOTRIN) 600 MG tablet Take 1 tablet (600 mg total) by mouth every 8 (eight) hours as needed for moderate pain. 90 tablet 0  . meloxicam (MOBIC) 15 MG tablet TAKE 1 TABLET BY MOUTH EVERY DAY AS NEEDED FOR PAIN  3  . Olopatadine HCl (PAZEO) 0.7 % SOLN Apply 1 drop to eye daily. 2.5 mL 5   . pregabalin (LYRICA) 150 MG capsule Take 1 capsule (150 mg total) by mouth 3 (three) times daily. 90 capsule 0  . SUMAtriptan (IMITREX) 50 MG tablet Take 1 tablet (50 mg total) by mouth every 2 (two) hours as needed for migraine. May repeat in 2 hours if headache persists or recurs. (Patient not taking: Reported on 11/06/2018) 12 tablet 6  . traMADol (ULTRAM) 50 MG tablet Take 1 tablet (50 mg total) by mouth every 6 (six) hours as needed for severe pain. 120 tablet 0   No current facility-administered medications on file prior to visit.    Past Medical History:  Diagnosis Date  . Acne 03/02/2018  . Anxiety   . Arthritis   . Chronic headaches   . DDD (degenerative disc disease), cervical 08/06/2018  . DDD (degenerative disc disease), lumbar 08/06/2018  . Depression 01/11/2012  . Disorder of skeletal system 07/18/2018  . DVT (deep venous thrombosis) (HCC) 04/07/2015  . Hypertension   . Left ear hearing loss   . Neck pain   . Parotitis, acute 06/11/2018  . PONV (postoperative nausea and vomiting)    Vitals:   11/06/18 0941  BP: 138/82  Pulse: 83  Temp: 98.1 F (36.7 C)  TempSrc: Oral  SpO2: 96%  Weight: 227 lb (103 kg)     Review of Systems  Respiratory: Negative.  Cardiovascular: Negative.   Gastrointestinal: Negative.   Musculoskeletal: Positive for back pain. Negative for gait problem.  Skin: Positive for rash.  Psychiatric/Behavioral: Negative for self-injury and suicidal ideas. The patient is not nervous/anxious.   All other systems reviewed and are negative.      Objective:   Physical Exam  Constitutional: She is oriented to person, place, and time. She appears well-developed. No distress.  Cardiovascular: Normal rate, regular rhythm and normal heart sounds.  No murmur heard. Pulmonary/Chest: Effort normal. No stridor. No respiratory distress. She has no wheezes.  Abdominal: Soft. She exhibits no distension and no mass. There is no tenderness.  Musculoskeletal:        Lumbar back: Normal.  Neurological: She is alert and oriented to person, place, and time.  Skin:  Scattered, hypopigmented circular macules on her face less than 0.415mm in size.  Dark discoloration over her elbow joint dorsally.   Single maculopapular spot on her back and an area of scab lesion on her back (F/U skin allergy testing)  Psychiatric: She has a normal mood and affect. Her speech is normal and behavior is normal. Judgment normal. Cognition and memory are normal.  No suicidal ideation. She stated that she is trying to stay strong for her daughter.  Nursing note and vitals reviewed.      Assessment:     Skin rash Chronic lumbar pain Weight gain Depression    Plan:     Check problem list.

## 2018-11-27 ENCOUNTER — Ambulatory Visit: Payer: Medicaid Other | Admitting: Neurology

## 2018-11-27 ENCOUNTER — Telehealth: Payer: Self-pay | Admitting: *Deleted

## 2018-11-27 NOTE — Telephone Encounter (Signed)
No showed follow up appointment. 

## 2018-11-28 ENCOUNTER — Encounter: Payer: Self-pay | Admitting: Family Medicine

## 2018-11-28 ENCOUNTER — Encounter: Payer: Self-pay | Admitting: Neurology

## 2018-11-29 ENCOUNTER — Encounter: Payer: Self-pay | Admitting: Allergy

## 2018-11-30 ENCOUNTER — Ambulatory Visit (INDEPENDENT_AMBULATORY_CARE_PROVIDER_SITE_OTHER): Payer: Medicaid Other | Admitting: Family Medicine

## 2018-11-30 ENCOUNTER — Other Ambulatory Visit: Payer: Self-pay

## 2018-11-30 ENCOUNTER — Encounter: Payer: Self-pay | Admitting: Family Medicine

## 2018-11-30 VITALS — BP 128/84 | HR 70 | Temp 98.6°F | Ht 66.0 in | Wt 230.0 lb

## 2018-11-30 DIAGNOSIS — Z3202 Encounter for pregnancy test, result negative: Secondary | ICD-10-CM

## 2018-11-30 DIAGNOSIS — Z6836 Body mass index (BMI) 36.0-36.9, adult: Secondary | ICD-10-CM

## 2018-11-30 DIAGNOSIS — N912 Amenorrhea, unspecified: Secondary | ICD-10-CM | POA: Diagnosis not present

## 2018-11-30 DIAGNOSIS — E6609 Other obesity due to excess calories: Secondary | ICD-10-CM

## 2018-11-30 DIAGNOSIS — L309 Dermatitis, unspecified: Secondary | ICD-10-CM | POA: Diagnosis present

## 2018-11-30 DIAGNOSIS — N926 Irregular menstruation, unspecified: Secondary | ICD-10-CM

## 2018-11-30 HISTORY — DX: Amenorrhea, unspecified: N91.2

## 2018-11-30 LAB — POCT URINE PREGNANCY: Preg Test, Ur: NEGATIVE

## 2018-11-30 NOTE — Assessment & Plan Note (Signed)
She gained extra pounds since last visit. Diet and exercise counseling done. Referral to dietician offered but she declined. Walking exercise recommended as tolerated. I will monitor closely.

## 2018-11-30 NOTE — Assessment & Plan Note (Signed)
Upreg neg. Likely premenopausal symptoms. Recheck Upreg in about 2 weeks if persistent.

## 2018-11-30 NOTE — Assessment & Plan Note (Signed)
Facial lesion likely scaring from previous Acne. As discussed with her, hyperpigmented spot on her back is from recent skin allergy testing patch application. Otherwise, no area on her back requires biopsy. I am also unable to test for Gadolinium by skin biopsy. I recommended f/u with her allergist. Derm referral also placed. F/U as needed.

## 2018-11-30 NOTE — Patient Instructions (Signed)
Dermatomyositis  Dermatomyositis is a rare muscle disease that causes muscle weakness and a rash. It usually affects muscles that are closest to the trunk of your body (torso). You may have a combination of muscle weakness, pain, and swelling. This condition can make it hard to rise from a sitting position, climb stairs, lift objects, or reach over your head.  What are the causes?  The cause of this condition is not known.  What increases the risk?  You are more likely to develop this condition if:   You are female.   You have cancer.   You have lung disease.  What are the signs or symptoms?  Symptoms of this condition may include:   A bluish-purple rash that may be itchy. This often develops before the muscle weakness. It appears on the face, neck, shoulders, upper chest, elbows, knees, knuckles, and back.   Swelling of the face and eyelids.   Hardened bumps of calcium deposits under your skin (especially in children).   Trouble swallowing, talking, and breathing.   Muscle aches and tenderness.   Fatigue.   Weight loss.   Low fever.   Open wounds (ulcers).   Heartburn from changes in muscles of the esophagus.   Muscle weakness, swelling, and pain.  How is this diagnosed?  This condition may be diagnosed based on:   Blood tests.   A test to check whether your muscles are responding properly to electrical signals from your nerves (electromyogram).   Removal of a small tissue sample from a weakened muscle to be examined under a microscope (muscle biopsy).   Removal of a small skin sample from an area with a rash to be examined under a microscope (skin biopsy).   Chest X-ray.   MRI.  How is this treated?  This condition may be treated with:   Medicines that weaken and decrease the activity of your body's disease-fighting system (immune system) and reduce inflammation (corticosteroids).   Medicine containing antibodies to help your body fight infections (immunoglobulin).   Medicines to reduce  inflammation in your body.   Physical therapy and exercise to prevent muscle loss (atrophy).  Follow these instructions at home:   Wear protective clothing and high-protection sunscreen when you go outside. Areas that are affected by a rash are more sensitive to the sun.   Use skin creams or ointments as told by your health care provider.   Take over-the-counter and prescription medicines only as told by your health care provider.   If physical therapy was prescribed, do exercises as directed.   Do not eat right before bedtime. Raise (elevate) the head of your bed to decrease heartburn.   If directed by your health care provider, eat more protein in your diet.  Contact a health care provider if:   Your condition gets worse.   You have unexplained muscle weakness.   You develop a new rash.   You have a fever and cough.  Get help right away if:   You have difficulty swallowing.   You develop breathing problems.  Summary   Dermatomyositis is a rare muscle disease that causes muscle weakness and a rash.   You may have a combination of muscle weakness, pain, and swelling.   Treatment may include medicines and physical therapy.   Wear protective clothing and high-protection sunscreen when you go outside. Areas that are affected by a rash are more sensitive to the sun.  This information is not intended to replace advice given to you   by your health care provider. Make sure you discuss any questions you have with your health care provider.  Document Released: 08/20/2002 Document Revised: 12/20/2016 Document Reviewed: 12/20/2016  [employer]vier Interactive Patient Education  2019 [employer]

## 2018-11-30 NOTE — Progress Notes (Signed)
Subjective:     Patient ID: Lisa Newton, female   DOB: 24-Apr-1971, 47 y.o.   MRN: [id number]  HPI Skin rash: She is still having issues with her skin. She mentioned a study she is enrolled in and she was informed by them that she still have Gadolinium in her body causing skin rash. She wants me to check her skin for this. Rash has improved and there is no itching currently. She denies a change in her diet although her diet has been unhealthy. Obesity: She endorsed poor diet and lack of exercise due to her back pain. Amenorrhea: LMP was first week in Nov, more than 4 weeks ago. Her period had always been regular.  Current Outpatient Medications on File Prior to Visit  Medication Sig Dispense Refill  . escitalopram (LEXAPRO) 20 MG tablet Take 1 tablet (20 mg total) by mouth daily. 90 tablet 1  . hydrochlorothiazide (HYDRODIURIL) 25 MG tablet Take 1 tablet (25 mg total) by mouth daily. 90 tablet 1  . propranolol (INDERAL) 80 MG tablet Take 1 tablet (80 mg total) by mouth 2 (two) times daily. (Patient taking differently: Take 80 mg by mouth 2 (two) times daily as needed. ) 60 tablet 6  . cetirizine (ZYRTEC) 10 MG tablet Take 1 tablet (10 mg total) by mouth daily. 30 tablet 5  . Cholecalciferol (VITAMIN D3) 5000 units CAPS Take 1 capsule (5,000 Units total) by mouth daily with breakfast. Take along with calcium and magnesium. 30 capsule 5  . cyclobenzaprine (FLEXERIL) 10 MG tablet Take 1 tablet (10 mg total) by mouth 3 (three) times daily as needed for muscle spasms. 90 tablet 0  . ibuprofen (ADVIL,MOTRIN) 600 MG tablet Take 1 tablet (600 mg total) by mouth every 8 (eight) hours as needed for moderate pain. 90 tablet 0  . meloxicam (MOBIC) 15 MG tablet TAKE 1 TABLET BY MOUTH EVERY DAY AS NEEDED FOR PAIN  3  . Olopatadine HCl (PAZEO) 0.7 % SOLN Apply 1 drop to eye daily. 2.5 mL 5  . pregabalin (LYRICA) 150 MG capsule Take 1 capsule (150 mg total) by mouth 3 (three) times daily. 90 capsule 0  .  SUMAtriptan (IMITREX) 50 MG tablet Take 1 tablet (50 mg total) by mouth every 2 (two) hours as needed for migraine. May repeat in 2 hours if headache persists or recurs. (Patient not taking: Reported on 11/06/2018) 12 tablet 6  . traMADol (ULTRAM) 50 MG tablet Take 1 tablet (50 mg total) by mouth every 6 (six) hours as needed for severe pain. 120 tablet 0  . Triamcinolone Acetonide (TRIAMCINOLONE 0.1 % CREAM : EUCERIN) CREA Apply to affected skin BID 1 each 0   No current facility-administered medications on file prior to visit.    Past Medical History:  Diagnosis Date  . Acne 03/02/2018  . Anxiety   . Arthritis   . Cervical radiculitis (Left) 12/22/2016  . Chronic headaches   . DDD (degenerative disc disease), cervical 08/06/2018  . DDD (degenerative disc disease), lumbar 08/06/2018  . Depression 01/11/2012  . Disorder of skeletal system 07/18/2018  . DVT (deep venous thrombosis) (HCC) 04/07/2015  . 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 Hypertension   . Issue of medical certificate for disability examination 10/25/2013  . Left ear hearing loss   . Lumbar radiculitis (Bilateral) 08/06/2018  . Neck pain   . Parotitis, acute 06/11/2018  . PONV (postoperative nausea and vomiting)   . 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/2018  . Primary osteoarthritis  of lumbar spine 08/06/2018  . 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   Cardiovascular: Negative.   Gastrointestinal: Negative.   Genitourinary: Positive for menstrual problem.  Musculoskeletal: Positive for back pain.  Skin: Positive for rash.  Neurological: Negative.   All other systems reviewed and are negative.      Objective:   Physical Exam Vitals signs and nursing note reviewed.  Constitutional:      General: She is not in acute distress.    Appearance: She is obese. She is not ill-appearing or diaphoretic.  Neck:     Musculoskeletal: Normal range of motion. No neck  rigidity.  Cardiovascular:     Rate and Rhythm: Normal rate.     Heart sounds: No murmur.  Pulmonary:     Effort: Pulmonary effort is normal. No respiratory distress.     Breath sounds: No wheezing.  Abdominal:     General: Bowel sounds are normal.     Palpations: Abdomen is soft.  Skin:    Comments: Scarred circular hyperpigmented macular lesions on her face each approximately less than 1mm in size  Even smaller papules barely visible on her back and three area of hyperpigmented patch ( s/p skin biopsy patch application)  Neurological:     Mental Status: She is alert.          Assessment:     Dermatitis Obesity Ammenorrhea    Plan:     Check problem list.

## 2018-12-13 ENCOUNTER — Ambulatory Visit: Payer: Medicaid Other | Admitting: Allergy

## 2018-12-21 ENCOUNTER — Ambulatory Visit: Payer: Medicaid Other | Admitting: Allergy

## 2019-01-21 ENCOUNTER — Other Ambulatory Visit: Payer: Self-pay | Admitting: Pain Medicine

## 2019-01-21 DIAGNOSIS — M7918 Myalgia, other site: Principal | ICD-10-CM

## 2019-01-21 DIAGNOSIS — G8929 Other chronic pain: Secondary | ICD-10-CM

## 2019-02-20 ENCOUNTER — Other Ambulatory Visit: Payer: Self-pay | Admitting: Pain Medicine

## 2019-02-20 DIAGNOSIS — E559 Vitamin D deficiency, unspecified: Secondary | ICD-10-CM

## 2019-02-26 ENCOUNTER — Ambulatory Visit: Payer: Medicaid Other | Admitting: Family Medicine

## 2019-03-05 ENCOUNTER — Encounter (INDEPENDENT_AMBULATORY_CARE_PROVIDER_SITE_OTHER): Payer: Self-pay | Admitting: Specialist

## 2019-04-02 ENCOUNTER — Other Ambulatory Visit: Payer: Medicaid Other

## 2019-04-02 ENCOUNTER — Telehealth (INDEPENDENT_AMBULATORY_CARE_PROVIDER_SITE_OTHER): Payer: Medicaid Other | Admitting: Family Medicine

## 2019-04-02 ENCOUNTER — Other Ambulatory Visit: Payer: Self-pay

## 2019-04-02 DIAGNOSIS — E039 Hypothyroidism, unspecified: Secondary | ICD-10-CM

## 2019-04-02 DIAGNOSIS — R300 Dysuria: Secondary | ICD-10-CM

## 2019-04-02 DIAGNOSIS — G8929 Other chronic pain: Secondary | ICD-10-CM

## 2019-04-02 DIAGNOSIS — R399 Unspecified symptoms and signs involving the genitourinary system: Secondary | ICD-10-CM

## 2019-04-02 DIAGNOSIS — I10 Essential (primary) hypertension: Secondary | ICD-10-CM

## 2019-04-02 MED ORDER — NITROFURANTOIN MACROCRYSTAL 100 MG PO CAPS: 100.0000 mg | ORAL_CAPSULE | Freq: Two times a day (BID) | ORAL | 0 refills | Status: AC

## 2019-04-02 NOTE — Progress Notes (Signed)
[facility] Telemedicine Visit  Patient consented to have virtual visit. Method of visit: Video was attempted, but technology challenges prevented patient from using video, so visit was conducted via telephone.  Encounter participants: Patient: Lisa Newton - located at home Provider: Janit Pagan - located at office Others (if applicable): NA  Chief Complaint: UTI symptoms.  HPI:  Urinary Tract Infection   This is a new (C/O smelling urine for 1 week and pressure with the use of the bathroom) problem. The current episode started in the past 7 days. The problem occurs every urination. The problem has been unchanged. Quality: Pressure pain with urination. The pain is moderate. There has been no fever. She is sexually active. There is no history of pyelonephritis. Associated symptoms include frequency. Pertinent negatives include no chills, discharge, hematuria, nausea or vomiting. Associated symptoms comments: No fever. Treatments tried: Cranberry juice and pills. The treatment provided moderate relief. There is no history of recurrent UTIs.  ZES:PQZRAQT on Metoprolol by pain specialist. Her propranolol was d/ced. Hypothyroidism:Started on Synthroid by her pain specialist.  Pain: She was started on Percocet 7.5/325mg  Q4 hrs prn by her pain specialist. Tramadol was d/ced by them.   ROS: per HPI  Pertinent PMHx: Problem list reviewed  Exam:  Respiratory: No Resp distress  Assessment/Plan: UTI symptoms. I will start on A/B to treat empirically. I however recommended urine culture to identify the organism and treat appropriately. She agreed to come to the lab this afternoon to give urine sample. I will escribe A/B after then.  HTN: Med list updated.  Hypothyroidism: Med list updated I advised her to have her pain specialist fax [place] her lab report for documentation purpose.  She agreed with the plan.  Chronic pain. Continue f/u with pain  specialist as recommended.  Time spent during visit with patient: 20 minutes  Addendum: She came in to give her urine for culture. I gave her a follow-up call to inform her that her A/B ( Nitrofurantoin) is now at her pharm. She then asked if the urinalysis will also check for STDs. I told her no, she again denies vaginal discharge. I asked her to come in to give another urine samples for STD check.  She said she will come in if she develops symptoms. F/U as needed.

## 2019-04-04 ENCOUNTER — Telehealth: Payer: Self-pay | Admitting: Family Medicine

## 2019-04-04 LAB — URINE CULTURE

## 2019-04-04 NOTE — Telephone Encounter (Signed)
Result discussed with her. Continue Nitrofurantoin. F/U soon if there is no resolution of her symptoms.

## 2019-04-09 ENCOUNTER — Telehealth: Payer: Self-pay

## 2019-04-09 NOTE — Telephone Encounter (Signed)
Pt called nurse line stating she has completed the antibiotic, however she is still experiencing urine odor. Pt denies any vaginal discharge. I informed her the medication prescribed should have helped. Pt informed to drink a lot of water. Will forward to pcp for next steps.

## 2019-04-09 NOTE — Telephone Encounter (Signed)
Help schedule f/u for reeval.

## 2019-04-09 NOTE — Telephone Encounter (Signed)
Scheduled with [place] tomorrow.

## 2019-04-10 ENCOUNTER — Other Ambulatory Visit: Payer: Self-pay

## 2019-04-10 ENCOUNTER — Telehealth (INDEPENDENT_AMBULATORY_CARE_PROVIDER_SITE_OTHER): Payer: Medicaid Other | Admitting: Family Medicine

## 2019-04-10 DIAGNOSIS — R300 Dysuria: Secondary | ICD-10-CM

## 2019-04-10 NOTE — Progress Notes (Signed)
[facility] Telemedicine Visit  Patient consented to have virtual visit. Method of visit: Video  Encounter participants: Patient: Lisa Newton - located at home  Provider: Carney Living - located at office Others (if applicable): no  Chief Complaint: urine smells  HPI:  Seen on video and had urine culture last week showed pan sensitive ecoli.  Finished 5 d course of nitrofurantoin but still has urine odor and frequency  No fever or back pain or abdomen pain or discharge   No new meds  ROS: per HPI  Exam:  Respiratory: nl  Assessment/Plan:  Will need to come in for vaginal exam and urine sample Made appointment for Thursday

## 2019-04-11 ENCOUNTER — Other Ambulatory Visit: Payer: Self-pay

## 2019-04-11 ENCOUNTER — Ambulatory Visit (INDEPENDENT_AMBULATORY_CARE_PROVIDER_SITE_OTHER): Payer: Medicaid Other | Admitting: Student in an Organized Health Care Education/Training Program

## 2019-04-11 VITALS — BP 144/82 | HR 88 | Temp 98.8°F | Ht 66.0 in | Wt 251.4 lb

## 2019-04-11 DIAGNOSIS — R350 Frequency of micturition: Secondary | ICD-10-CM | POA: Insufficient documentation

## 2019-04-11 LAB — POCT URINALYSIS DIP (MANUAL ENTRY)
Bilirubin, UA: NEGATIVE
Blood, UA: NEGATIVE
Glucose, UA: NEGATIVE mg/dL
Ketones, POC UA: NEGATIVE mg/dL
Nitrite, UA: NEGATIVE
Protein Ur, POC: NEGATIVE mg/dL
Spec Grav, UA: 1.015 (ref 1.010–1.025)
Urobilinogen, UA: 0.2 E.U./dL
pH, UA: 5.5 (ref 5.0–8.0)

## 2019-04-11 LAB — POCT UA - MICROSCOPIC ONLY

## 2019-04-11 MED ORDER — CEPHALEXIN 500 MG PO CAPS: 500.0000 mg | ORAL_CAPSULE | Freq: Two times a day (BID) | ORAL | 0 refills | Status: DC
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
## 2019-04-11 NOTE — Assessment & Plan Note (Signed)
UA notable for small leukocytes. Patient's recent urine culture sensitive to nitrofurantoin, so would have expected this to treat a UTI. However, given persistent symptoms and findings on UA we can attempt another antibiotic course.  - keflex 500 mg BID x7 days - patient has insensitivity to PCN listed in chart however she reports she has had PCN and amoxicillin multiple times in the past and tolerated them well. She reports hives in the chart were from a course of bactrim she received at the same time as augmentin. - urine culture - follow up if symptoms fail to improve

## 2019-04-11 NOTE — Patient Instructions (Signed)
It was a pleasure seeing you today in our clinic.   Our clinic's number is [phone]. Please call with questions or concerns about what we discussed today.  Be well, Dr. Juliyah Mergen   

## 2019-04-22 ENCOUNTER — Telehealth: Payer: Self-pay

## 2019-04-22 NOTE — Telephone Encounter (Signed)
Pt called nurse line stating the antibiotic given for abnormal urine smell does not seem to be working. I checked chart and looks like a culture was ordered, but results are not back. However, this was over 10 days ago. Will forward to provider who saw patient that day. Pt still endorses an abnormal smell.

## 2019-04-24 NOTE — Telephone Encounter (Signed)
If she is still symptomatic lets bring her back in for another appt

## 2019-04-24 NOTE — Telephone Encounter (Signed)
Pt informed and she has an appointment for tomorrow to have this checked.Kassidy Frankson Zimmerman Rumple, CMA\

## 2019-04-25 ENCOUNTER — Other Ambulatory Visit: Payer: Self-pay

## 2019-04-25 ENCOUNTER — Ambulatory Visit (INDEPENDENT_AMBULATORY_CARE_PROVIDER_SITE_OTHER): Payer: Medicaid Other | Admitting: Family Medicine

## 2019-04-25 ENCOUNTER — Encounter: Payer: Self-pay | Admitting: Family Medicine

## 2019-04-25 VITALS — BP 128/94 | HR 95

## 2019-04-25 DIAGNOSIS — J302 Other seasonal allergic rhinitis: Secondary | ICD-10-CM

## 2019-04-25 DIAGNOSIS — R8290 Unspecified abnormal findings in urine: Secondary | ICD-10-CM

## 2019-04-25 IMAGING — MR MR LUMBAR SPINE W/O CM
6 series · 48 of 48 positions shown · non-contrast
Comparison: 04/19/2017

CLINICAL DATA: Severe back pain, worsening over the last 7 months.
Unable to lie on back or lie stealth. Patient scanned on the side
with considerable motion degradation.

EXAM:
MRI LUMBAR SPINE WITHOUT CONTRAST
TECHNIQUE: Multiplanar, multisequence MR imaging of the lumbar spine was
performed. No intravenous contrast was administered.

[Series 5: T2 · sagittal · 4.0mm · 1.09mm/px · 5 of 15 slices shown (1 of 3)]
[im 1/15]
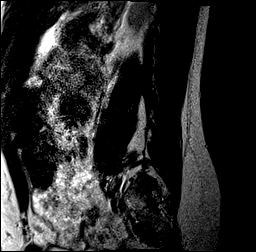
[im 4/15]
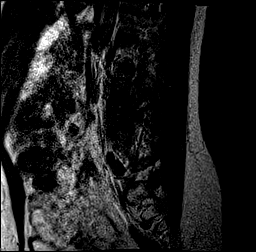
[im 8/15]
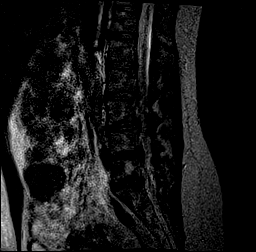
[im 11/15]
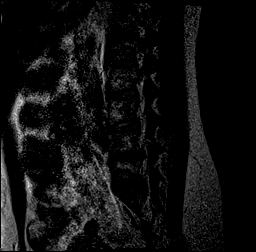
[im 15/15]
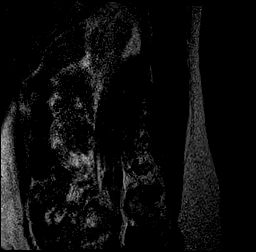

[Series 6: tirm sag · sagittal · 4.0mm · 0.73mm/px · 5 of 15 slices shown]
[im 1/15]
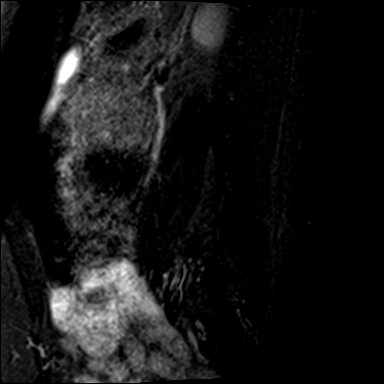
[im 4/15]
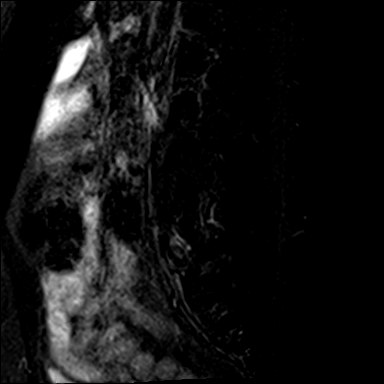
[im 8/15]
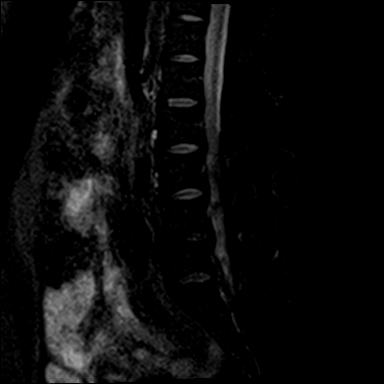
[im 11/15]
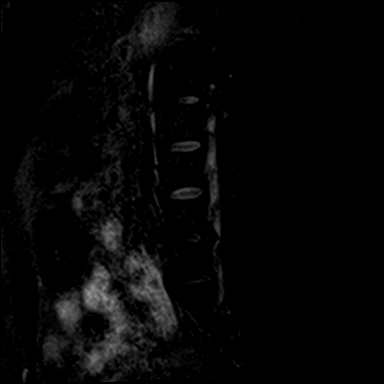
[im 15/15]
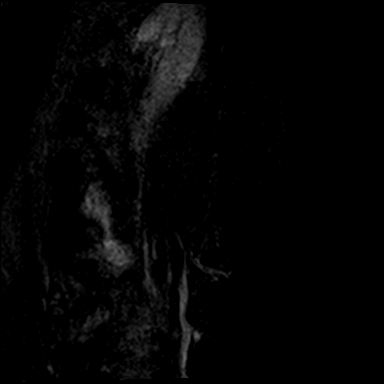

[Series 7: T1 · sagittal · 4.0mm · 0.88mm/px · 5 of 15 slices shown (1 of 2)]
[im 1/15]
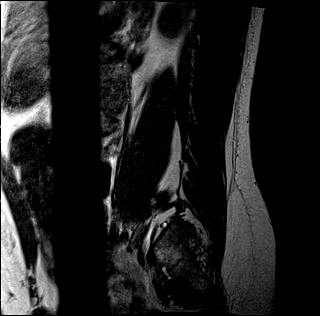
[im 4/15]
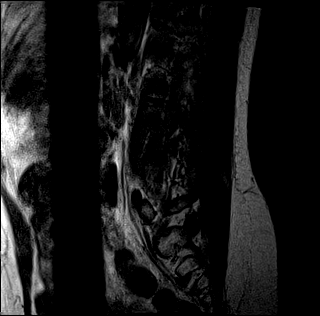
[im 8/15]
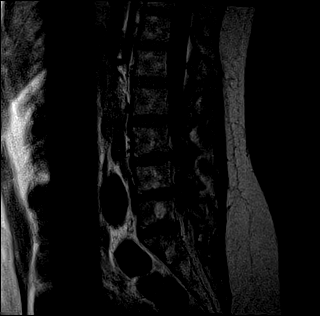
[im 11/15]
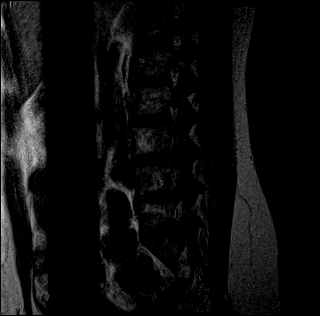
[im 15/15]
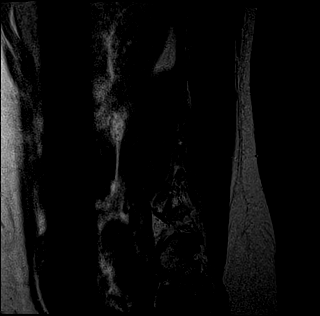

[Series 9: T2 · axial · 4.0mm · 0.70mm/px · z∈[-96,+89]mm · 11 of 34 slices shown (2 of 3)]
[im 1/34]
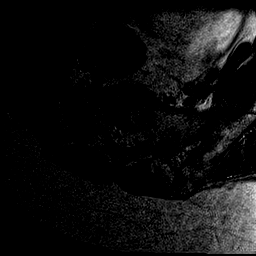
[im 4/34]
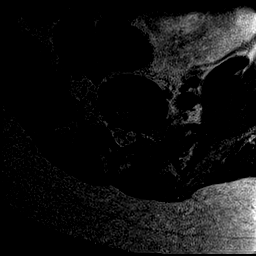
[im 7/34]
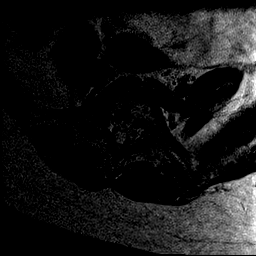
[im 10/34]
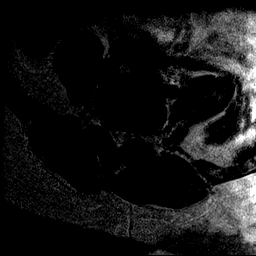
[im 14/34]
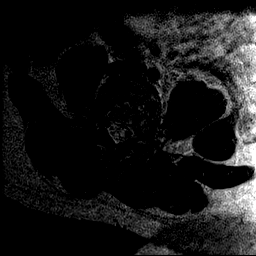
[im 17/34]
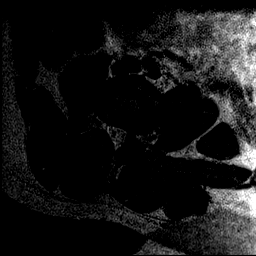
[im 20/34]
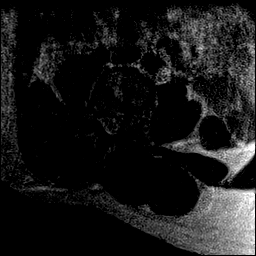
[im 24/34]
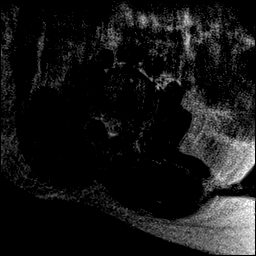
[im 27/34]
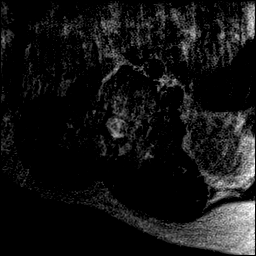
[im 30/34]
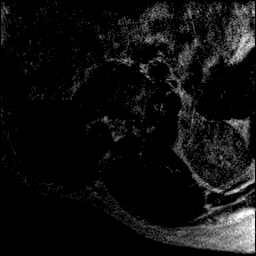
[im 34/34]
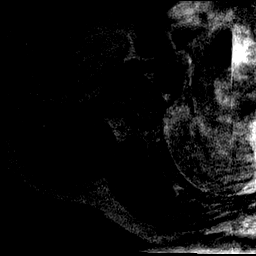

[Series 10: T1 · axial · 4.0mm · 0.78mm/px · z∈[-97,+89]mm · 11 of 34 slices shown (2 of 2)]
[im 1/34]
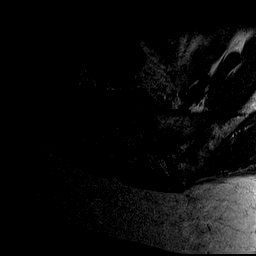
[im 4/34]
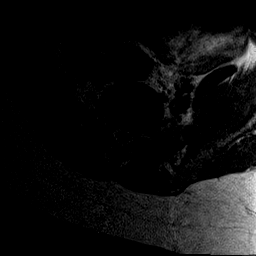
[im 7/34]
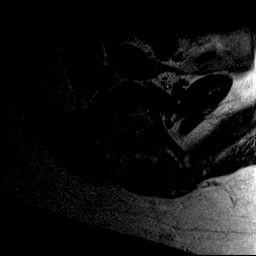
[im 10/34]
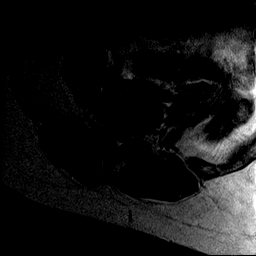
[im 14/34]
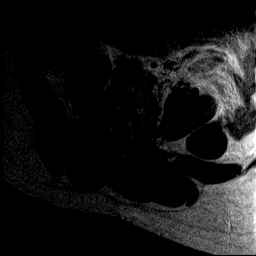
[im 17/34]
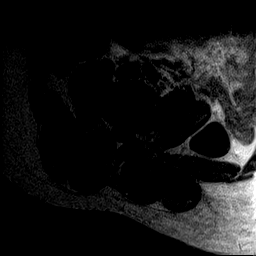
[im 20/34]
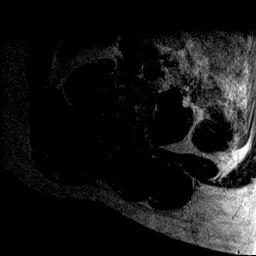
[im 24/34]
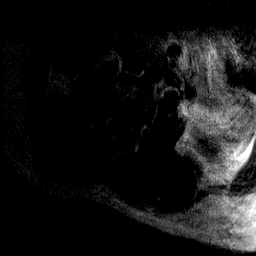
[im 27/34]
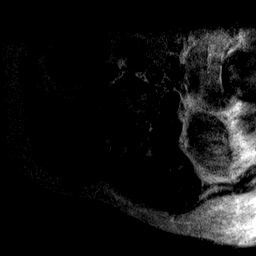
[im 30/34]
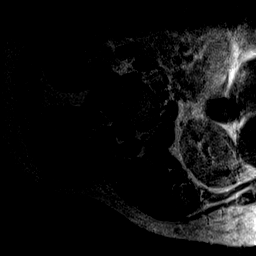
[im 34/34]
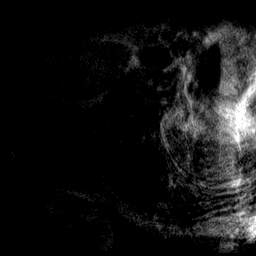

[Series 11: T2 · axial · 4.0mm · 1.04mm/px · z∈[-97,+89]mm · 11 of 34 slices shown (3 of 3)]
[im 1/34]
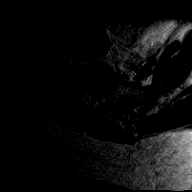
[im 4/34]
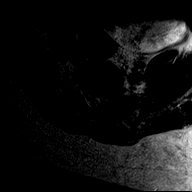
[im 7/34]
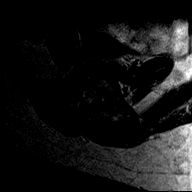
[im 10/34]
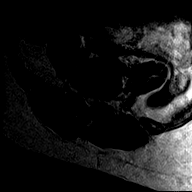
[im 14/34]
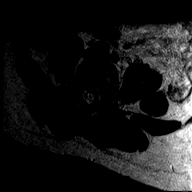
[im 17/34]
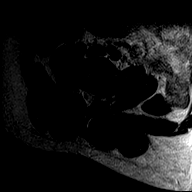
[im 20/34]
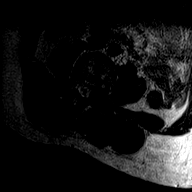
[im 24/34]
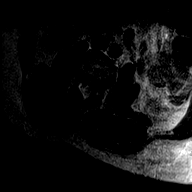
[im 27/34]
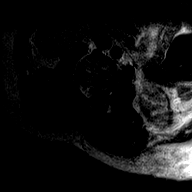
[im 30/34]
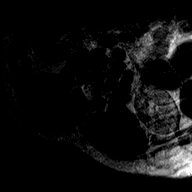
[im 34/34]
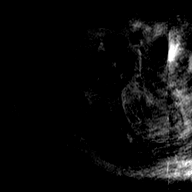

[48 of 48 positions shown; findings below may reference images not displayed]

FINDINGS: Segmentation:  5 lumbar type vertebral bodies.

Alignment:  Normal allowing for positioning.

Vertebrae:  No vertebral body lesion identified.

Conus medullaris and cauda equina: Conus extends to the T12-L1
level. Limited resolution.

Paraspinal and other soft tissues: No abnormality seen.

Disc levels:

No abnormality at L3-4 or above.

At L4-5, the patient is again seen to have a shallow right
posterolateral disc herniation with stenosis of the subarticular
lateral recess that could compress the right L5 nerve. This does not
appear to have changed.

L5-S1 level appears normal
IMPRESSION: Marked motion degradation secondary to pain. The patient was scanned
lying on her side because she could not lie on her back.

There does not appear to be any change since the previous study.
There is a small right posterolateral disc herniation at L4-5 the
does appear to cause right lateral recess stenosis and potential
compression of the right L5 nerve.

## 2019-04-25 MED ORDER — FLUTICASONE PROPIONATE 50 MCG/ACT NA SUSP: 2.0000 | Freq: Every day | NASAL | 6 refills | Status: DC

## 2019-04-25 MED ORDER — LORATADINE 10 MG PO TABS: 10.0000 mg | ORAL_TABLET | Freq: Every day | ORAL | 11 refills | Status: DC

## 2019-04-25 NOTE — Progress Notes (Signed)
   Subjective:    Patient ID: Lisa Newton, female    DOB: 09/18/1971, 48 y.o.   MRN: [id number]   CC: foul urinary odor  HPI: Malodorous Urine: urine has "old people smell", Started 3 weeks ago, coinciding with unprotected sex with new female partner. Last UA showing trace LE but negative nitrites. Patient states color is yellow to amber in color, cloudy in appearance, denies dysuria, vaginal discharge, and bladder/abdominal pains.   Allergies: patient reports she is taking Zyrtec which is not working for her. She reports having itchy/water eyes, nasal congestion, sneezing, and a sort of phlegmy build up in her mouth every morning. She denies fevers, chills, headaches, shortness of breath, chest pain, and myalgias.  Review of Systems: urinary odor, otherwise denies dysuria, suprapubic pain, vaginal discharge, abdominal pain, new back pains, and urinary frequency   Objective:  BP (!) 128/94   Pulse 95   LMP 04/02/2019 (Approximate)   SpO2 99%  Vitals and nursing note reviewed  General: well nourished, in no acute distress Cardiac: RRR, clear S1 and S2, no murmurs, rubs, or gallops Respiratory: clear to auscultation bilaterally, no increased work of breathing Abdomen: soft, nontender, nondistended, no masses or organomegaly. Bowel sounds present Extremities: no edema or cyanosis. Warm, well perfused. 2+ radial and PT pulses bilaterally Neuro: alert and oriented, no focal deficits  Assessment & Plan:   Abnormal urine odor Patient describing "stale" urine odor which started about the same time the patient had a change in her sexual partner.  - Proceed to check urine culture - Check HIV, HepC and RPR for unprotected sexual activity  Seasonal allergies Patient says Zyrtec is not working for her allergies. - d/c Zyrtec, start Loratadine 10mg  daily - Flonase daily  No follow-ups on file.  Dr. Peggyann Shoals [facility], PGY-1

## 2019-04-25 NOTE — Assessment & Plan Note (Signed)
Patient says Zyrtec is not working for her allergies. - d/c Zyrtec, start Loratadine 10mg  daily - Flonase daily

## 2019-04-25 NOTE — Assessment & Plan Note (Signed)
Patient describing "stale" urine odor which started about the same time the patient had a change in her sexual partner.  - Proceed to check urine culture - Check HIV, HepC and RPR for unprotected sexual activity

## 2019-04-26 LAB — HEPATITIS C ANTIBODY (REFLEX): HCV Ab: <0.1 s/co ratio (ref 0.0–0.9)

## 2019-04-26 LAB — HCV COMMENT:

## 2019-04-26 LAB — RPR: RPR Ser Ql: NONREACTIVE

## 2019-04-26 LAB — HIV ANTIBODY (ROUTINE TESTING W REFLEX): HIV Screen 4th Generation wRfx: NONREACTIVE

## 2019-04-27 LAB — URINE CULTURE

## 2019-04-29 ENCOUNTER — Telehealth: Payer: Self-pay

## 2019-04-29 NOTE — Telephone Encounter (Signed)
Pt LVM on nurse line requesting results from urine culture last week. Please advise.

## 2019-04-30 ENCOUNTER — Other Ambulatory Visit: Payer: Self-pay | Admitting: Family Medicine

## 2019-04-30 DIAGNOSIS — R399 Unspecified symptoms and signs involving the genitourinary system: Secondary | ICD-10-CM

## 2019-04-30 MED ORDER — NITROFURANTOIN MONOHYD MACRO 100 MG PO CAPS: 100.0000 mg | ORAL_CAPSULE | Freq: Two times a day (BID) | ORAL | 0 refills | Status: AC

## 2019-05-07 ENCOUNTER — Encounter: Payer: Self-pay | Admitting: Family Medicine

## 2019-05-07 ENCOUNTER — Ambulatory Visit (INDEPENDENT_AMBULATORY_CARE_PROVIDER_SITE_OTHER): Payer: Medicaid Other | Admitting: Family Medicine

## 2019-05-07 ENCOUNTER — Other Ambulatory Visit: Payer: Self-pay

## 2019-05-07 VITALS — BP 128/80 | HR 96

## 2019-05-07 DIAGNOSIS — G4710 Hypersomnia, unspecified: Secondary | ICD-10-CM | POA: Diagnosis not present

## 2019-05-07 DIAGNOSIS — J351 Hypertrophy of tonsils: Secondary | ICD-10-CM

## 2019-05-07 DIAGNOSIS — E038 Other specified hypothyroidism: Secondary | ICD-10-CM | POA: Insufficient documentation

## 2019-05-07 DIAGNOSIS — E559 Vitamin D deficiency, unspecified: Secondary | ICD-10-CM | POA: Diagnosis not present

## 2019-05-07 DIAGNOSIS — E039 Hypothyroidism, unspecified: Secondary | ICD-10-CM | POA: Diagnosis not present

## 2019-05-07 DIAGNOSIS — R0683 Snoring: Secondary | ICD-10-CM | POA: Diagnosis not present

## 2019-05-07 DIAGNOSIS — E785 Hyperlipidemia, unspecified: Secondary | ICD-10-CM

## 2019-05-07 DIAGNOSIS — N390 Urinary tract infection, site not specified: Secondary | ICD-10-CM

## 2019-05-07 NOTE — Patient Instructions (Signed)

## 2019-05-07 NOTE — Progress Notes (Signed)
Subjective:     Patient ID: Lisa Newton, female   DOB: [birth year], 48 y.o.   MRN: [id number]  HPI Sleep issue:C/O excessive day time sleepiness and nighttime snoring ongoing for more than six months. She is now worsening. Her daughter can't sleep well at night due to her snoring. She sleeps deep and feels she stop breathing while sleeping at times. She wakes up sometimes to catch her breath. She is concern about this since she will soon be living all by herself. UTI:She c/o urine odor for a few days. She completed A/B treatment (Nitrofurantoin) for UTI a few days ago. Before that, she was treated with Keflex. Per the patient, she has had three episodes of UTI in the past few months. She denies dysuria, no abdominal pain, no N/V. She endorsed dark urine; she has been drinking a lot of water. Hyperlipidemia: On Lipitor 10 mg qd. Here for f/u. Vitamin D deficiency:Started on Supplement by her pain specialist. Here for f/u. Hypothyroidism: On Synthroid x 3 months, started on Synthroid by her pain med specialist.  Current Outpatient Medications on File Prior to Visit  Medication Sig Dispense Refill  . atorvastatin (LIPITOR) 10 MG tablet Take 10 mg by mouth daily.    . cyclobenzaprine (FLEXERIL) 10 MG tablet Take 1 tablet (10 mg total) by mouth 3 (three) times daily as needed for muscle spasms. 90 tablet 0  . fluticasone (FLONASE) 50 MCG/ACT nasal spray Place 2 sprays into both nostrils daily. 16 g 6  . hydrochlorothiazide (HYDRODIURIL) 25 MG tablet Take 1 tablet (25 mg total) by mouth daily. 90 tablet 1  . ibuprofen (ADVIL,MOTRIN) 600 MG tablet Take 1 tablet (600 mg total) by mouth every 8 (eight) hours as needed for moderate pain. 90 tablet 0  . levothyroxine (SYNTHROID) 25 MCG tablet Take 25 mcg by mouth daily before breakfast.    . loratadine (CLARITIN) 10 MG tablet Take 1 tablet (10 mg total) by mouth daily. 30 tablet 11  . Metoprolol Succinate 25 MG CS24 Take by mouth.    .  oxyCODONE-acetaminophen (PERCOCET) 7.5-325 MG tablet Take 1 tablet by mouth every 4 (four) hours as needed for severe pain.    . pregabalin (LYRICA) 150 MG capsule Take 1 capsule (150 mg total) by mouth 3 (three) times daily. 90 capsule 0  . cholecalciferol (VITAMIN D3) 25 MCG (1000 UT) tablet Take 1,000 Units by mouth daily.    [name]. escitalopram (LEXAPRO) 20 MG tablet Take 1 tablet (20 mg total) by mouth daily. (Patient not taking: Reported on 04/02/2019) 90 tablet 1  . Olopatadine HCl (PAZEO) 0.7 % SOLN Apply 1 drop to eye daily. (Patient not taking: Reported on 04/02/2019) 2.5 mL 5  . SUMAtriptan (IMITREX) 50 MG tablet Take 1 tablet (50 mg total) by mouth every 2 (two) hours as needed for migraine. May repeat in 2 hours if headache persists or recurs. (Patient not taking: Reported on 05/07/2019) 12 tablet 6  . traMADol (ULTRAM) 50 MG tablet Take 1 tablet (50 mg total) by mouth every 6 (six) hours as needed for severe pain. 120 tablet 0  . Triamcinolone Acetonide (TRIAMCINOLONE 0.1 % CREAM : EUCERIN) CREA Apply to affected skin BID (Patient not taking: Reported on 04/02/2019) 1 each 0   No current facility-administered medications on file prior to visit.    Past Medical History:  Diagnosis Date  . Acne 03/02/2018  . Anxiety   . Arthritis   . Cervical radiculitis (Left) 12/22/2016  . Chronic headaches   .  DDD (degenerative disc disease), cervical 08/06/2018  . DDD (degenerative disc disease), lumbar 08/06/2018  . Depression 01/11/2012  . Disorder of skeletal system 07/18/2018  . DVT (deep venous thrombosis) (HCC) 04/07/2015  . 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 Hypertension   . Issue of medical certificate for disability examination 10/25/2013  . Left ear hearing loss   . Lumbar radiculitis (Bilateral) 08/06/2018  . Neck pain   . Parotitis, acute 06/11/2018  . PONV (postoperative nausea and vomiting)   . 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/2018  . Primary osteoarthritis of lumbar  spine 08/06/2018  . 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 a theatre or a meeting) 2  As a passenger in a car for an hour without a break 3  Lying down to rest in the afternoon when circumstances permit 3  Sitting and talking to someone 3  Sitting quietly after a lunch without alcohol 3  In a car, while stopped for a few minutes in traffic 0  Total score 19      Review of Systems  HENT:       Snoring  Respiratory: Positive for apnea. Negative for shortness of breath and wheezing.   Cardiovascular: Negative.   Gastrointestinal: Negative.   Genitourinary: Negative.   Neurological: Negative.   All other systems reviewed and are negative.      Objective:   Physical Exam Vitals signs and nursing note reviewed.  Constitutional:      Appearance: She is obese.  HENT:     Mouth/Throat:     Comments: Moderately enlarged tonsils Eyes:     Conjunctiva/sclera: Conjunctivae normal.     Pupils: Pupils are equal, round, and reactive to light.  Neck:     Musculoskeletal: Neck supple.  Cardiovascular:     Rate and Rhythm: Normal rate and regular rhythm.     Pulses: Normal pulses.     Heart sounds: Normal heart sounds. No murmur.  Pulmonary:     Effort: Pulmonary effort is normal. No respiratory distress.     Breath sounds: Normal breath sounds. No wheezing or rhonchi.  Abdominal:     General: Abdomen is flat. Bowel sounds are normal. There is no distension.     Palpations: Abdomen is soft. There is no mass.     Tenderness: There is no abdominal tenderness.  Musculoskeletal:     Right lower leg: No edema.     Left lower leg: No edema.  Neurological:     Mental Status: She is alert and oriented to person, place, and time.        Assessment:     Excessive  sleepiness Snoring Recurrent UTI Vit D deficiency Hypothyroidism Hyperlipidemia    Plan:     Check problem list.   NB: Patient got approved for her disability. She feels happier and less depressed with her current situation.  She will soon be moving into a new apartment as well.  Gift: She gave me a silver and black blouse. I confirmed with her that this is not expensive. I was unable to decline this gift, so that I don't hurt her. Blouse will be in my office.

## 2019-05-07 NOTE — Assessment & Plan Note (Signed)
Epworth sleep scale of 19. I recommended sleep study. Referral to sleep clinic placed. She agreed with the plan.

## 2019-05-07 NOTE — Assessment & Plan Note (Signed)
Don't have lab report from her pain specialist to support this. TSH checked today.

## 2019-05-07 NOTE — Assessment & Plan Note (Signed)
Check lab today

## 2019-05-07 NOTE — Assessment & Plan Note (Signed)
Recheck urine culture today. I will contact her with the test result.

## 2019-05-07 NOTE — Assessment & Plan Note (Signed)
R/O Sleep apnea. She also has enlarged tonsils B/L. May be contributing to snoring and Apnea. I will refer to ENT as well.

## 2019-05-07 NOTE — Assessment & Plan Note (Signed)
Don't have the lab result from her pain specialist to support diagnosis. Check lab today.

## 2019-05-08 ENCOUNTER — Telehealth: Payer: Self-pay | Admitting: Family Medicine

## 2019-05-08 LAB — LIPID PANEL
Chol/HDL Ratio: 4.4 ratio (ref 0.0–4.4)
Cholesterol, Total: 158 mg/dL (ref 100–199)
HDL: 36 mg/dL — ABNORMAL LOW (ref >39)
LDL Calculated: 97 mg/dL (ref 0–99)
Triglycerides: 125 mg/dL (ref 0–149)
VLDL Cholesterol Cal: 25 mg/dL (ref 5–40)

## 2019-05-08 LAB — TSH: TSH: 5.44 u[IU]/mL — ABNORMAL HIGH (ref 0.450–4.500)

## 2019-05-08 LAB — BASIC METABOLIC PANEL
BUN/Creatinine Ratio: 13 (ref 9–23)
BUN: 11 mg/dL (ref 6–24)
CO2: 19 mmol/L — ABNORMAL LOW (ref 20–29)
Calcium: 10.1 mg/dL (ref 8.7–10.2)
Chloride: 95 mmol/L — ABNORMAL LOW (ref 96–106)
Creatinine, Ser: 0.87 mg/dL (ref 0.57–1.00)
GFR calc Af Amer: 92 mL/min/{1.73_m2} (ref >59)
GFR calc non Af Amer: 80 mL/min/{1.73_m2} (ref >59)
Glucose: 98 mg/dL (ref 65–99)
Potassium: 3.6 mmol/L (ref 3.5–5.2)
Sodium: 137 mmol/L (ref 134–144)

## 2019-05-08 LAB — VITAMIN D 25 HYDROXY (VIT D DEFICIENCY, FRACTURES): Vit D, 25-Hydroxy: 59.9 ng/mL (ref 30.0–100.0)

## 2019-05-08 NOTE — Telephone Encounter (Signed)
appt scheduled for 06-07-2019. Alma Mohiuddin,CMA

## 2019-05-08 NOTE — Telephone Encounter (Signed)
Lab results discussed.  TSH slightly elevated. We will continue Synthroid 25 mcg for now. Repeat lab in 4 weeks.  Urine culture is still pending. I will contact her as soon as I have the result.  She mentioned that her period still has not returned. She did not tell me this during the visit yesterday. She said she told the CMA (Jazmin Hartsell). However, this was not mentioned to me.  I advised her to return to the lab for upreg, and that I should see her soon for further work-up. She said she sees her pain specialist tomorrow and will get a pregnancy test done at their office.  The pregnancy test during her last visit with me in Dec was negative. The instruction I gave her then was for her to contact me or follow-up in about two weeks if she still did not see her period, but she never did. This is could likely be menopausal.    I will message CMA to contact her for a follow-up appointment.

## 2019-05-09 ENCOUNTER — Other Ambulatory Visit: Payer: Self-pay | Admitting: Family Medicine

## 2019-05-09 ENCOUNTER — Encounter: Payer: Self-pay | Admitting: Family Medicine

## 2019-05-09 MED ORDER — LEVOFLOXACIN 750 MG PO TABS: 750.0000 mg | ORAL_TABLET | Freq: Every day | ORAL | 0 refills | Status: AC

## 2019-05-09 NOTE — Progress Notes (Signed)
See result note.  Patient informed.  Jazmin Hartsell,CMA

## 2019-05-09 NOTE — Progress Notes (Signed)
Patient's culture is positive again for Ecoli. This is the third time. She had been on Keflex, and Nitrofurantoin.  Her culture had been pan sensitive. Sensitivity is currently pending.  I will go ahead and treat with Levaquin for 7 days.  If there is another episode, she will need to be on prophylaxis.  I have messaged the blue team CMA to contact her with result instruction and treatment plan.

## 2019-05-10 LAB — URINE CULTURE

## 2019-06-07 ENCOUNTER — Other Ambulatory Visit: Payer: Self-pay

## 2019-06-07 ENCOUNTER — Other Ambulatory Visit: Payer: Self-pay | Admitting: Family Medicine

## 2019-06-07 ENCOUNTER — Encounter: Payer: Self-pay | Admitting: Family Medicine

## 2019-06-07 ENCOUNTER — Ambulatory Visit: Payer: Medicaid Other | Admitting: Family Medicine

## 2019-06-07 VITALS — BP 124/80 | HR 78 | Ht 66.0 in | Wt 250.5 lb

## 2019-06-07 DIAGNOSIS — G8929 Other chronic pain: Secondary | ICD-10-CM | POA: Diagnosis not present

## 2019-06-07 DIAGNOSIS — Z3200 Encounter for pregnancy test, result unknown: Secondary | ICD-10-CM | POA: Diagnosis not present

## 2019-06-07 DIAGNOSIS — M25562 Pain in left knee: Secondary | ICD-10-CM | POA: Diagnosis not present

## 2019-06-07 DIAGNOSIS — I10 Essential (primary) hypertension: Secondary | ICD-10-CM

## 2019-06-07 DIAGNOSIS — Z1239 Encounter for other screening for malignant neoplasm of breast: Secondary | ICD-10-CM

## 2019-06-07 DIAGNOSIS — N912 Amenorrhea, unspecified: Secondary | ICD-10-CM

## 2019-06-07 DIAGNOSIS — E039 Hypothyroidism, unspecified: Secondary | ICD-10-CM | POA: Diagnosis not present

## 2019-06-07 DIAGNOSIS — Z1231 Encounter for screening mammogram for malignant neoplasm of breast: Secondary | ICD-10-CM

## 2019-06-07 NOTE — Patient Instructions (Signed)

## 2019-06-07 NOTE — Progress Notes (Signed)
Subjective:     Patient ID: Lisa Newton, female   DOB: [birth year], 48 y.o.   MRN: [id number]  HPI Amenorrhea:Patient is yet to see her period since April 24th 2020. Denies any associated symptoms. Feels well otherwise. Knee pain:Her low back pain as well as knee pain persists. She endorsed left knee pain and swelling for which she got a steroid recently which helped although the pain is still there. She requested a DMV handicap form to be completed for her. Hypothyroidism: Compliant with her med. Here for lab f/u.  Current Outpatient Medications on File Prior to Visit  Medication Sig Dispense Refill  . atorvastatin (LIPITOR) 10 MG tablet Take 10 mg by mouth daily.    . cholecalciferol (VITAMIN D3) 25 MCG (1000 UT) tablet Take 1,000 Units by mouth daily.    . cyclobenzaprine (FLEXERIL) 10 MG tablet Take 1 tablet (10 mg total) by mouth 3 (three) times daily as needed for muscle spasms. 90 tablet 0  . escitalopram (LEXAPRO) 20 MG tablet Take 1 tablet (20 mg total) by mouth daily. (Patient not taking: Reported on 04/02/2019) 90 tablet 1  . fluticasone (FLONASE) 50 MCG/ACT nasal spray Place 2 sprays into both nostrils daily. 16 g 6  . hydrochlorothiazide (HYDRODIURIL) 25 MG tablet Take 1 tablet (25 mg total) by mouth daily. 90 tablet 1  . ibuprofen (ADVIL,MOTRIN) 600 MG tablet Take 1 tablet (600 mg total) by mouth every 8 (eight) hours as needed for moderate pain. 90 tablet 0  . levothyroxine (SYNTHROID) 25 MCG tablet Take 25 mcg by mouth daily before breakfast.    . loratadine (CLARITIN) 10 MG tablet Take 1 tablet (10 mg total) by mouth daily. 30 tablet 11  . Metoprolol Succinate 25 MG CS24 Take by mouth.    . Olopatadine HCl (PAZEO) 0.7 % SOLN Apply 1 drop to eye daily. (Patient not taking: Reported on 04/02/2019) 2.5 mL 5  . oxyCODONE-acetaminophen (PERCOCET) 7.5-325 MG tablet Take 1 tablet by mouth every 4 (four) hours as needed for severe pain.    . pregabalin (LYRICA) 150 MG capsule Take 1  capsule (150 mg total) by mouth 3 (three) times daily. 90 capsule 0  . SUMAtriptan (IMITREX) 50 MG tablet Take 1 tablet (50 mg total) by mouth every 2 (two) hours as needed for migraine. May repeat in 2 hours if headache persists or recurs. (Patient not taking: Reported on 05/07/2019) 12 tablet 6  . traMADol (ULTRAM) 50 MG tablet Take 1 tablet (50 mg total) by mouth every 6 (six) hours as needed for severe pain. 120 tablet 0  . Triamcinolone Acetonide (TRIAMCINOLONE 0.1 % CREAM : EUCERIN) CREA Apply to affected skin BID (Patient not taking: Reported on 04/02/2019) 1 each 0   No current facility-administered medications on file prior to visit.    Past Medical History:  Diagnosis Date  . Acne 03/02/2018  . Anxiety   . Arthritis   . Cervical radiculitis (Left) 12/22/2016  . Chronic headaches   . DDD (degenerative disc disease), cervical 08/06/2018  . DDD (degenerative disc disease), lumbar 08/06/2018  . Depression 01/11/2012  . Disorder of skeletal system 07/18/2018  . 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 Hypertension   . Issue of medical certificate for disability examination 10/25/2013  . Left ear hearing loss   . Lumbar radiculitis (Bilateral) 08/06/2018  . Neck pain   . Parotitis, acute 06/11/2018  . PONV (postoperative nausea and vomiting)   . 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/2018  . Primary  osteoarthritis of lumbar spine 08/06/2018  . 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   Respiratory: Negative.   Cardiovascular: Negative.   Musculoskeletal: Positive for arthralgias and joint swelling.       Left knee pain  All other systems reviewed and are negative.      Objective:   Physical Exam Vitals signs and nursing note reviewed.  Constitutional:      Appearance: She is not ill-appearing.  Cardiovascular:     Rate and Rhythm: Normal rate and regular rhythm.     Pulses: Normal pulses.     Heart sounds: Normal  heart sounds. No murmur.  Pulmonary:     Effort: Pulmonary effort is normal. No respiratory distress.     Breath sounds: Normal breath sounds. No wheezing.  Abdominal:     General: Bowel sounds are normal. There is no distension.     Palpations: Abdomen is soft. There is no mass.     Tenderness: There is no abdominal tenderness.  Musculoskeletal:     Right knee: Normal.     Left knee: She exhibits normal range of motion, no swelling and no erythema.  Neurological:     Mental Status: She is alert and oriented to person, place, and time.        Assessment:     Amenorrhea Left knee pain Hypothyroidism    Plan:     Check problem list.  For health maintenance. We gave her a mammogram slip and I also ordered test.

## 2019-06-08 NOTE — Assessment & Plan Note (Signed)
I checked her TSH today as planned. I will contact her with the result.

## 2019-06-08 NOTE — Assessment & Plan Note (Signed)
Receiving pain management with ortho group. I completed her DMV form today. F?U as needed.

## 2019-06-08 NOTE — Assessment & Plan Note (Signed)
Going into the third month. Likely menopausal. She was unable to give her urine for upreg. Serum BHCG checked today. I also obtained labs for TSH, LH/FSH and DHEAS. I will contact her with results.

## 2019-06-10 ENCOUNTER — Telehealth: Payer: Self-pay | Admitting: Family Medicine

## 2019-06-10 DIAGNOSIS — R7989 Other specified abnormal findings of blood chemistry: Secondary | ICD-10-CM

## 2019-06-10 DIAGNOSIS — E039 Hypothyroidism, unspecified: Secondary | ICD-10-CM

## 2019-06-10 NOTE — Telephone Encounter (Signed)
Lab result discussed with the patient. TSH still elevated despite compliance. I recommended increasing her Synthroid dose from 25 mcg to 50 mcg. She agreed with the plan.   Prolactin level is high. SHe is not on any meds that will cause this.  No vision issue. She has hx of Migraine headache as well as amenorrhea.   R/O Prolactinoma. Could also be related to hypothyroidism which she is already on Synthroid.  I recommended further evaluation with lab work which is scheduled for tomorrow.  Consider imaging in the future pending lab results. We will also recheck prolactin level.  She will let me know if anything changes with her symptoms.

## 2019-06-11 ENCOUNTER — Other Ambulatory Visit: Payer: Medicaid Other

## 2019-06-11 ENCOUNTER — Other Ambulatory Visit: Payer: Self-pay

## 2019-06-11 DIAGNOSIS — E039 Hypothyroidism, unspecified: Secondary | ICD-10-CM

## 2019-06-11 DIAGNOSIS — R7989 Other specified abnormal findings of blood chemistry: Secondary | ICD-10-CM

## 2019-06-12 ENCOUNTER — Telehealth: Payer: Self-pay | Admitting: Family Medicine

## 2019-06-12 LAB — CMP14+EGFR
ALT: 20 IU/L (ref 0–32)
AST: 20 IU/L (ref 0–40)
Albumin/Globulin Ratio: 1.7 (ref 1.2–2.2)
Albumin: 4.3 g/dL (ref 3.8–4.8)
Alkaline Phosphatase: 76 IU/L (ref 39–117)
BUN/Creatinine Ratio: 9 (ref 9–23)
BUN: 7 mg/dL (ref 6–24)
Bilirubin Total: 0.2 mg/dL (ref 0.0–1.2)
CO2: 24 mmol/L (ref 20–29)
Calcium: 9.5 mg/dL (ref 8.7–10.2)
Chloride: 96 mmol/L (ref 96–106)
Creatinine, Ser: 0.75 mg/dL (ref 0.57–1.00)
GFR calc Af Amer: 110 mL/min/{1.73_m2} (ref >59)
GFR calc non Af Amer: 95 mL/min/{1.73_m2} (ref >59)
Globulin, Total: 2.6 g/dL (ref 1.5–4.5)
Glucose: 80 mg/dL (ref 65–99)
Potassium: 3.5 mmol/L (ref 3.5–5.2)
Sodium: 135 mmol/L (ref 134–144)
Total Protein: 6.9 g/dL (ref 6.0–8.5)

## 2019-06-12 LAB — FSH/LH
FSH: 2.3 m[IU]/mL
LH: 2.7 m[IU]/mL

## 2019-06-12 LAB — TSH: TSH: 5.79 u[IU]/mL — ABNORMAL HIGH (ref 0.450–4.500)

## 2019-06-12 LAB — CORTISOL-AM, BLOOD: Cortisol - AM: 6.9 ug/dL (ref 6.2–19.4)

## 2019-06-12 LAB — T3, FREE: T3, Free: 2.5 pg/mL (ref 2.0–4.4)

## 2019-06-12 LAB — HCG, SERUM, QUALITATIVE: hCG,Beta Subunit,Qual,Serum: NEGATIVE m[IU]/mL (ref <6)

## 2019-06-12 LAB — T4, FREE: Free T4: 0.94 ng/dL (ref 0.82–1.77)

## 2019-06-12 LAB — PROLACTIN
Prolactin: 51.9 ng/mL — ABNORMAL HIGH (ref 4.8–23.3)
Prolactin: 23.7 ng/mL — ABNORMAL HIGH (ref 4.8–23.3)

## 2019-06-12 LAB — ACTH: ACTH: 39.1 pg/mL (ref 7.2–63.3)

## 2019-06-12 LAB — DHEA: Dehydroepiandrosterone: 171 ng/dL (ref 31–701)

## 2019-06-12 NOTE — Telephone Encounter (Signed)
I discussed test result with her. Prolactin level came down. Likely related to her thyroid disease.  Other results looks fine. No further eval at this point. She is advised to contact me if she develops new headache, change in vision or gait disturbance. She agreed with the plan.

## 2019-07-23 ENCOUNTER — Other Ambulatory Visit: Payer: Self-pay

## 2019-07-23 ENCOUNTER — Ambulatory Visit
Admission: RE | Admit: 2019-07-23 | Discharge: 2019-07-23 | Disposition: A | Discharge status: Home / Self Care | Payer: Medicare HMO | Source: Ambulatory Visit | Attending: Family Medicine | Admitting: Family Medicine

## 2019-07-23 ENCOUNTER — Telehealth: Payer: Self-pay | Admitting: Family Medicine

## 2019-07-23 DIAGNOSIS — N6320 Unspecified lump in the left breast, unspecified quadrant: Secondary | ICD-10-CM

## 2019-07-23 DIAGNOSIS — Z1231 Encounter for screening mammogram for malignant neoplasm of breast: Secondary | ICD-10-CM

## 2019-07-23 NOTE — Telephone Encounter (Signed)
Result reviewed and discussed with the patient. Need for diagnostic mammogram discussed. I will place the order. I advised her to call the breast imaging center tomorrow to schedule her follow-up appointment. She agreed with the plan.

## 2019-07-24 ENCOUNTER — Other Ambulatory Visit: Payer: Self-pay | Admitting: Family Medicine

## 2019-07-24 DIAGNOSIS — R928 Other abnormal and inconclusive findings on diagnostic imaging of breast: Secondary | ICD-10-CM

## 2019-07-31 ENCOUNTER — Other Ambulatory Visit: Payer: Medicare HMO

## 2019-08-05 ENCOUNTER — Telehealth: Payer: Self-pay | Admitting: *Deleted

## 2019-08-05 ENCOUNTER — Ambulatory Visit
Admission: RE | Admit: 2019-08-05 | Discharge: 2019-08-05 | Disposition: A | Discharge status: Home / Self Care | Payer: Medicare HMO | Source: Ambulatory Visit | Attending: Family Medicine | Admitting: Family Medicine

## 2019-08-05 ENCOUNTER — Other Ambulatory Visit: Payer: Self-pay | Admitting: Family Medicine

## 2019-08-05 ENCOUNTER — Other Ambulatory Visit: Payer: Self-pay

## 2019-08-05 DIAGNOSIS — R928 Other abnormal and inconclusive findings on diagnostic imaging of breast: Secondary | ICD-10-CM

## 2019-08-05 DIAGNOSIS — N6320 Unspecified lump in the left breast, unspecified quadrant: Secondary | ICD-10-CM

## 2019-08-05 DIAGNOSIS — N6321 Unspecified lump in the left breast, upper outer quadrant: Secondary | ICD-10-CM | POA: Diagnosis not present

## 2019-08-05 NOTE — Telephone Encounter (Signed)
Pt wanted to know if her insurance would cover water PT.  Advised that she would have to call her insurance and check on that.  Pt is reluctant but will call and check tomorrow. Christen Bame, CMA

## 2019-08-07 ENCOUNTER — Other Ambulatory Visit: Payer: Self-pay

## 2019-08-07 ENCOUNTER — Ambulatory Visit (INDEPENDENT_AMBULATORY_CARE_PROVIDER_SITE_OTHER): Payer: Self-pay | Admitting: *Deleted

## 2019-08-07 DIAGNOSIS — Z111 Encounter for screening for respiratory tuberculosis: Secondary | ICD-10-CM

## 2019-08-07 NOTE — Progress Notes (Signed)
Patient is here for a PPD placement.  PPD placed in Left forearm.  Patient will return 08/09/19 to have PPD read. Christen Bame, CMA

## 2019-08-08 ENCOUNTER — Ambulatory Visit
Admission: RE | Admit: 2019-08-08 | Discharge: 2019-08-08 | Disposition: A | Discharge status: Home / Self Care | Payer: Medicare HMO | Source: Ambulatory Visit | Attending: Family Medicine | Admitting: Family Medicine

## 2019-08-08 DIAGNOSIS — N6012 Diffuse cystic mastopathy of left breast: Secondary | ICD-10-CM | POA: Diagnosis not present

## 2019-08-08 DIAGNOSIS — E559 Vitamin D deficiency, unspecified: Secondary | ICD-10-CM | POA: Diagnosis not present

## 2019-08-08 DIAGNOSIS — M150 Primary generalized (osteo)arthritis: Secondary | ICD-10-CM | POA: Diagnosis not present

## 2019-08-08 DIAGNOSIS — R300 Dysuria: Secondary | ICD-10-CM | POA: Diagnosis not present

## 2019-08-08 DIAGNOSIS — Z79899 Other long term (current) drug therapy: Secondary | ICD-10-CM | POA: Diagnosis not present

## 2019-08-08 DIAGNOSIS — I10 Essential (primary) hypertension: Secondary | ICD-10-CM | POA: Diagnosis not present

## 2019-08-08 DIAGNOSIS — N6321 Unspecified lump in the left breast, upper outer quadrant: Secondary | ICD-10-CM | POA: Diagnosis not present

## 2019-08-08 DIAGNOSIS — N390 Urinary tract infection, site not specified: Secondary | ICD-10-CM | POA: Diagnosis not present

## 2019-08-08 DIAGNOSIS — N6320 Unspecified lump in the left breast, unspecified quadrant: Secondary | ICD-10-CM

## 2019-08-08 DIAGNOSIS — M545 Low back pain: Secondary | ICD-10-CM | POA: Diagnosis not present

## 2019-08-08 DIAGNOSIS — Z79891 Long term (current) use of opiate analgesic: Secondary | ICD-10-CM | POA: Diagnosis not present

## 2019-08-08 DIAGNOSIS — Z0000 Encounter for general adult medical examination without abnormal findings: Secondary | ICD-10-CM | POA: Diagnosis not present

## 2019-08-08 DIAGNOSIS — E782 Mixed hyperlipidemia: Secondary | ICD-10-CM | POA: Diagnosis not present

## 2019-08-08 DIAGNOSIS — G894 Chronic pain syndrome: Secondary | ICD-10-CM | POA: Diagnosis not present

## 2019-08-08 DIAGNOSIS — E119 Type 2 diabetes mellitus without complications: Secondary | ICD-10-CM | POA: Diagnosis not present

## 2019-08-09 ENCOUNTER — Ambulatory Visit: Payer: Medicare HMO

## 2019-09-09 DIAGNOSIS — G894 Chronic pain syndrome: Secondary | ICD-10-CM | POA: Diagnosis not present

## 2019-09-09 DIAGNOSIS — M545 Low back pain: Secondary | ICD-10-CM | POA: Diagnosis not present

## 2019-09-09 DIAGNOSIS — Z79899 Other long term (current) drug therapy: Secondary | ICD-10-CM | POA: Diagnosis not present

## 2019-09-09 DIAGNOSIS — I10 Essential (primary) hypertension: Secondary | ICD-10-CM | POA: Diagnosis not present

## 2019-10-10 ENCOUNTER — Other Ambulatory Visit: Payer: Self-pay | Admitting: Neurology

## 2019-10-10 DIAGNOSIS — G894 Chronic pain syndrome: Secondary | ICD-10-CM | POA: Diagnosis not present

## 2019-10-10 DIAGNOSIS — M545 Low back pain: Secondary | ICD-10-CM | POA: Diagnosis not present

## 2019-10-10 DIAGNOSIS — Z79899 Other long term (current) drug therapy: Secondary | ICD-10-CM | POA: Diagnosis not present

## 2019-10-31 DIAGNOSIS — M545 Low back pain: Secondary | ICD-10-CM | POA: Diagnosis not present

## 2019-10-31 DIAGNOSIS — G894 Chronic pain syndrome: Secondary | ICD-10-CM | POA: Diagnosis not present

## 2019-10-31 DIAGNOSIS — Z79899 Other long term (current) drug therapy: Secondary | ICD-10-CM | POA: Diagnosis not present

## 2019-11-25 ENCOUNTER — Other Ambulatory Visit: Payer: Self-pay | Admitting: Neurology

## 2019-11-25 NOTE — Telephone Encounter (Signed)
Refilled by Dr Krista Blue with note to pharmacy: must keep FU in March 2021 to get further refills.

## 2019-11-25 NOTE — Telephone Encounter (Signed)
1) Medication(s) Requested (by name): SUMAtriptan (IMITREX) 50 MG tabl  2) Pharmacy of Choice:  [id number] - [place], [place] - [address]  [address]   Patient scheduled apt for next available march 8th @7 :30

## 2019-11-29 ENCOUNTER — Ambulatory Visit (INDEPENDENT_AMBULATORY_CARE_PROVIDER_SITE_OTHER): Payer: Medicare HMO | Admitting: Family Medicine

## 2019-11-29 ENCOUNTER — Other Ambulatory Visit: Payer: Self-pay

## 2019-11-29 ENCOUNTER — Encounter: Payer: Self-pay | Admitting: Family Medicine

## 2019-11-29 VITALS — BP 140/78 | HR 84 | Temp 98.3°F | Ht 66.0 in | Wt 226.1 lb

## 2019-11-29 DIAGNOSIS — G43901 Migraine, unspecified, not intractable, with status migrainosus: Secondary | ICD-10-CM | POA: Diagnosis not present

## 2019-11-29 MED ORDER — SUMATRIPTAN SUCCINATE 100 MG PO TABS: 100.0000 mg | ORAL_TABLET | ORAL | 0 refills | Status: DC | PRN

## 2019-11-29 MED ORDER — KETOROLAC TROMETHAMINE 60 MG/2ML IM SOLN
30.0000 mg | Freq: Once | INTRAMUSCULAR | Status: AC
  Administered 2019-11-29: 17:00:00 30 mg via INTRAMUSCULAR

## 2019-11-29 MED ORDER — SUMATRIPTAN SUCCINATE 6 MG/0.5ML ~~LOC~~ SOLN
6.0000 mg | Freq: Once | SUBCUTANEOUS | Status: AC
  Administered 2019-11-29: 17:00:00 6 mg via SUBCUTANEOUS

## 2019-11-29 MED ORDER — PROCHLORPERAZINE MALEATE 10 MG PO TABS: 10.0000 mg | ORAL_TABLET | Freq: Four times a day (QID) | ORAL | 0 refills | Status: DC | PRN

## 2019-11-29 MED ORDER — DEXAMETHASONE SODIUM PHOSPHATE 10 MG/ML IJ SOLN
10.0000 mg | Freq: Once | INTRAMUSCULAR | Status: AC
  Administered 2019-11-29: 17:00:00 10 mg via INTRAMUSCULAR

## 2019-11-29 NOTE — Progress Notes (Signed)
Subjective:  Lisa Newton is a 48 y.o. female who presents to the [facility] today with a chief complaint of worsening headache.   HPI:  Patient has a history of migraines.  This started roughly 2-16 after she had left ear cholesteatoma status post resection.  Patient last followed with ENT in 2019 for this.  Patient also has followed up with neurology for migraines.  She is been on Imitrex for abortion.  She takes 50 mg twice daily for the past week without improvement in her headache.  She also has chronic pain for which she takes meloxicam and oxycodone.  She is not taking meloxicam or ibuprofen in 1 week.  She said that oxycodone does not help with her pain.  Patient has been on propranolol in the past for prophylaxis, but is not taking it now.  Patient has a scheduled follow-up with neurology in 3 months, says she cannot get in any sooner.  Does endorse some nausea that is intermittent.  Vomiting x1 approximately 5 days ago.  Nonbloody nonbilious.  Denies any abdominal symptoms.  Denies any blurry vision, difficulty swallowing, confusion, facial droop.  Last imaging of her brain was in 2016.  Did show cholesteatoma, no other masses.  ROS: Per HPI  PMH: Smoking history reviewed.    Objective:  Physical Exam: BP 140/78   Pulse 84   Temp 98.3 F (36.8 C) (Oral)   Ht 5\' 6"  (1.676 m)   Wt 226 lb 2 oz (102.6 kg)   LMP 11/12/2019   SpO2 97%   BMI 36.50 kg/m   Gen: NAD, resting comfortably HEENT: PERRLA, EOMI, tympanic membranes normal bilaterally, no TMJ pain, left TM normal  CV: RRR with no murmurs appreciated Pulm: NWOB,  GI: Soft, Nontender, Nondistended. MSK: no edema, cyanosis, or clubbing noted Skin: warm, dry Neuro: grossly normal, moves all extremities Psych: Normal affect and thought content  No results found for this or any previous visit (from the past 72 hour(s)).   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  Patient does have follow-up with neurology, but is pushed out for several months.  Patient has no improvement on sumatriptan.  Abortive therapies given subcu sumatriptan, Toradol, dexamethasone.  Prescription for Compazine and sumatriptan refill.  Patient follow-up if no improvement.  Return precautions given to go to the emergency department.  Once acute headache under control, recommend patient get repeat head imaging to rule out recurrence of cholesteatoma versus other intracranial mass to rule out malignancy.    Lab Orders  No laboratory test(s) ordered today    Meds ordered this encounter  Medications  . SUMAtriptan (IMITREX) injection 6 mg  . ketorolac (TORADOL) injection 30 mg  . dexamethasone (DECADRON) injection 10 mg  . prochlorperazine (COMPAZINE) 10 MG tablet    Sig: Take 1 tablet (10 mg total) by mouth every 6 (six) hours as needed for nausea or vomiting.    Dispense:  30 tablet    Refill:  0  . SUMAtriptan (IMITREX) 100 MG tablet    Sig: Take 1 tablet (100 mg total) by mouth every 2 (two) hours as needed for migraine. May repeat in 2 hours if headache persists or recurs.    Dispense:  10 tablet    Refill:  0      Marny Lowenstein, MD, MS FAMILY MEDICINE RESIDENT - PGY3 11/29/2019 4:55 PM

## 2019-11-29 NOTE — Patient Instructions (Addendum)
It was a pleasure to see you today! Thank you for choosing [facility] for your primary care. Franne Grip was seen for migraine headache.  We gave you a migraine cocktail of Toradol, Imitrex, and dexamethasone.  We have also written you prescription for Compazine.  I have also refilled your Imitrex, although insurance may not cover this refill prescription.  Please follow-up if you do not have any improvement headache.  Please go to the emergency department if you do not have any improvement in your headache, develop nausea or vomiting, develop any vision change or any other worrisome symptom.  Best,  Marny Lowenstein, MD, MS FAMILY MEDICINE RESIDENT - PGY3 11/29/2019 3:23 PM

## 2019-11-29 NOTE — Assessment & Plan Note (Signed)
Patient with a history of chronic migraines presents with status migrainosus, possibly from medication overuse headache given history of chronic pain treated with opioids.  Patient does have follow-up with neurology, but is pushed out for several months.  Patient has no improvement on sumatriptan.  Abortive therapies given subcu sumatriptan, Toradol, dexamethasone.  Prescription for Compazine and sumatriptan refill.  Patient follow-up if no improvement.  Return precautions given to go to the emergency department.  Once acute headache under control, recommend patient get repeat head imaging to rule out recurrence of cholesteatoma versus other intracranial mass to rule out malignancy.

## 2019-12-09 DIAGNOSIS — G894 Chronic pain syndrome: Secondary | ICD-10-CM | POA: Diagnosis not present

## 2019-12-09 DIAGNOSIS — Z79899 Other long term (current) drug therapy: Secondary | ICD-10-CM | POA: Diagnosis not present

## 2019-12-09 DIAGNOSIS — M545 Low back pain: Secondary | ICD-10-CM | POA: Diagnosis not present

## 2019-12-21 ENCOUNTER — Other Ambulatory Visit: Payer: Self-pay | Admitting: Family Medicine

## 2019-12-21 DIAGNOSIS — G43901 Migraine, unspecified, not intractable, with status migrainosus: Secondary | ICD-10-CM

## 2019-12-23 ENCOUNTER — Other Ambulatory Visit: Payer: Self-pay | Admitting: Family Medicine

## 2019-12-23 MED ORDER — SUMATRIPTAN SUCCINATE 50 MG PO TABS: ORAL_TABLET | ORAL | 1 refills | Status: DC

## 2020-01-08 DIAGNOSIS — E039 Hypothyroidism, unspecified: Secondary | ICD-10-CM | POA: Diagnosis not present

## 2020-01-08 DIAGNOSIS — G894 Chronic pain syndrome: Secondary | ICD-10-CM | POA: Diagnosis not present

## 2020-01-08 DIAGNOSIS — M545 Low back pain: Secondary | ICD-10-CM | POA: Diagnosis not present

## 2020-01-08 DIAGNOSIS — Z79899 Other long term (current) drug therapy: Secondary | ICD-10-CM | POA: Diagnosis not present

## 2020-02-05 ENCOUNTER — Telehealth: Payer: Self-pay

## 2020-02-05 NOTE — Telephone Encounter (Signed)
Patient calls nurse line stating she landed herself a part time job with [employer]. Patient stated with her 2 back surgeries she needs a provider note for light duty work. Patient stated the note needs to say she can only handle <25lbs, remain part time, and light duty. Patient stated she does not want to make an apt at this time, as she wants to get to work. Please advise. She will pick up once written.

## 2020-02-05 NOTE — Telephone Encounter (Signed)
Please advise her that she will need that note to come from her spine specialist who evaluated her and completed her surgery.

## 2020-02-06 NOTE — Telephone Encounter (Signed)
Patient informed. 

## 2020-02-10 ENCOUNTER — Other Ambulatory Visit: Payer: Self-pay

## 2020-02-10 ENCOUNTER — Ambulatory Visit (INDEPENDENT_AMBULATORY_CARE_PROVIDER_SITE_OTHER): Payer: Medicare HMO

## 2020-02-10 DIAGNOSIS — Z111 Encounter for screening for respiratory tuberculosis: Secondary | ICD-10-CM

## 2020-02-10 NOTE — Progress Notes (Signed)
Patient is here for a PPD placement.  It was placed on 02/10/2020 in the left forearm @ 1030 am. Site unremarkable. Wheel noted.     Nurse visit scheduled for Wednesday, March 3rd at 1030 for reading.  Veronda Prude, RN

## 2020-02-12 ENCOUNTER — Ambulatory Visit: Payer: Medicare HMO

## 2020-02-12 ENCOUNTER — Other Ambulatory Visit: Payer: Self-pay

## 2020-02-12 DIAGNOSIS — Z111 Encounter for screening for respiratory tuberculosis: Secondary | ICD-10-CM

## 2020-02-12 LAB — TB SKIN TEST
Induration: 0 mm
TB Skin Test: NEGATIVE

## 2020-02-12 NOTE — Progress Notes (Signed)
PPD Reading Note PPD read and results entered in EpicCare. Result: 0 mm induration. Interpretation: negative If test not read within 48-72 hours of initial placement, patient advised to repeat in other arm 1-3 weeks after this test. Allergic reaction: no  Of note, there did appear to be a slight "hump." I precepted with Manson Passey who checked patient. Normal TB read. Letter given to patient for her employer.

## 2020-02-17 ENCOUNTER — Ambulatory Visit: Payer: Medicaid Other | Admitting: Neurology

## 2020-02-17 ENCOUNTER — Telehealth: Payer: Self-pay | Admitting: *Deleted

## 2020-02-17 ENCOUNTER — Encounter: Payer: Self-pay | Admitting: Neurology

## 2020-02-17 NOTE — Telephone Encounter (Signed)
No showed follow up appointment. 

## 2020-03-03 ENCOUNTER — Encounter: Payer: Self-pay | Admitting: Family Medicine

## 2020-03-04 ENCOUNTER — Encounter: Payer: Self-pay | Admitting: Family Medicine

## 2020-03-04 ENCOUNTER — Other Ambulatory Visit: Payer: Self-pay

## 2020-03-04 ENCOUNTER — Telehealth (INDEPENDENT_AMBULATORY_CARE_PROVIDER_SITE_OTHER): Payer: Medicare HMO | Admitting: Family Medicine

## 2020-03-04 NOTE — Progress Notes (Signed)
[facility] Telemedicine Visit  Patient consented to have virtual visit. Method of visit: Telephone  Encounter participants: Patient: Lisa Newton - located at Home Provider: Janit Pagan - located at Office Others (if applicable):N/A  Chief Complaint:  Sinus problem  HPI: I was able to reach her at 9:17 AM.  I was going over a medication list when her phone line started going in and out. She asked if she can reschedule the appointment and cut off the line. I attempted to reach her back, but her phone was off. I f/u with her when she reschedules. Call ended at 9:21 AM  ROS: per HPI  Pertinent PMHx: N/A  Exam:  Respiratory: N/A  Assessment/Plan:  No problem-specific Assessment & Plan notes found for this encounter.    Time spent during visit with patient: 4 minutes

## 2020-03-04 NOTE — Progress Notes (Signed)
Attempted to reach the patient. I will call back in about 10-15 minutes.

## 2020-03-04 NOTE — Progress Notes (Signed)
[id number] - [place], [place] - [address]  T- n/a  Bp- n/a  Wt- n/a  Pt stated that she has lost a lot of weight. Aquilla Solian, CMA

## 2020-03-10 ENCOUNTER — Ambulatory Visit: Payer: Medicare HMO | Admitting: Family Medicine

## 2020-04-06 DIAGNOSIS — S39012A Strain of muscle, fascia and tendon of lower back, initial encounter: Secondary | ICD-10-CM | POA: Diagnosis not present

## 2020-04-20 DIAGNOSIS — Z03818 Encounter for observation for suspected exposure to other biological agents ruled out: Secondary | ICD-10-CM | POA: Diagnosis not present

## 2020-05-05 DIAGNOSIS — H5203 Hypermetropia, bilateral: Secondary | ICD-10-CM | POA: Diagnosis not present

## 2020-05-08 ENCOUNTER — Emergency Department (HOSPITAL_COMMUNITY): Admission: EM | Admit: 2020-05-08 | Discharge: 2020-05-08 | Discharge status: Absent without leave | Payer: Medicare HMO

## 2020-05-08 DIAGNOSIS — R52 Pain, unspecified: Secondary | ICD-10-CM | POA: Diagnosis not present

## 2020-05-08 DIAGNOSIS — M25519 Pain in unspecified shoulder: Secondary | ICD-10-CM | POA: Diagnosis not present

## 2020-05-08 NOTE — ED Triage Notes (Signed)
Per EMS-lifting laundry basket starting having pain in left shoulder and back-history of back and neck surgery

## 2020-05-12 ENCOUNTER — Ambulatory Visit: Payer: Medicare HMO

## 2020-05-26 ENCOUNTER — Encounter: Payer: Medicaid Other | Admitting: Family Medicine

## 2020-05-26 ENCOUNTER — Encounter: Payer: Self-pay | Admitting: Family Medicine

## 2020-05-27 ENCOUNTER — Other Ambulatory Visit: Payer: Self-pay

## 2020-05-27 ENCOUNTER — Ambulatory Visit (INDEPENDENT_AMBULATORY_CARE_PROVIDER_SITE_OTHER): Payer: Medicare HMO

## 2020-05-27 ENCOUNTER — Ambulatory Visit (HOSPITAL_COMMUNITY)
Admission: EM | Admit: 2020-05-27 | Discharge: 2020-05-27 | Disposition: A | Discharge status: Home / Self Care | Payer: Medicare HMO | Attending: Emergency Medicine | Admitting: Emergency Medicine

## 2020-05-27 ENCOUNTER — Encounter (HOSPITAL_COMMUNITY): Payer: Self-pay | Admitting: Emergency Medicine

## 2020-05-27 DIAGNOSIS — R509 Fever, unspecified: Secondary | ICD-10-CM

## 2020-05-27 DIAGNOSIS — G8929 Other chronic pain: Secondary | ICD-10-CM

## 2020-05-27 DIAGNOSIS — R062 Wheezing: Secondary | ICD-10-CM | POA: Diagnosis not present

## 2020-05-27 DIAGNOSIS — R05 Cough: Secondary | ICD-10-CM | POA: Diagnosis not present

## 2020-05-27 DIAGNOSIS — L2489 Irritant contact dermatitis due to other agents: Secondary | ICD-10-CM | POA: Diagnosis not present

## 2020-05-27 DIAGNOSIS — J069 Acute upper respiratory infection, unspecified: Secondary | ICD-10-CM

## 2020-05-27 DIAGNOSIS — R0602 Shortness of breath: Secondary | ICD-10-CM

## 2020-05-27 DIAGNOSIS — Z20822 Contact with and (suspected) exposure to covid-19: Secondary | ICD-10-CM | POA: Diagnosis not present

## 2020-05-27 DIAGNOSIS — J302 Other seasonal allergic rhinitis: Secondary | ICD-10-CM | POA: Diagnosis not present

## 2020-05-27 MED ORDER — PAZEO 0.7 % OP SOLN: 1.0000 [drp] | Freq: Every day | OPHTHALMIC | 5 refills | Status: DC

## 2020-05-27 MED ORDER — PREDNISONE 10 MG (21) PO TBPK: ORAL_TABLET | ORAL | 0 refills | Status: DC

## 2020-05-27 MED ORDER — BENZONATATE 200 MG PO CAPS: 200.0000 mg | ORAL_CAPSULE | Freq: Three times a day (TID) | ORAL | 0 refills | Status: DC | PRN

## 2020-05-27 MED ORDER — CLOBETASOL PROP EMOLLIENT BASE 0.05 % EX CREA: 1.0000 "application " | TOPICAL_CREAM | Freq: Two times a day (BID) | CUTANEOUS | 0 refills | Status: DC

## 2020-05-27 MED ORDER — KETOROLAC TROMETHAMINE 30 MG/ML IJ SOLN
INTRAMUSCULAR | Status: AC
  Filled 2020-05-27: qty 1

## 2020-05-27 MED ORDER — LORATADINE 10 MG PO TABS: 10.0000 mg | ORAL_TABLET | Freq: Every day | ORAL | 0 refills | Status: DC

## 2020-05-27 MED ORDER — KETOROLAC TROMETHAMINE 30 MG/ML IJ SOLN
30.0000 mg | Freq: Once | INTRAMUSCULAR | Status: AC
  Administered 2020-05-27: 30 mg via INTRAMUSCULAR

## 2020-05-27 MED ORDER — IBUPROFEN 600 MG PO TABS: 600.0000 mg | ORAL_TABLET | Freq: Four times a day (QID) | ORAL | 0 refills | Status: DC | PRN

## 2020-05-27 NOTE — ED Provider Notes (Signed)
HPI  SUBJECTIVE:  Lisa Newton is a 49 y.o. female who presents with several issues.  First, she reports left earache, scalp pain/pressure starting 2 days ago after dying her hair black.  No change in her hearing, otorrhea.  She reports a a painful "knot" on the back of her neck.  Second, she reports headaches, clear rhinorrhea, postnasal drip, loss of taste and smell, decreased appetite, cough productive of clear sputum, wheezing, shortness of breath, diarrhea, fevers of 103 starting several days ago.  No body aches, nausea.  One episode of emesis.  No sinus pain or pressure, sore throat.  No known Covid exposure.  She has not yet gotten a Covid vaccine.  No antibiotics in the past month.  No antipyretic in the past 4 to 6 hours.  She reports allergy symptoms of itchy, watery eyes, sneezing.  She has tried Flonase, CBD oil, Tylenol, meloxicam.  She states the Flonase helps.  Symptoms are worse when she goes outside.  She has a past medical history of chronic pain on chronic opioids.  Has a pain contract.  History of hypertension, left ear choleastoma removal, seasonal allergies, DVT, back and neck surgery.  No history of asthma, emphysema, COPD, coronary disease, diabetes, chronic kidney disease, cancer, hypertension, immunocompromise. PMD: Doreene Eland, MD  Past Medical History:  Diagnosis Date  . Abnormal MRI, cervical spine (07/25/2018) 09/03/2018   Disc levels: C2-3: The disc appears normal. Stable mild bilateral facet hypertrophy. No spinal stenosis or nerve root encroachment. C3-4: Stable mild uncinate spurring and facet hypertrophy. No spinal stenosis or nerve root encroachment. C4-5: Stable chronic spondylosis with posterior osteophytes covering diffusely bulging disc material. The AP diameter of the canal is chronically narrowed to 8 mm  . Abnormal MRI, lumbar spine (07/25/2018) 09/03/2018   Disc levels: L4-5: The patient is status post right laminotomy for discectomy. There is some  loss of disc space height. Fat about the descending right L4 root is obliterated in the subarticular recess. This is likely due to the presence of granulation tissue. No definite mass effect on the root is seen and there is no mass effect on the thecal sac. Foramina are open. L5-S1: Minimal disc bulge and   . Acne 03/02/2018  . Anxiety   . Arthritis   . Cervical radiculitis (Left) 12/22/2016  . Chronic headaches   . DDD (degenerative disc disease), cervical 08/06/2018  . DDD (degenerative disc disease), lumbar 08/06/2018  . Depression 01/11/2012  . Disorder of skeletal system 07/18/2018  . 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 Hypertension   . Issue of medical certificate for disability examination 10/25/2013  . Left ear hearing loss   . Lumbar radiculitis (Bilateral) 08/06/2018  . Neck pain   . 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 Parotitis, acute 06/11/2018  . PONV (postoperative nausea and vomiting)   . Poor social situation 09/13/2017  . 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/2018  . 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  . 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 cholesterol granuloma     left ear  . left ear surgery     ruptured TM  . LUMBAR LAMINECTOMY/DECOMPRESSION MICRODISCECTOMY Right 12/14/2017   Procedure: MICRODISCECTOMY LUMBAR FOUR- LUMBAR FIVE - RIGHT;  Surgeon: Coletta Memos, MD;  Location: [facility];  Service: Neurosurgery;  Laterality: Right;  MICRODISCECTOMY LUMBAR 4- LUMBAR 5 - RIGHT  . POSTERIOR CERVICAL FUSION/FORAMINOTOMY N/A 03/14/2017   Procedure: LEFT C6-7  FORAMINOTOMY WITH EXCISION OF HERNIATED NUCLEUS PULPOSUS;  Surgeon: Jessy Oto, MD;  Location: [facility];  Service: Orthopedics;  Laterality: N/A;    Family History  Problem Relation Age of Onset  . Lung cancer Mother 70  . Hypertension Father   . Eczema Daughter   . Asthma Neg Hx   . Urticaria Neg Hx   . Allergic rhinitis Neg  Hx     Social History   Tobacco Use  . Smoking status: Never Smoker  . Smokeless tobacco: Never Used  Vaping Use  . Vaping Use: Never used  Substance Use Topics  . Alcohol use: Not Currently    Alcohol/week: 0.0 standard drinks  . Drug use: Not Currently    Types: Marijuana    Comment: previously tried marijuana    No current facility-administered medications for this encounter.  Current Outpatient Medications:  .  levothyroxine (SYNTHROID) 25 MCG tablet, Take 25 mcg by mouth daily before breakfast., Disp: , Rfl:  .  Metoprolol Succinate 25 MG CS24, Take by mouth., Disp: , Rfl:  .  pregabalin (LYRICA) 150 MG capsule, Take 1 capsule (150 mg total) by mouth 3 (three) times daily., Disp: 90 capsule, Rfl: 0 .  atorvastatin (LIPITOR) 10 MG tablet, Take 10 mg by mouth daily., Disp: , Rfl:  .  cholecalciferol (VITAMIN D3) 25 MCG (1000 UT) tablet, Take 1,000 Units by mouth daily., Disp: , Rfl:  .  cyclobenzaprine (FLEXERIL) 10 MG tablet, Take 1 tablet (10 mg total) by mouth 3 (three) times daily as needed for muscle spasms., Disp: 90 tablet, Rfl: 0 .  fluticasone (FLONASE) 50 MCG/ACT nasal spray, Place 2 sprays into both nostrils daily., Disp: 16 g, Rfl: 6 .  hydrochlorothiazide (HYDRODIURIL) 25 MG tablet, Take 1 tablet (25 mg total) by mouth daily., Disp: 90 tablet, Rfl: 1 .  ibuprofen (ADVIL,MOTRIN) 600 MG tablet, Take 1 tablet (600 mg total) by mouth every 8 (eight) hours as needed for moderate pain. (Patient not taking: Reported on 06/07/2019), Disp: 90 tablet, Rfl: 0 .  loratadine (CLARITIN) 10 MG tablet, Take 1 tablet (10 mg total) by mouth daily. (Patient not taking: Reported on 06/07/2019), Disp: 30 tablet, Rfl: 11 .  Olopatadine HCl (PAZEO) 0.7 % SOLN, Apply 1 drop to eye daily. (Patient not taking: Reported on 04/02/2019), Disp: 2.5 mL, Rfl: 5 .  oxyCODONE-acetaminophen (PERCOCET) 7.5-325 MG tablet, Take 1 tablet by mouth every 4 (four) hours as needed for severe pain., Disp: , Rfl:   .  prochlorperazine (COMPAZINE) 10 MG tablet, Take 1 tablet (10 mg total) by mouth every 6 (six) hours as needed for nausea or vomiting., Disp: 30 tablet, Rfl: 0 .  SUMAtriptan (IMITREX) 50 MG tablet, Take 1 tablet as needed for migraine headache. May repeat dose once after 2 hrs. Don't take more than 200 mg/day., Disp: 9 tablet, Rfl: 1 .  traMADol (ULTRAM) 50 MG tablet, Take 1 tablet (50 mg total) by mouth every 6 (six) hours as needed for severe pain., Disp: 120 tablet, Rfl: 0 .  Triamcinolone Acetonide (TRIAMCINOLONE 0.1 % CREAM : EUCERIN) CREA, Apply to affected skin BID (Patient not taking: Reported on 04/02/2019), Disp: 1 each, Rfl: 0  Allergies  Allergen Reactions  . Augmentin [Amoxicillin-Pot Clavulanate] Hives and Other (See Comments)    Took Bactrim and Augmentin together and broke out in hives.Marland KitchenMarland KitchenPt says penicillin/amoxicillin previously without any problems.the patient is unable to answer any penicillin questions as it is unknown exactly what medication broke her out in hives.  [name]  Bactrim [Sulfamethoxazole-Trimethoprim] Hives and Other (See Comments)    Took Bactrim and Augmentin together and broke out in hives.Marland KitchenMarland KitchenPt says she took bactrim previously without any problems, she did state that she had just been released after a course of vancomycin iv and didn't know if that hadn't affected the bactrim.   . Latex Hives  . Other     Gadolinium- headaches  . Pollen Extract     UNSPECIFIED REACTION   . Tape Rash     ROS  As noted in HPI.   Physical Exam  BP (!) 152/88 (BP Location: Right Arm)   Pulse 68   Temp 99.6 F (37.6 C) (Oral)   Resp 16   LMP 05/04/2020   SpO2 98%   Constitutional: Well developed, well nourished, no acute distress Eyes:  EOMI, conjunctiva normal bilaterally HENT: Normocephalic, atraumatic,mucus membranes moist.  Erythematous, swollen turbinates.  Clear nasal congestion.  No maxillary, frontal sinus tenderness.  Normal oropharynx.  Normal tonsils without  exudates.  Uvula midline.  No postnasal drip.  Right TM normal.  Left TM with postsurgical changes, intact.  No dullness, erythema, bulging.  EAC normal.  Left external ear tender, skin and irritated, pain with traction on pinna.  No pain with palpation of tragus.  No pain with palpation of mastoid.  Positive tender papules over the left scalp. Lymph: Single left-sided posterior cervical lymph node.  No anterior cervical lymphadenopathy Respiratory: Normal inspiratory effort lungs clear bilaterally, good air movement Cardiovascular: Normal rate regular rhythm no murmurs rubs or gallops GI: nondistended skin: No rash, skin intact Musculoskeletal: no deformities Neurologic: Alert & oriented x 3, no focal neuro deficits Psychiatric: Speech and behavior appropriate   ED Course   Medications  ketorolac (TORADOL) 30 MG/ML injection 30 mg (30 mg Intramuscular Given 05/27/20 1138)    Orders Placed This Encounter  Procedures  . SARS CORONAVIRUS 2 (TAT 6-24 HRS) Nasopharyngeal Nasopharyngeal Swab    Standing Status:   Standing    Number of Occurrences:   1    Order Specific Question:   Is this test for diagnosis or screening    Answer:   Diagnosis of ill patient    Order Specific Question:   Symptomatic for COVID-19 as defined by CDC    Answer:   Yes    Order Specific Question:   Date of Symptom Onset    Answer:   05/25/2020    Order Specific Question:   Hospitalized for COVID-19    Answer:   No    Order Specific Question:   Admitted to ICU for COVID-19    Answer:   No    Order Specific Question:   Previously tested for COVID-19    Answer:   No    Order Specific Question:   Resident in a congregate (group) care setting    Answer:   No    Order Specific Question:   Employed in healthcare setting    Answer:   No    Order Specific Question:   Pregnant    Answer:   No    Order Specific Question:   Has patient completed COVID vaccination(s) (2 doses of Pfizer/Moderna 1 dose of [employer])    Answer:   No  . DG Chest 2 View    Standing Status:   Standing    Number of Occurrences:   1    Order Specific Question:   Reason for Exam (SYMPTOM  OR DIAGNOSIS REQUIRED)  Answer:   fever cough r/o PNA    No results found for this or any previous visit (from the past 24 hour(s)). DG Chest 2 View  Result Date: 05/27/2020 CLINICAL DATA:  Fever, dry cough, shortness of breath and wheezing for 3 days. EXAM: CHEST - 2 VIEW COMPARISON:  PA and lateral chest 03/31/2018. FINDINGS: The lungs are clear. Heart size is normal. No pneumothorax or pleural fluid. No acute or focal bony abnormality. IMPRESSION: Negative chest. Electronically Signed   By: Drusilla Kanner M.D.   On: 05/27/2020 12:09    ED Clinical Impression  1. Viral URI with cough   2. Encounter for laboratory testing for COVID-19 virus   3. Irritant contact dermatitis due to other agents   4. Other chronic pain      ED Assessment/Plan  1.  Fever/cough.  Covid PCR sent.  Will check chest x-ray to rule out pneumonia.  This could be viral versus allergies.  There is no evidence of sinusitis.  Vitals are normal here.  No recent antipyretic.  COVID negative.  Chest x-ray independently reviewed.  Normal.  See radiology report for details.  Plan to refill Claritin, Pazeo/olopatadine 0.7% eyedrops.  Continue Flonase, start saline nasal irrigation.  Discontinue meloxicam, start ibuprofen 600 mg combined with 1000 mg of Tylenol 3-4 times a day as needed for pain.  Tessalon for cough.  2.  Ear pain: Suspect from contact dermatitis of the scalp.  No evidence of otitis media, otitis externa, mastoiditis, cellulitis. Will send home with steroid shampoo, and if this is too expensive, will send home with a 6-day prednisone taper.  3.  Chronic neck and back pain.  Patient requesting injection of Toradol.  Will give 30 mg IM x1 here.  Follow-up with PMD as needed.  4.  Covid testing.  Discussed labs, imaging, MDM, treatment  plan, and plan for follow-up with patient patient agrees with plan.   Meds ordered this encounter  Medications  . ketorolac (TORADOL) 30 MG/ML injection 30 mg    *This clinic note was created using Scientist, clinical (histocompatibility and immunogenetics). Therefore, there may be occasional mistakes despite careful proofreading.   ?    Domenick Gong, MD 05/28/20 [phone]

## 2020-05-27 NOTE — ED Triage Notes (Signed)
Pt also c/o about a knot behind her left ear that she is concerned about. She states she dyed her hair recently. She states she had a big surgery in 2016 and her whole left side hurts, ear, neck, and back.

## 2020-05-27 NOTE — ED Triage Notes (Signed)
Pt c/o back pain due to back surgery x 2 years ago. She states she was supposed to see her doctor today but they will not see her due to her cold symptoms. Pt states she has had cough, nausea and vomiting, runny nose, and watery eyes.

## 2020-05-27 NOTE — Discharge Instructions (Addendum)
Try the steroid shampoo, if it is too expensive, take the prednisone taper instead.  You do not need to do both.  Your chest x-ray was normal.  Continue Flonase, saline nasal irrigation with a Lloyd Huger med sinus rinse and distilled water as often as you want, restart the eyedrops and the loratadine.  I have refilled your loratadine and your olopatadine eyedrop.  Tessalon as needed for cough.  We will contact you if your Covid PCR comes back positive.  Discontinue meloxicam and take 600 mg of ibuprofen combined with 1000 mg of Tylenol 3-4 times a day as needed for pain.  Do not take your Percocet if you are taking Tylenol as they both have Tylenol in them.  Take 1 or the other.  Do not exceed 4 g of Tylenol from all sources in 1 day.

## 2020-05-28 LAB — SARS CORONAVIRUS 2 (TAT 6-24 HRS): SARS Coronavirus 2: NEGATIVE

## 2020-06-02 ENCOUNTER — Encounter: Payer: Self-pay | Admitting: Family Medicine

## 2020-06-02 ENCOUNTER — Ambulatory Visit (INDEPENDENT_AMBULATORY_CARE_PROVIDER_SITE_OTHER): Payer: Medicare HMO | Admitting: Family Medicine

## 2020-06-02 ENCOUNTER — Other Ambulatory Visit: Payer: Self-pay

## 2020-06-02 ENCOUNTER — Other Ambulatory Visit (HOSPITAL_COMMUNITY)
Admission: RE | Admit: 2020-06-02 | Discharge: 2020-06-02 | Disposition: A | Discharge status: Home / Self Care | Payer: Medicare HMO | Source: Ambulatory Visit | Attending: Family Medicine | Admitting: Family Medicine

## 2020-06-02 ENCOUNTER — Telehealth: Payer: Self-pay | Admitting: Psychology

## 2020-06-02 VITALS — BP 138/90 | HR 85 | Ht 66.0 in | Wt 194.0 lb

## 2020-06-02 DIAGNOSIS — M545 Low back pain: Secondary | ICD-10-CM | POA: Diagnosis not present

## 2020-06-02 DIAGNOSIS — E785 Hyperlipidemia, unspecified: Secondary | ICD-10-CM | POA: Diagnosis not present

## 2020-06-02 DIAGNOSIS — Z124 Encounter for screening for malignant neoplasm of cervix: Secondary | ICD-10-CM | POA: Insufficient documentation

## 2020-06-02 DIAGNOSIS — E039 Hypothyroidism, unspecified: Secondary | ICD-10-CM

## 2020-06-02 DIAGNOSIS — R87610 Atypical squamous cells of undetermined significance on cytologic smear of cervix (ASC-US): Secondary | ICD-10-CM | POA: Insufficient documentation

## 2020-06-02 DIAGNOSIS — E038 Other specified hypothyroidism: Secondary | ICD-10-CM

## 2020-06-02 DIAGNOSIS — I10 Essential (primary) hypertension: Secondary | ICD-10-CM

## 2020-06-02 DIAGNOSIS — G8929 Other chronic pain: Secondary | ICD-10-CM

## 2020-06-02 DIAGNOSIS — E1169 Type 2 diabetes mellitus with other specified complication: Secondary | ICD-10-CM | POA: Diagnosis not present

## 2020-06-02 DIAGNOSIS — Z0000 Encounter for general adult medical examination without abnormal findings: Secondary | ICD-10-CM

## 2020-06-02 DIAGNOSIS — Z1151 Encounter for screening for human papillomavirus (HPV): Secondary | ICD-10-CM | POA: Diagnosis not present

## 2020-06-02 LAB — POCT WET PREP (WET MOUNT)
Clue Cells Wet Prep Whiff POC: POSITIVE
Trichomonas Wet Prep HPF POC: ABSENT

## 2020-06-02 MED ORDER — KETOROLAC TROMETHAMINE 30 MG/ML IJ SOLN
30.0000 mg | Freq: Once | INTRAMUSCULAR | Status: AC
  Administered 2020-06-02: 30 mg via INTRAMUSCULAR

## 2020-06-02 MED ORDER — METRONIDAZOLE 0.75 % VA GEL: 1.0000 | Freq: Every day | VAGINAL | 0 refills | Status: AC

## 2020-06-02 MED ORDER — AZITHROMYCIN 250 MG PO TABS: ORAL_TABLET | ORAL | 0 refills | Status: DC

## 2020-06-02 NOTE — Progress Notes (Signed)
Subjective:     Lisa Newton is a 49 y.o. female and is here for a comprehensive physical exam. The patient reports problems - emotional issues and she will like to get PAP done today. Also vaginal discharge. Her son got shot in the head a few months ago. She is having issues with her daughter who is sick. She also endorses poor relationship with her daughter which she is trying to amend. Cough > 2 weeks. She was recently seen at the ED. Tessalon does not help.  Social History   Socioeconomic History  . Marital status: Single    Spouse name: Not on file  . Number of children: 3  . Years of education: some college  . Highest education level: Not on file  Occupational History  . Occupation: Unemployed  Tobacco Use  . Smoking status: Never Smoker  . Smokeless tobacco: Never Used  Vaping Use  . Vaping Use: Never used  Substance and Sexual Activity  . Alcohol use: Not Currently    Alcohol/week: 0.0 standard drinks  . Drug use: Not Currently    Types: Marijuana    Comment: previously tried marijuana  . Sexual activity: Yes    Birth control/protection: None  Other Topics Concern  . Not on file  Social History Narrative   Lives at home with her daughter.   Right-handed.   Rare caffeine use.   Social Determinants of Health   Financial Resource Strain:   . Difficulty of Paying Living Expenses:   Food Insecurity:   . Worried About Programme researcher, broadcasting/film/video in the Last Year:   . Barista in the Last Year:   Transportation Needs:   . Freight forwarder (Medical):   [name] Lack of Transportation (Non-Medical):   Physical Activity:   . Days of Exercise per Week:   . Minutes of Exercise per Session:   Stress:   . Feeling of Stress :   Social Connections:   . Frequency of Communication with Friends and Family:   . Frequency of Social Gatherings with Friends and Family:   . Attends Religious Services:   . Active Member of Clubs or Organizations:   . Attends Tax inspector Meetings:   [name] Marital Status:   Intimate Partner Violence:   . Fear of Current or Ex-Partner:   . Emotionally Abused:   [name] Physically Abused:   . Sexually Abused:    Health Maintenance  Topic Date Due  . COVID-19 Vaccine (1) Never done  . INFLUENZA VACCINE  07/12/2020  . MAMMOGRAM  07/22/2020  . PAP SMEAR-Modifier  12/23/2021  . TETANUS/TDAP  05/08/2028  . Hepatitis C Screening  Completed  . HIV Screening  Completed    The following portions of the patient's history were reviewed and updated as appropriate: allergies, current medications, past family history, past medical history, past social history, past surgical history and problem list.  Review of Systems Pertinent items noted in HPI and remainder of comprehensive ROS otherwise negative.   Objective:    BP 138/90   Pulse 85   Ht 5\' 6"  (1.676 m)   Wt 194 lb (88 kg)   LMP 05/04/2020   SpO2 99%   BMI 31.31 kg/m  General appearance: alert and cooperative Head: Normocephalic, without obvious abnormality, atraumatic Ears: normal TM's and external ear canals both ears Lungs: clear to auscultation bilaterally Heart: regular rate and rhythm, S1, S2 normal, no murmur, click, rub or gallop Abdomen: soft, non-tender; bowel sounds  normal; no masses,  no organomegaly Pelvic: cervix normal in appearance, external genitalia normal, no adnexal masses or tenderness, no cervical motion tenderness, rectovaginal septum normal and uterus normal size, shape, and consistency + creamy vaginal discharge Extremities: extremities normal, atraumatic, no cyanosis or edema Lymph nodes: Cervical, supraclavicular, and axillary nodes normal. Neurologic: Alert and oriented X 3, normal strength and tone. Normal symmetric reflexes. Normal coordination and gait      Office Visit from 06/02/2020 in [facility]  PHQ-9 Total Score 17     Body mass index is 31.31 kg/m.  Assessment:    Healthy female exam.   Plan:  I  advised her that PAP is not due till the next two year, if she gets 5 year check. However, she prefers three year check. PAP completed. Wet prep + for BV. I called and discussed the result with her. A/B escribed to her pharmacy. She prefers PV Metronidazole.  Diet and exercise counseling done for her weight. She did well losing about 57 lbs in about year. BMI down to the 30s. I will take morbid obesity off her problem list.  Discussed substance use. She denies ever using cocaine since the last she did 2 years ago. She said that was a mistake and will never make that again. She requested that this be deleted from her record.  Her PHQ score was pretty high. No SI. This is due to home stressor. She will like to connect with Dr. Hartford Poli for brief intervention. She declined pharmacologic agents for her depression. I have discussed with Dr. Hartford Poli who will reach out to her.  Bmet/FLP ordered today. However, lab was unable to get her vein. She will return for lab.  Cough: ED record reviewed. Xray negative, COVID-19 negative. Continue tessalon pearl prn.  Zpak for atypical pneumonia. Return precaution discussed.   NB: She requested Toradol shot for her chronic back pain. Shot given during this visit.   See After Visit Summary for Counseling Recommendations

## 2020-06-02 NOTE — Assessment & Plan Note (Signed)
BP ok off HCTZ. Now only on Metroprolol. I will take HCTZ off her list. Return for lab.

## 2020-06-02 NOTE — Telephone Encounter (Signed)
Scheduled BH appt with pt for 6/24 at 11am

## 2020-06-02 NOTE — Assessment & Plan Note (Signed)
Not compliant with Lipitor. Return for lab.

## 2020-06-02 NOTE — Patient Instructions (Signed)
Psychiatry Resource List (Adults and Children) Most of these providers will take Medicaid. please consult your insurance for a complete and updated list of available providers. When calling to make an appointment have your insurance information available to confirm you are covered.  [facility]:   [place]: [address]     [phone]   [place]: [address],        [phone] [place]: [address],    [phone] [place]: [address],                   [phone] Children: [facility] [address]         [phone]  [facility]  (Psychiatry & counseling ; adults & children ; will take Medicaid) [address], [place], [place]        [phone]   [facility]  (Psychiatry only; Adults /children 12 and over, will take Medicaid)  [address], [place], [place] [address]       [phone]   [facility] (Psychiatry & counseling ; adults & children ; will take Medicaid [address]  [place] [address]   Go on-line to complete referral ( [email or web address] [phone]    (Spanish therapist)  [facility]  Psychiatry & counseling; Adults and children;  Call Registration prior to scheduling an appointment [phone] [address]    [place], [place] [address]    [phone]  [facility] (Psychiatry & counseling; adults & children; Medicare no Medicaid)  [address]   [place], [place]  [address]      [phone]    [facility] (up to age 21)  Psychiatry & counseling ,will take Medicaid, must do counseling to receive psychiatry services  [address] [place] [address]        [phone]  [facility] (Psychiatry & counseling; adults & children; will take  Medicaid) Will need a referral from provider [address],  [place], [place]  [phone]  [facility]---  Walk-in Mon-Fri, 8:30-5:00 (will take Medicaid)  [address], [place], [place]  [phone]  To schedule an appointment call [phone]  [facility] --- Walk-In Mon-Friday 8am-3pm ( will take Medicaid, Psychiatry, Adults & children,  [address], [place], [place]   [phone]   [facility]--, Walk-in M-F 8am-12pm and 1pm -3pm   (Counseling, Psychiatry, will take Medicaid, adults & children)  [address], [place], [place]  [phone]    

## 2020-06-03 ENCOUNTER — Other Ambulatory Visit: Payer: Medicaid Other

## 2020-06-04 ENCOUNTER — Ambulatory Visit: Payer: Medicaid Other | Admitting: Psychology

## 2020-06-04 LAB — CYTOLOGY - PAP
Chlamydia: NEGATIVE
Comment: NEGATIVE
Comment: NEGATIVE
Comment: NORMAL
Diagnosis: UNDETERMINED — AB
High risk HPV: NEGATIVE
Neisseria Gonorrhea: NEGATIVE

## 2020-06-05 ENCOUNTER — Telehealth: Payer: Self-pay | Admitting: Family Medicine

## 2020-06-05 NOTE — Telephone Encounter (Signed)
PAP result, GC/Chlamydia discussed.   PAP: ASCUS with negative HPV. Recommendation is to repeat PAP in 3 years. I explained what this mean to her. She verbalized understanding and agreed with the plan.

## 2020-08-26 ENCOUNTER — Other Ambulatory Visit: Payer: Self-pay | Admitting: Family Medicine

## 2020-08-27 ENCOUNTER — Telehealth: Payer: Self-pay

## 2020-08-27 NOTE — Telephone Encounter (Signed)
Patient calls nurse line requesting refills on lyrica and flexeril. Advised patient that Dr. Laban Emperor had been the provider that has been previously prescribing these medications. Patient request that Dr. Lum Babe return call regarding refilling prescriptions.   Forwarding to PCP  Veronda Prude, RN

## 2020-08-27 NOTE — Telephone Encounter (Signed)
Please contact patient that I am on inpatient service and uncertain when I will be able to call her. She can reach out to her pain specialist for refills of her Lyrica and Flexeril. Offer an appointment to discuss further.

## 2020-08-28 NOTE — Telephone Encounter (Signed)
Spoke with pt informed of note below. Pt understood and stated that she will call the office if she feels the need. Aquilla Solian, CMA

## 2020-09-21 ENCOUNTER — Ambulatory Visit (HOSPITAL_COMMUNITY)
Admission: EM | Admit: 2020-09-21 | Discharge: 2020-09-21 | Disposition: A | Discharge status: Home / Self Care | Payer: Medicare HMO | Attending: Internal Medicine | Admitting: Internal Medicine

## 2020-09-21 ENCOUNTER — Other Ambulatory Visit: Payer: Self-pay

## 2020-09-21 ENCOUNTER — Encounter (HOSPITAL_COMMUNITY): Payer: Self-pay | Admitting: Emergency Medicine

## 2020-09-21 DIAGNOSIS — M545 Low back pain, unspecified: Secondary | ICD-10-CM

## 2020-09-21 DIAGNOSIS — G8929 Other chronic pain: Secondary | ICD-10-CM | POA: Diagnosis not present

## 2020-09-21 MED ORDER — TRAMADOL HCL 50 MG PO TABS: 50.0000 mg | ORAL_TABLET | Freq: Four times a day (QID) | ORAL | 0 refills | Status: DC | PRN

## 2020-09-21 MED ORDER — PREDNISONE 10 MG (21) PO TBPK: ORAL_TABLET | ORAL | 0 refills | Status: DC

## 2020-09-21 MED ORDER — KETOROLAC TROMETHAMINE 60 MG/2ML IM SOLN
INTRAMUSCULAR | Status: AC
  Filled 2020-09-21: qty 2

## 2020-09-21 MED ORDER — KETOROLAC TROMETHAMINE 60 MG/2ML IM SOLN
60.0000 mg | Freq: Once | INTRAMUSCULAR | Status: AC
  Administered 2020-09-21: 60 mg via INTRAMUSCULAR

## 2020-09-21 MED ORDER — CYCLOBENZAPRINE HCL 10 MG PO TABS: 10.0000 mg | ORAL_TABLET | Freq: Two times a day (BID) | ORAL | 0 refills | Status: DC | PRN

## 2020-09-21 NOTE — Discharge Instructions (Addendum)
Take medications as prescribed gentle range of motion exercises heating pad please call your pain medication specialist for further management.

## 2020-09-21 NOTE — ED Triage Notes (Signed)
Pt c/o chronic back pain for years. Pt states she has had two prior back surgeries. She is currently not taking any medication for pain. 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

## 2020-09-22 DIAGNOSIS — I10 Essential (primary) hypertension: Secondary | ICD-10-CM | POA: Diagnosis not present
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
## 2020-09-25 ENCOUNTER — Encounter: Payer: Self-pay | Admitting: Family Medicine

## 2020-09-25 ENCOUNTER — Other Ambulatory Visit: Payer: Self-pay

## 2020-09-25 ENCOUNTER — Ambulatory Visit (INDEPENDENT_AMBULATORY_CARE_PROVIDER_SITE_OTHER): Payer: Medicare HMO | Admitting: Family Medicine

## 2020-09-25 VITALS — BP 165/95 | HR 85 | Wt 197.0 lb

## 2020-09-25 DIAGNOSIS — M545 Low back pain, unspecified: Secondary | ICD-10-CM | POA: Diagnosis not present

## 2020-09-25 DIAGNOSIS — F331 Major depressive disorder, recurrent, moderate: Secondary | ICD-10-CM

## 2020-09-25 DIAGNOSIS — Z1231 Encounter for screening mammogram for malignant neoplasm of breast: Secondary | ICD-10-CM

## 2020-09-25 DIAGNOSIS — I10 Essential (primary) hypertension: Secondary | ICD-10-CM | POA: Diagnosis not present

## 2020-09-25 DIAGNOSIS — M961 Postlaminectomy syndrome, not elsewhere classified: Secondary | ICD-10-CM

## 2020-09-25 DIAGNOSIS — E038 Other specified hypothyroidism: Secondary | ICD-10-CM

## 2020-09-25 DIAGNOSIS — G8929 Other chronic pain: Secondary | ICD-10-CM

## 2020-09-25 MED ORDER — TRAMADOL HCL 50 MG PO TABS: 50.0000 mg | ORAL_TABLET | Freq: Two times a day (BID) | ORAL | 0 refills | Status: DC | PRN

## 2020-09-25 MED ORDER — DULOXETINE HCL 60 MG PO CPEP: ORAL_CAPSULE | ORAL | 1 refills | Status: DC

## 2020-09-25 NOTE — Patient Instructions (Signed)
It was nice seeing you. I am sorry about your back pain. Let us consider starting you on Cymbalta for depression and your back pain. I will also refer you to a pain specialist. Please see me back soon if you have additional concerns.  Your BP is elevated. Likely due to your pain. I will like to reassess you back in two weeks. If still high, we will go up on your medications.

## 2020-09-25 NOTE — Addendum Note (Signed)
Addended by: Janit Pagan T on: 09/25/2020 03:31 PM   Modules accepted: Orders

## 2020-09-25 NOTE — Assessment & Plan Note (Addendum)
Her BP was elevated today in the settings of pain. Continue current dose of Metoprolol. Instruction given regarding home BP monitoring. She will f/u with me in 2 weeks for reassessment. Will consider BP regimen adjustment at that visit. FLP and Cmet checked today.

## 2020-09-25 NOTE — Assessment & Plan Note (Signed)
Recurrent. No SI or HI. Discussed starting Cymbalta which will also improve her back pain. Discussed tapering off Lyrica while on Gabapentin. Plan: Take Lyrica 150 mg every other day for 7 days starting today. She has been taking it daily prior to now. Then take Lyrica 150 mg every two days for 7 days and then stop the medication all together.  Start Cymbalta 30 mg QD x 1 week, then start Cymbalta 60 mg QD  I contacted her after today's visit to discuss medication tapering/adjustment plan. She agreed with the plan.

## 2020-09-25 NOTE — Progress Notes (Addendum)
SUBJECTIVE:   CHIEF COMPLAINT / HPI:   Back Pain This is a chronic problem. The problem occurs constantly. The problem has been gradually worsening since onset. The pain is present in the lumbar spine (Cervical spine and lumbar. Her neck pain wraps around her chest sometimes and her lumbar pain radiates to the right leg). The pain is at a severity of 6/10. The pain is moderate. The symptoms are aggravated by bending and position (She also had a recent long hours -34 hrs drive 1 week ago which triggered her pain.). Pertinent negatives include no bladder incontinence, bowel incontinence or numbness.  She uses Lyrica daily for pain with minimal improvement, although she was supposed to take Lyrica 150 mg BID. She stated that her pain specialist will not continue to manage her medication since she is not COVID-19 vaccinated and they don't see unvaccinated patients in their clinic. She went to the Urgent care recently where she was prescribed Tramadol which helped with her symptoms.  HTN/Subclinical Hypothyroidism:Her home BP has been running high despite compliance with her Metoprolol. Last dose of her meds was this morning. No other concerns. She is off her synthroid for more than 2 months.  Depression:Not on meds. So many things going on in her life. Her daughter recently had a stroke, all of these things aggravates her anxiety and depression. She denies SI or HI.  PERTINENT  PMH / PSH: PMX reviewed.  OBJECTIVE:   Vitals:   09/25/20 0957 09/25/20 1027 09/25/20 1029  BP: (!) 152/100 (!) 180/100 (!) 165/95  Pulse: 85    SpO2: 98%    Weight: 197 lb (89.4 kg)      Physical Exam Vitals and nursing note reviewed.  Cardiovascular:     Rate and Rhythm: Normal rate and regular rhythm.     Heart sounds: Normal heart sounds. No murmur heard.   Pulmonary:     Effort: Pulmonary effort is normal. No respiratory distress.     Breath sounds: Normal breath sounds. No wheezing.  Abdominal:      General: Abdomen is flat. Bowel sounds are normal. There is no distension.     Palpations: Abdomen is soft.     Tenderness: There is no abdominal tenderness.  Musculoskeletal:     Cervical back: Tenderness present. Normal range of motion.     Thoracic back: Normal.     Lumbar back: Tenderness present. Decreased range of motion.     Right lower leg: No edema.     Left lower leg: No edema.     Comments: Linear surgical scar on centrally located on the back of her neck.      Office Visit from 09/25/2020 in [facility]  PHQ-9 Total Score 16       ASSESSMENT/PLAN:   Cervical & Lumbar postlaminectomy syndrome Pain exacerbated following prolonged driving. I reviewed her urgent care report, she was sent home with a 2 days Tramadol pain regimen. I reviewed her PMP data and there was no record of Tramadol on file. She however came in with her bottles today. I don't think this is an issue with the patient, but with the database as this had happened in the past with other patients as well. Perhaps their record is yet to be updated. I will give her a few more days of Tramadol for severe pain. She is aware that she need to reestablish pain management. May use Ibuprofen as needed. Ambulatory referral to pain clinic ordered.  F/U  as needed   Essential hypertension Her BP was elevated today in the settings of pain. Continue current dose of Metoprolol. Instruction given regarding home BP monitoring. She will f/u with me in 2 weeks for reassessment. Will consider BP regimen adjustment at that visit. FLP and Cmet checked today.  Subclinical hypothyroidism Off meds and asymptomatic. Recheck TSH today.  MDD (major depressive disorder) Recurrent. No SI or HI. Discussed starting Cymbalta which will also improve her back pain. Discussed tapering off Lyrica while on Gabapentin. Plan: Take Lyrica 150 mg every other day for 7 days starting today. She has been taking it daily  prior to now. Then take Lyrica 150 mg every two days for 7 days and then stop the medication all together.  Start Cymbalta 30 mg QD x 1 week, then start Cymbalta 60 mg QD  I contacted her after today's visit to discuss medication tapering/adjustment plan. She agreed with the plan.   NB: She was given a mammogram scheduling slip during this appointment and it was ordered as well.  Janit Pagan, MD [facility]

## 2020-09-25 NOTE — Assessment & Plan Note (Signed)
Pain exacerbated following prolonged driving. I reviewed her urgent care report, she was sent home a 2 days Tramadol pain regimen. I reviewed her PMP data and there was no record of Tramadol on file. She however came in with her bottles today. I don't think this is an issue with the patient, but with the data base as this had happened in the past. I will give her a few more days of Tramadol. She is aware that she need to reestablish pain management. Ambulatory referral to pain clinic ordered.  F/U as needed

## 2020-09-25 NOTE — Assessment & Plan Note (Signed)
Off meds and asymptomatic. Recheck TSH today.

## 2020-09-26 LAB — LIPID PANEL
Chol/HDL Ratio: 5.2 ratio — ABNORMAL HIGH (ref 0.0–4.4)
Cholesterol, Total: 202 mg/dL — ABNORMAL HIGH (ref 100–199)
HDL: 39 mg/dL — ABNORMAL LOW (ref >39)
LDL Chol Calc (NIH): 148 mg/dL — ABNORMAL HIGH (ref 0–99)
Triglycerides: 85 mg/dL (ref 0–149)
VLDL Cholesterol Cal: 15 mg/dL (ref 5–40)

## 2020-09-26 LAB — CMP14+EGFR
ALT: 10 IU/L (ref 0–32)
AST: 14 IU/L (ref 0–40)
Albumin/Globulin Ratio: 1.8 (ref 1.2–2.2)
Albumin: 4.4 g/dL (ref 3.8–4.8)
Alkaline Phosphatase: 100 IU/L (ref 44–121)
BUN/Creatinine Ratio: 8 — ABNORMAL LOW (ref 9–23)
BUN: 6 mg/dL (ref 6–24)
Bilirubin Total: 0.3 mg/dL (ref 0.0–1.2)
CO2: 26 mmol/L (ref 20–29)
Calcium: 9.6 mg/dL (ref 8.7–10.2)
Chloride: 100 mmol/L (ref 96–106)
Creatinine, Ser: 0.71 mg/dL (ref 0.57–1.00)
GFR calc Af Amer: 116 mL/min/{1.73_m2} (ref >59)
GFR calc non Af Amer: 101 mL/min/{1.73_m2} (ref >59)
Globulin, Total: 2.5 g/dL (ref 1.5–4.5)
Glucose: 82 mg/dL (ref 65–99)
Potassium: 3.5 mmol/L (ref 3.5–5.2)
Sodium: 138 mmol/L (ref 134–144)
Total Protein: 6.9 g/dL (ref 6.0–8.5)

## 2020-09-26 LAB — TSH: TSH: 9.95 u[IU]/mL — ABNORMAL HIGH (ref 0.450–4.500)

## 2020-09-28 ENCOUNTER — Telehealth: Payer: Self-pay | Admitting: Family Medicine

## 2020-09-28 MED ORDER — TRAMADOL HCL 50 MG PO TABS
50.0000 mg | ORAL_TABLET | Freq: Two times a day (BID) | ORAL | 0 refills | Status: AC | PRN
Start: 2020-09-28 — End: 2020-10-08

## 2020-09-28 MED ORDER — ATORVASTATIN CALCIUM 20 MG PO TABS: 20.0000 mg | ORAL_TABLET | Freq: Every day | ORAL | 3 refills | Status: DC

## 2020-09-28 NOTE — Telephone Encounter (Signed)
Test results discussed.  TSH elevated, worsened from prior test. She has been off synthroid for a while. I recommended TFT in 2 weeks. F/U appointment made with me.  FLP abnormal. Off Lipitor for months. I will restart moderate intensity dose. She agreed with the plan.  She mentioned that the pharmacy did not get her Tramadol refill. I reviewed record and it seems the transmission failed.  I resent her Tramadol. She is aware that this will be a one time refill from our practice. Referral sent to pain specialist.

## 2020-10-06 DIAGNOSIS — M546 Pain in thoracic spine: Secondary | ICD-10-CM | POA: Diagnosis not present

## 2020-10-06 DIAGNOSIS — M129 Arthropathy, unspecified: Secondary | ICD-10-CM | POA: Diagnosis not present

## 2020-10-06 DIAGNOSIS — Z1159 Encounter for screening for other viral diseases: Secondary | ICD-10-CM | POA: Diagnosis not present

## 2020-10-06 DIAGNOSIS — M791 Myalgia, unspecified site: Secondary | ICD-10-CM | POA: Diagnosis not present

## 2020-10-06 DIAGNOSIS — E559 Vitamin D deficiency, unspecified: Secondary | ICD-10-CM | POA: Diagnosis not present

## 2020-10-06 DIAGNOSIS — M545 Low back pain, unspecified: Secondary | ICD-10-CM | POA: Diagnosis not present

## 2020-10-06 DIAGNOSIS — M542 Cervicalgia: Secondary | ICD-10-CM | POA: Diagnosis not present

## 2020-10-06 DIAGNOSIS — Z79899 Other long term (current) drug therapy: Secondary | ICD-10-CM | POA: Diagnosis not present

## 2020-10-12 ENCOUNTER — Ambulatory Visit: Payer: Medicaid Other

## 2020-10-16 ENCOUNTER — Encounter: Payer: Self-pay | Admitting: Family Medicine

## 2020-10-16 ENCOUNTER — Ambulatory Visit: Payer: Medicaid Other | Admitting: Family Medicine

## 2020-10-16 ENCOUNTER — Other Ambulatory Visit: Payer: Self-pay

## 2020-10-16 VITALS — BP 159/100 | HR 86 | Ht 66.0 in | Wt 196.0 lb

## 2020-10-16 DIAGNOSIS — I10 Essential (primary) hypertension: Secondary | ICD-10-CM

## 2020-10-16 DIAGNOSIS — M961 Postlaminectomy syndrome, not elsewhere classified: Secondary | ICD-10-CM

## 2020-10-16 DIAGNOSIS — E038 Other specified hypothyroidism: Secondary | ICD-10-CM | POA: Diagnosis not present

## 2020-10-16 DIAGNOSIS — G8929 Other chronic pain: Secondary | ICD-10-CM

## 2020-10-16 DIAGNOSIS — M549 Dorsalgia, unspecified: Secondary | ICD-10-CM | POA: Diagnosis not present

## 2020-10-16 MED ORDER — HYDROCHLOROTHIAZIDE 12.5 MG PO TABS: 12.5000 mg | ORAL_TABLET | Freq: Every day | ORAL | 1 refills | Status: DC

## 2020-10-16 NOTE — Patient Instructions (Signed)
Blood Pressure Record Sheet To take your blood pressure, you will need a blood pressure machine. You can buy a blood pressure machine (blood pressure monitor) at your clinic, drug store, or online. When choosing one, consider:  An automatic monitor that has an arm cuff.  A cuff that wraps snugly around your upper arm. You should be able to fit only one finger between your arm and the cuff.  A device that stores blood pressure reading results.  Do not choose a monitor that measures your blood pressure from your wrist or finger. Follow your health care provider's instructions for how to take your blood pressure. To use this form:  Get one reading in the morning (a.m.) before you take any medicines.  Get one reading in the evening (p.m.) before supper.  Take at least 2 readings with each blood pressure check. This makes sure the results are correct. Wait 1-2 minutes between measurements.  Write down the results in the spaces on this form.  Repeat this once a week, or as told by your health care provider.  Make a follow-up appointment with your health care provider to discuss the results. Blood pressure log Date: _______________________  a.m. _____________________(1st reading) _____________________(2nd reading)  p.m. _____________________(1st reading) _____________________(2nd reading) Date: _______________________  a.m. _____________________(1st reading) _____________________(2nd reading)  p.m. _____________________(1st reading) _____________________(2nd reading) Date: _______________________  a.m. _____________________(1st reading) _____________________(2nd reading)  p.m. _____________________(1st reading) _____________________(2nd reading) Date: _______________________  a.m. _____________________(1st reading) _____________________(2nd reading)  p.m. _____________________(1st reading) _____________________(2nd reading) Date: _______________________  a.m.  _____________________(1st reading) _____________________(2nd reading)  p.m. _____________________(1st reading) _____________________(2nd reading) This information is not intended to replace advice given to you by your health care provider. Make sure you discuss any questions you have with your health care provider. Document Revised: 01/26/2018 Document Reviewed: 11/28/2017 Elsevier Patient Education  2020 Elsevier Inc.  

## 2020-10-16 NOTE — Assessment & Plan Note (Signed)
Stable on current pain regimen. Continue management with [facility].

## 2020-10-16 NOTE — Assessment & Plan Note (Signed)
TFT rechecked today. Continue current dose of Synthroid. I will contact her soon with her result.

## 2020-10-16 NOTE — Progress Notes (Signed)
° ° °  SUBJECTIVE:   CHIEF COMPLAINT / HPI:   HTN: She is compliant with metoprolol ER 50 mg qd. Record indicates she was taking 25 mg instead and this was updated. Her home BP measurement has been in the 160s systolic and 90s diastolic. No cardiopulmonary or neurologic symptoms.  Hypothyroidism: She restarted her Synthroid 50 mcg qd x 2 weeks. No new concerns.  Back pain: Improved. She has established pain management care with [facility] and was started on Oxycodone. She requested DMV handicap card during last discussion with her.  PERTINENT  PMH / PSH: PMX reviewed   OBJECTIVE:   Vitals:   10/16/20 0937 10/16/20 0954 10/16/20 0956  BP: (!) 142/92 (!) 170/110 (!) 159/100  Pulse: 86    SpO2: 98%    Weight: 196 lb (88.9 kg)    Height: 5\' 6"  (1.676 m)      Physical Exam Vitals and nursing note reviewed.  Cardiovascular:     Rate and Rhythm: Normal rate and regular rhythm.     Heart sounds: Normal heart sounds. No murmur heard.   Pulmonary:     Effort: Pulmonary effort is normal. No respiratory distress.     Breath sounds: Normal breath sounds. No wheezing.  Abdominal:     General: Bowel sounds are normal.     Tenderness: There is no abdominal tenderness.  Musculoskeletal:     Right lower leg: No edema.     Left lower leg: No edema.      ASSESSMENT/PLAN:   Essential hypertension Here BP is still elevated despite med compliance. I recommended addition of an addition BP agent. She was previously on HCTZ 25 mg qd and will be willing to get back on it. I will start her on HCTZ 12.5 mg qd. Continue home BP monitoring. F/U in 4 weeks for reassessment.  Subclinical hypothyroidism TFT rechecked today. Continue current dose of Synthroid. I will contact her soon with her result.  Lumbar postlaminectomy syndrome Stable on current pain regimen. Continue management with [facility].   Declined COVID-19 shot today. She will alert [facility] when she  decides on getting it. She will schedule her mammogram.  [place], MD [facility]

## 2020-10-16 NOTE — Assessment & Plan Note (Signed)
Here BP is still elevated despite med compliance. I recommended addition of an addition BP agent. She was previously on HCTZ 25 mg qd and will be willing to get back on it. I will start her on HCTZ 12.5 mg qd. Continue home BP monitoring. F/U in 4 weeks for reassessment.

## 2020-10-17 LAB — T4, FREE: Free T4: 1.15 ng/dL (ref 0.82–1.77)

## 2020-10-17 LAB — T3, FREE: T3, Free: 3.2 pg/mL (ref 2.0–4.4)

## 2020-10-17 LAB — TSH: TSH: 1.28 u[IU]/mL (ref 0.450–4.500)

## 2020-10-19 ENCOUNTER — Telehealth: Payer: Self-pay | Admitting: Family Medicine

## 2020-10-19 MED ORDER — DULOXETINE HCL 60 MG PO CPEP: ORAL_CAPSULE | ORAL | 1 refills | Status: DC

## 2020-10-19 MED ORDER — LEVOTHYROXINE SODIUM 50 MCG PO TABS
50.0000 ug | ORAL_TABLET | Freq: Every day | ORAL | 0 refills | Status: DC
Start: 2020-10-19 — End: 2021-04-07

## 2020-10-19 NOTE — Telephone Encounter (Signed)
TFT result discussed. Unclear if she is back to normal because she started Synthroid 2 weeks prior to the test vs she actually does not need to be on Synthroid.  Since I am unable to tell which is the situation her, I recommended continuation of her Synthroid and repeat lab in about 6 months. She agreed with the plan.

## 2020-10-21 DIAGNOSIS — M545 Low back pain, unspecified: Secondary | ICD-10-CM | POA: Diagnosis not present

## 2020-10-21 DIAGNOSIS — M539 Dorsopathy, unspecified: Secondary | ICD-10-CM | POA: Diagnosis not present

## 2020-10-21 DIAGNOSIS — Z79899 Other long term (current) drug therapy: Secondary | ICD-10-CM | POA: Diagnosis not present

## 2020-10-21 DIAGNOSIS — M791 Myalgia, unspecified site: Secondary | ICD-10-CM | POA: Diagnosis not present

## 2020-10-23 ENCOUNTER — Ambulatory Visit
Admission: RE | Admit: 2020-10-23 | Discharge: 2020-10-23 | Disposition: A | Discharge status: Home / Self Care | Payer: Medicare HMO | Source: Ambulatory Visit | Attending: Family Medicine | Admitting: Family Medicine

## 2020-10-23 ENCOUNTER — Other Ambulatory Visit: Payer: Self-pay

## 2020-10-23 DIAGNOSIS — Z1231 Encounter for screening mammogram for malignant neoplasm of breast: Secondary | ICD-10-CM

## 2020-11-24 DIAGNOSIS — M791 Myalgia, unspecified site: Secondary | ICD-10-CM | POA: Diagnosis not present

## 2020-11-24 DIAGNOSIS — M545 Low back pain, unspecified: Secondary | ICD-10-CM | POA: Diagnosis not present

## 2020-11-24 DIAGNOSIS — H524 Presbyopia: Secondary | ICD-10-CM | POA: Diagnosis not present

## 2020-11-24 DIAGNOSIS — H52209 Unspecified astigmatism, unspecified eye: Secondary | ICD-10-CM | POA: Diagnosis not present

## 2020-11-24 DIAGNOSIS — H5203 Hypermetropia, bilateral: Secondary | ICD-10-CM | POA: Diagnosis not present

## 2020-11-24 DIAGNOSIS — Z79899 Other long term (current) drug therapy: Secondary | ICD-10-CM | POA: Diagnosis not present

## 2020-11-24 DIAGNOSIS — M539 Dorsopathy, unspecified: Secondary | ICD-10-CM | POA: Diagnosis not present

## 2020-12-31 ENCOUNTER — Encounter (HOSPITAL_COMMUNITY): Payer: Self-pay

## 2020-12-31 ENCOUNTER — Ambulatory Visit (HOSPITAL_COMMUNITY)
Admission: EM | Admit: 2020-12-31 | Discharge: 2020-12-31 | Disposition: A | Discharge status: Home / Self Care | Payer: Medicare Other | Attending: Family Medicine | Admitting: Family Medicine

## 2020-12-31 ENCOUNTER — Other Ambulatory Visit: Payer: Self-pay

## 2020-12-31 DIAGNOSIS — J069 Acute upper respiratory infection, unspecified: Secondary | ICD-10-CM | POA: Diagnosis not present

## 2020-12-31 DIAGNOSIS — Z79899 Other long term (current) drug therapy: Secondary | ICD-10-CM | POA: Diagnosis not present

## 2020-12-31 DIAGNOSIS — U071 COVID-19: Secondary | ICD-10-CM | POA: Insufficient documentation

## 2020-12-31 DIAGNOSIS — Z888 Allergy status to other drugs, medicaments and biological substances status: Secondary | ICD-10-CM | POA: Diagnosis not present

## 2020-12-31 DIAGNOSIS — Z880 Allergy status to penicillin: Secondary | ICD-10-CM | POA: Insufficient documentation

## 2020-12-31 DIAGNOSIS — M545 Low back pain, unspecified: Secondary | ICD-10-CM | POA: Diagnosis not present

## 2020-12-31 DIAGNOSIS — Z881 Allergy status to other antibiotic agents status: Secondary | ICD-10-CM | POA: Insufficient documentation

## 2020-12-31 DIAGNOSIS — R519 Headache, unspecified: Secondary | ICD-10-CM | POA: Diagnosis present

## 2020-12-31 DIAGNOSIS — G8929 Other chronic pain: Secondary | ICD-10-CM | POA: Diagnosis not present

## 2020-12-31 DIAGNOSIS — R350 Frequency of micturition: Secondary | ICD-10-CM | POA: Insufficient documentation

## 2020-12-31 LAB — POCT URINALYSIS DIPSTICK, ED / UC
Bilirubin Urine: NEGATIVE
Glucose, UA: NEGATIVE mg/dL
Hgb urine dipstick: NEGATIVE
Ketones, ur: NEGATIVE mg/dL
Leukocytes,Ua: NEGATIVE
Nitrite: NEGATIVE
Protein, ur: NEGATIVE mg/dL
Specific Gravity, Urine: 1.01 (ref 1.005–1.030)
Urobilinogen, UA: 0.2 mg/dL (ref 0.0–1.0)
pH: 5.5 (ref 5.0–8.0)

## 2020-12-31 LAB — POC URINE PREG, ED: Preg Test, Ur: NEGATIVE

## 2020-12-31 MED ORDER — ONDANSETRON HCL 8 MG PO TABS: 8.0000 mg | ORAL_TABLET | Freq: Three times a day (TID) | ORAL | 0 refills | Status: DC | PRN

## 2020-12-31 MED ORDER — PROMETHAZINE-DM 6.25-15 MG/5ML PO SYRP: 5.0000 mL | ORAL_SOLUTION | Freq: Four times a day (QID) | ORAL | 0 refills | Status: DC | PRN

## 2020-12-31 MED ORDER — BENZONATATE 100 MG PO CAPS: 100.0000 mg | ORAL_CAPSULE | Freq: Three times a day (TID) | ORAL | 0 refills | Status: DC

## 2020-12-31 NOTE — ED Triage Notes (Signed)
Pt presents with x chills, headache, earache, sore throat x 4 days. Pt states she has had chronic back pain and states the pain has worsened these past days. Pt states she would like to be tested for a UTI.

## 2020-12-31 NOTE — Discharge Instructions (Signed)
-  Tessalon as needed for cough. Take one pill up to 3x daily (every 8 hours) -Promethazine DM cough syrup for congestion/cough. This could make you drowsy, so take at night before bed. -Zofran as needed for nausea/vomiting, up to 3x daily  -For fevers/chills, body aches, headaches- use Tylenol and Ibuprofen. You can alternate these for maximum effect. Use up to 3000mg  Tylenol daily and 3200mg  Ibuprofen daily. Make sure to take ibuprofen with food. Check the bottle of ibuprofen/tylenol for specific dosage instructions. -We'll call you if the result of your urine culture is positive for bacteria. We could send in an antibiotic at that time. -Continue the oxycodone for your back pain  We are currently awaiting result of your PCR covid-19 test. This typically comes back in 1-2 days. We'll call you if the result is positive. Otherwise, the result will be sent electronically to your MyChart. You can also call this clinic and ask for your result via telephone.   Please isolate at home while awaiting these results. If your test is positive for Covid-19, continue to isolate at home for 5 days if you have mild symptoms, or a total of 10 days from symptom onset if you have more severe symptoms. If you quarantine for a shorter period of time (i.e. 5 days), make sure to wear a mask until day 10 of symptoms. Treat your symptoms at home with OTC remedies like tylenol/ibuprofen, mucinex, nyquil, etc. Seek medical attention if you develop high fevers, chest pain, shortness of breath, ear pain, facial pain, etc. Make sure to get up and move around every 2-3 hours while convalescing to help prevent blood clots. Drink plenty of fluids, and rest as much as possible.
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
## 2020-12-31 NOTE — ED Notes (Signed)
Pt called no answered.

## 2021-01-01 LAB — SARS CORONAVIRUS 2 (TAT 6-24 HRS): SARS Coronavirus 2: POSITIVE — AB

## 2021-01-02 LAB — URINE CULTURE: Culture: <10000 — AB

## 2021-02-17 ENCOUNTER — Ambulatory Visit: Payer: Self-pay

## 2021-02-17 ENCOUNTER — Encounter: Payer: Self-pay | Admitting: Specialist

## 2021-02-17 ENCOUNTER — Other Ambulatory Visit: Payer: Self-pay

## 2021-02-17 ENCOUNTER — Ambulatory Visit (INDEPENDENT_AMBULATORY_CARE_PROVIDER_SITE_OTHER): Payer: Medicare Other | Admitting: Specialist

## 2021-02-17 VITALS — BP 157/92 | HR 76 | Ht 66.0 in | Wt 208.0 lb

## 2021-02-17 DIAGNOSIS — M542 Cervicalgia: Secondary | ICD-10-CM | POA: Diagnosis not present

## 2021-02-17 DIAGNOSIS — M4722 Other spondylosis with radiculopathy, cervical region: Secondary | ICD-10-CM

## 2021-02-17 DIAGNOSIS — M4802 Spinal stenosis, cervical region: Secondary | ICD-10-CM | POA: Diagnosis not present

## 2021-02-17 NOTE — Progress Notes (Signed)
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 Franky Macho right L5-S1 laminectomy. Neck pain is right neck pain for last several months before Christmas. Pain is constant into the neck and she relates that she has had to take pain meds to relieve the pain. There is pain with moving the neck. Previous surgery brain at [facility].
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
## 2021-02-24 ENCOUNTER — Other Ambulatory Visit: Payer: Self-pay

## 2021-02-24 ENCOUNTER — Ambulatory Visit
Admission: RE | Admit: 2021-02-24 | Discharge: 2021-02-24 | Disposition: A | Discharge status: Home / Self Care | Payer: Medicare Other | Source: Ambulatory Visit | Attending: Specialist | Admitting: Specialist

## 2021-02-24 DIAGNOSIS — M4722 Other spondylosis with radiculopathy, cervical region: Secondary | ICD-10-CM

## 2021-02-24 DIAGNOSIS — M4802 Spinal stenosis, cervical region: Secondary | ICD-10-CM

## 2021-02-24 DIAGNOSIS — M542 Cervicalgia: Secondary | ICD-10-CM

## 2021-03-15 ENCOUNTER — Ambulatory Visit: Payer: Medicare Other | Admitting: Specialist

## 2021-03-17 ENCOUNTER — Ambulatory Visit (INDEPENDENT_AMBULATORY_CARE_PROVIDER_SITE_OTHER): Payer: Medicare Other | Admitting: Specialist

## 2021-03-17 ENCOUNTER — Other Ambulatory Visit: Payer: Self-pay

## 2021-03-17 ENCOUNTER — Encounter: Payer: Self-pay | Admitting: Specialist

## 2021-03-17 VITALS — BP 162/118 | HR 71 | Ht 66.0 in | Wt 208.0 lb

## 2021-03-17 DIAGNOSIS — M5412 Radiculopathy, cervical region: Secondary | ICD-10-CM

## 2021-03-17 DIAGNOSIS — M4802 Spinal stenosis, cervical region: Secondary | ICD-10-CM

## 2021-03-17 DIAGNOSIS — M5010 Cervical disc disorder with radiculopathy, unspecified cervical region: Secondary | ICD-10-CM

## 2021-03-17 MED ORDER — GABAPENTIN 300 MG PO CAPS: ORAL_CAPSULE | ORAL | 0 refills | Status: DC
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
## 2021-03-17 NOTE — Patient Instructions (Signed)
Avoid overhead lifting and overhead use of the arms. Do not lift greater than 5-10 lbs. Adjust head rest in vehicle to prevent hyperextension if rear ended. Take extra precautions to avoid falling. I recommend you consider having a anterior cervical discectomy and fusion at C6-7 for the disc herniation and radiculopathy in the left arm.

## 2021-03-18 ENCOUNTER — Encounter: Payer: Self-pay | Admitting: Family Medicine

## 2021-03-18 ENCOUNTER — Ambulatory Visit (INDEPENDENT_AMBULATORY_CARE_PROVIDER_SITE_OTHER): Payer: Medicare Other | Admitting: Family Medicine

## 2021-03-18 VITALS — BP 172/91 | HR 86 | Ht 66.0 in | Wt 226.4 lb

## 2021-03-18 DIAGNOSIS — I10 Essential (primary) hypertension: Secondary | ICD-10-CM | POA: Diagnosis not present

## 2021-03-18 MED ORDER — LOSARTAN POTASSIUM-HCTZ 50-12.5 MG PO TABS: 1.0000 | ORAL_TABLET | Freq: Every day | ORAL | 1 refills | Status: DC

## 2021-03-18 NOTE — Progress Notes (Addendum)
    SUBJECTIVE:   CHIEF COMPLAINT / HPI:   Elevated blood pressure Ms. cousin reports that she presents to clinic today due to concern for elevated blood pressures.  She has been seeing her back surgeon multiple times recently for a work-up and possible intervention for her back pain.  She has been told on multiple occasions that her blood pressure is elevated when she needs to follow-up with her primary care provider.  She ultimately decided to schedule an appointment this morning for her elevated blood pressure.  She currently denies new headaches, new vision changes, new nausea or vomiting.  She reports that she has been prescribed metoprolol and hydrochlorothiazide.  She has not taken any hydrochlorothiazide in the past 4 days because she ran out and did not receive a refill because she was told there is a recall on hydrochlorothiazide.  PERTINENT  PMH / PSH: Chronic back pain, MDD, HTN  OBJECTIVE:   BP (!) 172/91   Pulse 86   Ht 5\' 6"  (1.676 m)   Wt 226 lb 6.4 oz (102.7 kg)   LMP 02/04/2021   SpO2 98%   BMI 36.54 kg/m    General: Alert and cooperative and appears to be in no acute distress Cardio: Normal S1 and S2, no S3 or S4. Rhythm is regular. No murmurs or rubs.   Pulm: Clear to auscultation bilaterally, no crackles, wheezing, or diminished breath sounds. Normal respiratory effort Abdomen: Bowel sounds normal. Abdomen soft and non-tender.  Extremities: No peripheral edema. Warm/ well perfused.  Strong radial pulses. Neuro: Cranial nerves grossly intact  ASSESSMENT/PLAN:   Essential hypertension Her blood pressure is significantly elevated today although she is not having any signs have hypertensive emergency.  Based on the elevation of her blood pressure, I do not think it would be sufficient to start one additional medication, I think she would benefit from smaller doses of multiple medications.  I would like her to stay on HCTZ because she seems to be tolerating it well.   I will start a combination HCTZ, losartan.  I have only provided 1 month at a time in case she does need further titration. -Start HCTZ/losartan today -Follow-up blood work in 1 week -Follow-up with Dr. 02/06/2021 (PCP) at the end of this month.     Lum Babe, MD [facility]

## 2021-03-18 NOTE — Patient Instructions (Signed)
It was great to see you today.  Here is a quick review of the things we talked about:  Hypertension: I agree, your blood pressure is not well controlled today.  I think it would be beneficial to start a combination pill today.  This pill will have both hydrochlorothiazide (the medication you previously had been taking) in addition to losartan.  Please come back to clinic in 1 week for blood work, you do not have to see a doctor at that time.  And keep your next appointment with Dr. Lum Babe where she can make further changes as needed.   If all of your labs are normal, I will send you a message over my chart or send you a letter.  If there is anything to discuss, I will give you a phone call.

## 2021-03-18 NOTE — Assessment & Plan Note (Signed)
Her blood pressure is significantly elevated today although she is not having any signs have hypertensive emergency.  Based on the elevation of her blood pressure, I do not think it would be sufficient to start one additional medication, I think she would benefit from smaller doses of multiple medications.  I would like her to stay on HCTZ because she seems to be tolerating it well.  I will start a combination HCTZ, losartan.  I have only provided 1 month at a time in case she does need further titration. -Start HCTZ/losartan today -Follow-up blood work in 1 week -Follow-up with Dr. Lum Babe (PCP) at the end of this month.

## 2021-03-30 ENCOUNTER — Other Ambulatory Visit: Payer: Self-pay | Admitting: Neurosurgery

## 2021-04-06 ENCOUNTER — Ambulatory Visit (INDEPENDENT_AMBULATORY_CARE_PROVIDER_SITE_OTHER): Payer: Medicare Other | Admitting: Family Medicine

## 2021-04-06 ENCOUNTER — Other Ambulatory Visit: Payer: Self-pay

## 2021-04-06 ENCOUNTER — Encounter: Payer: Self-pay | Admitting: Family Medicine

## 2021-04-06 VITALS — BP 137/96 | HR 78 | Ht 66.0 in | Wt 227.0 lb

## 2021-04-06 DIAGNOSIS — M47812 Spondylosis without myelopathy or radiculopathy, cervical region: Secondary | ICD-10-CM

## 2021-04-06 DIAGNOSIS — J302 Other seasonal allergic rhinitis: Secondary | ICD-10-CM

## 2021-04-06 DIAGNOSIS — F331 Major depressive disorder, recurrent, moderate: Secondary | ICD-10-CM

## 2021-04-06 DIAGNOSIS — I10 Essential (primary) hypertension: Secondary | ICD-10-CM

## 2021-04-06 DIAGNOSIS — E039 Hypothyroidism, unspecified: Secondary | ICD-10-CM | POA: Diagnosis not present

## 2021-04-06 DIAGNOSIS — E038 Other specified hypothyroidism: Secondary | ICD-10-CM | POA: Diagnosis not present

## 2021-04-06 DIAGNOSIS — E785 Hyperlipidemia, unspecified: Secondary | ICD-10-CM

## 2021-04-06 MED ORDER — LEVOCETIRIZINE DIHYDROCHLORIDE 5 MG PO TABS: 5.0000 mg | ORAL_TABLET | Freq: Every evening | ORAL | 1 refills | Status: DC

## 2021-04-06 NOTE — Assessment & Plan Note (Signed)
Chart reviewed. She is scheduled for Anterior Cervcal Discectomy and  Fusion surgery in May. F/U with spine specialist as planned.

## 2021-04-06 NOTE — Assessment & Plan Note (Signed)
Xyzal discussed. She is interested in trying it.

## 2021-04-06 NOTE — Assessment & Plan Note (Signed)
Restart Cymbalta. List of psychiatrists in the area to schedule counseling appointment. F/U as needed.

## 2021-04-06 NOTE — Patient Instructions (Signed)
Psychiatry Resource List (Adults and Children) Most of these providers will take Medicaid. please consult your insurance for a complete and updated list of available providers. When calling to make an appointment have your insurance information available to confirm you are covered.   [facility]iatry and Counseling [address] [place], [place] [address] [phone]  [facility]  [address] [place], [place] [phone] Crisis [phone]   [facility]:   [place]: [address]     [phone]   [place]: [address],        [phone] : [address],    [phone] : [address],                   [phone] Children: [facility] [address]         [phone]  [facility] (virtual only) [phone]    [facility]  (Psychiatry only; Adults /children 12 and over, will take Medicaid)  [address], [place], [place] [address]       [phone]   [facility] (Psychiatry & counseling ; adults & children ; will take Medicaid [address]  [place] [address]  Go on-line to complete referral ( [email or web address]://[email or web address].[email or web address]/en/[email or web address]-referral [phone]    (Spanish speaking therapists)  [facility]  Psychiatry & counseling; Adults and children;  Call Registration prior to scheduling an appointment [phone] [address]    [place], [place] [address]    [phone]  [facility] (Psychiatry & counseling; adults & children; Medicare no Medicaid)  [address]   [place], [place]  [address]      [phone]    [facility] (up to age 21)  Psychiatry & counseling ,will take Medicaid, must do counseling to receive psychiatry services  [address] [place]  [place] [address]        [phone]  [facility] (Psychiatry & counseling; adults & children; will take Medicaid) Will need a referral from provider [address],  , [place]  [phone]   [facility] --- Walk-In Mon-Friday 8am-3pm ( will take Medicaid, Psychiatry, Adults & children,  [address], [place], [place]   [phone]   [facility]--, Walk-in M-F 8am-12pm and 1pm -3pm   (Counseling, Psychiatry, will take Medicaid, adults & children)  [address], , [place]  [phone]   

## 2021-04-06 NOTE — Progress Notes (Signed)
    SUBJECTIVE:   CHIEF COMPLAINT / HPI:   Back surgery: Patient mentioned that she is scheduled for back surgery in May due to worsening of her neck and lower back pain. She f/u with pain clinic regularly and she is on Percocet for pain. No new concerns.  Thyroid: Compliant with her Synthroid. She is here for f/u.  HTN/HLD:Recently started Hyzaar 50/12.5 mg QD. Also on Metoprolol 50 mg qd. She endorses compliance with both meds. Her home BP has been in the 130/80-90 range. No other concerns.  Depression:  She endorses worsening of her depression. Her son was arrested and locked up by the federal government for gun possession about 3-4 weeks ago and is yet to be released. She has not been on her Cymbalta for months and will like to get back on it. She now works twice a week as a guard. This encourages her to get out of the house and take the focus off her worries and depression.  Allergies: Zyrtec and Loratadine not working.  PERTINENT  PMH / PSH: PMX reviewed.  OBJECTIVE:   Vitals:   04/06/21 1012 04/06/21 1033 04/06/21 1035  BP: (!) 156/96 (!) 157/103 (!) 137/96  Pulse: 78    SpO2: 97%    Weight: 227 lb (103 kg)    Height: 5\' 6"  (1.676 m)       Physical Exam Vitals and nursing note reviewed.  Cardiovascular:     Rate and Rhythm: Normal rate and regular rhythm.     Heart sounds: Normal heart sounds. No murmur heard.   Pulmonary:     Effort: Pulmonary effort is normal.     Breath sounds: Normal breath sounds. No wheezing.  Abdominal:     General: Abdomen is flat. Bowel sounds are normal.     Palpations: Abdomen is soft. There is no mass.     Tenderness: There is no abdominal tenderness.  Musculoskeletal:     Right lower leg: No edema.     Left lower leg: No edema.     Comments: Surgical scar on her upper back. Adequate cervical spine ROM. No lumbar tenderness.      ASSESSMENT/PLAN:   Spondylosis without myelopathy or radiculopathy, cervical region Chart  reviewed. She is scheduled for Anterior Cervcal Discectomy and  Fusion surgery in May. F/U with spine specialist as planned.  Subclinical hypothyroidism Checked TSH today.  Essential hypertension BP slightly elevated - improved after multiple checks. Continue current regimen. F/U in 1-2 weeks for reassessment. Consider med adjustment if no improvement. She agreed with the plan. F/U appointment scheduled. Bmet checked today.  Hyperlipidemia FLP checked today.  MDD (major depressive disorder) Restart Cymbalta. List of psychiatrists in the area to schedule counseling appointment. F/U as needed.  Seasonal allergies Xyzal discussed. She is interested in trying it.    June, MD [facility]

## 2021-04-06 NOTE — Assessment & Plan Note (Signed)
FLP checked today. 

## 2021-04-06 NOTE — Assessment & Plan Note (Signed)
Checked TSH today.

## 2021-04-06 NOTE — Assessment & Plan Note (Signed)
BP slightly elevated - improved after multiple checks. Continue current regimen. F/U in 1-2 weeks for reassessment. Consider med adjustment if no improvement. She agreed with the plan. F/U appointment scheduled. Bmet checked today.

## 2021-04-07 ENCOUNTER — Ambulatory Visit (HOSPITAL_COMMUNITY): Payer: Medicare Other | Admitting: Licensed Clinical Social Worker

## 2021-04-07 LAB — BASIC METABOLIC PANEL
BUN/Creatinine Ratio: 8 — ABNORMAL LOW (ref 9–23)
BUN: 7 mg/dL (ref 6–24)
CO2: 26 mmol/L (ref 20–29)
Calcium: 10.3 mg/dL — ABNORMAL HIGH (ref 8.7–10.2)
Chloride: 97 mmol/L (ref 96–106)
Creatinine, Ser: 0.9 mg/dL (ref 0.57–1.00)
Glucose: 99 mg/dL (ref 65–99)
Potassium: 3.9 mmol/L (ref 3.5–5.2)
Sodium: 136 mmol/L (ref 134–144)
eGFR: 78 mL/min/{1.73_m2} (ref >59)

## 2021-04-07 LAB — LIPID PANEL
Chol/HDL Ratio: 3.5 ratio (ref 0.0–4.4)
Cholesterol, Total: 183 mg/dL (ref 100–199)
HDL: 52 mg/dL (ref >39)
LDL Chol Calc (NIH): 112 mg/dL — ABNORMAL HIGH (ref 0–99)
Triglycerides: 103 mg/dL (ref 0–149)
VLDL Cholesterol Cal: 19 mg/dL (ref 5–40)

## 2021-04-07 LAB — TSH: TSH: 6.42 u[IU]/mL — ABNORMAL HIGH (ref 0.450–4.500)

## 2021-04-07 MED ORDER — ATORVASTATIN CALCIUM 40 MG PO TABS: 40.0000 mg | ORAL_TABLET | Freq: Every day | ORAL | 3 refills | Status: DC

## 2021-04-07 MED ORDER — LEVOTHYROXINE SODIUM 75 MCG PO TABS
75.0000 ug | ORAL_TABLET | Freq: Every day | ORAL | 1 refills | Status: DC
Start: 2021-04-07 — End: 2021-10-11

## 2021-04-07 MED ORDER — DULOXETINE HCL 60 MG PO CPEP: 60.0000 mg | ORAL_CAPSULE | Freq: Every day | ORAL | 1 refills | Status: DC

## 2021-04-07 NOTE — Progress Notes (Signed)
Patient ID: Lisa Newton, female   DOB: 1971/08/18, 50 y.o.   MRN: [id number] I discussed lab result with her.   Cholesterol - LDL improved but still elevated. She is compliant with her current dose of Lipitor. We will increase dose and recheck in 6 months. She agreed with the plan.  TSH worsened. She is compliant with Synthroid 50 mcg. Plan is to increase dose and recheck in 4-8 weeks. She agreed with the plan.

## 2021-04-07 NOTE — Addendum Note (Signed)
Addended by: Janit Pagan T on: 04/07/2021 09:19 AM   Modules accepted: Orders

## 2021-04-13 ENCOUNTER — Telehealth: Payer: Self-pay

## 2021-04-13 NOTE — Telephone Encounter (Signed)
Could you please give the form to Dr. Leveda Anna to review? WHich medication are you referring to? Thanks.

## 2021-04-13 NOTE — Telephone Encounter (Signed)
Hello Dr.Eniola, Dr.Hensel is reviewing it now for you.

## 2021-04-13 NOTE — Telephone Encounter (Signed)
Received fax from [employer] stating that 2 of the medications that the pt Lisa Newton  is on may have to be discontinued. I placed the fax in your box to be advised for any changes that you would like to make.

## 2021-04-15 ENCOUNTER — Ambulatory Visit: Payer: Medicare Other | Admitting: Specialist

## 2021-04-27 ENCOUNTER — Ambulatory Visit: Payer: Medicare Other | Admitting: Family Medicine

## 2021-04-30 NOTE — Pre-Procedure Instructions (Signed)
Surgical Instructions:    Your procedure is scheduled on Wednesday, May 25th (09:50 AM- 12:14 AM).  Report to [facility] Main Entrance "A" at 07:50 A.M., then check in with the Admitting office.  Call this number if you have any questions prior to, or have any problems the morning of surgery:  [phone]    Remember:  Do not eat or drink after midnight the night before your surgery.     Take these medicines the morning of surgery with A SIP OF WATER: atorvastatin (LIPITOR) levothyroxine (SYNTHROID) metoprolol succinate (TOPROL-XL)  IF NEEDED: cyclobenzaprine (FLEXERIL) oxyCODONE-acetaminophen (PERCOCET)   As of today, STOP taking any Aspirin (unless otherwise instructed by your surgeon) Aleve, Naproxen, Ibuprofen, Motrin, Advil, Goody's, BC's, all herbal medications, fish oil, and all vitamins.             Special instructions:   [place]- Preparing For Surgery  Before surgery, you can play an important role. Because skin is not sterile, your skin needs to be as free of germs as possible. You can reduce the number of germs on your skin by washing with CHG (chlorahexidine gluconate) Soap before surgery.  CHG is an antiseptic cleaner which kills germs and bonds with the skin to continue killing germs even after washing.    Oral Hygiene is also important to reduce your risk of infection.  Remember - BRUSH YOUR TEETH THE MORNING OF SURGERY WITH YOUR REGULAR TOOTHPASTE  Please do not use if you have an allergy to CHG or antibacterial soaps. If your skin becomes reddened/irritated stop using the CHG.  Do not shave (including legs and underarms) for at least 48 hours prior to first CHG shower. It is OK to shave your face.  Please follow these instructions carefully.   1. Shower the NIGHT BEFORE SURGERY and the MORNING OF SURGERY  2. If you chose to wash your hair, wash your hair first as usual with your normal shampoo.  3. After you shampoo, rinse your hair and body  thoroughly to remove the shampoo.  4. Wash Face and genitals (private parts) with your normal soap.   5. Use CHG Soap as you would any other liquid soap. You can apply CHG directly to the skin and wash gently with a scrungie or a clean washcloth.   6. Apply the CHG Soap to your body ONLY FROM THE NECK DOWN.  Do not use on open wounds or open sores. Avoid contact with your eyes, ears, mouth and genitals (private parts). Wash Face and genitals (private parts)  with your normal soap.   7. Wash thoroughly, paying special attention to the area where your surgery will be performed.  8. Thoroughly rinse your body with warm water from the neck down.  9. DO NOT shower/wash with your normal soap after using and rinsing off the CHG Soap.  10. Pat yourself dry with a CLEAN TOWEL.  11. Wear CLEAN PAJAMAS to bed the night before surgery.  12. Place CLEAN SHEETS on your bed the night before your surgery.  13. DO NOT SLEEP WITH PETS.   Day of Surgery: SHOWER with CHG soap. Brush your teeth WITH YOUR REGULAR TOOTHPASTE. Wear Clean/Comfortable clothing the morning of surgery. Do not apply any deodorants/lotions.   Do not wear jewelry, make up, or nail polish. Do not shave 48 hours prior to surgery.   Do not bring valuables to the hospital. [facility] is not responsible for any belongings or valuables.   Do NOT Smoke (Tobacco/Vaping) or  drink Alcohol 24 hours prior to your procedure.  If you use a CPAP at night, you may bring all equipment for your overnight stay.   Contacts, glasses, or dentures may not be worn into surgery, please bring cases for these belongings.   For patients admitted to the hospital, discharge time will be determined by your treatment team.   Patients discharged the day of surgery will not be allowed to drive home, and someone needs to stay with them for 24 hours.    Please read over the following fact sheets that you were given.

## 2021-05-03 ENCOUNTER — Other Ambulatory Visit: Payer: Self-pay

## 2021-05-03 ENCOUNTER — Encounter (HOSPITAL_COMMUNITY)
Admission: RE | Admit: 2021-05-03 | Discharge: 2021-05-03 | Disposition: A | Discharge status: Home / Self Care | Payer: Medicare Other | Source: Ambulatory Visit | Attending: Neurosurgery | Admitting: Neurosurgery

## 2021-05-03 ENCOUNTER — Encounter (HOSPITAL_COMMUNITY): Payer: Self-pay

## 2021-05-03 DIAGNOSIS — Z20822 Contact with and (suspected) exposure to covid-19: Secondary | ICD-10-CM | POA: Insufficient documentation

## 2021-05-03 DIAGNOSIS — Z01818 Encounter for other preprocedural examination: Secondary | ICD-10-CM | POA: Insufficient documentation

## 2021-05-03 HISTORY — DX: Hypothyroidism, unspecified: E03.9

## 2021-05-03 LAB — SURGICAL PCR SCREEN
MRSA, PCR: NEGATIVE
Staphylococcus aureus: NEGATIVE

## 2021-05-03 LAB — CBC
HCT: 38.2 % (ref 36.0–46.0)
Hemoglobin: 12.5 g/dL (ref 12.0–15.0)
MCH: 31.5 pg (ref 26.0–34.0)
MCHC: 32.7 g/dL (ref 30.0–36.0)
MCV: 96.2 fL (ref 80.0–100.0)
Platelets: 263 10*3/uL (ref 150–400)
RBC: 3.97 MIL/uL (ref 3.87–5.11)
RDW: 13.5 % (ref 11.5–15.5)
WBC: 12.2 10*3/uL — ABNORMAL HIGH (ref 4.0–10.5)
nRBC: 0 % (ref 0.0–0.2)

## 2021-05-03 LAB — BASIC METABOLIC PANEL
Anion gap: 6 (ref 5–15)
BUN: 10 mg/dL (ref 6–20)
CO2: 29 mmol/L (ref 22–32)
Calcium: 9.4 mg/dL (ref 8.9–10.3)
Chloride: 101 mmol/L (ref 98–111)
Creatinine, Ser: 0.89 mg/dL (ref 0.44–1.00)
GFR, Estimated: >60 mL/min (ref >60)
Glucose, Bld: 88 mg/dL (ref 70–99)
Potassium: 3.4 mmol/L — ABNORMAL LOW (ref 3.5–5.1)
Sodium: 136 mmol/L (ref 135–145)

## 2021-05-03 LAB — SARS CORONAVIRUS 2 (TAT 6-24 HRS): SARS Coronavirus 2: NEGATIVE

## 2021-05-03 NOTE — Progress Notes (Signed)
PCP - Dr. Janit Pagan Cardiologist - denies  Chest x-ray - 05/27/20 EKG - 05/03/21 Stress Test - denies ECHO - denies Cardiac Cath - denies  Sleep Study - denies CPAP - denies  Blood Thinner Instructions: n/a Aspirin Instructions: n/a  COVID TEST- 05/03/21   Anesthesia review:   Patient denies shortness of breath, fever, cough and chest pain at PAT appointment   All instructions explained to the patient, with a verbal understanding of the material. Patient agrees to go over the instructions while at home for a better understanding. Patient also instructed to self quarantine after being tested for COVID-19. The opportunity to ask questions was provided.

## 2021-05-04 ENCOUNTER — Other Ambulatory Visit (HOSPITAL_COMMUNITY): Payer: Medicare Other

## 2021-05-05 ENCOUNTER — Observation Stay (HOSPITAL_COMMUNITY)
Admission: RE | Admit: 2021-05-05 | Discharge: 2021-05-06 | Disposition: A | Discharge status: Home / Self Care | Payer: Medicare Other | Attending: Neurosurgery | Admitting: Neurosurgery

## 2021-05-05 ENCOUNTER — Encounter (HOSPITAL_COMMUNITY): Payer: Self-pay | Admitting: Neurosurgery

## 2021-05-05 ENCOUNTER — Ambulatory Visit (HOSPITAL_COMMUNITY): Payer: Medicare Other

## 2021-05-05 ENCOUNTER — Encounter (HOSPITAL_COMMUNITY)
Admission: RE | Disposition: A | Discharge status: Home / Self Care | Payer: Self-pay | Source: Home / Self Care | Attending: Neurosurgery

## 2021-05-05 ENCOUNTER — Other Ambulatory Visit (HOSPITAL_COMMUNITY): Payer: Self-pay | Admitting: Neurosurgery

## 2021-05-05 DIAGNOSIS — Z91040 Latex allergy status: Secondary | ICD-10-CM | POA: Diagnosis not present

## 2021-05-05 DIAGNOSIS — Z79899 Other long term (current) drug therapy: Secondary | ICD-10-CM | POA: Insufficient documentation

## 2021-05-05 DIAGNOSIS — M5020 Other cervical disc displacement, unspecified cervical region: Secondary | ICD-10-CM | POA: Diagnosis present

## 2021-05-05 DIAGNOSIS — Z419 Encounter for procedure for purposes other than remedying health state, unspecified: Secondary | ICD-10-CM

## 2021-05-05 DIAGNOSIS — I10 Essential (primary) hypertension: Secondary | ICD-10-CM | POA: Insufficient documentation

## 2021-05-05 DIAGNOSIS — M50223 Other cervical disc displacement at C6-C7 level: Principal | ICD-10-CM | POA: Insufficient documentation

## 2021-05-05 DIAGNOSIS — E039 Hypothyroidism, unspecified: Secondary | ICD-10-CM | POA: Diagnosis not present

## 2021-05-05 DIAGNOSIS — R2681 Unsteadiness on feet: Secondary | ICD-10-CM | POA: Diagnosis not present

## 2021-05-05 HISTORY — PX: ANTERIOR CERVICAL DECOMP/DISCECTOMY FUSION: SHX1161

## 2021-05-05 LAB — GLUCOSE, CAPILLARY: Glucose-Capillary: 107 mg/dL — ABNORMAL HIGH (ref 70–99)

## 2021-05-05 LAB — POCT PREGNANCY, URINE: Preg Test, Ur: NEGATIVE

## 2021-05-05 SURGERY — ANTERIOR CERVICAL DECOMPRESSION/DISCECTOMY FUSION 1 LEVEL
Anesthesia: General

## 2021-05-05 MED ORDER — SODIUM CHLORIDE 0.9 % IV SOLN
250.0000 mL | INTRAVENOUS | Status: DC
  Administered 2021-05-05: 250 mL via INTRAVENOUS

## 2021-05-05 MED ORDER — LOSARTAN POTASSIUM-HCTZ 50-12.5 MG PO TABS: 1.0000 | ORAL_TABLET | Freq: Every day | ORAL | Status: DC

## 2021-05-05 MED ORDER — KETAMINE HCL 10 MG/ML IJ SOLN
INTRAMUSCULAR | Status: DC | PRN
  Administered 2021-05-05: 30 mg via INTRAVENOUS

## 2021-05-05 MED ORDER — METOPROLOL SUCCINATE ER 50 MG PO TB24
50.0000 mg | ORAL_TABLET | Freq: Every day | ORAL | Status: DC
  Administered 2021-05-06: 50 mg via ORAL
  Filled 2021-05-05: qty 1

## 2021-05-05 MED ORDER — THROMBIN 5000 UNITS EX SOLR
CUTANEOUS | Status: AC
  Filled 2021-05-05: qty 10000

## 2021-05-05 MED ORDER — HYDROMORPHONE HCL 1 MG/ML IJ SOLN
0.2500 mg | INTRAMUSCULAR | Status: DC | PRN
  Administered 2021-05-05 (×2): 0.5 mg via INTRAVENOUS

## 2021-05-05 MED ORDER — VANCOMYCIN HCL IN DEXTROSE 1-5 GM/200ML-% IV SOLN
INTRAVENOUS | Status: AC
  Filled 2021-05-05: qty 200

## 2021-05-05 MED ORDER — KETAMINE HCL 50 MG/5ML IJ SOSY
PREFILLED_SYRINGE | INTRAMUSCULAR | Status: AC
  Filled 2021-05-05: qty 5

## 2021-05-05 MED ORDER — ROCURONIUM BROMIDE 10 MG/ML (PF) SYRINGE
PREFILLED_SYRINGE | INTRAVENOUS | Status: AC
  Filled 2021-05-05: qty 10

## 2021-05-05 MED ORDER — PROPOFOL 10 MG/ML IV BOLUS
INTRAVENOUS | Status: AC
  Filled 2021-05-05: qty 20

## 2021-05-05 MED ORDER — OXYCODONE HCL ER 15 MG PO T12A
15.0000 mg | EXTENDED_RELEASE_TABLET | Freq: Two times a day (BID) | ORAL | Status: DC
Start: 2021-05-05 — End: 2021-05-06
  Administered 2021-05-05 – 2021-05-06 (×3): 15 mg via ORAL
  Filled 2021-05-05 (×3): qty 1

## 2021-05-05 MED ORDER — OXYCODONE HCL 5 MG PO TABS
5.0000 mg | ORAL_TABLET | ORAL | Status: DC | PRN
Start: 2021-05-05 — End: 2021-05-06

## 2021-05-05 MED ORDER — PROPOFOL 10 MG/ML IV BOLUS
INTRAVENOUS | Status: DC | PRN
  Administered 2021-05-05: 160 mg via INTRAVENOUS

## 2021-05-05 MED ORDER — CYCLOBENZAPRINE HCL 10 MG PO TABS
10.0000 mg | ORAL_TABLET | Freq: Three times a day (TID) | ORAL | Status: DC | PRN
  Administered 2021-05-05 (×2): 10 mg via ORAL
  Filled 2021-05-05 (×2): qty 1

## 2021-05-05 MED ORDER — ONDANSETRON HCL 4 MG PO TABS: 4.0000 mg | ORAL_TABLET | Freq: Four times a day (QID) | ORAL | Status: DC | PRN

## 2021-05-05 MED ORDER — 0.9 % SODIUM CHLORIDE (POUR BTL) OPTIME
TOPICAL | Status: DC | PRN
  Administered 2021-05-05: 1000 mL

## 2021-05-05 MED ORDER — PROMETHAZINE HCL 25 MG/ML IJ SOLN: 6.2500 mg | INTRAMUSCULAR | Status: DC | PRN

## 2021-05-05 MED ORDER — ONDANSETRON HCL 4 MG/2ML IJ SOLN: 4.0000 mg | Freq: Four times a day (QID) | INTRAMUSCULAR | Status: DC | PRN

## 2021-05-05 MED ORDER — LEVOTHYROXINE SODIUM 75 MCG PO TABS
75.0000 ug | ORAL_TABLET | Freq: Every day | ORAL | Status: DC
  Administered 2021-05-06: 75 ug via ORAL
  Filled 2021-05-05: qty 1

## 2021-05-05 MED ORDER — CYCLOBENZAPRINE HCL 10 MG PO TABS
ORAL_TABLET | ORAL | Status: AC
  Filled 2021-05-05: qty 1

## 2021-05-05 MED ORDER — FENTANYL CITRATE (PF) 100 MCG/2ML IJ SOLN
INTRAMUSCULAR | Status: DC | PRN
  Administered 2021-05-05: 50 ug via INTRAVENOUS
  Administered 2021-05-05: 100 ug via INTRAVENOUS
  Administered 2021-05-05 (×2): 50 ug via INTRAVENOUS

## 2021-05-05 MED ORDER — MENTHOL 3 MG MT LOZG
1.0000 | LOZENGE | OROMUCOSAL | Status: DC | PRN
  Filled 2021-05-05: qty 9

## 2021-05-05 MED ORDER — HYDROMORPHONE HCL 1 MG/ML IJ SOLN
INTRAMUSCULAR | Status: AC
  Filled 2021-05-05: qty 1

## 2021-05-05 MED ORDER — FENTANYL CITRATE (PF) 100 MCG/2ML IJ SOLN
25.0000 ug | INTRAMUSCULAR | Status: DC | PRN
  Administered 2021-05-05 (×2): 50 ug via INTRAVENOUS

## 2021-05-05 MED ORDER — LABETALOL HCL 5 MG/ML IV SOLN
INTRAVENOUS | Status: AC
  Filled 2021-05-05: qty 4

## 2021-05-05 MED ORDER — PHENYLEPHRINE 40 MCG/ML (10ML) SYRINGE FOR IV PUSH (FOR BLOOD PRESSURE SUPPORT)
PREFILLED_SYRINGE | INTRAVENOUS | Status: DC | PRN
  Administered 2021-05-05 (×5): 80 ug via INTRAVENOUS

## 2021-05-05 MED ORDER — LACTATED RINGERS IV SOLN: INTRAVENOUS | Status: DC

## 2021-05-05 MED ORDER — SUGAMMADEX SODIUM 200 MG/2ML IV SOLN
INTRAVENOUS | Status: DC | PRN
  Administered 2021-05-05: 200 mg via INTRAVENOUS

## 2021-05-05 MED ORDER — ACETAMINOPHEN 650 MG RE SUPP: 650.0000 mg | RECTAL | Status: DC | PRN

## 2021-05-05 MED ORDER — FENTANYL CITRATE (PF) 100 MCG/2ML IJ SOLN
INTRAMUSCULAR | Status: AC
  Filled 2021-05-05: qty 2

## 2021-05-05 MED ORDER — ONDANSETRON HCL 4 MG/2ML IJ SOLN
INTRAMUSCULAR | Status: DC | PRN
  Administered 2021-05-05: 4 mg via INTRAVENOUS

## 2021-05-05 MED ORDER — LIDOCAINE 2% (20 MG/ML) 5 ML SYRINGE
INTRAMUSCULAR | Status: DC | PRN
  Administered 2021-05-05: 60 mg via INTRAVENOUS

## 2021-05-05 MED ORDER — ATORVASTATIN CALCIUM 40 MG PO TABS
40.0000 mg | ORAL_TABLET | Freq: Every day | ORAL | Status: DC
  Administered 2021-05-06: 40 mg via ORAL
  Filled 2021-05-05: qty 1

## 2021-05-05 MED ORDER — VITAMIN B-12 1000 MCG PO TABS
1000.0000 ug | ORAL_TABLET | Freq: Every day | ORAL | Status: DC
  Administered 2021-05-05 – 2021-05-06 (×2): 1000 ug via ORAL
  Filled 2021-05-05 (×2): qty 1

## 2021-05-05 MED ORDER — FENTANYL CITRATE (PF) 100 MCG/2ML IJ SOLN: 50.0000 ug | Freq: Once | INTRAMUSCULAR | Status: AC

## 2021-05-05 MED ORDER — SODIUM CHLORIDE 0.9% FLUSH: 3.0000 mL | INTRAVENOUS | Status: DC | PRN

## 2021-05-05 MED ORDER — HYDROCHLOROTHIAZIDE 12.5 MG PO CAPS
12.5000 mg | ORAL_CAPSULE | Freq: Every day | ORAL | Status: DC
  Administered 2021-05-05 – 2021-05-06 (×2): 12.5 mg via ORAL
  Filled 2021-05-05 (×2): qty 1

## 2021-05-05 MED ORDER — FENTANYL CITRATE (PF) 100 MCG/2ML IJ SOLN
INTRAMUSCULAR | Status: AC
  Administered 2021-05-05: 50 ug via INTRAVENOUS
  Filled 2021-05-05: qty 2

## 2021-05-05 MED ORDER — DEXAMETHASONE SODIUM PHOSPHATE 10 MG/ML IJ SOLN
INTRAMUSCULAR | Status: DC | PRN
  Administered 2021-05-05: 6 mg via INTRAVENOUS
  Administered 2021-05-05: 4 mg via INTRAVENOUS

## 2021-05-05 MED ORDER — LOSARTAN POTASSIUM 50 MG PO TABS
50.0000 mg | ORAL_TABLET | Freq: Every day | ORAL | Status: DC
  Administered 2021-05-05 – 2021-05-06 (×2): 50 mg via ORAL
  Filled 2021-05-05 (×2): qty 1

## 2021-05-05 MED ORDER — ROCURONIUM BROMIDE 10 MG/ML (PF) SYRINGE
PREFILLED_SYRINGE | INTRAVENOUS | Status: DC | PRN
  Administered 2021-05-05: 40 mg via INTRAVENOUS
  Administered 2021-05-05: 60 mg via INTRAVENOUS

## 2021-05-05 MED ORDER — HYDROMORPHONE HCL 1 MG/ML IJ SOLN
0.2500 mg | INTRAMUSCULAR | Status: DC | PRN
Start: 2021-05-05 — End: 2021-05-05
  Administered 2021-05-05 (×2): 0.5 mg via INTRAVENOUS

## 2021-05-05 MED ORDER — MIDAZOLAM HCL 2 MG/2ML IJ SOLN
INTRAMUSCULAR | Status: AC
  Filled 2021-05-05: qty 2

## 2021-05-05 MED ORDER — OXYCODONE HCL 5 MG/5ML PO SOLN: 5.0000 mg | Freq: Once | ORAL | Status: DC | PRN

## 2021-05-05 MED ORDER — CHLORHEXIDINE GLUCONATE 0.12 % MT SOLN
OROMUCOSAL | Status: AC
  Filled 2021-05-05: qty 15

## 2021-05-05 MED ORDER — LABETALOL HCL 5 MG/ML IV SOLN
10.0000 mg | Freq: Once | INTRAVENOUS | Status: AC
  Administered 2021-05-05: 10 mg via INTRAVENOUS

## 2021-05-05 MED ORDER — ZOLPIDEM TARTRATE 5 MG PO TABS: 5.0000 mg | ORAL_TABLET | Freq: Every evening | ORAL | Status: DC | PRN

## 2021-05-05 MED ORDER — CHLORHEXIDINE GLUCONATE CLOTH 2 % EX PADS: 6.0000 | MEDICATED_PAD | Freq: Once | CUTANEOUS | Status: DC

## 2021-05-05 MED ORDER — PHENYLEPHRINE 40 MCG/ML (10ML) SYRINGE FOR IV PUSH (FOR BLOOD PRESSURE SUPPORT)
PREFILLED_SYRINGE | INTRAVENOUS | Status: AC
  Filled 2021-05-05: qty 10

## 2021-05-05 MED ORDER — FENTANYL CITRATE (PF) 250 MCG/5ML IJ SOLN
INTRAMUSCULAR | Status: AC
  Filled 2021-05-05: qty 5

## 2021-05-05 MED ORDER — HYDROMORPHONE HCL 1 MG/ML IJ SOLN: 0.5000 mg | INTRAMUSCULAR | Status: DC | PRN

## 2021-05-05 MED ORDER — THROMBIN 5000 UNITS EX SOLR
CUTANEOUS | Status: DC | PRN
  Administered 2021-05-05 (×2): 5000 [IU] via TOPICAL

## 2021-05-05 MED ORDER — LIDOCAINE-EPINEPHRINE 0.5 %-1:200000 IJ SOLN
INTRAMUSCULAR | Status: AC
  Filled 2021-05-05: qty 1

## 2021-05-05 MED ORDER — PHENOL 1.4 % MT LIQD
1.0000 | OROMUCOSAL | Status: DC | PRN
  Administered 2021-05-05: 1 via OROMUCOSAL
  Filled 2021-05-05: qty 177

## 2021-05-05 MED ORDER — MIDAZOLAM HCL 2 MG/2ML IJ SOLN
INTRAMUSCULAR | Status: DC | PRN
  Administered 2021-05-05: 2 mg via INTRAVENOUS

## 2021-05-05 MED ORDER — SODIUM CHLORIDE 0.9% FLUSH
3.0000 mL | Freq: Two times a day (BID) | INTRAVENOUS | Status: DC
  Administered 2021-05-05: 3 mL via INTRAVENOUS

## 2021-05-05 MED ORDER — ACETAMINOPHEN 325 MG PO TABS: 650.0000 mg | ORAL_TABLET | ORAL | Status: DC | PRN

## 2021-05-05 MED ORDER — PHENYLEPHRINE HCL-NACL 10-0.9 MG/250ML-% IV SOLN
INTRAVENOUS | Status: DC | PRN
  Administered 2021-05-05: 25 ug/min via INTRAVENOUS

## 2021-05-05 MED ORDER — DEXAMETHASONE SODIUM PHOSPHATE 10 MG/ML IJ SOLN
INTRAMUSCULAR | Status: AC
  Filled 2021-05-05: qty 1

## 2021-05-05 MED ORDER — POTASSIUM CHLORIDE IN NACL 20-0.9 MEQ/L-% IV SOLN: INTRAVENOUS | Status: DC

## 2021-05-05 MED ORDER — HEMOSTATIC AGENTS (NO CHARGE) OPTIME
TOPICAL | Status: DC | PRN
  Administered 2021-05-05: 1 via TOPICAL

## 2021-05-05 MED ORDER — OXYCODONE HCL 5 MG PO TABS: 5.0000 mg | ORAL_TABLET | Freq: Once | ORAL | Status: DC | PRN

## 2021-05-05 MED ORDER — ORAL CARE MOUTH RINSE: 15.0000 mL | Freq: Once | OROMUCOSAL | Status: DC

## 2021-05-05 MED ORDER — LIDOCAINE-EPINEPHRINE 0.5 %-1:200000 IJ SOLN
INTRAMUSCULAR | Status: DC | PRN
  Administered 2021-05-05: 7 mL

## 2021-05-05 MED ORDER — CHLORHEXIDINE GLUCONATE 0.12 % MT SOLN: 15.0000 mL | Freq: Once | OROMUCOSAL | Status: DC

## 2021-05-05 MED ORDER — OXYCODONE HCL 5 MG PO TABS
10.0000 mg | ORAL_TABLET | ORAL | Status: DC | PRN
  Administered 2021-05-05 – 2021-05-06 (×6): 10 mg via ORAL
  Filled 2021-05-05 (×6): qty 2

## 2021-05-05 MED ORDER — ONDANSETRON HCL 4 MG/2ML IJ SOLN
INTRAMUSCULAR | Status: AC
  Filled 2021-05-05: qty 2

## 2021-05-05 MED ORDER — ACETAMINOPHEN 500 MG PO TABS
1000.0000 mg | ORAL_TABLET | Freq: Four times a day (QID) | ORAL | Status: AC
  Administered 2021-05-05 – 2021-05-06 (×4): 1000 mg via ORAL
  Filled 2021-05-05 (×4): qty 2

## 2021-05-05 MED ORDER — VANCOMYCIN HCL 1500 MG/300ML IV SOLN
1500.0000 mg | INTRAVENOUS | Status: AC
  Administered 2021-05-05: 1500 mg via INTRAVENOUS
  Filled 2021-05-05: qty 300

## 2021-05-05 MED ORDER — LIDOCAINE 2% (20 MG/ML) 5 ML SYRINGE
INTRAMUSCULAR | Status: AC
  Filled 2021-05-05: qty 5

## 2021-05-05 SURGICAL SUPPLY — 51 items
ADH SKN CLS APL DERMABOND .7 (GAUZE/BANDAGES/DRESSINGS) ×1
BAND INSRT 18 STRL LF DISP RB (MISCELLANEOUS) ×2
BAND RUBBER #18 3X1/16 STRL (MISCELLANEOUS) ×4 IMPLANT
BLADE CLIPPER SURG (BLADE) IMPLANT
BUR DRUM 4.0 (BURR) ×2 IMPLANT
BUR MATCHSTICK NEURO 3.0 LAGG (BURR) ×2 IMPLANT
CANISTER SUCT 3000ML PPV (MISCELLANEOUS) ×2 IMPLANT
CARTRIDGE OIL MAESTRO DRILL (MISCELLANEOUS) ×1 IMPLANT
COVER WAND RF STERILE (DRAPES) ×2 IMPLANT
DECANTER SPIKE VIAL GLASS SM (MISCELLANEOUS) ×2 IMPLANT
DERMABOND ADVANCED (GAUZE/BANDAGES/DRESSINGS) ×1
DERMABOND ADVANCED .7 DNX12 (GAUZE/BANDAGES/DRESSINGS) ×1 IMPLANT
DIFFUSER DRILL AIR PNEUMATIC (MISCELLANEOUS) ×2 IMPLANT
DRAPE HALF SHEET 40X57 (DRAPES) IMPLANT
DRAPE LAPAROTOMY 100X72 PEDS (DRAPES) ×2 IMPLANT
DRAPE MICROSCOPE LEICA (MISCELLANEOUS) ×2 IMPLANT
DURAPREP 6ML APPLICATOR 50/CS (WOUND CARE) ×2 IMPLANT
ELECT COATED BLADE 2.86 ST (ELECTRODE) ×2 IMPLANT
ELECT REM PT RETURN 9FT ADLT (ELECTROSURGICAL) ×2
ELECTRODE REM PT RTRN 9FT ADLT (ELECTROSURGICAL) ×1 IMPLANT
GAUZE 4X4 16PLY RFD (DISPOSABLE) IMPLANT
GLOVE ECLIPSE 6.5 STRL STRAW (GLOVE) ×1 IMPLANT
GLOVE EXAM NITRILE XL STR (GLOVE) IMPLANT
GLOVE SURG UNDER POLY LF SZ6.5 (GLOVE) ×1 IMPLANT
GOWN STRL REUS W/ TWL LRG LVL3 (GOWN DISPOSABLE) ×2 IMPLANT
GOWN STRL REUS W/ TWL XL LVL3 (GOWN DISPOSABLE) IMPLANT
GOWN STRL REUS W/TWL 2XL LVL3 (GOWN DISPOSABLE) ×1 IMPLANT
GOWN STRL REUS W/TWL LRG LVL3 (GOWN DISPOSABLE) ×2
GOWN STRL REUS W/TWL XL LVL3 (GOWN DISPOSABLE)
GRAFT CORT CANC 14X8.25X11 5D (Bone Implant) ×1 IMPLANT
KIT BASIN OR (CUSTOM PROCEDURE TRAY) ×2 IMPLANT
KIT TURNOVER KIT B (KITS) ×2 IMPLANT
NDL HYPO 25X1 1.5 SAFETY (NEEDLE) ×1 IMPLANT
NDL SPNL 22GX3.5 QUINCKE BK (NEEDLE) ×1 IMPLANT
NEEDLE HYPO 25X1 1.5 SAFETY (NEEDLE) ×2 IMPLANT
NEEDLE SPNL 22GX3.5 QUINCKE BK (NEEDLE) ×6 IMPLANT
NS IRRIG 1000ML POUR BTL (IV SOLUTION) ×2 IMPLANT
OIL CARTRIDGE MAESTRO DRILL (MISCELLANEOUS) ×2
PACK LAMINECTOMY NEURO (CUSTOM PROCEDURE TRAY) ×2 IMPLANT
PAD ARMBOARD 7.5X6 YLW CONV (MISCELLANEOUS) ×3 IMPLANT
PIN DISTRACTION 14MM (PIN) IMPLANT
PLATE ACP 1-LEVEL1.6V22 (Plate) ×1 IMPLANT
SCREW ACP ST VARI 3.5X13 (Screw) ×4 IMPLANT
SPONGE INTESTINAL PEANUT (DISPOSABLE) ×2 IMPLANT
SPONGE SURGIFOAM ABS GEL SZ50 (HEMOSTASIS) ×2 IMPLANT
SUT VIC AB 0 CT1 27 (SUTURE)
SUT VIC AB 0 CT1 27XBRD ANTBC (SUTURE) IMPLANT
SUT VIC AB 3-0 SH 8-18 (SUTURE) ×2 IMPLANT
TOWEL GREEN STERILE (TOWEL DISPOSABLE) ×2 IMPLANT
TOWEL GREEN STERILE FF (TOWEL DISPOSABLE) ×2 IMPLANT
WATER STERILE IRR 1000ML POUR (IV SOLUTION) ×2 IMPLANT

## 2021-05-05 NOTE — Progress Notes (Signed)
Orthopedic Tech Progress Note Patient Details:  Lisa Newton Lisa Newton [birth year] [id number]  Ortho Devices Type of Ortho Device: Soft collar Ortho Device/Splint Location: NECK Ortho Device/Splint Interventions: Ordered,Application   Post Interventions Patient Tolerated: Well Instructions Provided: Care of device   Donald Pore 05/05/2021, 1:26 PM

## 2021-05-05 NOTE — Anesthesia Procedure Notes (Signed)
Procedure Name: Intubation Date/Time: 05/05/2021 9:33 AM Performed by: Pearson Grippe, CRNA Pre-anesthesia Checklist: Patient identified, Emergency Drugs available, Suction available and Patient being monitored Patient Re-evaluated:Patient Re-evaluated prior to induction Oxygen Delivery Method: Circle system utilized Preoxygenation: Pre-oxygenation with 100% oxygen Induction Type: IV induction Ventilation: Two handed mask ventilation required and Oral airway inserted - appropriate to patient size Laryngoscope Size: Glidescope and 4 Grade View: Grade I Tube type: Oral Tube size: 7.0 mm Number of attempts: 1 Airway Equipment and Method: Oral airway,  Rigid stylet and Video-laryngoscopy Placement Confirmation: ETT inserted through vocal cords under direct vision,  positive ETCO2 and breath sounds checked- equal and bilateral Secured at: 23 cm Tube secured with: Tape Dental Injury: Teeth and Oropharynx as per pre-operative assessment  Difficulty Due To: Difficulty was anticipated and Difficult Airway- due to reduced neck mobility Comments: Elective Glidescope intubation d/t reduced neck ROM.

## 2021-05-05 NOTE — H&P (Signed)
BP (!) 163/85   Pulse 64   Temp 98.4 F (36.9 C) (Oral)   Resp 18   Ht 5\' 6"  (1.676 m)   Wt 101.2 kg   SpO2 100%   BMI 35.99 kg/m '    Lisa Newton comes in today secondary to pain that she is having in her left upper extremity and in her left lower back.  I took her to the operating room for a lumbar disc herniation many years ago, in 2016.  She did get better from that and has actually had a few MRIs afterward, as she would intermittently report pain in the lumbar region.  None of those scans revealed any problems.  She also underwent a posterior cervical operation performed by Dr. 2017.  That operation did result in improvement at that time.  She comes in today because she is having back pain.  An MRI of the cervical spine showed a very large disc herniation at C6-7, where she had undergone a previous posterior operation performed by Dr. Otelia Sergeant.  She does have pain in the left upper extremity.  On exam today, however, she had no weakness.  Triceps was quite good.  She weighs 229 pounds.   She is alert, oriented x 4, answers all questions appropriately.  Memory, language, attention span, and fund of knowledge are normal.  She is also having a great deal of pain in the left lower extremity.    Allergies  Allergen Reactions  . Augmentin [Amoxicillin-Pot Clavulanate] Hives and Other (See Comments)    Took Bactrim and Augmentin together and broke out in hives.Otelia SergeantMarland KitchenPt says penicillin/amoxicillin previously without any problems.the patient is unable to answer any penicillin questions as it is unknown exactly what medication broke her out in hives.  . Bactrim [Sulfamethoxazole-Trimethoprim] Hives and Other (See Comments)    Took Bactrim and Augmentin together and broke out in hives.Marland KitchenMarland KitchenPt says she took bactrim previously without any problems, she did state that she had just been released after a course of vancomycin iv and didn't know if that hadn't affected the bactrim.   . Latex Hives  . Other      Gadolinium- headaches  . Pollen Extract     UNSPECIFIED REACTION   . Tape Rash   Past Medical History:  Diagnosis Date  . Abnormal MRI, cervical spine (07/25/2018) 09/03/2018   Disc levels: C2-3: The disc appears normal. Stable mild bilateral facet hypertrophy. No spinal stenosis or nerve root encroachment. C3-4: Stable mild uncinate spurring and facet hypertrophy. No spinal stenosis or nerve root encroachment. C4-5: Stable chronic spondylosis with posterior osteophytes covering diffusely bulging disc material. The AP diameter of the canal is chronically narrowed to 8 mm  . Abnormal MRI, lumbar spine (07/25/2018) 09/03/2018   Disc levels: L4-5: The patient is status post right laminotomy for discectomy. There is some loss of disc space height. Fat about the descending right L4 root is obliterated in the subarticular recess. This is likely due to the presence of granulation tissue. No definite mass effect on the root is seen and there is no mass effect on the thecal sac. Foramina are open. L5-S1: Minimal disc bulge and   . Acne 03/02/2018  . Amenorrhea 11/30/2018  . Anxiety   . Arthritis   . Cervical radiculitis (Left) 12/22/2016  . Chronic headaches   . DDD (degenerative disc disease), cervical 08/06/2018  . DDD (degenerative disc disease), lumbar 08/06/2018  . Depression 01/11/2012  . Depression with anxiety 10/30/2015  . Disorder of skeletal  system 07/18/2018  . 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 Herniation of cervical intervertebral disc with radiculopathy 03/14/2017  . Hypertension   . Hypothyroidism   . Issue of medical certificate for disability examination 10/25/2013  . Left ear hearing loss   . Lumbar radiculitis (Bilateral) 08/06/2018  . Neck pain   . 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 Parotitis, acute 06/11/2018  . PONV (postoperative nausea and vomiting)   . Poor social situation 09/13/2017  . 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/2018  . Primary osteoarthritis  of lumbar spine 08/06/2018  . 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 cholesterol granuloma     left ear  . left ear surgery     ruptured TM  . LUMBAR LAMINECTOMY/DECOMPRESSION MICRODISCECTOMY Right 12/14/2017   Procedure: MICRODISCECTOMY LUMBAR FOUR- LUMBAR FIVE - RIGHT;  Surgeon: Coletta Memos, MD;  Location: [facility] OR;  Service: Neurosurgery;  Laterality: Right;  MICRODISCECTOMY LUMBAR 4- LUMBAR 5 - RIGHT  . POSTERIOR CERVICAL FUSION/FORAMINOTOMY N/A 03/14/2017   Procedure: LEFT C6-7 FORAMINOTOMY WITH EXCISION OF HERNIATED NUCLEUS PULPOSUS;  Surgeon: Kerrin Champagne, MD;  Location: [facility] OR;  Service: Orthopedics;  Laterality: N/A;   Family History  Problem Relation Age of Onset  . Lung cancer Mother 55  . Hypertension Father   . Eczema Daughter   . Asthma Neg Hx   . Urticaria Neg Hx   . Allergic rhinitis Neg Hx    Social History   Socioeconomic History  . Marital status: Single    Spouse name: Not on file  . Number of children: 3  . Years of education: some college  . Highest education level: Not on file  Occupational History  . Occupation: Unemployed  Tobacco Use  . Smoking status: Never Smoker  . Smokeless tobacco: Never Used  Vaping Use  . Vaping Use: Never used  Substance and Sexual Activity  . Alcohol use: Not Currently    Alcohol/week: 0.0 standard drinks  . Drug use: Not Currently    Types: Marijuana    Comment: 1 time in the past 6 months  . Sexual activity: Yes    Birth control/protection: None  Other Topics Concern  . Not on file  Social History Narrative   Lives at home with her daughter.   Right-handed.   Rare caffeine use.   Social Determinants of Health   Financial Resource Strain: Not on file  Food Insecurity: Not on file  Transportation Needs: Not on file  Physical Activity: Not on file  Stress: Not on file  Social Connections: Not on file  Intimate Partner Violence: Not on file   Prior to  Admission medications   Medication Sig Start Date End Date Taking? Authorizing Provider  atorvastatin (LIPITOR) 40 MG tablet Take 1 tablet (40 mg total) by mouth daily. 04/07/21  Yes Doreene Eland, MD  cyclobenzaprine (FLEXERIL) 10 MG tablet Take 10 mg by mouth 3 (three) times daily as needed for muscle spasms.   Yes [provider]  levothyroxine (SYNTHROID) 75 MCG tablet Take 1 tablet (75 mcg total) by mouth daily before breakfast. 04/07/21  Yes Eniola, Theador Hawthorne, MD  losartan-hydrochlorothiazide (HYZAAR) 50-12.5 MG tablet Take 1 tablet by mouth daily. 03/18/21  Yes Mirian Mo, MD  metoprolol succinate (TOPROL-XL) 50 MG 24 hr tablet Take 50 mg by mouth daily. Take with or immediately following a meal.   Yes [provider]  Multiple Vitamins-Minerals (HAIR/SKIN/NAILS/BIOTIN PO) Take 2  each by mouth daily.   Yes [provider]  oxyCODONE-acetaminophen (PERCOCET) 10-325 MG tablet Take 1 tablet by mouth 3 (three) times daily as needed. 04/26/21  Yes [provider]  vitamin B-12 (CYANOCOBALAMIN) 1000 MCG tablet Take 1,000 mcg by mouth daily.   Yes [provider]  benzonatate (TESSALON) 100 MG capsule Take 1 capsule (100 mg total) by mouth every 8 (eight) hours. Patient not taking: No sig reported 12/31/20   Rhys Martini, PA-C  DULoxetine (CYMBALTA) 60 MG capsule Take 1 capsule (60 mg total) by mouth daily. Patient not taking: Reported on 04/29/2021 04/07/21   Doreene Eland, MD  levocetirizine (XYZAL) 5 MG tablet Take 1 tablet (5 mg total) by mouth every evening. Patient not taking: Reported on 04/29/2021 04/06/21   Doreene Eland, MD  ondansetron (ZOFRAN) 8 MG tablet Take 1 tablet (8 mg total) by mouth every 8 (eight) hours as needed for nausea or vomiting. Patient not taking: Reported on 04/29/2021 12/31/20   Rhys Martini, PA-C  fluticasone [facility]) 50 MCG/ACT nasal spray Place 2 sprays into both nostrils daily. Patient not taking: Reported on  06/02/2020 04/25/19 09/21/20  Dollene Cleveland, DO  loratadine (CLARITIN) 10 MG tablet Take 1 tablet (10 mg total) by mouth daily. 05/27/20 09/21/20  Domenick Gong, MD  SUMAtriptan (IMITREX) 50 MG tablet TAKE 1 TABLET AS NEEDED FOR MIGRAINE HEADACHE. MAY REPEAT DOSE ONCE AFTER 2 HRS. DON'T TAKE MORE THAN 200 MG/DAY. 08/26/20 09/21/20  Doreene Eland, MD    Mrs. Cooperwood would like to proceed with operative treatment.  She wants to do this sometime after Mother's Day for the cervical disc.  I believe an ACDF would suffice and gave her an instruction sheet about that.    Strength is full in the lower extremities.  Normal muscle tone, bulk, and coordination.  Speech is clear and fluent.  Cervical wound well healed.  Lumbar wound well healed.

## 2021-05-05 NOTE — Anesthesia Preprocedure Evaluation (Addendum)
Anesthesia Evaluation  Patient identified by MRN, date of birth, ID band Patient awake    Reviewed: Allergy & Precautions, NPO status , Patient's Chart, lab work & pertinent test results, reviewed documented beta blocker date and time   History of Anesthesia Complications (+) PONV and history of anesthetic complications  Airway Mallampati: III  TM Distance: >3 FB Neck ROM: Limited    Dental  (+) Dental Advisory Given, Teeth Intact   Pulmonary neg pulmonary ROS,    Pulmonary exam normal        Cardiovascular hypertension, Pt. on medications and Pt. on home beta blockers Normal cardiovascular exam     Neuro/Psych  Headaches, PSYCHIATRIC DISORDERS Anxiety Depression  Hearing deficit     GI/Hepatic negative GI ROS, Neg liver ROS,   Endo/Other  Hypothyroidism  Obesity   Renal/GU negative Renal ROS     Musculoskeletal  (+) Arthritis ,   Abdominal   Peds  Hematology negative hematology ROS (+)   Anesthesia Other Findings Covid test negative   Reproductive/Obstetrics                            Anesthesia Physical Anesthesia Plan  ASA: II  Anesthesia Plan: General   Post-op Pain Management:    Induction: Intravenous  PONV Risk Score and Plan: 4 or greater and Treatment may vary due to age or medical condition, Ondansetron, Dexamethasone, Midazolam and Scopolamine patch - Pre-op  Airway Management Planned: Oral ETT and Video Laryngoscope Planned  Additional Equipment: None  Intra-op Plan:   Post-operative Plan: Extubation in OR  Informed Consent: I have reviewed the patients History and Physical, chart, labs and discussed the procedure including the risks, benefits and alternatives for the proposed anesthesia with the patient or authorized representative who has indicated his/her understanding and acceptance.     Dental advisory given  Plan Discussed with: CRNA and  Anesthesiologist  Anesthesia Plan Comments:        Anesthesia Quick Evaluation

## 2021-05-05 NOTE — Transfer of Care (Signed)
Immediate Anesthesia Transfer of Care Note  Patient: Lisa Newton  Procedure(s) Performed: Anterior Cervical Decompression Fusion  - Cervical six-Cervical seven (N/A )  Patient Location: PACU  Anesthesia Type:General  Level of Consciousness: awake, alert  and oriented  Airway & Oxygen Therapy: Patient Spontanous Breathing and Patient connected to face mask oxygen  Post-op Assessment: Report given to RN and Post -op Vital signs reviewed and stable  Post vital signs: Reviewed and stable  Last Vitals:  Vitals Value Taken Time  BP 161/92   Temp    Pulse 81   Resp 12   SpO2 99%     Last Pain:  Vitals:   05/05/21 0831  TempSrc:   PainSc: 10-Worst pain ever      Patients Stated Pain Goal: 3 (05/05/21 0831)  Complications: No complications documented.

## 2021-05-05 NOTE — Anesthesia Postprocedure Evaluation (Signed)
Anesthesia Post Note  Patient: Rosibel Giacobbe Cousin  Procedure(s) Performed: Anterior Cervical Decompression Fusion  - Cervical six-Cervical seven (N/A )     Patient location during evaluation: PACU Anesthesia Type: General Level of consciousness: awake and alert Pain management: pain level controlled Vital Signs Assessment: post-procedure vital signs reviewed and stable Respiratory status: spontaneous breathing, nonlabored ventilation and respiratory function stable Cardiovascular status: blood pressure returned to baseline and stable Postop Assessment: no apparent nausea or vomiting Anesthetic complications: no   No complications documented.  Last Vitals:  Vitals:   05/05/21 1225 05/05/21 1307  BP: (!) 157/88 (!) 170/98  Pulse: 74 72  Resp: 14 20  Temp: 36.9 C 36.7 C  SpO2: 97% 100%    Last Pain:  Vitals:   05/05/21 1307  TempSrc: Oral  PainSc:                  Beryle Lathe

## 2021-05-05 NOTE — Op Note (Signed)
05/05/2021  2:14 PM  PATIENT:  Lisa Newton  50 y.o. 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 [name] was taken to the operating room, intubated, and placed under general anesthesia without difficulty. She was positioned supine with her head in slight extension on a horseshoe headrest. The neck was prepped and draped in a sterile manner. I infiltrated 6 cc's 1/2%lidocaine/1:200,000 strength epinephrine into the planned incision starting from the midline to the medial border of the left sternocleidomastoid muscle. I opened the incision with a 10 blade and dissected sharply through soft tissue to the platysma. I dissected in the plane superior to the platysma both rostrally and caudally. I then opened the platysma in a horizontal fashion with Metzenbaum scissors, and dissected in the inferior plane rostrally and caudally. With both blunt and sharp technique I created an avascular corridor to the cervical spine. I placed a spinal needle(s) in the disc space at 6/7,5/6, and 4/5 . I then reflected the longus colli from C6 to C7 and placed self retaining retractors. I opened the disc space(s) at 6/7 with a 15 blade. I removed disc with curettes, Kerrison punches, and the drill. Using the drill I removed osteophytes and prepared for the decompression.  I decompressed the spinal canal and the C7 root(s) with the drill, Kerrison punches, and the curettes. I used the microscope to aid  in microdissection. I removed the posterior longitudinal ligament to fully expose and decompress the thecal sac. I exposed the roots laterally taking down the 6/7 uncovertebral joints. With the decompression complete I moved on to the arthrodesis. I used the drill to level the surfaces of C6,and7. I removed soft tissue to prepare the disc space and the bony surfaces. I measured the space and placed an 63mm structural allograft into the disc space.  I then placed the anterior instrumentation. I placed 2 screws in each vertebral body through the plate. I locked the screws into place. Intraoperative xray showed the graft, plate, and screws to be in good position. I irrigated the wound, achieved hemostasis, and closed the wound in layers. I approximated the platysma, and the subcuticular plane with vicryl sutures. I used Dermabond for a sterile dressing.   PLAN OF CARE: Admit for overnight observation  PATIENT DISPOSITION:  PACU - hemodynamically stable.   Delay start of Pharmacological VTE agent (>24hrs) due to surgical blood loss or risk of bleeding:  no

## 2021-05-06 ENCOUNTER — Encounter (HOSPITAL_COMMUNITY): Payer: Self-pay | Admitting: Neurosurgery

## 2021-05-06 DIAGNOSIS — M50223 Other cervical disc displacement at C6-C7 level: Secondary | ICD-10-CM | POA: Diagnosis not present

## 2021-05-06 MED ORDER — TIZANIDINE HCL 4 MG PO TABS: 4.0000 mg | ORAL_TABLET | Freq: Four times a day (QID) | ORAL | 0 refills | Status: DC | PRN

## 2021-05-06 NOTE — Plan of Care (Signed)
Adequately Ready for Discharge 

## 2021-05-06 NOTE — Progress Notes (Signed)
Physical Therapy Evaluation and Discharge  Assessment: Patient evaluated by Physical Therapy with no further acute PT needs identified. All education has been completed and the patient has no further questions. Pt was able to demonstrate transfers and ambulation with gross modified independence to independence. Pt was educated on precautions, brace application/wearing schedule, appropriate activity progression, and car transfer. See below for any follow-up Physical Therapy or equipment needs. PT is signing off. Thank you for this referral.    05/06/21 0818  PT Visit Information  Last PT Received On 05/06/21  Assistance Needed +1  History of Present Illness Pt is a 50 y/o female who presents s/p C3-C4 ACDF on 05/05/2021. PMH significant for TMJ dysfunction, OA of spine, L ear hearing loss, hypothyroidism, HTN, prior posterior cervical fusion 2018, laminectomy/decompression 2019.  Precautions  Precautions Fall;Cervical  Precaution Booklet Issued Yes (comment)  Precaution Comments Reviewed handout in detail and pt was cued for precautions during functional mobility.  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  Communication  Communication No difficulties (L ear HOH but did not appear to have any trouble hearing therapist  during session.)  Pain Assessment  Pain Assessment Faces  Faces Pain Scale 2  Pain Location Incision site  Pain Descriptors / Indicators Operative site guarding  Pain Intervention(s) Limited activity within patient's tolerance;Monitored during session;Repositioned  Cognition  Arousal/Alertness Awake/alert  Behavior During Therapy WFL for tasks assessed/performed  Overall Cognitive Status Within Functional Limits for tasks assessed  Upper Extremity Assessment  Upper Extremity Assessment Defer to OT evaluation  Lower Extremity Assessment  Lower Extremity Assessment Overall WFL for tasks assessed  Cervical / Trunk Assessment  Cervical / Trunk Assessment Other exceptions  Cervical / Trunk Exceptions s/p surgery  Bed Mobility  Overal bed mobility Modified Independent  General bed mobility comments HOB slightly elevated and no rails. No assist required.  Transfers  Overall transfer level Independent  Equipment used None  General transfer comment No assist required. Pt powered up to full stand without difficulty.  Ambulation/Gait  Ambulation/Gait assistance Modified independent (Device/Increase time)  Gait Distance (Feet) 300 Feet  Assistive device None  Gait Pattern/deviations Step-through pattern;Decreased stride length  General Gait Details Slow but generally steady without difficulty.  Gait velocity Decreased  Gait velocity interpretation <1.8 ft/sec, indicate of risk for recurrent falls  Stairs Yes  Stairs assistance Modified independent (Device/Increase time)  Stair Management One rail Left;Alternating pattern;Forwards  Number of Stairs 10  General stair comments VC's for general safety. Pt negotiating stairs well.  Balance  Overall balance assessment No apparent balance deficits (not formally assessed)  PT - End of Session  Equipment Utilized During Treatment Cervical collar  Activity Tolerance Patient tolerated treatment well  Patient left Other (comment);with call  bell/phone within reach (Sitting EOB awaiting OT)  Nurse Communication Mobility status  PT Assessment  PT Recommendation/Assessment Patent does not  need any further PT services  PT Visit Diagnosis Unsteadiness on feet (R26.81);Pain  Pain - part of body  (neck)  No Skilled PT All education completed;Patient is independent with all acitivity/mobility;Patient is modified independent with all activity/mobility  AM-PAC PT "6 Clicks" Mobility Outcome Measure (Version 2)  Help needed turning from your back to your side while in a flat bed without using bedrails? 4  Help needed moving from lying on your back to sitting on the side of a flat bed without using bedrails? 4  Help needed moving to and from a bed to a chair (including a wheelchair)? 4  Help needed standing up from a chair using your arms (e.g., wheelchair or bedside chair)? 4  Help needed to walk in hospital room? 4  Help needed climbing 3-5 steps with a railing?  4  6 Click Score 24  Consider Recommendation of Discharge To: Home with no services  PT Recommendation  Follow Up Recommendations No PT follow up;Supervision - Intermittent  PT equipment None recommended by PT  Acute Rehab PT Goals  Patient Stated Goal Home today, return to work part time  PT Goal Formulation All assessment and education complete, DC therapy  PT Time Calculation  PT Start Time (ACUTE ONLY) 0735  PT Stop Time (ACUTE ONLY) 0753  PT Time Calculation (min) (ACUTE ONLY) 18 min  PT General Charges  $$ ACUTE PT VISIT 1 Visit  PT Evaluation  $PT Eval Low Complexity 1 Low  Written Expression  Dominant Hand Right    Conni Slipper, PT, DPT Acute Rehabilitation Services Pager: [phone] Office: [phone]

## 2021-05-06 NOTE — Evaluation (Signed)
Occupational Therapy Evaluation Patient Details Name: Lisa Newton MRN: [id number] DOB: [birth year] Today's Date: 05/06/2021    History of Present Illness 50 y.o. female presenting with herniated nucleus pulposis s/p C6-7 ACDF by Dr. Franky Macho. PMHx significant for anxiety/depression, DDD, Hx failed spinal surgery 2019, HTN, and cervical/lumbar OA.   Clinical Impression   PTA patient was living alone in a private residence and was independent with ADLs/IADLs without AD. Patient currently presents at baseline level of function demonstrating observed ADLs/ADL transfers with Mod I. OT provided education on spinal precautions, home set-up to maximize safety and independence with self-care tasks, and acquisition/use of AE. Patient expressed verbal understanding. Patient does not require continued acute occupational therapy services with OT to sign off at this time.       Follow Up Recommendations  No OT follow up    Equipment Recommendations  None recommended by OT    Recommendations for Other Services       Precautions / Restrictions Precautions Precautions: Cervical Precaution Booklet Issued: Yes (comment) Precaution Comments: Written handout proivded. Good return demo during functioanl activities. Required Braces or Orthoses: Cervical Brace Cervical Brace: Soft collar;For comfort Restrictions Weight Bearing Restrictions: No      Mobility Bed Mobility Overal bed mobility: Modified Independent             General bed mobility comments: Return to supine with Mod I. Good return demo of log rolling technique.    Transfers Overall transfer level: Independent Equipment used: None             General transfer comment: No assist required. Pt powered up to full stand without difficulty.    Balance Overall balance assessment: No apparent balance deficits (not formally assessed)                                         ADL either performed or  assessed with clinical judgement   ADL Overall ADL's : Modified independent                                             Vision Baseline Vision/History: Wears glasses Wears Glasses: At all times Patient Visual Report: No change from baseline       Perception     Praxis      Pertinent Vitals/Pain Pain Assessment: 0-10 Pain Score: 7  Faces Pain Scale: Hurts a little bit Pain Location: Anterior cervical neck (incisional) Pain Descriptors / Indicators: Sore Pain Intervention(s): Limited activity within patient's tolerance;Monitored during session;Premedicated before session;Repositioned     Hand Dominance Right   Extremity/Trunk Assessment Upper Extremity Assessment Upper Extremity Assessment: Overall WFL for tasks assessed   Lower Extremity Assessment Lower Extremity Assessment: Defer to PT evaluation   Cervical / Trunk Assessment Cervical / Trunk Assessment: Other exceptions Cervical / Trunk Exceptions: s/p cervical surgery   Communication Communication Communication: 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)   Cognition Arousal/Alertness: Awake/alert Behavior During Therapy: WFL for tasks assessed/performed Overall Cognitive Status: Within Functional Limits for tasks assessed                                     General Comments       Exercises  Shoulder Instructions      Home Living Family/patient expects to be discharged to:: Private residence Living Arrangements: Alone Available Help at Discharge: Family;Available PRN/intermittently (Family members live nearby) Type of Home: House Home Access: Stairs to enter Secretary/administrator of Steps: 6-7 Entrance Stairs-Rails: Left Home Layout: One level     Bathroom Shower/Tub: Producer, television/film/video: Standard     Home Equipment: Information systems manager - built in          Prior Functioning/Environment Level of Independence: Independent        Comments: I with ADLs/IADLs; drives;  works part-time as a Sales promotion account executive Problem List:        OT Treatment/Interventions:      OT Goals(Current goals can be found in the care plan section) Acute Rehab OT Goals Patient Stated Goal: To return home today. OT Goal Formulation: With patient  OT Frequency:     Barriers to D/C:            Co-evaluation              AM-PAC OT "6 Clicks" Daily Activity     Outcome Measure Help from another person eating meals?: None Help from another person taking care of personal grooming?: None Help from another person toileting, which includes using toliet, bedpan, or urinal?: None Help from another person bathing (including washing, rinsing, drying)?: A Little Help from another person to put on and taking off regular upper body clothing?: None Help from another person to put on and taking off regular lower body clothing?: None 6 Click Score: 23   End of Session    Activity Tolerance: Patient tolerated treatment well Patient left: in bed;with call bell/phone within reach  OT Visit Diagnosis: Muscle weakness (generalized) (M62.81)                Time: 1975-8832 OT Time Calculation (min): 13 min Charges:  OT General Charges $OT Visit: 1 Visit OT Evaluation $OT Eval Low Complexity: 1 Low  Kingsten Enfield H. OTR/L Supplemental OT, Department of rehab services [phone]  Felisa Zechman R H. 05/06/2021, 8:33 AM

## 2021-05-06 NOTE — Progress Notes (Signed)
Patient alert and oriented, voiding adequately, skin clean, dry and intact without evidence of skin break down, or symptoms of complications - no redness or edema noted, only slight tenderness at site.  Patient states pain is manageable at time of discharge. Patient has an appointment with MD in 3 weeks 

## 2021-05-06 NOTE — Discharge Instructions (Signed)

## 2021-05-18 NOTE — Discharge Summary (Signed)
Physician Discharge Summary  Patient ID: Lisa Newton MRN: [id number] DOB/AGE: Feb 07, 1971 50 y.o.  Admit date: 05/05/2021 Discharge date: 05/18/2021  Admission Diagnoses:Herniated Nucleus Pulposus C6/7   Discharge Diagnoses: Herniated Nucleus Pulposus C6/7  Active Problems:   HNP (herniated nucleus pulposus), cervical   Discharged Condition: good  Hospital Course: Lisa Newton was admitted and taken to the operating room for an uncomplicated ACDF. Post op her speaking voice was strong. Motor exam was normal, 5/5 strength. Her wound at discharge was clean, flat, and without signs of infection. She has voided, tolerated a regular diet, and ambulated. She will be discharged home.   Treatments: surgery: Anterior Cervical decompression C6/7 Arthrodesis C6-7 with 32mm structural allograft Anterior instrumentation(Nuvasive ACP) C6-7   Discharge Exam: Blood pressure 135/70, pulse 72, temperature 98.6 F (37 C), temperature source Oral, resp. rate 18, height 5\' 6"  (1.676 m), weight 101.2 kg, SpO2 99 %. as above  Disposition: Discharge disposition: 01-Home or Self Care      Herniated Nucleus Pulposus  Allergies as of 05/06/2021      Reactions   Augmentin [amoxicillin-pot Clavulanate] Hives, Other (See Comments)   Took Bactrim and Augmentin together and broke out in hives.5/28/2022Marland KitchenPt says penicillin/amoxicillin previously without any problems.the patient is unable to answer any penicillin questions as it is unknown exactly what medication broke her out in hives.   Bactrim [sulfamethoxazole-trimethoprim] Hives, Other (See Comments)   Took Bactrim and Augmentin together and broke out in hives.Marland KitchenMarland KitchenPt says she took bactrim previously without any problems, she did state that she had just been released after a course of vancomycin iv and didn't know if that hadn't affected the bactrim.    Latex Hives   Other    Gadolinium- headaches   Pollen Extract    UNSPECIFIED REACTION    Tape  Rash      Medication List    TAKE these medications   atorvastatin 40 MG tablet Commonly known as: LIPITOR Take 1 tablet (40 mg total) by mouth daily.   cyclobenzaprine 10 MG tablet Commonly known as: FLEXERIL Take 10 mg by mouth 3 (three) times daily as needed for muscle spasms.   DULoxetine 60 MG capsule Commonly known as: Cymbalta Take 1 capsule (60 mg total) by mouth daily.   HAIR/SKIN/NAILS/BIOTIN PO Take 2 each by mouth daily.   levothyroxine 75 MCG tablet Commonly known as: SYNTHROID Take 1 tablet (75 mcg total) by mouth daily before breakfast.   losartan-hydrochlorothiazide 50-12.5 MG tablet Commonly known as: Hyzaar Take 1 tablet by mouth daily.   metoprolol succinate 50 MG 24 hr tablet Commonly known as: TOPROL-XL Take 50 mg by mouth daily. Take with or immediately following a meal.   oxyCODONE-acetaminophen 10-325 MG tablet Commonly known as: PERCOCET Take 1 tablet by mouth 3 (three) times daily as needed.   tiZANidine 4 MG tablet Commonly known as: ZANAFLEX Take 1 tablet (4 mg total) by mouth every 6 (six) hours as needed for muscle spasms.   vitamin B-12 1000 MCG tablet Commonly known as: CYANOCOBALAMIN Take 1,000 mcg by mouth daily.       Follow-up Information    [name], MD Follow up.   Specialty: Neurosurgery Why: keep your scheduled appointment Contact information: [address] [place] [phone]               Signed: [phone] 05/18/2021, 7:59 PM

## 2021-05-26 ENCOUNTER — Ambulatory Visit (HOSPITAL_COMMUNITY): Payer: Self-pay | Admitting: Licensed Clinical Social Worker

## 2021-09-14 ENCOUNTER — Other Ambulatory Visit: Payer: Self-pay | Admitting: Family Medicine

## 2021-09-14 DIAGNOSIS — Z1231 Encounter for screening mammogram for malignant neoplasm of breast: Secondary | ICD-10-CM

## 2021-09-30 ENCOUNTER — Other Ambulatory Visit: Payer: Self-pay

## 2021-09-30 ENCOUNTER — Other Ambulatory Visit: Payer: Self-pay | Admitting: Family Medicine

## 2021-09-30 ENCOUNTER — Telehealth: Payer: Self-pay | Admitting: Family Medicine

## 2021-09-30 DIAGNOSIS — E038 Other specified hypothyroidism: Secondary | ICD-10-CM

## 2021-09-30 MED ORDER — ATORVASTATIN CALCIUM 40 MG PO TABS: 40.0000 mg | ORAL_TABLET | Freq: Every day | ORAL | 3 refills | Status: DC

## 2021-09-30 MED ORDER — LOSARTAN POTASSIUM-HCTZ 50-12.5 MG PO TABS: 1.0000 | ORAL_TABLET | Freq: Every day | ORAL | 1 refills | Status: DC

## 2021-09-30 NOTE — Telephone Encounter (Signed)
Patient calls nurse line requesting refills on medications. Patient reports she has not been taking Metoprolol for ~1 month. However, patient reports she has been taking Losartan. Patient reports she has been checking her BP at other offices and "they have been good." Patient advised to make an apt with PCP to discuss. Please advise on refills.

## 2021-09-30 NOTE — Telephone Encounter (Signed)
I called to discuss with the patient.  She self d/c Metoprolol and would prefer not to get on it today.  She need TSH recheck prior to refilling her Synthroid. She agreed to call for lab appointment as she is unable to come in to see me anytime soon.   Lab order placed.  I will refill her Synthroid once I have her TSH result.  She agreed with the plan.

## 2021-10-01 ENCOUNTER — Ambulatory Visit: Payer: Medicare HMO | Admitting: Family Medicine

## 2021-10-09 ENCOUNTER — Other Ambulatory Visit: Payer: Self-pay | Admitting: Family Medicine

## 2021-10-10 ENCOUNTER — Other Ambulatory Visit: Payer: Self-pay | Admitting: Family Medicine

## 2021-10-11 ENCOUNTER — Other Ambulatory Visit: Payer: Self-pay

## 2021-10-11 ENCOUNTER — Other Ambulatory Visit: Payer: Medicare HMO

## 2021-10-11 DIAGNOSIS — E038 Other specified hypothyroidism: Secondary | ICD-10-CM

## 2021-10-11 NOTE — Telephone Encounter (Signed)
Please contact patient and advise her that she need lab work for Synthroid refill. Please, help her schedule lab appointment or follow-up with me. I will give a 15 days supplu of her synthroid.

## 2021-10-13 LAB — TSH: TSH: 2.64 u[IU]/mL (ref 0.450–4.500)

## 2021-10-22 ENCOUNTER — Ambulatory Visit: Payer: Medicare HMO | Admitting: Family Medicine

## 2021-10-26 ENCOUNTER — Encounter: Payer: Self-pay | Admitting: Family Medicine

## 2021-10-26 ENCOUNTER — Ambulatory Visit (INDEPENDENT_AMBULATORY_CARE_PROVIDER_SITE_OTHER): Payer: Medicare HMO | Admitting: Family Medicine

## 2021-10-26 ENCOUNTER — Encounter: Payer: Self-pay | Admitting: *Deleted

## 2021-10-26 ENCOUNTER — Ambulatory Visit: Payer: Medicare Other

## 2021-10-26 ENCOUNTER — Other Ambulatory Visit: Payer: Self-pay

## 2021-10-26 VITALS — BP 177/98 | HR 78 | Ht 66.0 in | Wt 221.4 lb

## 2021-10-26 DIAGNOSIS — F331 Major depressive disorder, recurrent, moderate: Secondary | ICD-10-CM

## 2021-10-26 DIAGNOSIS — M5412 Radiculopathy, cervical region: Secondary | ICD-10-CM | POA: Diagnosis not present

## 2021-10-26 DIAGNOSIS — M545 Low back pain, unspecified: Secondary | ICD-10-CM

## 2021-10-26 DIAGNOSIS — I10 Essential (primary) hypertension: Secondary | ICD-10-CM

## 2021-10-26 DIAGNOSIS — E785 Hyperlipidemia, unspecified: Secondary | ICD-10-CM

## 2021-10-26 DIAGNOSIS — Z114 Encounter for screening for human immunodeficiency virus [HIV]: Secondary | ICD-10-CM | POA: Diagnosis not present

## 2021-10-26 DIAGNOSIS — M47817 Spondylosis without myelopathy or radiculopathy, lumbosacral region: Secondary | ICD-10-CM

## 2021-10-26 DIAGNOSIS — Z113 Encounter for screening for infections with a predominantly sexual mode of transmission: Secondary | ICD-10-CM

## 2021-10-26 DIAGNOSIS — M47812 Spondylosis without myelopathy or radiculopathy, cervical region: Secondary | ICD-10-CM

## 2021-10-26 MED ORDER — IBUPROFEN 400 MG PO TABS: 400.0000 mg | ORAL_TABLET | Freq: Two times a day (BID) | ORAL | 0 refills | Status: AC | PRN

## 2021-10-26 MED ORDER — LEVOTHYROXINE SODIUM 75 MCG PO TABS: 75.0000 ug | ORAL_TABLET | Freq: Every day | ORAL | 1 refills | Status: DC

## 2021-10-26 NOTE — Assessment & Plan Note (Signed)
Acute flare, which she attributed to her nature of work. There was no neurologic deficit on the exam. All previous X-rays and CT scan was reviewed. She is yet to follow up with her spine specialist since her neck surgery in May. I advised her to contact them soon for f/u. PT referral was discussed, and a referral was placed. Add Ibuprofen 400 mg PRN for breakthrough pain. Continue Percocet as prescribed by her pain specialist. Return precautions discussed. She agreed with the plan.

## 2021-10-26 NOTE — Assessment & Plan Note (Signed)
FLP checked. I will contact her soon with her result.

## 2021-10-26 NOTE — Progress Notes (Signed)
SUBJECTIVE:   CHIEF COMPLAINT / HPI:   Depression        This is a recurrent problem.  The onset quality is gradual.   The problem occurs intermittently (Her back pain is aggavating her depression).  The problem has been gradually worsening since onset.  Associated symptoms include no helplessness, no headaches and no suicidal ideas.     The symptoms are aggravated by work stress.  Past treatments include nothing (She self d/ced her Cymbalta).  Compliance with treatment is variable. Back Pain This is a chronic (back pain from neck down started 6 days ago. She had neck surgery May 25th) problem. The problem occurs constantly. The problem has been gradually worsening since onset. Pain location: Pain from her neck to her lower back. The pain is at a severity of 10/10. The pain is severe. Exacerbated by: Prolonged sitting makes it worse and certain movement. She feels her job is making it worse. She works as a Engineer, materials. Pertinent negatives include no abdominal pain, headaches, numbness, paresis or weakness. (Occasional tingling of her arm. She threw up from taking her pain meds. Denies abdominal pain, no fever. + Constipation) Treatments tried: She uses her Percocet with minimal improvement as well as her muscle relaxant. She also received steroid IM injection yesterday from her spine specialist which help only minimally. The treatment provided moderate relief.   HTN/HLD: Threw up her BP meds this morning. No other concerns. She claimed compliance with her Lipitor 40 mg.  Health Maintenance: Due for colon cancer screening, flu shot and COVID 19 shot.  PERTINENT  PMH / PSH: PMHx reviewed  OBJECTIVE:   BP (!) 177/98   Pulse 78   Ht 5\' 6"  (1.676 m)   Wt 221 lb 6.4 oz (100.4 kg)   LMP 10/18/2021   SpO2 100%   BMI 35.73 kg/m   Physical Exam Vitals reviewed.  Constitutional:      Appearance: She is not ill-appearing.  Cardiovascular:     Rate and Rhythm: Normal rate and regular  rhythm.     Heart sounds: Normal heart sounds.  Pulmonary:     Effort: Pulmonary effort is normal. No respiratory distress.     Breath sounds: Normal breath sounds. No wheezing.  Abdominal:     General: Abdomen is flat. Bowel sounds are normal. There is no distension.     Palpations: There is no mass.     Tenderness: There is no abdominal tenderness.  Musculoskeletal:     Cervical back: No deformity or tenderness. Normal range of motion.     Lumbar back: Tenderness present. Positive right straight leg raise test. Negative left straight leg raise test.     Right lower leg: No edema.     Left lower leg: No edema.     Comments: Lumbar spine decreased ROM due to pain.   Neurological:     General: No focal deficit present.     Mental Status: She is alert and oriented to person, place, and time.     Cranial Nerves: Cranial nerves 2-12 are intact.     Sensory: Sensation is intact.     Motor: Motor function is intact.     Coordination: Coordination is intact.     Deep Tendon Reflexes: Reflexes are normal and symmetric.  Psychiatric:        Attention and Perception: Attention normal.        Mood and Affect: Mood is depressed.  Speech: Speech normal.        Behavior: Behavior normal.        Thought Content: Thought content does not include homicidal or suicidal ideation.        Cognition and Memory: Cognition normal.     Comments: She cried throughout the visit due to her back pain   Flowsheet Row Office Visit from 10/26/2021 in [facility]  PHQ-9 Total Score 16         ASSESSMENT/PLAN:   MDD (major depressive disorder) Triggered by her pain. She had been off Cymbalta for months and had done well without it. She denies SI/HI. I discussed resuming her Cymbalta vs therapy. She declined both for now. She feels she will get better if her pain improves.   Spondylosis without myelopathy or radiculopathy, cervical region Acute flare, which she attributed  to her nature of work. There was no neurologic deficit on the exam. All previous X-rays and CT scan was reviewed. She is yet to follow up with her spine specialist since her neck surgery in May. I advised her to contact them soon for f/u. PT referral was discussed, and a referral was placed. Add Ibuprofen 400 mg PRN for breakthrough pain. Continue Percocet as prescribed by her pain specialist. Return precautions discussed. She agreed with the plan.  Spondylosis without myelopathy or radiculopathy, lumbosacral region Chronic recurrent. Aggravated by her job. No neurologic deficit on her exam. Imaging studies reviewed. I referred her to PT and started Ibuprofen 400 mg PRN for breakthrough pain. She will contact her spine specialist soon for f/u. She wishes to proceed with disability application. As discussed, I will complete the paper work for her soon as I receive them.   Essential hypertension BP elevated today. She was crying and in pain. No medication adjustment made today. I advised her to return for reassessment in 2 weeks. Continue current regimen. She agreed with the plan.   Hyperlipidemia FLP checked. I will contact her soon with her result.    She requested HIV and RPR screening since she is getting blood drawn. She denies exposure risk.  More than 50% of this 45 minutes appointment was spent on record review, counseling and coordination of care.  Janit Pagan, MD [facility]

## 2021-10-26 NOTE — Patient Instructions (Signed)
Back Injury Prevention Back injuries can be very painful. They can also be difficult to heal. After having one back injury, you are more likely to have another one. It is important to learn how to avoid injuring or re-injuring your back. The following tips can help you prevent a back injury. What actions can I take to prevent back injuries? Changes in your diet Talk with your doctor about what to eat. Some foods can help make the bones strong. Talk with your doctor about how much calcium and vitamin D you need each day. These nutrients help to prevent weakening of the bones (osteoporosis). Eat foods that have calcium. These include: Dairy products. Green leafy vegetables. Food and drinks that have calcium added to them (are fortified). Eat foods that have vitamin D. These include: Milk. Food and drinks that have vitamin D added to them. If needed, take supplements and vitamins as told by your doctor. Physical fitness Physical fitness makes your bones and muscles strong. It also improves your balance and strength. Exercise for 30 minutes a day on most days of the week, or as told by your doctor. Make sure to: Do aerobic exercises, such as walking, jogging, biking, or swimming. Do exercises that increase balance and strength, such as tai chi and yoga. Do stretching exercises. Develop strong belly (abdominal) muscles. Your belly muscles help to support your back. Stay at a healthy weight. This lowers your risk of a back injury. Good posture     Prevent back injuries by developing and keeping a good posture. To do this: Sit up straight and stand up straight. Avoid leaning forward when you sit or hunching over when you stand. Choose chairs that have good low-back (lumbar) support. If you work at a desk: Sit close to it so you do not need to lean over. Keep your chin tucked in. Keep your neck drawn back. Keep your elbows bent so that your arms make a corner (right angle). When you  drive: Sit high and close to the steering wheel. Add low-back support to your car seat, if needed. Take breaks every hour if you are driving for long periods of time. Avoid sitting or standing in one position for very long. Take breaks to get up, stretch, and walk around at least once every hour. Sleep on your side with your knees slightly bent, or sleep on your back with a pillow under your knees. Keep your head and neck in a straight line with your spine (neutral position) when using electronics like smartphones or tablets. To do this: Raise your smartphone or tablet to look at it instead of bending your head or neck to look down. Put the smartphone or tablet at the level of your face while looking at the screen.  Lifting, twisting, and reaching  Heavy lifting Avoid heavy lifting, especially lifting over and over again. If you must do heavy lifting: Stretch before lifting. Work slowly. Rest between lifts. Use a tool such as a cart or a dolly to move objects. Make a few small trips instead of carrying one heavy load. Ask for help when you need it, especially when moving big objects. Follow these steps when lifting: Stand with your feet shoulder-width apart. Get as close to the object as you can. Do not pick up a heavy object that is far from your body. Use handles or lifting straps if you have them. Bend at your knees. Squat down, but keep your heels off the floor. Keep your shoulders back. Keep  your chin tucked in. Keep your back straight. Lift the object slowly while you tighten the muscles in your legs, belly, and butt. Keep the object as close to the center of your body as you can. Follow these steps when putting down a heavy load: Stand with your feet shoulder-width apart. Lower the object slowly while you tighten the muscles in your legs, belly, and butt. Keep the object as close to the center of your body as you can. Keep your shoulders back. Keep your chin tucked in. Keep your  back straight. Bend at your knees. Squat down, but keep your heels off the floor. Use handles or lifting straps if you have them. Twisting and reaching Avoid lifting heavy objects above your waist. Do not twist at your waist while you are lifting or carrying a load. If you need to turn, move your feet. Do not bend over without bending at your knees. Avoid reaching over your head, across a table, or for an object on a high surface. Other things to do  Avoid wet floors and icy ground. Keep sidewalks clear of ice to prevent falls. Do not sleep on a mattress that is too soft or too hard. Put heavier objects on shelves at waist level. Put lighter objects on lower or higher shelves. Find ways to lower your stress, such as: Exercise. Massage. Using a technique to help you relax. Talk with your doctor if you feel worried or sad (depressed). These conditions can make back pain worse. Wear flat heeled shoes with cushioned soles. Use both shoulder straps when carrying a backpack. Do not smoke or use any products that contain nicotine or tobacco. If you need help quitting, ask your doctor. Summary Back injuries can be very painful and difficult to heal. You can keep your back healthy by making certain changes. These include eating foods that make bones strong, working on being physically fit, developing a good posture, and lifting heavy objects in a safe way. Talk with your doctor about how much calcium and vitamin D you need each day. These nutrients help to prevent weakening of the bones (osteoporosis). This information is not intended to replace advice given to you by your health care provider. Make sure you discuss any questions you have with your health care provider. Document Revised: 03/22/2021 Document Reviewed: 03/22/2021 Elsevier Patient Education  [employer].

## 2021-10-26 NOTE — Progress Notes (Signed)
Patient has already viewed results via mychart.  I sent her a message to confirm results and next steps.  Olney Monier,CMA

## 2021-10-26 NOTE — Assessment & Plan Note (Signed)
Triggered by her pain. She had been off Cymbalta for months and had done well without it. She denies SI/HI. I discussed resuming her Cymbalta vs therapy. She declined both for now. She feels she will get better if her pain improves.

## 2021-10-26 NOTE — Assessment & Plan Note (Signed)
Chronic recurrent. Aggravated by her job. No neurologic deficit on her exam. Imaging studies reviewed. I referred her to PT and started Ibuprofen 400 mg PRN for breakthrough pain. She will contact her spine specialist soon for f/u. She wishes to proceed with disability application. As discussed, I will complete the paper work for her soon as I receive them.

## 2021-10-26 NOTE — Assessment & Plan Note (Signed)
BP elevated today. She was crying and in pain. No medication adjustment made today. I advised her to return for reassessment in 2 weeks. Continue current regimen. She agreed with the plan.

## 2021-10-27 ENCOUNTER — Telehealth: Payer: Self-pay | Admitting: Family Medicine

## 2021-10-27 LAB — RPR: RPR Ser Ql: NONREACTIVE

## 2021-10-27 LAB — LIPID PANEL
Chol/HDL Ratio: 3.7 ratio (ref 0.0–4.4)
Cholesterol, Total: 128 mg/dL (ref 100–199)
HDL: 35 mg/dL — ABNORMAL LOW (ref >39)
LDL Chol Calc (NIH): 70 mg/dL (ref 0–99)
Triglycerides: 131 mg/dL (ref 0–149)
VLDL Cholesterol Cal: 23 mg/dL (ref 5–40)

## 2021-10-27 LAB — HIV ANTIBODY (ROUTINE TESTING W REFLEX): HIV Screen 4th Generation wRfx: NONREACTIVE

## 2021-10-27 NOTE — Telephone Encounter (Signed)
Test result discussed. Neg HIV and RPR. FLP improved.  Still having back pain. I reminded her to pick up her Ibuprofen from the pharmacy and contact her spine specialist today for an appointment. She agreed with the plan.

## 2021-11-01 ENCOUNTER — Emergency Department (HOSPITAL_COMMUNITY)
Admission: EM | Admit: 2021-11-01 | Discharge: 2021-11-02 | Disposition: A | Discharge status: Home / Self Care | Payer: Medicare HMO | Attending: Emergency Medicine | Admitting: Emergency Medicine

## 2021-11-01 ENCOUNTER — Ambulatory Visit (HOSPITAL_COMMUNITY)
Admission: EM | Admit: 2021-11-01 | Discharge: 2021-11-01 | Disposition: A | Discharge status: Other Acute Inpatient Hospital | Payer: Medicare HMO | Attending: Urgent Care | Admitting: Urgent Care

## 2021-11-01 ENCOUNTER — Emergency Department (HOSPITAL_COMMUNITY): Payer: Medicare HMO

## 2021-11-01 ENCOUNTER — Encounter (HOSPITAL_COMMUNITY): Payer: Self-pay | Admitting: *Deleted

## 2021-11-01 ENCOUNTER — Other Ambulatory Visit: Payer: Self-pay

## 2021-11-01 ENCOUNTER — Ambulatory Visit (INDEPENDENT_AMBULATORY_CARE_PROVIDER_SITE_OTHER): Payer: Medicare HMO

## 2021-11-01 DIAGNOSIS — G8929 Other chronic pain: Secondary | ICD-10-CM | POA: Insufficient documentation

## 2021-11-01 DIAGNOSIS — I10 Essential (primary) hypertension: Secondary | ICD-10-CM | POA: Diagnosis not present

## 2021-11-01 DIAGNOSIS — M4802 Spinal stenosis, cervical region: Secondary | ICD-10-CM | POA: Diagnosis not present

## 2021-11-01 DIAGNOSIS — Z79899 Other long term (current) drug therapy: Secondary | ICD-10-CM | POA: Diagnosis not present

## 2021-11-01 DIAGNOSIS — Z9889 Other specified postprocedural states: Secondary | ICD-10-CM | POA: Diagnosis not present

## 2021-11-01 DIAGNOSIS — M4322 Fusion of spine, cervical region: Secondary | ICD-10-CM | POA: Insufficient documentation

## 2021-11-01 DIAGNOSIS — E039 Hypothyroidism, unspecified: Secondary | ICD-10-CM | POA: Diagnosis not present

## 2021-11-01 DIAGNOSIS — M542 Cervicalgia: Secondary | ICD-10-CM

## 2021-11-01 DIAGNOSIS — M5010 Cervical disc disorder with radiculopathy, unspecified cervical region: Secondary | ICD-10-CM

## 2021-11-01 DIAGNOSIS — Z981 Arthrodesis status: Secondary | ICD-10-CM

## 2021-11-01 DIAGNOSIS — Z91040 Latex allergy status: Secondary | ICD-10-CM | POA: Insufficient documentation

## 2021-11-01 MED ORDER — LORAZEPAM 2 MG/ML IJ SOLN: 1.0000 mg | Freq: Once | INTRAMUSCULAR | Status: DC

## 2021-11-01 MED ORDER — LORAZEPAM 2 MG/ML IJ SOLN: 0.5000 mg | Freq: Once | INTRAMUSCULAR | Status: DC

## 2021-11-01 MED ORDER — OXYCODONE-ACETAMINOPHEN 5-325 MG PO TABS
1.0000 | ORAL_TABLET | Freq: Once | ORAL | Status: AC
  Administered 2021-11-01: 1 via ORAL
  Filled 2021-11-01: qty 1

## 2021-11-01 MED ORDER — LORAZEPAM 1 MG PO TABS: 0.5000 mg | ORAL_TABLET | Freq: Once | ORAL | Status: DC

## 2021-11-01 MED ORDER — LORAZEPAM 1 MG PO TABS
1.0000 mg | ORAL_TABLET | ORAL | Status: AC | PRN
  Administered 2021-11-01: 1 mg via ORAL
  Filled 2021-11-01: qty 1

## 2021-11-01 NOTE — Discharge Instructions (Addendum)
Your MRI imaging did not reveal any acute abnormalities.  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 Recommend you follow-up with your pain specialist and your neurosurgeon regarding your symptoms.   MRI Results: IMPRESSION: 1. Evaluation is somewhat limited by motion artifact. Within this limitation, status post interval ACDF at C6-C7, with resolution of previously noted left paracentral disc protrusion. No abnormal spinal cord signal. No residual spinal canal stenosis at this level. There is mild left neural foraminal narrowing, which appears new from prior exam. 2. C5-C6 mild spinal canal stenosis and moderate bilateral neural foraminal narrowing, unchanged. 3. C3-C4 and C4-C5 mild spinal canal stenosis and mild bilateral neural foraminal narrowing, unchanged.

## 2021-11-01 NOTE — Discharge Instructions (Signed)
Unfortunately you have failed outpatient management and given the severity of your neck pain, neurologic symptoms and recommendations from the radiologist I recommend that you have present to the emergency room for an emergent MRI.  Please make sure that you contact your neurosurgeon for follow-up appointment as soon as possible.

## 2021-11-01 NOTE — ED Triage Notes (Signed)
Pt reports current pain across top of shoulders started over a week ago. Pt having had cervical surgery May 2022. Pt reports to be taking multiple meds for pain with out relief.
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
## 2021-11-01 NOTE — ED Provider Notes (Addendum)
[facility]   MRN: [id number] DOB: 1971/11/22  Subjective:   Lisa Newton is a 50 y.o. female presenting for 1 week history of persistent left-sided neck pain that radiates to the left shoulder.  Patient has a significant history of cervical radiculitis, spondylosis.  She had cervical decompression discectomy fusion May 2022.  She has since had persistent recurrent symptoms.  Has not followed up with her surgeon recently.  No new falls, trauma.  She is taking her regular chronic pain medications. She saw her pain management provider 10/25/2021, received a steroid injection but did not help at all.   No current facility-administered medications for this encounter.  Current Outpatient Medications:    atorvastatin (LIPITOR) 40 MG tablet, Take 1 tablet (40 mg total) by mouth daily., Disp: 90 tablet, Rfl: 3   cyclobenzaprine (FLEXERIL) 10 MG tablet, Take 10 mg by mouth 3 (three) times daily as needed for muscle spasms. (Patient not taking: Reported on 10/26/2021), Disp: , Rfl:    DULoxetine (CYMBALTA) 60 MG capsule, TAKE 1 CAPSULE BY MOUTH EVERY DAY (Patient not taking: Reported on 10/26/2021), Disp: 90 capsule, Rfl: 1   ibuprofen (ADVIL) 400 MG tablet, Take 1 tablet (400 mg total) by mouth every 12 (twelve) hours as needed for up to 7 days for moderate pain., Disp: 14 tablet, Rfl: 0   levocetirizine (XYZAL) 5 MG tablet, TAKE 1 TABLET BY MOUTH EVERY DAY IN THE EVENING, Disp: 90 tablet, Rfl: 1   levothyroxine (SYNTHROID) 75 MCG tablet, Take 1 tablet (75 mcg total) by mouth daily before breakfast., Disp: 90 tablet, Rfl: 1   losartan-hydrochlorothiazide (HYZAAR) 50-12.5 MG tablet, Take 1 tablet by mouth daily., Disp: 90 tablet, Rfl: 1   metoprolol succinate (TOPROL-XL) 50 MG 24 hr tablet, Take 50 mg by mouth daily. Take with or immediately following a meal., Disp: , Rfl:    Multiple Vitamins-Minerals (HAIR/SKIN/NAILS/BIOTIN PO), Take 2 each by mouth daily., Disp: , Rfl:     oxyCODONE-acetaminophen (PERCOCET) 10-325 MG tablet, Take 1 tablet by mouth 3 (three) times daily as needed., Disp: , Rfl:    tiZANidine (ZANAFLEX) 4 MG tablet, Take 1 tablet (4 mg total) by mouth every 6 (six) hours as needed for muscle spasms., Disp: 60 tablet, Rfl: 0   vitamin B-12 (CYANOCOBALAMIN) 1000 MCG tablet, Take 1,000 mcg by mouth daily. (Patient not taking: Reported on 10/26/2021), Disp: , Rfl:    Allergies  Allergen Reactions   Augmentin [Amoxicillin-Pot Clavulanate] Hives and Other (See Comments)    Took Bactrim and Augmentin together and broke out in hives.Marland KitchenMarland KitchenPt says penicillin/amoxicillin previously without any problems.the patient is unable to answer any penicillin questions as it is unknown exactly what medication broke her out in hives.   Bactrim [Sulfamethoxazole-Trimethoprim] Hives and Other (See Comments)    Took Bactrim and Augmentin together and broke out in hives.Marland KitchenMarland KitchenPt says she took bactrim previously without any problems, she did state that she had just been released after a course of vancomycin iv and didn't know if that hadn't affected the bactrim.    Latex Hives   Other     Gadolinium- headaches   Pollen Extract     UNSPECIFIED REACTION    Tape Rash    Past Medical History:  Diagnosis Date   Abnormal MRI, cervical spine (07/25/2018) 09/03/2018   Disc levels: C2-3: The disc appears normal. Stable mild bilateral facet hypertrophy. No spinal stenosis or nerve root encroachment. C3-4: Stable mild uncinate spurring and facet hypertrophy. No spinal  stenosis or nerve root encroachment. C4-5: Stable chronic spondylosis with posterior osteophytes covering diffusely bulging disc material. The AP diameter of the canal is chronically narrowed to 8 mm   Abnormal MRI, lumbar spine (07/25/2018) 09/03/2018   Disc levels: L4-5: The patient is status post right laminotomy for discectomy. There is some loss of disc space height. Fat about the descending right L4 root is obliterated in the  subarticular recess. This is likely due to the presence of granulation tissue. No definite mass effect on the root is seen and there is no mass effect on the thecal sac. Foramina are open. 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   Herniation of cervical intervertebral disc with radiculopathy 03/14/2017   Hypertension    Hypothyroidism    Issue of medical certificate for disability examination 10/25/2013   Left ear hearing loss    Lumbar radiculitis (Bilateral) 08/06/2018   Neck pain    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   Parotitis, acute 06/11/2018   PONV (postoperative nausea and vomiting)    Poor social situation 09/13/2017   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/2018   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     Past Surgical History:  Procedure Laterality Date   ANTERIOR CERVICAL DECOMP/DISCECTOMY FUSION N/A 05/05/2021   Procedure: Anterior Cervical Decompression Fusion  - Cervical six-Cervical seven;  Surgeon: Coletta Memos, MD;  Location: [facility] OR;  Service: Neurosurgery;  Laterality: N/A;   cholesterol granuloma     left ear   left ear surgery     ruptured TM   LUMBAR LAMINECTOMY/DECOMPRESSION MICRODISCECTOMY Right 12/14/2017   Procedure: MICRODISCECTOMY LUMBAR FOUR- LUMBAR FIVE - RIGHT;  Surgeon: Coletta Memos, MD;  Location: [facility] OR;  Service: Neurosurgery;  Laterality: Right;  MICRODISCECTOMY LUMBAR 4- LUMBAR 5 - RIGHT   POSTERIOR CERVICAL FUSION/FORAMINOTOMY N/A 03/14/2017   Procedure: LEFT C6-7 FORAMINOTOMY WITH EXCISION OF HERNIATED NUCLEUS PULPOSUS;   Surgeon: Kerrin Champagne, MD;  Location: [facility] OR;  Service: Orthopedics;  Laterality: N/A;    Family History  Problem Relation Age of Onset   Lung cancer Mother 77   Hypertension Father    Eczema Daughter    Asthma Neg Hx    Urticaria Neg Hx    Allergic rhinitis Neg Hx     Social History   Tobacco Use   Smoking status: Never   Smokeless tobacco: Never  Vaping Use   Vaping Use: Never used  Substance Use Topics   Alcohol use: Not Currently    Alcohol/week: 0.0 standard drinks   Drug use: Not Currently    Types: Marijuana    Comment: 1 time in the past 6 months    ROS   Objective:   Vitals: BP (!) 152/92   Pulse 76   Temp 98.7 F (37.1 C)   Resp 20   LMP 10/18/2021   SpO2 (!) 10%   Pulse oximetry rechecked by PA Allura Doepke and was 100%.   Physical Exam Constitutional:      General: She is not in acute distress.    Appearance: Normal appearance. She  is well-developed. She is not ill-appearing, toxic-appearing or diaphoretic.  HENT:     Head: Normocephalic and atraumatic.     Nose: Nose normal.     Mouth/Throat:     Mouth: Mucous membranes are moist.     Pharynx: Oropharynx is clear.  Eyes:     General: No scleral icterus.    Extraocular Movements: Extraocular movements intact.     Pupils: Pupils are equal, round, and reactive to light.  Cardiovascular:     Rate and Rhythm: Normal rate.  Pulmonary:     Effort: Pulmonary effort is normal.  Musculoskeletal:     Comments: Significant tenderness about the neck over the paraspinal muscles and midline.  Patient is guarding extensively, unable to evaluate for Spurling or Lhermitte sign.  She does have weakness of the left arm compared to the right.  However, she does have full range of motion for upper extremities.  Skin:    General: Skin is warm and dry.  Neurological:     General: No focal deficit present.     Mental Status: She is alert and oriented to person, place, and time.  Psychiatric:        Mood and Affect:  Mood normal.        Behavior: Behavior normal.    DG Cervical Spine Complete  Result Date: 11/01/2021 CLINICAL DATA:  Neck pain EXAM: CERVICAL SPINE - COMPLETE 4+ VIEW COMPARISON:  05/05/2021 FINDINGS: No fracture or static subluxation of the cervical spine. Status post anterior cervical discectomy and fusion of C6-C7 with moderate to severe disc space height loss and osteophytosis of C4 through C6. There is likely some degree of bony neural foraminal stenosis at C4-C5 and C5-C6 bilaterally. Partially imaged skull base, cervical soft tissues, and upper chest are unremarkable. IMPRESSION: 1.  No fracture or static subluxation of the cervical spine. 2. Status post anterior cervical discectomy and fusion of C6-C7 with moderate to severe disc space height loss and osteophytosis of C4 through C6. There is likely some degree of bony neural foraminal stenosis at C4-C5 and C5-C6 bilaterally. Cervical disc and neural foraminal pathology may be further evaluated by MRI if indicated by neurologically localizing signs and symptoms. Electronically Signed   By: Jearld Lesch M.D.   On: 11/01/2021 14:12    Assessment and Plan :   PDMP not reviewed this encounter.  1. Chronic neck pain   2. Cervical disc disorder with radiculopathy of cervical region   3. History of excision of lamina of cervical vertebra for decompression of spinal cord   4. History of back surgery     Patient has acute on chronic pain.  She is not responding to outpatient management.  As she already been given steroids in the past week together with the radiology overread, recommended further evaluation in the emergency room including consideration for the MRI. Counseled patient on potential for adverse effects with medications prescribed/recommended today, ER and return-to-clinic precautions discussed, patient verbalized understanding.    Wallis Bamberg, [place] 11/01/21 1448

## 2021-11-01 NOTE — ED Provider Notes (Signed)
Emergency Medicine Provider Triage Evaluation Note  Lisa Newton , a 50 y.o. female  was evaluated in triage.  Pt complains of neck pain.  She is status post 2 prior cervical laminectomies, the most recent was in May 2020.  About a week ago her pain in the neck became severe, was seen by the neurosurgery office and given a steroid injection.  This has not helped with any relief, she is taking Percocet 3 times daily as well as a muscle relaxer without any relief.  Went to urgent care, sent to ED for MRI.  Review of Systems  Positive: Neck pain Negative: Headache, nausea, vomiting, blurry  Physical Exam  BP (!) 182/106 (BP Location: Right Arm)   Pulse 85   Temp 98.4 F (36.9 C) (Oral)   Resp 18   LMP 10/18/2021   SpO2 100%  Gen:   Awake, no distress   Resp:  Normal effort  MSK:   Moves extremities without difficulty  Other:  C-spine tenderness, anterior and posterior vertical surgical scars noted.  Decreased range of motion secondary to pain  Medical Decision Making  Medically screening exam initiated at 3:35 PM.  Appropriate orders placed.  [name] was informed that the remainder of the evaluation will be completed by another provider, this initial triage assessment does not replace that evaluation, and the importance of remaining in the ED until their evaluation is complete.  MRI ordered   Theron Arista, PA-C 11/01/21 1538    Mancel Bale, MD 11/01/21 [phone]

## 2021-11-01 NOTE — ED Triage Notes (Signed)
Pt here d/t back pain 10/10 for 1 month. Pt tearful in triage.limited movement in legs d/t pain. Denies trauma or fall.

## 2021-11-02 ENCOUNTER — Encounter: Payer: Self-pay | Admitting: Family Medicine

## 2021-11-02 ENCOUNTER — Ambulatory Visit (INDEPENDENT_AMBULATORY_CARE_PROVIDER_SITE_OTHER): Payer: Medicare HMO | Admitting: Family Medicine

## 2021-11-02 VITALS — BP 130/98 | HR 97 | Ht 66.0 in | Wt 219.0 lb

## 2021-11-02 DIAGNOSIS — F331 Major depressive disorder, recurrent, moderate: Secondary | ICD-10-CM | POA: Diagnosis not present

## 2021-11-02 DIAGNOSIS — M47812 Spondylosis without myelopathy or radiculopathy, cervical region: Secondary | ICD-10-CM

## 2021-11-02 DIAGNOSIS — I10 Essential (primary) hypertension: Secondary | ICD-10-CM | POA: Diagnosis not present

## 2021-11-02 MED ORDER — PREGABALIN 75 MG PO CAPS: 75.0000 mg | ORAL_CAPSULE | Freq: Two times a day (BID) | ORAL | 1 refills | Status: DC

## 2021-11-02 NOTE — Assessment & Plan Note (Signed)
I reviewed her ED encounter note as well as the MRI of her spine. Continue Percocet per pain clinic recommendation. She had been on Lyrica in the past. I discussed restarting this for pain control and she is amendable to this option. Lyrica escribed to her pharmacy. F/U with PT and Neurosurg as planned.

## 2021-11-02 NOTE — Patient Instructions (Signed)
Cervical Radiculopathy ?Cervical radiculopathy means that a nerve in the neck (a cervical nerve) is pinched or bruised. This can happen because of an injury to the cervical spine (vertebrae) in the neck, or as a normal part of getting older. This condition can cause pain or loss of feeling (numbness) that runs from your neck all the way down to your arm and fingers. Often, this condition gets better with rest. Treatment may be needed if the condition does not get better. ?What are the causes? ?A neck injury. ?A bulging disk in your spine. ?Sudden muscle tightening (muscle spasms). ?Tight muscles in your neck due to overuse. ?Arthritis. ?Breakdown in the bones and joints of the spine (spondylosis) due to getting older. ?Bone spurs that form near the nerves in the neck. ?What are the signs or symptoms? ?Pain. The pain may: ?Run from the neck to the arm and hand. ?Be very bad or irritating. ?Get worse when you move your neck. ?Loss of feeling or tingling in your arm or hand. ?Weakness in your arm or hand, in very bad cases. ?How is this treated? ?In many cases, treatment is not needed for this condition. With rest, the condition often gets better over time. If treatment is needed, options may include: ?Wearing a soft neck collar (cervical collar) for short periods of time. ?Doing exercises (physical therapy) to strengthen your neck muscles. ?Taking medicines. ?Having shots (injections) in your spine, in very bad cases. ?Having surgery. This may be needed if other treatments do not help. The type of surgery that is used will depend on the cause of your condition. ?Follow these instructions at home: ?If you have a soft neck collar: ?Wear it as told by your doctor. Take it off only as told by your doctor. ?Ask your doctor if you can take the collar off for cleaning and bathing. If you are allowed to take the collar off for cleaning or bathing: ?Follow instructions from your doctor about how to take off the collar  safely. ?Clean the collar by wiping it with mild soap and water and drying it completely. ?Take out any removable pads in the collar every 1-2 days. Wash them by hand with soap and water. Let them air-dry completely before you put them back in the collar. ?Check your skin under the collar for redness or sores. If you see any, tell your doctor. ?Managing pain ?  ?Take over-the-counter and prescription medicines only as told by your doctor. ?If told, put ice on the painful area. To do this: ?If you have a soft neck collar, take if off as told by your doctor. ?Put ice in a plastic bag. ?Place a towel between your skin and the bag. ?Leave the ice on for 20 minutes, 2-3 times a day. ?Take off the ice if your skin turns bright red. This is very important. If you cannot feel pain, heat, or cold, you have a greater risk of damage to the area. ?If using ice does not help, you can try using heat. Use the heat source that your doctor recommends, such as a moist heat pack or a heating pad. ?Place a towel between your skin and the heat source. ?Leave the heat on for 20-30 minutes. ?Take off the heat if your skin turns bright red. This is very important. If you cannot feel pain, heat, or cold, you have a greater risk of getting burned. ?You may try a gentle neck and shoulder rub (massage). ?Activity ?Rest as needed. ?Return to your normal   activities when your doctor says that it is safe. ?Do exercises as told by your doctor or physical therapist. ?You may have to avoid lifting. Ask your doctor how much you can safely lift. ?General instructions ?Use a flat pillow when you sleep. ?Do not drive while wearing a soft neck collar. If you do not have a soft neck collar, ask your doctor if it is safe to drive while your neck heals. ?Ask your doctor if you should avoid driving or using machines while you are taking your medicine. ?Do not smoke or use any products that contain nicotine or tobacco. If you need help quitting, ask your  doctor. ?Keep all follow-up visits. ?Contact a doctor if: ?Your condition does not get better with treatment. ?Get help right away if: ?Your pain gets worse and medicine does not help. ?You lose feeling or feel weak in your hand, arm, face, or leg. ?You have a high fever. ?Your neck is stiff. ?You cannot control when you poop or pee (have incontinence). ?You have trouble with walking, balance, or talking. ?Summary ?Cervical radiculopathy means that a nerve in the neck is pinched or bruised. ?A nerve can get pinched from a bulging disk, arthritis, an injury to the neck, or other causes. ?Symptoms include pain, tingling, or loss of feeling that goes from the neck to the arm or hand. ?Weakness in your arm or hand can happen in very bad cases. ?Treatment may include resting, wearing a soft neck collar, and doing exercises. You might need to take medicines for pain. In very bad cases, shots or surgery may be needed. ?This information is not intended to replace advice given to you by your health care provider. Make sure you discuss any questions you have with your health care provider. ?Document Revised: 06/03/2021 Document Reviewed: 06/03/2021 ?Elsevier Patient Education ? 2022 [employer] Inc[employer] ? ?

## 2021-11-02 NOTE — Assessment & Plan Note (Signed)
BP improved with mildly elevated diastolic BP. Monitor for now on her current regimen till she attains adequate pain control.

## 2021-11-02 NOTE — Progress Notes (Signed)
SUBJECTIVE:   CHIEF COMPLAINT / HPI:   HTN: The patient is compliant with her Hyzaar 50/12.5 mg QD. She self d/ced her Metoprolol. No new BP concerns.  Neck pain: The patient still has neck pain radiating to her arm despite her Oxy, Flexeril, and Ibuprofen. She went to the ED overnight and was d/ced home with an instruction to f/u with her Neurosurg. She has an appointment with them next week.   Depression: Her pain is worsening her depression. She is not currently suicidal. She does not want to get back on Cymbalta but is now amendable to being referred for counseling.   PERTINENT  PMH / PSH: PMHx reviewed  OBJECTIVE:   BP (!) 130/98   Pulse 97   Ht 5\' 6"  (1.676 m)   Wt 219 lb (99.3 kg)   LMP 10/18/2021   SpO2 100%   BMI 35.35 kg/m   Physical Exam Vitals and nursing note reviewed.  Neck:     Comments: Reduced cervical ROM due to pain Cardiovascular:     Rate and Rhythm: Normal rate and regular rhythm.     Heart sounds: Normal heart sounds. No murmur heard. Pulmonary:     Effort: Pulmonary effort is normal. No respiratory distress.     Breath sounds: Normal breath sounds. No wheezing.  Musculoskeletal:     Cervical back: Tenderness present.  Neurological:     Cranial Nerves: Cranial nerves 2-12 are intact.     Sensory: Sensation is intact.     Motor: Motor function is intact.     Coordination: Coordination is intact.     Comments: Power of 5/5 of both UL  Psychiatric:        Attention and Perception: Attention normal.        Mood and Affect: Mood is depressed.        Thought Content: Thought content does not include suicidal ideation. Thought content does not include suicidal plan.   Flowsheet Row Office Visit from 11/02/2021 in [facility]  PHQ-9 Total Score 20       DG Cervical Spine Complete  Result Date: 11/01/2021 CLINICAL DATA:  Neck pain EXAM: CERVICAL SPINE - COMPLETE 4+ VIEW COMPARISON:  05/05/2021 FINDINGS: No fracture or  static subluxation of the cervical spine. Status post anterior cervical discectomy and fusion of C6-C7 with moderate to severe disc space height loss and osteophytosis of C4 through C6. There is likely some degree of bony neural foraminal stenosis at C4-C5 and C5-C6 bilaterally. Partially imaged skull base, cervical soft tissues, and upper chest are unremarkable. IMPRESSION: 1.  No fracture or static subluxation of the cervical spine. 2. Status post anterior cervical discectomy and fusion of C6-C7 with moderate to severe disc space height loss and osteophytosis of C4 through C6. There is likely some degree of bony neural foraminal stenosis at C4-C5 and C5-C6 bilaterally. Cervical disc and neural foraminal pathology may be further evaluated by MRI if indicated by neurologically localizing signs and symptoms. Electronically Signed   By: 05/07/2021 M.D.   On: 11/01/2021 14:12   MR Cervical Spine Wo Contrast IMPRESSION: 1. Evaluation is somewhat limited by motion artifact. Within this limitation, status post interval ACDF at C6-C7, with resolution of previously noted left paracentral disc protrusion. No abnormal spinal cord signal. No residual spinal canal stenosis at this level. There is mild left neural foraminal narrowing, which appears new from prior exam. 2. C5-C6 mild spinal canal stenosis and moderate bilateral neural foraminal  narrowing, unchanged. 3. C3-C4 and C4-C5 mild spinal canal stenosis and mild bilateral neural foraminal narrowing, unchanged. Electronically Signed   By: Wiliam Ke M.D.   On: 11/01/2021 23:22     ASSESSMENT/PLAN:   Essential hypertension BP improved with mildly elevated diastolic BP. Monitor for now on her current regimen till she attains adequate pain control.   Spondylosis without myelopathy or radiculopathy, cervical region I reviewed her ED encounter note as well as the MRI of her spine. Continue Percocet per pain clinic recommendation. She had been on Lyrica in  the past. I discussed restarting this for pain control and she is amendable to this option. Lyrica escribed to her pharmacy. F/U with PT and Neurosurg as planned.  MDD (major depressive disorder) She is adamant about not starting SSRI. She is ok with referral for counseling. I discussed CCM referral with her, to help with Psyh appointment and she agreed with the plan. Referral placed. I will refer for her to be connect with Psychologist for CBT.     Janit Pagan, MD  [facility]

## 2021-11-02 NOTE — Assessment & Plan Note (Addendum)
She is adamant about not starting SSRI. She is ok with referral for counseling. I discussed CCM referral with her, to help with Psyh appointment and she agreed with the plan. Referral placed. I will refer for her to be connect with Psychologist for CBT.

## 2021-11-02 NOTE — ED Notes (Signed)
DC instructions reviewed with pt. PT verbalized understanding. PT DC °

## 2021-11-03 ENCOUNTER — Telehealth: Payer: Self-pay | Admitting: *Deleted

## 2021-11-03 NOTE — Chronic Care Management (AMB) (Signed)
  Care Management   Outreach Note  11/03/2021 Name: Lisa Newton MRN: [id number] DOB: 07-07-1971  Referred by: Doreene Eland, MD Reason for referral : Care Coordination (Initial outreach to schedule referral with Licensed Clinical SW )   An unsuccessful telephone outreach was attempted today. The patient was referred to the case management team for assistance with care management and care coordination.   Follow Up Plan:  The care management team will reach out to the patient again over the next 7 days. If patient returns call to provider office, please advise to call Embedded Care Management Care Guide Misty Stanley at [phone].  Gwenevere Ghazi  Care Guide, Embedded Care Coordination [facility] Management  Direct Dial: [phone]

## 2021-11-10 ENCOUNTER — Other Ambulatory Visit (HOSPITAL_COMMUNITY): Payer: Self-pay | Admitting: Neurosurgery

## 2021-11-10 ENCOUNTER — Other Ambulatory Visit: Payer: Self-pay | Admitting: Neurosurgery

## 2021-11-10 DIAGNOSIS — M4722 Other spondylosis with radiculopathy, cervical region: Secondary | ICD-10-CM

## 2021-11-10 NOTE — Chronic Care Management (AMB) (Signed)
  Care Management   Note  11/10/2021 Name: Lisa Newton MRN: [id number] DOB: 1971/10/02  Lisa Newton is a 50 y.o. year old female who is a primary care patient of Doreene Eland, MD. I reached out to [employer] Newton by phone today in response to a referral sent by Lisa Newton's primary care provider.   Lisa Newton was given information about care management services today including:  Care management services include personalized support from designated clinical staff supervised by her physician, including individualized plan of care and coordination with other care providers 24/7 contact phone numbers for assistance for urgent and routine care needs. The patient may stop care management services at any time by phone call to the office staff.  Patient agreed to services and verbal consent obtained.   Follow up plan: Telephone appointment with care management team member scheduled for:11/11/21  [facility] Guide, Embedded Care Coordination [facility]  Care Management  Direct Dial: [phone]

## 2021-11-11 ENCOUNTER — Ambulatory Visit: Payer: Medicare HMO | Admitting: Licensed Clinical Social Worker

## 2021-11-11 NOTE — Chronic Care Management (AMB) (Signed)
    Clinical Social Work  Care Management   Phone Outreach    11/11/2021 Name: Lisa Newton MRN: [id number] DOB: Sep 05, 1971  Lisa Newton is a 50 y.o. year old female who is a primary care patient of Doreene Eland, MD .   Reason for referral: Mental Health Counseling and Resources.    CCM LCSW reached out to patient today by phone to introduce self, assess needs and offer Care Management services and interventions.    Patient was unable to keep phone appointment today and requested to reschedule. She was unable to identify a time and date at this time.  Plan:Will route chart to Care Guide to reschedule phone appointment   Review of patient status, including review of consultants reports, relevant laboratory and other test results, and collaboration with appropriate care team members and the patient's provider was performed as part of comprehensive patient evaluation and provision of care management services.     Sammuel Hines, LCSW Care Management & Coordination  [facility] / [employer]   [phone]

## 2021-11-11 NOTE — Patient Instructions (Signed)
° ° ° °  I am sorry you were unable to keep your phone appointment today.   °The Care Guide will contact you to reschedule the phone appointment   °Please call the office if needed ° °Jamani Eley, LCSW °Care Management & Coordination  °[phone]  °

## 2021-11-12 ENCOUNTER — Telehealth: Payer: Self-pay | Admitting: *Deleted

## 2021-11-12 NOTE — Chronic Care Management (AMB) (Signed)
  Care Management   Note  11/12/2021 Name: Lisa Newton MRN: [id number] DOB: 12-03-1971  Lisa Newton is a 50 y.o. year old female who is a primary care patient of Doreene Eland, MD and is actively engaged with the care management team. I reached out to [employer] Cousin by phone today to assist with re-scheduling an initial visit with the Licensed Clinical Social Worker  Follow up plan: A telephone outreach attempt made. Patient states she will call back to reschedule the patient was provided contact information and requesting a return call. The care management team will reach out to the patient again over the next 7 days.  If patient returns call to provider office, please advise to call Embedded Care Management Care Guide Lisa Newton at [phone].  Lisa Newton  Care Guide, Embedded Care Coordination [facility] Management  Direct Dial: [phone]

## 2021-11-15 ENCOUNTER — Encounter (HOSPITAL_COMMUNITY): Payer: Self-pay

## 2021-11-15 ENCOUNTER — Inpatient Hospital Stay (HOSPITAL_COMMUNITY): Admission: RE | Admit: 2021-11-15 | Payer: Medicare HMO | Source: Ambulatory Visit

## 2021-11-15 ENCOUNTER — Ambulatory Visit (HOSPITAL_COMMUNITY): Payer: Medicare HMO

## 2021-11-19 NOTE — Chronic Care Management (AMB) (Signed)
  Care Management   Note  11/19/2021 Name: Lisa Newton MRN: [id number] DOB: 06-Feb-1971  Chriss Czar [name] is a 50 y.o. year old female who is a primary care patient of Doreene Eland, MD and is actively engaged with the care management team. I reached out to [employer] Cousin by phone today to assist with re-scheduling an initial visit with the Licensed Clinical Social Worker  Follow up plan: Patient declines further follow up and engagement by the care management team. Appropriate care team members and provider have been notified via electronic communication. . The care management team is available to follow up with the patient after provider conversation with the patient regarding recommendation for care management engagement and subsequent re-referral to the care management team.   [facility] Guide, Embedded Care Coordination [facility]  Care Management  Direct Dial: [phone]

## 2021-12-10 ENCOUNTER — Ambulatory Visit
Admission: RE | Admit: 2021-12-10 | Discharge: 2021-12-10 | Disposition: A | Discharge status: Home / Self Care | Payer: Medicare HMO | Source: Ambulatory Visit | Attending: Family Medicine | Admitting: Family Medicine

## 2021-12-10 DIAGNOSIS — Z1231 Encounter for screening mammogram for malignant neoplasm of breast: Secondary | ICD-10-CM

## 2021-12-14 ENCOUNTER — Telehealth: Payer: Self-pay

## 2021-12-14 ENCOUNTER — Other Ambulatory Visit: Payer: Self-pay | Admitting: Family Medicine

## 2021-12-14 DIAGNOSIS — N6310 Unspecified lump in the right breast, unspecified quadrant: Secondary | ICD-10-CM

## 2021-12-14 DIAGNOSIS — R928 Other abnormal and inconclusive findings on diagnostic imaging of breast: Secondary | ICD-10-CM

## 2021-12-14 MED ORDER — HYDROXYZINE HCL 25 MG PO TABS: 25.0000 mg | ORAL_TABLET | Freq: Four times a day (QID) | ORAL | 0 refills | Status: DC | PRN

## 2021-12-14 NOTE — Telephone Encounter (Signed)
Patient calls nurse line reporting she saw mammogram results on mychart. Patient would like to speak with PCP about next steps.   Will forward to PCP.

## 2021-12-14 NOTE — Telephone Encounter (Signed)
Result and recommendation discussed with the patient.  U/S ordered.  I advised her to contact [facility] to schedule an appointment with them. She stated that she still have their phone number and will call for an appointment.  She mentioned that she is having issues with her nerve and wanted something for that. She declined SSRI in the past and still declined today. I recommended Hydroxyzine prn. She is amendable to trying this. Psych f/u discussed. She is undecided on Psych referral either.

## 2022-01-11 ENCOUNTER — Ambulatory Visit (HOSPITAL_COMMUNITY)
Admission: EM | Admit: 2022-01-11 | Discharge: 2022-01-11 | Disposition: A | Discharge status: Home / Self Care | Payer: Medicare Other | Attending: Urgent Care | Admitting: Urgent Care

## 2022-01-11 ENCOUNTER — Other Ambulatory Visit: Payer: Self-pay

## 2022-01-11 ENCOUNTER — Ambulatory Visit: Payer: Medicaid Other

## 2022-01-11 ENCOUNTER — Encounter (HOSPITAL_COMMUNITY): Payer: Self-pay | Admitting: Emergency Medicine

## 2022-01-11 DIAGNOSIS — B340 Adenovirus infection, unspecified: Secondary | ICD-10-CM | POA: Diagnosis not present

## 2022-01-11 DIAGNOSIS — R112 Nausea with vomiting, unspecified: Secondary | ICD-10-CM

## 2022-01-11 DIAGNOSIS — B309 Viral conjunctivitis, unspecified: Secondary | ICD-10-CM | POA: Diagnosis not present

## 2022-01-11 DIAGNOSIS — M539 Dorsopathy, unspecified: Secondary | ICD-10-CM | POA: Diagnosis not present

## 2022-01-11 DIAGNOSIS — Z79899 Other long term (current) drug therapy: Secondary | ICD-10-CM | POA: Diagnosis not present

## 2022-01-11 DIAGNOSIS — R030 Elevated blood-pressure reading, without diagnosis of hypertension: Secondary | ICD-10-CM | POA: Diagnosis not present

## 2022-01-11 MED ORDER — PREDNISONE 20 MG PO TABS: 20.0000 mg | ORAL_TABLET | Freq: Every day | ORAL | 0 refills | Status: AC

## 2022-01-11 MED ORDER — TOBRAMYCIN-DEXAMETHASONE 0.3-0.1 % OP SUSP
1.0000 [drp] | Freq: Four times a day (QID) | OPHTHALMIC | 0 refills | Status: AC
Start: 2022-01-11 — End: 2022-01-16

## 2022-01-11 MED ORDER — ONDANSETRON 8 MG PO TBDP: 8.0000 mg | ORAL_TABLET | Freq: Three times a day (TID) | ORAL | 0 refills | Status: DC | PRN
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
## 2022-01-11 NOTE — ED Triage Notes (Signed)
Pt reports was around her grand kids who were sick with pink eye. Pt reports woke up yesterday with right eye swelling and drainage.

## 2022-01-11 NOTE — Discharge Instructions (Addendum)
The antibiotic eye ointment will help prevent a secondary bacterial infection, but unfortunately will likely not expedite the treatment of your eye.  Please call your eye doctor today to see if additional treatment may be helpful. I am concerned you are developing epidemic keratoconjunctivitis. Please take the oral prednisone as prescribed to help with the eyelid swelling. Use the Zofran as needed for your nausea. Your symptoms are most likely due to adenovirus. Please stay out of work for a minimum of two more days.

## 2022-01-12 DIAGNOSIS — B309 Viral conjunctivitis, unspecified: Secondary | ICD-10-CM | POA: Diagnosis not present

## 2022-01-13 ENCOUNTER — Other Ambulatory Visit: Payer: Medicare HMO

## 2022-01-13 DIAGNOSIS — Z79899 Other long term (current) drug therapy: Secondary | ICD-10-CM | POA: Diagnosis not present

## 2022-01-18 ENCOUNTER — Other Ambulatory Visit: Payer: Self-pay | Admitting: Family Medicine

## 2022-01-18 ENCOUNTER — Ambulatory Visit (INDEPENDENT_AMBULATORY_CARE_PROVIDER_SITE_OTHER): Payer: Medicare Other

## 2022-01-18 ENCOUNTER — Other Ambulatory Visit: Payer: Self-pay

## 2022-01-18 DIAGNOSIS — Z0000 Encounter for general adult medical examination without abnormal findings: Secondary | ICD-10-CM | POA: Diagnosis not present

## 2022-01-18 MED ORDER — HYDROXYZINE HCL 25 MG PO TABS: 25.0000 mg | ORAL_TABLET | Freq: Four times a day (QID) | ORAL | 1 refills | Status: DC | PRN

## 2022-01-18 NOTE — Patient Instructions (Addendum)
You spoke to Lisa Newton, [place] over the phone for your annual wellness visit.  We discussed goals:   Goals      LIFESTYLE - Manage Stress       We also discussed recommended health maintenance. As discussed, you are due for: Health Maintenance  Topic Date Due   Zoster Vaccines- Shingrix (1 of 2) Never done   COLONOSCOPY (Pts 45-110yrs Insurance coverage will need to be confirmed)  Never done   INFLUENZA VACCINE  08/12/2022 (Originally 07/12/2021)   COVID-19 Vaccine (1) 08/12/2022 (Originally 07/03/1971)   MAMMOGRAM  12/10/2022   PAP SMEAR-Modifier  06/02/2025   TETANUS/TDAP  05/08/2028   Hepatitis C Screening  Completed   HIV Screening  Completed   HPV VACCINES  Aged Out   PCP apt scheduled for 2/10. I will call your pharmacy and check on Hydroxyzine prescription. Good luck with your new job!  Health Maintenance, Female Adopting a healthy lifestyle and getting preventive care are important in promoting health and wellness. Ask your health care provider about: The right schedule for you to have regular tests and exams. Things you can do on your own to prevent diseases and keep yourself healthy. What should I know about diet, weight, and exercise? Eat a healthy diet  Eat a diet that includes plenty of vegetables, fruits, low-fat dairy products, and lean protein. Do not eat a lot of foods that are high in solid fats, added sugars, or sodium. Maintain a healthy weight Body mass index (BMI) is used to identify weight problems. It estimates body fat based on height and weight. Your health care provider can help determine your BMI and help you achieve or maintain a healthy weight. Get regular exercise Get regular exercise. This is one of the most important things you can do for your health. Most adults should: Exercise for at least 150 minutes each week. The exercise should increase your heart rate and make you sweat (moderate-intensity exercise). Do strengthening exercises at least  twice a week. This is in addition to the moderate-intensity exercise. Spend less time sitting. Even light physical activity can be beneficial. Watch cholesterol and blood lipids Have your blood tested for lipids and cholesterol at 50 years of age, then have this test every 5 years. Have your cholesterol levels checked more often if: Your lipid or cholesterol levels are high. You are older than 50 years of age. You are at high risk for heart disease. What should I know about cancer screening? Depending on your health history and family history, you may need to have cancer screening at various ages. This may include screening for: Breast cancer. Cervical cancer. Colorectal cancer. Skin cancer. Lung cancer. What should I know about heart disease, diabetes, and high blood pressure? Blood pressure and heart disease High blood pressure causes heart disease and increases the risk of stroke. This is more likely to develop in people who have high blood pressure readings or are overweight. Have your blood pressure checked: Every 3-5 years if you are 56-65 years of age. Every year if you are 2 years old or older. Diabetes Have regular diabetes screenings. This checks your fasting blood sugar level. Have the screening done: Once every three years after age 46 if you are at a normal weight and have a low risk for diabetes. More often and at a younger age if you are overweight or have a high risk for diabetes. What should I know about preventing infection? Hepatitis B If you have a higher  risk for hepatitis B, you should be screened for this virus. Talk with your health care provider to find out if you are at risk for hepatitis B infection. Hepatitis C Testing is recommended for: Everyone born from [birth year] through 1964. Anyone with known risk factors for hepatitis C. Sexually transmitted infections (STIs) Get screened for STIs, including gonorrhea and chlamydia, if: You are sexually active and are  younger than 50 years of age. You are older than 50 years of age and your health care provider tells you that you are at risk for this type of infection. Your sexual activity has changed since you were last screened, and you are at increased risk for chlamydia or gonorrhea. Ask your health care provider if you are at risk. Ask your health care provider about whether you are at high risk for HIV. Your health care provider may recommend a prescription medicine to help prevent HIV infection. If you choose to take medicine to prevent HIV, you should first get tested for HIV. You should then be tested every 3 months for as long as you are taking the medicine. Pregnancy If you are about to stop having your period (premenopausal) and you may become pregnant, seek counseling before you get pregnant. Take 400 to 800 micrograms (mcg) of folic acid every day if you become pregnant. Ask for birth control (contraception) if you want to prevent pregnancy. Osteoporosis and menopause Osteoporosis is a disease in which the bones lose minerals and strength with aging. This can result in bone fractures. If you are 42 years old or older, or if you are at risk for osteoporosis and fractures, ask your health care provider if you should: Be screened for bone loss. Take a calcium or vitamin D supplement to lower your risk of fractures. Be given hormone replacement therapy (HRT) to treat symptoms of menopause. Follow these instructions at home: Alcohol use Do not drink alcohol if: Your health care provider tells you not to drink. You are pregnant, may be pregnant, or are planning to become pregnant. If you drink alcohol: Limit how much you have to: 0-1 drink a day. Know how much alcohol is in your drink. In the [place] one drink equals one 12 oz bottle of beer (355 mL), one 5 oz glass of wine (148 mL), or one 1 oz glass of hard liquor (44 mL). Lifestyle Do not use any products that contain nicotine or tobacco. These  products include cigarettes, chewing tobacco, and vaping devices, such as e-cigarettes. If you need help quitting, ask your health care provider. Do not use street drugs. Do not share needles. Ask your health care provider for help if you need support or information about quitting drugs. General instructions Schedule regular health, dental, and eye exams. Stay current with your vaccines. Tell your health care provider if: You often feel depressed. You have ever been abused or do not feel safe at home. Summary Adopting a healthy lifestyle and getting preventive care are important in promoting health and wellness. Follow your health care provider's instructions about healthy diet, exercising, and getting tested or screened for diseases. Follow your health care provider's instructions on monitoring your cholesterol and blood pressure. This information is not intended to replace advice given to you by your health care provider. Make sure you discuss any questions you have with your health care provider. Document Revised: 04/19/2021 Document Reviewed: 04/19/2021 Elsevier Patient Education  [place].  Hypertension, Adult High blood pressure (hypertension) is when the force of blood  pumping through the arteries is too strong. The arteries are the blood vessels that carry blood from the heart throughout the body. Hypertension forces the heart to work harder to pump blood and may cause arteries to become narrow or stiff. Untreated or uncontrolled hypertension can cause a heart attack, heart failure, a stroke, kidney disease, and other problems. A blood pressure reading consists of a higher number over a lower number. Ideally, your blood pressure should be below 120/80. The first ("top") number is called the systolic pressure. It is a measure of the pressure in your arteries as your heart beats. The second ("bottom") number is called the diastolic pressure. It is a measure of the pressure in your  arteries as the heart relaxes. What are the causes? The exact cause of this condition is not known. There are some conditions that result in or are related to high blood pressure. What increases the risk? Some risk factors for high blood pressure are under your control. The following factors may make you more likely to develop this condition: Smoking. Having type 2 diabetes mellitus, high cholesterol, or both. Not getting enough exercise or physical activity. Being overweight. Having too much fat, sugar, calories, or salt (sodium) in your diet. Drinking too much alcohol. Some risk factors for high blood pressure may be difficult or impossible to change. Some of these factors include: Having chronic kidney disease. Having a family history of high blood pressure. Age. Risk increases with age. Race. You may be at higher risk if you are African American. Gender. Men are at higher risk than women before age 70. After age 93, women are at higher risk than men. Having obstructive sleep apnea. Stress. What are the signs or symptoms? High blood pressure may not cause symptoms. Very high blood pressure (hypertensive crisis) may cause: Headache. Anxiety. Shortness of breath. Nosebleed. Nausea and vomiting. Vision changes. Severe chest pain. Seizures. How is this diagnosed? This condition is diagnosed by measuring your blood pressure while you are seated, with your arm resting on a flat surface, your legs uncrossed, and your feet flat on the floor. The cuff of the blood pressure monitor will be placed directly against the skin of your upper arm at the level of your heart. It should be measured at least twice using the same arm. Certain conditions can cause a difference in blood pressure between your right and left arms. Certain factors can cause blood pressure readings to be lower or higher than normal for a short period of time: When your blood pressure is higher when you are in a health care  provider's office than when you are at home, this is called white coat hypertension. Most people with this condition do not need medicines. When your blood pressure is higher at home than when you are in a health care provider's office, this is called masked hypertension. Most people with this condition may need medicines to control blood pressure. If you have a high blood pressure reading during one visit or you have normal blood pressure with other risk factors, you may be asked to: Return on a different day to have your blood pressure checked again. Monitor your blood pressure at home for 1 week or longer. If you are diagnosed with hypertension, you may have other blood or imaging tests to help your health care provider understand your overall risk for other conditions. How is this treated? This condition is treated by making healthy lifestyle changes, such as eating healthy foods, exercising more, and  reducing your alcohol intake. Your health care provider may prescribe medicine if lifestyle changes are not enough to get your blood pressure under control, and if: Your systolic blood pressure is above 130. Your diastolic blood pressure is above 80. Your personal target blood pressure may vary depending on your medical conditions, your age, and other factors. Follow these instructions at home: Eating and drinking  Eat a diet that is high in fiber and potassium, and low in sodium, added sugar, and fat. An example eating plan is called the DASH (Dietary Approaches to Stop Hypertension) diet. To eat this way: Eat plenty of fresh fruits and vegetables. Try to fill one half of your plate at each meal with fruits and vegetables. Eat whole grains, such as whole-wheat pasta, brown rice, or whole-grain bread. Fill about one fourth of your plate with whole grains. Eat or drink low-fat dairy products, such as skim milk or low-fat yogurt. Avoid fatty cuts of meat, processed or cured meats, and poultry with  skin. Fill about one fourth of your plate with lean proteins, such as fish, chicken without skin, beans, eggs, or tofu. Avoid pre-made and processed foods. These tend to be higher in sodium, added sugar, and fat. Reduce your daily sodium intake. Most people with hypertension should eat less than 1,500 mg of sodium a day. Do not drink alcohol if: Your health care provider tells you not to drink. You are pregnant, may be pregnant, or are planning to become pregnant. If you drink alcohol: Limit how much you use to: 0-1 drink a day for women. 0-2 drinks a day for men. Be aware of how much alcohol is in your drink. In the [place] one drink equals one 12 oz bottle of beer (355 mL), one 5 oz glass of wine (148 mL), or one 1 oz glass of hard liquor (44 mL). Lifestyle  Work with your health care provider to maintain a healthy body weight or to lose weight. Ask what an ideal weight is for you. Get at least 30 minutes of exercise most days of the week. Activities may include walking, swimming, or biking. Include exercise to strengthen your muscles (resistance exercise), such as Pilates or lifting weights, as part of your weekly exercise routine. Try to do these types of exercises for 30 minutes at least 3 days a week. Do not use any products that contain nicotine or tobacco, such as cigarettes, e-cigarettes, and chewing tobacco. If you need help quitting, ask your health care provider. Monitor your blood pressure at home as told by your health care provider. Keep all follow-up visits as told by your health care provider. This is important. Medicines Take over-the-counter and prescription medicines only as told by your health care provider. Follow directions carefully. Blood pressure medicines must be taken as prescribed. Do not skip doses of blood pressure medicine. Doing this puts you at risk for problems and can make the medicine less effective. Ask your health care provider about side effects or  reactions to medicines that you should watch for. Contact a health care provider if you: Think you are having a reaction to a medicine you are taking. Have headaches that keep coming back (recurring). Feel dizzy. Have swelling in your ankles. Have trouble with your vision. Get help right away if you: Develop a severe headache or confusion. Have unusual weakness or numbness. Feel faint. Have severe pain in your chest or abdomen. Vomit repeatedly. Have trouble breathing. Summary Hypertension is when the force of blood pumping through  your arteries is too strong. If this condition is not controlled, it may put you at risk for serious complications. Your personal target blood pressure may vary depending on your medical conditions, your age, and other factors. For most people, a normal blood pressure is less than 120/80. Hypertension is treated with lifestyle changes, medicines, or a combination of both. Lifestyle changes include losing weight, eating a healthy, low-sodium diet, exercising more, and limiting alcohol. This information is not intended to replace advice given to you by your health care provider. Make sure you discuss any questions you have with your health care provider. Document Revised: 08/08/2018 Document Reviewed: 08/08/2018 Elsevier Patient Education  2022 [facility]'s number is [phone]. Please call with questions or concerns about what we discussed today.

## 2022-01-18 NOTE — Progress Notes (Signed)
Patient ID: Lisa Newton, female   DOB: August 13, 1971, 51 y.o.   MRN: [id number]  I have reviewed this visit and agree with the documentation.

## 2022-01-18 NOTE — Progress Notes (Signed)
Subjective:   Lisa ErikssonLatonya L Newton is a 51 y.o. female who presents for Medicare Annual (Subsequent) preventive examination.  Patient consented to have virtual visit and was identified by name and date of birth. Method of visit: Telephone  Encounter participants: Patient: Lisa ErikssonLatonya L Devera - located at Home Nurse/Provider: [name] - [facility] Others (if applicable): NA  Review of Systems: Defer to PCP.  Cardiac Risk Factors include: hypertension  Objective:   Vitals: LMP 12/30/2021   There is no height or weight on file to calculate BMI.  Advanced Directives 01/18/2022 11/02/2021 10/26/2021 05/03/2021 04/06/2021 03/18/2021 10/16/2020  Does Patient Have a Medical Advance Directive? No No No No No No No  Would patient like information on creating a medical advance directive? No - Patient declined No - Patient declined No - Patient declined No - Patient declined No - Patient declined No - Patient declined No - Patient declined   Tobacco Social History   Tobacco Use  Smoking Status Never  Smokeless Tobacco Never     Clinical Intake:  Pre-visit preparation completed: Yes  Pain Score: 3   How often do you need to have someone help you when you read instructions, pamphlets, or other written materials from your doctor or pharmacy?: 2 - Rarely What is the last grade level you completed in school?: High School  Past Medical History:  Diagnosis Date   Abnormal MRI, cervical spine (07/25/2018) 09/03/2018   Disc levels: C2-3: The disc appears normal. Stable mild bilateral facet hypertrophy. No spinal stenosis or nerve root encroachment. C3-4: Stable mild uncinate spurring and facet hypertrophy. No spinal stenosis or nerve root encroachment. C4-5: Stable chronic spondylosis with posterior osteophytes covering diffusely bulging disc material. The AP diameter of the canal is chronically narrowed to 8 mm   Abnormal MRI, lumbar spine (07/25/2018) 09/03/2018   Disc levels: L4-5: The  patient is status post right laminotomy for discectomy. There is some loss of disc space height. Fat about the descending right L4 root is obliterated in the subarticular recess. This is likely due to the presence of granulation tissue. No definite mass effect on the root is seen and there is no mass effect on the thecal sac. Foramina are open. 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   Herniation of cervical intervertebral disc with radiculopathy 03/14/2017   Hypertension    Hypothyroidism    Issue of medical certificate for disability examination 10/25/2013   Left ear hearing loss    Lumbar radiculitis (Bilateral) 08/06/2018   Neck pain    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   Parotitis, acute 06/11/2018   PONV (postoperative nausea and vomiting)    Poor social situation 09/13/2017   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/2018   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   Past Surgical History:  Procedure Laterality Date   ANTERIOR CERVICAL DECOMP/DISCECTOMY FUSION N/A 05/05/2021   Procedure: Anterior Cervical Decompression Fusion  - Cervical six-Cervical seven;  Surgeon: Coletta Memosabbell, Kyle, MD;  [facility];  Service: Neurosurgery;  Laterality: N/A;   cholesterol granuloma  left ear   left ear surgery     ruptured TM   LUMBAR LAMINECTOMY/DECOMPRESSION MICRODISCECTOMY Right 12/14/2017   Procedure: MICRODISCECTOMY LUMBAR FOUR- LUMBAR FIVE - RIGHT;  Surgeon: Coletta Memos, MD;  Location: [facility] OR;  Service: Neurosurgery;  Laterality: Right;  MICRODISCECTOMY LUMBAR 4-  LUMBAR 5 - RIGHT   POSTERIOR CERVICAL FUSION/FORAMINOTOMY N/A 03/14/2017   Procedure: LEFT C6-7 FORAMINOTOMY WITH EXCISION OF HERNIATED NUCLEUS PULPOSUS;  Surgeon: Kerrin Champagne, MD;  Location: [facility] OR;  Service: Orthopedics;  Laterality: N/A;   Family History  Problem Relation Age of Onset   Lung cancer Mother 12   Diabetes Father    Hypertension Father    Thyroid disease Sister    Thyroid disease Sister    Eczema Daughter    Stroke Daughter    Mental illness Son    Keloids Son    Asthma Neg Hx    Urticaria Neg Hx    Allergic rhinitis Neg Hx    Breast cancer Neg Hx    Social History   Socioeconomic History   Marital status: Single    Spouse name: Not on file   Number of children: 3   Years of education: some college   Highest education level: 12th grade  Occupational History   Occupation: Disabled   Occupation: Engineer, materials  Tobacco Use   Smoking status: Never   Smokeless tobacco: Never  Building services engineer Use: Never used  Substance and Sexual Activity   Alcohol use: Not Currently    Alcohol/week: 0.0 standard drinks   Drug use: Not Currently    Types: Marijuana   Sexual activity: Yes    Birth control/protection: None  Other Topics Concern   Not on file  Social History Narrative   Lives in [place] with her youngest daughter.    Patient has three children- they all live in the area.    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, materials "part time."        Social Determinants of Corporate investment banker Strain: Medium Risk   Difficulty of Paying Living Expenses: Somewhat hard  Food Insecurity: No Food Insecurity   Worried About Programme researcher, broadcasting/film/video in the Last Year: Never true   Barista in the Last Year: Never true  Transportation Needs: No Transportation Needs   Lack of Transportation (Medical): No   Lack of Transportation (Non-Medical): No  Physical Activity: Inactive   Days of Exercise per Week: 0 days   Minutes of Exercise per Session:  0 min  Stress: Stress Concern Present   Feeling of Stress : Very much  Social Connections: Moderately Isolated   Frequency of Communication with Friends and Family: More than three times a week   Frequency of Social Gatherings with Friends and Family: More than three times a week   Attends Religious Services: More than 4 times per year   Active Member of [employer] or Organizations: No   Attends Banker Meetings: Never   Marital Status: Divorced   Outpatient Encounter Medications as of 01/18/2022  Medication Sig   atorvastatin (LIPITOR) 40 MG tablet Take 1 tablet (40 mg total) by mouth daily.   B Complex-C (SUPER B COMPLEX/VITAMIN C PO) Take by mouth daily.   hydrOXYzine (ATARAX) 25 MG tablet Take 1 tablet (25 mg total) by mouth every 6 (six) hours as needed for anxiety.   levocetirizine (XYZAL) 5 MG tablet TAKE 1 TABLET BY MOUTH EVERY DAY IN  THE EVENING   levothyroxine (SYNTHROID) 75 MCG tablet Take 1 tablet (75 mcg total) by mouth daily before breakfast.   losartan-hydrochlorothiazide (HYZAAR) 50-12.5 MG tablet Take 1 tablet by mouth daily.   ondansetron (ZOFRAN-ODT) 8 MG disintegrating tablet Take 1 tablet (8 mg total) by mouth every 8 (eight) hours as needed for nausea or vomiting.   oxyCODONE-acetaminophen (PERCOCET) 10-325 MG tablet Take 1 tablet by mouth 3 (three) times daily as needed.   pregabalin (LYRICA) 75 MG capsule Take 1 capsule (75 mg total) by mouth 2 (two) times daily.   cyclobenzaprine (FLEXERIL) 10 MG tablet Take 10 mg by mouth 3 (three) times daily as needed for muscle spasms. (Patient not taking: Reported on 01/18/2022)   Multiple Vitamins-Minerals (HAIR/SKIN/NAILS/BIOTIN PO) Take 2 each by mouth daily. (Patient not taking: Reported on 01/18/2022)   vitamin B-12 (CYANOCOBALAMIN) 1000 MCG tablet Take 1,000 mcg by mouth daily. (Patient not taking: Reported on 10/26/2021)   [DISCONTINUED] fluticasone (FLONASE) 50 MCG/ACT nasal spray Place 2 sprays into both nostrils  daily. (Patient not taking: Reported on 06/02/2020)   [DISCONTINUED] loratadine (CLARITIN) 10 MG tablet Take 1 tablet (10 mg total) by mouth daily.   [DISCONTINUED] SUMAtriptan (IMITREX) 50 MG tablet TAKE 1 TABLET AS NEEDED FOR MIGRAINE HEADACHE. MAY REPEAT DOSE ONCE AFTER 2 HRS. DON'T TAKE MORE THAN 200 MG/DAY.   No facility-administered encounter medications on file as of 01/18/2022.   Activities of Daily Living In your present state of health, do you have any difficulty performing the following activities: 01/18/2022 01/18/2022  Hearing? Y N  Comment Sees Audiology -  Vision? - N  Difficulty concentrating or making decisions? - N  Walking or climbing stairs? - Y  Dressing or bathing? - N  Doing errands, shopping? - N  Quarry manager and eating ? - N  Using the Toilet? - N  In the past six months, have you accidently leaked urine? - N  Do you have problems with loss of bowel control? - N  Managing your Medications? - N  Managing your Finances? - N  Housekeeping or managing your Housekeeping? - N  Some recent data might be hidden   Patient Care Team: Doreene Eland, MD as PCP - General (Family Medicine) Kerrin Champagne, MD as Consulting Physician (Orthopedic Surgery) Delano Metz, MD as Referring Physician (Pain Medicine)    Assessment:   This is a routine wellness examination for Lisa Newton.  Exercise Activities and Dietary recommendations Current Exercise Habits: The patient does not participate in regular exercise at present, Exercise limited by: orthopedic condition(s);cardiac condition(s)   Goals      LIFESTYLE - Manage Stress       Fall Risk Fall Risk  01/18/2022 11/29/2019 04/25/2019 04/11/2019 09/03/2018  Falls in the past year? 0 0 - 0 No  Number falls in past yr: 0 0 0 0 -  Injury with Fall? 0 0 0 0 -  Risk for fall due to : Orthopedic patient - - - -  Follow up Falls prevention discussed - Falls evaluation completed - -   Patient reports normal gait and balance.  Patient does not use assistive devices to ambulate.   Is the patient's home free of loose throw rugs in walkways, pet beds, electrical cords, etc?   yes      Grab bars in the bathroom? yes      Handrails on the stairs?   yes      Adequate lighting?   yes  Patient rating of  health (0-10) scale: 6  Depression Screen PHQ 2/9 Scores 01/18/2022 11/02/2021 10/26/2021 04/06/2021  PHQ - 2 Score 6 6 4 6   PHQ- 9 Score 19 20 16 18   Exception Documentation - - - -    Cognitive Function 6CIT Screen 01/18/2022  What Year? 0 points  What month? 0 points  What time? 0 points  Count back from 20 0 points  Months in reverse 0 points  Repeat phrase 0 points  Total Score 0   Immunization History  Administered Date(s) Administered   Influenza Split 01/11/2012   Influenza,inj,Quad PF,6+ Mos 10/25/2013, 12/03/2014, 10/06/2015   PPD Test 05/20/2011, 03/18/2014, 08/07/2019, 02/10/2020   Td 03/12/2004   Tdap 05/08/2018   Screening Tests Health Maintenance  Topic Date Due   Zoster Vaccines- Shingrix (1 of 2) Never done   COLONOSCOPY (Pts 45-53yrs Insurance coverage will need to be confirmed)  Never done   INFLUENZA VACCINE  08/12/2022 (Originally 07/12/2021)   COVID-19 Vaccine (1) 08/12/2022 (Originally 07/03/1971)   MAMMOGRAM  12/10/2022   PAP SMEAR-Modifier  06/02/2025   TETANUS/TDAP  05/08/2028   Hepatitis C Screening  Completed   HIV Screening  Completed   HPV VACCINES  Aged Out   Cancer Screenings: Lung: Low Dose CT Chest recommended if Age 83-80 years, 20 pack-year currently smoking OR have quit w/in 15years. Patient does not qualify. Breast:  Up to date on Mammogram? Yes   Up to date of Bone Density/Dexa? NA Colorectal: Never- Education provided  Cervical: Las\t Pap Smear: 06/02/2020- ASCUS HPV Negative  Additional Screenings: Hepatitis C Screening: Completed  HIV Screening: Completed   Plan:  PCP apt scheduled for 2/10. I will call your pharmacy and check on Hydroxyzine  prescription. Good luck with your new job!  I have personally reviewed and noted the following in the patients chart:   Medical and social history Use of alcohol, tobacco or illicit drugs  Current medications and supplements Functional ability and status Nutritional status Physical activity Advanced directives List of other physicians Hospitalizations, surgeries, and ER visits in previous 12 months Vitals Screenings to include cognitive, depression, and falls Referrals and appointments  In addition, I have reviewed and discussed with patient certain preventive protocols, quality metrics, and best practice recommendations. A written personalized care plan for preventive services as well as general preventive health recommendations were provided to patient.  This visit was conducted virtually in the setting of the COVID19 pandemic.    06/04/2020, CMA  01/18/2022

## 2022-01-21 ENCOUNTER — Telehealth: Payer: Self-pay | Admitting: Psychology

## 2022-01-21 ENCOUNTER — Other Ambulatory Visit: Payer: Self-pay

## 2022-01-21 ENCOUNTER — Ambulatory Visit (INDEPENDENT_AMBULATORY_CARE_PROVIDER_SITE_OTHER): Payer: Medicare Other | Admitting: Family Medicine

## 2022-01-21 ENCOUNTER — Encounter: Payer: Self-pay | Admitting: Family Medicine

## 2022-01-21 VITALS — BP 122/89 | HR 85 | Ht 66.0 in | Wt 224.0 lb

## 2022-01-21 DIAGNOSIS — M47812 Spondylosis without myelopathy or radiculopathy, cervical region: Secondary | ICD-10-CM | POA: Diagnosis not present

## 2022-01-21 DIAGNOSIS — Z1211 Encounter for screening for malignant neoplasm of colon: Secondary | ICD-10-CM | POA: Diagnosis not present

## 2022-01-21 DIAGNOSIS — F331 Major depressive disorder, recurrent, moderate: Secondary | ICD-10-CM

## 2022-01-21 DIAGNOSIS — H10213 Acute toxic conjunctivitis, bilateral: Secondary | ICD-10-CM | POA: Diagnosis not present

## 2022-01-21 MED ORDER — ZOSTER VAC RECOMB ADJUVANTED 50 MCG/0.5ML IM SUSR: 0.5000 mL | Freq: Once | INTRAMUSCULAR | 1 refills | Status: AC

## 2022-01-21 NOTE — Assessment & Plan Note (Signed)
No red eye. Normal eye exam. She was evaluated by ophthalmologist last week and no treatment was offered. She was advised that her symptoms will take it's course and self resolve. She has follow-up scheduled with ophthal Monitor closely for now.

## 2022-01-21 NOTE — Assessment & Plan Note (Signed)
Pain persists. I recommended against use of Marijuana. She will f/u with pain management clinic for further recommendation. Consider referral back to PT.

## 2022-01-21 NOTE — Progress Notes (Signed)
° ° °  SUBJECTIVE:   CHIEF COMPLAINT / HPI:   Conjunctivitis  Episode onset: runny right eye with no redness or pain x 10 days ago. She had swollen eyes with blurry vision. Vision is back to baseline now. The onset was gradual. The problem has been gradually improving. The problem is moderate. Relieved by: She got an eye drop from recent ED visit which helped. Pertinent negatives include no decreased vision, no ear pain, no rhinorrhea and no eye redness. There were no sick contacts. Recent Medical Care: She was referred to [facility] by ED. She was evaluated by them and return appointment was scheduled for 01/26/22.   Neck pain/MDD: The patient continues to have issues with her neck. She is compliant with her Percocet prescribed by the pain clinic. However, this does not help with her pain. Her neck pain makes her more depressed. She denies SI or HI. She stated that her pain responds only to Marijuana. No other concerns.  Keloid: She asked if she could get her neck keloid treated. This developed a few weeks after her neck surgery and gradually increased in size. No pain.  PERTINENT  PMH / PSH: PMHx reviewed  OBJECTIVE:   BP 122/89    Pulse 85    Ht 5\' 6"  (1.676 m)    Wt 224 lb (101.6 kg)    LMP 12/30/2021    SpO2 100%    BMI 36.15 kg/m   Physical Exam Vitals and nursing note reviewed.  Eyes:     General: Lids are normal.        Right eye: No discharge.        Left eye: No discharge.     Extraocular Movements: Extraocular movements intact.     Conjunctiva/sclera: Conjunctivae normal.  Cardiovascular:     Rate and Rhythm: Normal rate and regular rhythm.     Heart sounds: Normal heart sounds. No murmur heard. Pulmonary:     Effort: Pulmonary effort is normal. No respiratory distress.     Breath sounds: Normal breath sounds. No wheezing.  Skin:    Comments: Linear, horizontal, skin color tumor/keloid on her upper neck  Neurological:     Mental Status: She is oriented to person,  place, and time.  Psychiatric:        Behavior: Behavior normal.        Thought Content: Thought content does not include homicidal or suicidal ideation.     ASSESSMENT/PLAN:   Conjunctivitis No red eye. Normal eye exam. She was evaluated by ophthalmologist last week and no treatment was offered. She was advised that her symptoms will take it's course and self resolve. She has follow-up scheduled with ophthal Monitor closely for now.  Spondylosis without myelopathy or radiculopathy, cervical region Pain persists. I recommended against use of Marijuana. She will f/u with pain management clinic for further recommendation. Consider referral back to PT.  MDD (major depressive disorder) It is related to chronic pain. I discussed SSRI resumption, including Cymbalta. She does not want to start this. She is ok with being referred for counseling. She agreed to be referred to [facility] for short-term intervention. I will refer her to Dr. Hartford Poli for counseling for depression and help with coping skills for her pain.    HM: Cologuard and shingrix discussed. Cologuard ordered Shingrix escribed.  Andrena Mews, MD [place]

## 2022-01-21 NOTE — Telephone Encounter (Signed)
Scheduled for [facility] appt 2/13 at 10am   Royetta Asal, PhD., LMFT

## 2022-01-21 NOTE — Patient Instructions (Signed)
Major Depressive Disorder, Adult °Major depressive disorder is a mental health condition. This disorder affects feelings. It can also affect the body. Symptoms of this condition last most of the day, almost every day, for 2 weeks. This disorder can affect: °Relationships. °Daily activities, such as work and school. °Activities that you normally like to do. °What are the causes? °The cause of this condition is not known. The disorder is likely caused by a mix of things, including: °Your personality, such as being a shy person. °Your behavior, or how you act toward others. °Your thoughts and feelings. °Too much alcohol or drugs. °How you react to stress. °Health and mental problems that you have had for a long time. °Things that hurt you in the past (trauma). °Big changes in your life, such as divorce. °What increases the risk? °The following factors may make you more likely to develop this condition: °Having family members with depression. °Being a woman. °Problems in the family. °Low levels of some brain chemicals. °Things that caused you pain as a child, especially if you lost a parent or were abused. °A lot of stress in your life, such as from: °Living without basic needs of life, such as food and shelter. °Being treated poorly because of race, sex, or religion (discrimination). °Health and mental problems that you have had for a long time. °What are the signs or symptoms? °The main symptoms of this condition are: °Being sad all the time. °Being grouchy all the time. °Loss of interest in things and activities. °Other symptoms include: °Sleeping too much or too little. °Eating too much or too little. °Gaining or losing weight, without knowing why. °Feeling tired or having low energy. °Being restless and weak. °Feeling hopeless, worthless, or guilty. °Trouble thinking clearly or making decisions. °Thoughts of hurting yourself or others, or thoughts of ending your life. °Spending a lot of time alone. °Inability to  complete common tasks of daily life. °If you have very bad MDD, you may: °Believe things that are not true. °Hear, see, taste, or feel things that are not there. °Have mild depression that lasts for at least 2 years. °Feel very sad and hopeless. °Have trouble speaking or moving. °How is this treated? °This condition may be treated with: °Talk therapy. This teaches you to know bad thoughts, feelings, and actions and how to change them. °This can also help you to communicate with others. °This can be done with members of your family. °Medicines. These can be used to treat worry (anxiety), depression, or low levels of chemicals in the brain. °Lifestyle changes. You may need to: °Limit alcohol use. °Limit drug use. °Get regular exercise. °Get plenty of sleep. °Make healthy eating choices. °Spend more time outdoors. °Brain stimulation. This treatment excites the brain. This is done when symptoms are very bad or have not gotten better with other treatments. °Follow these instructions at home: °Activity °Get regular exercise as told. °Spend time outdoors as told. °Make time to do the things you enjoy. °Find ways to deal with stress. Try to: °Meditate. °Do deep breathing. °Spend time in nature. °Keep a journal. °Return to your normal activities as told by your doctor. Ask your doctor what activities are safe for you. °Alcohol and drug use °If you drink alcohol: °Limit how much you use to: °0-1 drink a day for women. °0-2 drinks a day for men. °Be aware of how much alcohol is in your drink. In the [place] one drink equals one 12 oz bottle of beer (355 mL),   one 5 oz glass of wine (148 mL), or one 1½ oz glass of hard liquor (44 mL). °Talk to your doctor about: °Alcohol use. Alcohol can affect some medicines. °Any drug use. °General instructions ° °Take over-the-counter and prescription medicines and herbal preparations only as told by your doctor. °Eat a healthy diet. °Get a lot of sleep. °Think about joining a support group.  Your doctor may be able to suggest one. °Keep all follow-up visits as told by your doctor. This is important. °Where to find more information: °National Alliance on Mental Illness: www.nami.org °[place] National Institute of Mental Health: www.nimh.nih.gov °American Psychiatric Association: www.psychiatry.org/patients-families/ °Contact a doctor if: °Your symptoms get worse. °You get new symptoms. °Get help right away if: °You hurt yourself. °You have serious thoughts about hurting yourself or others. °You see, hear, taste, smell, or feel things that are not there. °If you ever feel like you may hurt yourself or others, or have thoughts about taking your own life, get help right away. Go to your nearest emergency department or: °Call your local emergency services (911 in the [place] °Call a suicide crisis helpline, such as the National Suicide Prevention Lifeline at 1-800-273-8255 or 988 in the [place] This is open 24 hours a day in the [place] °Text the Crisis Text Line at 741741 (in the [place] °Summary °Major depressive disorder is a mental health condition. This disorder affects feelings. Symptoms of this condition last most of the day, almost every day, for 2 weeks. °The symptoms of this disorder can cause problems with relationships and with daily activities. °There are treatments and support for people who get this disorder. You may need more than one type of treatment. °Get help right away if you have serious thoughts about hurting yourself or others. °This information is not intended to replace advice given to you by your health care provider. Make sure you discuss any questions you have with your health care provider. °Document Revised: 06/23/2021 Document Reviewed: 11/09/2019 °Elsevier Patient Education © 2022 [employer] Inc. ° °

## 2022-01-21 NOTE — Assessment & Plan Note (Signed)
It is related to chronic pain. I discussed SSRI resumption, including Cymbalta. She does not want to start this. She is ok with being referred for counseling. She agreed to be referred to [facility] for short-term intervention. I will refer her to Dr. Shawnee Knapp for counseling for depression and help with coping skills for her pain.

## 2022-01-24 ENCOUNTER — Other Ambulatory Visit: Payer: Self-pay

## 2022-01-24 ENCOUNTER — Encounter: Payer: Self-pay | Admitting: Family Medicine

## 2022-01-24 ENCOUNTER — Ambulatory Visit (INDEPENDENT_AMBULATORY_CARE_PROVIDER_SITE_OTHER): Payer: Medicare Other | Admitting: Psychology

## 2022-01-24 DIAGNOSIS — R4589 Other symptoms and signs involving emotional state: Secondary | ICD-10-CM | POA: Diagnosis not present
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
## 2022-01-25 ENCOUNTER — Other Ambulatory Visit: Payer: Self-pay | Admitting: Family Medicine

## 2022-01-25 MED ORDER — ESCITALOPRAM OXALATE 10 MG PO TABS: 10.0000 mg | ORAL_TABLET | Freq: Every day | ORAL | 1 refills | Status: DC

## 2022-01-30 DIAGNOSIS — Z1211 Encounter for screening for malignant neoplasm of colon: Secondary | ICD-10-CM | POA: Diagnosis not present

## 2022-02-03 ENCOUNTER — Other Ambulatory Visit: Payer: Self-pay

## 2022-02-03 ENCOUNTER — Emergency Department (HOSPITAL_BASED_OUTPATIENT_CLINIC_OR_DEPARTMENT_OTHER)
Admission: EM | Admit: 2022-02-03 | Discharge: 2022-02-04 | Disposition: A | Discharge status: Home / Self Care | Payer: Medicare Other | Attending: Emergency Medicine | Admitting: Emergency Medicine

## 2022-02-03 ENCOUNTER — Encounter (HOSPITAL_BASED_OUTPATIENT_CLINIC_OR_DEPARTMENT_OTHER): Payer: Self-pay | Admitting: Emergency Medicine

## 2022-02-03 DIAGNOSIS — M542 Cervicalgia: Secondary | ICD-10-CM | POA: Insufficient documentation

## 2022-02-03 DIAGNOSIS — Z91040 Latex allergy status: Secondary | ICD-10-CM | POA: Insufficient documentation

## 2022-02-03 DIAGNOSIS — M545 Low back pain, unspecified: Secondary | ICD-10-CM | POA: Diagnosis not present

## 2022-02-03 NOTE — ED Triage Notes (Signed)
Upper back pain x 5 days. Worse today after after taking a CPR class. Pt has hx of 3 surgery on back and neck. Pt takes oxycodone TID last had at 7pm tonight.

## 2022-02-04 DIAGNOSIS — M542 Cervicalgia: Secondary | ICD-10-CM | POA: Diagnosis not present

## 2022-02-04 MED ORDER — PREDNISONE 50 MG PO TABS
60.0000 mg | ORAL_TABLET | Freq: Once | ORAL | Status: AC
  Administered 2022-02-04: 60 mg via ORAL
  Filled 2022-02-04: qty 1

## 2022-02-04 MED ORDER — PREDNISONE 20 MG PO TABS: ORAL_TABLET | ORAL | 0 refills | Status: DC

## 2022-02-04 MED ORDER — KETOROLAC TROMETHAMINE 60 MG/2ML IM SOLN
60.0000 mg | Freq: Once | INTRAMUSCULAR | Status: AC
  Administered 2022-02-04: 60 mg via INTRAMUSCULAR
  Filled 2022-02-04: qty 2

## 2022-02-04 NOTE — Discharge Instructions (Addendum)
Begin taking prednisone as prescribed.  Continue taking Percocet as previously prescribed.  Follow-up with your neurosurgeon if symptoms are not improving over the next week.

## 2022-02-04 NOTE — ED Notes (Signed)
Pt verbalizes understanding of discharge instructions. Opportunity for questioning and answers were provided. Pt discharged from ED to home.   ? ?

## 2022-02-04 NOTE — ED Provider Notes (Addendum)
[facility] EMERGENCY DEPT Provider Note   CSN: [id number] Arrival date & time: 02/03/22  2221     History  Chief Complaint  Patient presents with   Back Pain    Lisa Newton is a 51 y.o. female.  Patient is a 51 year old female with past medical history of degenerative disc disease with prior neck surgery x2 and lumbar spine surgery.  Most recent surgery was an anterior cervical fusion performed by Dr. Franky Macho.  Patient presenting today with complaints of increasing pain in her neck and back.  This started 4 days ago, but significantly worsened yesterday after performing chest compressions and a CPR class.  Patient denies any radiation into the arms or legs.  She denies any weakness or numbness.  She denies any bowel or bladder complaints.  She has been taking Percocet with little relief.  The history is provided by the patient.      Home Medications Prior to Admission medications   Medication Sig Start Date End Date Taking? Authorizing Provider  atorvastatin (LIPITOR) 40 MG tablet Take 1 tablet (40 mg total) by mouth daily. 09/30/21   Doreene Eland, MD  B Complex-C (SUPER B COMPLEX/VITAMIN C PO) Take by mouth daily.    [provider]  escitalopram (LEXAPRO) 10 MG tablet Take 1 tablet (10 mg total) by mouth daily. 01/25/22   Doreene Eland, MD  hydrOXYzine (ATARAX) 25 MG tablet Take 1 tablet (25 mg total) by mouth every 6 (six) hours as needed for anxiety. Patient not taking: Reported on 01/21/2022 01/18/22   Doreene Eland, MD  levocetirizine (XYZAL) 5 MG tablet TAKE 1 TABLET BY MOUTH EVERY DAY IN THE EVENING 10/11/21   Doreene Eland, MD  levothyroxine (SYNTHROID) 75 MCG tablet Take 1 tablet (75 mcg total) by mouth daily before breakfast. 10/26/21   Doreene Eland, MD  losartan-hydrochlorothiazide (HYZAAR) 50-12.5 MG tablet Take 1 tablet by mouth daily. 09/30/21   Doreene Eland, MD  Multiple Vitamins-Minerals (HAIR/SKIN/NAILS/BIOTIN  PO) Take 2 each by mouth daily.    [provider]  oxyCODONE-acetaminophen (PERCOCET) 10-325 MG tablet Take 1 tablet by mouth 3 (three) times daily as needed. Patient not taking: Reported on 01/21/2022 04/26/21   [provider]  pregabalin (LYRICA) 75 MG capsule Take 1 capsule (75 mg total) by mouth 2 (two) times daily. 11/02/21   Doreene Eland, MD  vitamin B-12 (CYANOCOBALAMIN) 1000 MCG tablet Take 1,000 mcg by mouth daily.    [provider]  fluticasone (FLONASE) 50 MCG/ACT nasal spray Place 2 sprays into both nostrils daily. Patient not taking: Reported on 06/02/2020 04/25/19 09/21/20  Dollene Cleveland, DO  loratadine (CLARITIN) 10 MG tablet Take 1 tablet (10 mg total) by mouth daily. 05/27/20 09/21/20  Domenick Gong, MD  SUMAtriptan (IMITREX) 50 MG tablet TAKE 1 TABLET AS NEEDED FOR MIGRAINE HEADACHE. MAY REPEAT DOSE ONCE AFTER 2 HRS. DON'T TAKE MORE THAN 200 MG/DAY. 08/26/20 09/21/20  Doreene Eland, MD      Allergies    Augmentin [amoxicillin-pot clavulanate], Bactrim [sulfamethoxazole-trimethoprim], Latex, Other, Pollen extract, and Tape    Review of Systems   Review of Systems  All other systems reviewed and are negative.  Physical Exam Updated Vital Signs BP (!) 154/93    Pulse (!) 59    Temp 98.5 F (36.9 C)    Resp 18    Ht 5\' 6"  (1.676 m)    Wt 97.5 kg    LMP 01/27/2022 (Approximate)  SpO2 100%    BMI 34.70 kg/m  Physical Exam Vitals and nursing note reviewed.  Constitutional:      General: She is not in acute distress.    Appearance: She is not ill-appearing.  HENT:     Head: Normocephalic and atraumatic.  Neck:     Comments: There is tenderness to palpation in the soft tissues of the cervical region.  There is no bony tenderness or step-off. Pulmonary:     Effort: Pulmonary effort is normal.  Musculoskeletal:     Comments: There is tenderness to palpation in the soft tissues of the lumbar region.  There is no bony tenderness or  step-off.  Skin:    General: Skin is warm and dry.  Neurological:     General: No focal deficit present.     Mental Status: She is alert and oriented to person, place, and time.     Comments: Strength is 5 out of 5 in all extremities.  DTRs are 2+ and symmetrical in the patellar and Achilles tendons bilaterally.    ED Results / Procedures / Treatments   Labs (all labs ordered are listed, but only abnormal results are displayed) Labs Reviewed - No data to display  EKG None  Radiology No results found.  Procedures Procedures    Medications Ordered in ED Medications  ketorolac (TORADOL) injection 60 mg (has no administration in time range)  predniSONE (DELTASONE) tablet 60 mg (has no administration in time range)    ED Course/ Medical Decision Making/ A&P  Patient with history of prior back surgery and chronic pain presenting with an exacerbation of pain after taking a CPR class and performing chest compressions.  She has no complaints and no physical findings that would suggest an emergently surgical etiology.  Patient to be given Toradol and started on a prednisone taper.  If this does not help, she is to follow-up with her neurosurgeon to discuss the next option.  Final Clinical Impression(s) / ED Diagnoses Final diagnoses:  None    Rx / DC Orders ED Discharge Orders     None         Geoffery Lyons, MD 02/04/22 3893    Geoffery Lyons, MD 02/04/22 [phone]

## 2022-02-07 ENCOUNTER — Ambulatory Visit: Payer: Medicaid Other | Admitting: Psychology

## 2022-02-08 ENCOUNTER — Other Ambulatory Visit: Payer: Self-pay | Admitting: Family Medicine

## 2022-02-08 ENCOUNTER — Ambulatory Visit
Admission: RE | Admit: 2022-02-08 | Discharge: 2022-02-08 | Disposition: A | Discharge status: Home / Self Care | Payer: Medicare Other | Source: Ambulatory Visit | Attending: Family Medicine | Admitting: Family Medicine

## 2022-02-08 ENCOUNTER — Ambulatory Visit: Payer: Medicaid Other | Admitting: Family Medicine

## 2022-02-08 DIAGNOSIS — N6321 Unspecified lump in the left breast, upper outer quadrant: Secondary | ICD-10-CM | POA: Diagnosis not present

## 2022-02-08 DIAGNOSIS — R928 Other abnormal and inconclusive findings on diagnostic imaging of breast: Secondary | ICD-10-CM

## 2022-02-08 DIAGNOSIS — R030 Elevated blood-pressure reading, without diagnosis of hypertension: Secondary | ICD-10-CM | POA: Diagnosis not present

## 2022-02-08 DIAGNOSIS — M539 Dorsopathy, unspecified: Secondary | ICD-10-CM | POA: Diagnosis not present

## 2022-02-08 DIAGNOSIS — R195 Other fecal abnormalities: Secondary | ICD-10-CM

## 2022-02-08 DIAGNOSIS — N6310 Unspecified lump in the right breast, unspecified quadrant: Secondary | ICD-10-CM

## 2022-02-08 DIAGNOSIS — N6322 Unspecified lump in the left breast, upper inner quadrant: Secondary | ICD-10-CM | POA: Diagnosis not present

## 2022-02-08 DIAGNOSIS — Z79899 Other long term (current) drug therapy: Secondary | ICD-10-CM | POA: Diagnosis not present

## 2022-02-08 LAB — COLOGUARD: COLOGUARD: POSITIVE — AB

## 2022-02-09 ENCOUNTER — Telehealth: Payer: Self-pay | Admitting: Family Medicine

## 2022-02-09 NOTE — Telephone Encounter (Signed)
Breast US report discussed. She is aware of the result and the need for a mammogram/US in 6 months. HM updated on epic. ?+ Cologuard discussed. GI referral discussed. She agreed with the plan. ? ?Of note, she missed her appointment yesterday. Our no-show policy was discussed with her. She verbalized understanding. ?

## 2022-02-11 DIAGNOSIS — Z79899 Other long term (current) drug therapy: Secondary | ICD-10-CM | POA: Diagnosis not present

## 2022-02-17 ENCOUNTER — Encounter: Payer: Self-pay | Admitting: Family Medicine

## 2022-02-17 ENCOUNTER — Other Ambulatory Visit: Payer: Self-pay | Admitting: Family Medicine

## 2022-02-17 ENCOUNTER — Encounter: Payer: Self-pay | Admitting: Gastroenterology

## 2022-02-17 MED ORDER — IBUPROFEN 400 MG PO TABS: 400.0000 mg | ORAL_TABLET | Freq: Two times a day (BID) | ORAL | 0 refills | Status: AC | PRN

## 2022-02-22 ENCOUNTER — Ambulatory Visit (AMBULATORY_SURGERY_CENTER): Payer: Medicaid Other | Admitting: *Deleted

## 2022-02-22 ENCOUNTER — Other Ambulatory Visit: Payer: Self-pay

## 2022-02-22 VITALS — Ht 66.0 in | Wt 215.0 lb

## 2022-02-22 DIAGNOSIS — R195 Other fecal abnormalities: Secondary | ICD-10-CM

## 2022-02-22 MED ORDER — PEG 3350-KCL-NA BICARB-NACL 420 G PO SOLR: 4000.0000 mL | Freq: Once | ORAL | 0 refills | Status: AC

## 2022-02-22 NOTE — Progress Notes (Signed)
No egg or soy allergy known to patient  °No issues known to pt with past sedation with any surgeries or procedures °Patient denies ever being told they had issues or difficulty with intubation  °No FH of Malignant Hyperthermia °Pt is not on diet pills °Pt is not on  home 02  °Pt is not on blood thinners  °Pt denies issues with constipation  °No A fib or A flutter ° °Pt is not vaccinated  for Covid  ° °Due to the COVID-19 pandemic we are asking patients to follow certain guidelines in PV and the [facility]   °Pt aware of COVID protocols and [facility] guidelines  ° °PV completed over the phone. Pt verified name, DOB, address and insurance during PV today.  °Pt mailed instruction packet with copy of consent form to read and not return, and instructions.  ° °Pt encouraged to call with questions or issues.  °If pt has My chart, procedure instructions sent via My Chart  ° °

## 2022-02-28 ENCOUNTER — Other Ambulatory Visit: Payer: Self-pay | Admitting: Family Medicine

## 2022-03-01 ENCOUNTER — Ambulatory Visit: Payer: Medicaid Other | Admitting: Family Medicine

## 2022-03-05 ENCOUNTER — Other Ambulatory Visit: Payer: Self-pay | Admitting: Family Medicine

## 2022-03-05 ENCOUNTER — Encounter: Payer: Self-pay | Admitting: Family Medicine

## 2022-03-05 DIAGNOSIS — R030 Elevated blood-pressure reading, without diagnosis of hypertension: Secondary | ICD-10-CM | POA: Diagnosis not present

## 2022-03-05 DIAGNOSIS — M539 Dorsopathy, unspecified: Secondary | ICD-10-CM | POA: Diagnosis not present

## 2022-03-05 DIAGNOSIS — Z79899 Other long term (current) drug therapy: Secondary | ICD-10-CM | POA: Diagnosis not present

## 2022-03-07 ENCOUNTER — Encounter: Payer: Self-pay | Admitting: Family Medicine

## 2022-03-07 ENCOUNTER — Other Ambulatory Visit: Payer: Self-pay

## 2022-03-07 ENCOUNTER — Other Ambulatory Visit: Payer: Self-pay | Admitting: Family Medicine

## 2022-03-07 ENCOUNTER — Ambulatory Visit (INDEPENDENT_AMBULATORY_CARE_PROVIDER_SITE_OTHER): Payer: Medicare Other | Admitting: Family Medicine

## 2022-03-07 ENCOUNTER — Telehealth: Payer: Self-pay | Admitting: Gastroenterology

## 2022-03-07 VITALS — BP 172/105 | HR 85 | Ht 66.0 in | Wt 219.2 lb

## 2022-03-07 DIAGNOSIS — F419 Anxiety disorder, unspecified: Secondary | ICD-10-CM | POA: Diagnosis not present

## 2022-03-07 DIAGNOSIS — F4321 Adjustment disorder with depressed mood: Secondary | ICD-10-CM

## 2022-03-07 DIAGNOSIS — E038 Other specified hypothyroidism: Secondary | ICD-10-CM | POA: Diagnosis not present

## 2022-03-07 DIAGNOSIS — F4320 Adjustment disorder, unspecified: Secondary | ICD-10-CM

## 2022-03-07 DIAGNOSIS — F331 Major depressive disorder, recurrent, moderate: Secondary | ICD-10-CM | POA: Diagnosis not present

## 2022-03-07 HISTORY — DX: Adjustment disorder, unspecified: F43.20

## 2022-03-07 MED ORDER — LOSARTAN POTASSIUM-HCTZ 50-12.5 MG PO TABS: 1.0000 | ORAL_TABLET | Freq: Every day | ORAL | 1 refills | Status: DC

## 2022-03-07 MED ORDER — HYDROXYZINE HCL 25 MG PO TABS
25.0000 mg | ORAL_TABLET | Freq: Four times a day (QID) | ORAL | 1 refills | Status: DC | PRN
Start: 2022-03-07 — End: 2022-05-12

## 2022-03-07 MED ORDER — PREGABALIN 75 MG PO CAPS: 75.0000 mg | ORAL_CAPSULE | Freq: Two times a day (BID) | ORAL | 1 refills | Status: DC

## 2022-03-07 MED ORDER — DULOXETINE HCL 30 MG PO CPEP: 60.0000 mg | ORAL_CAPSULE | Freq: Every day | ORAL | 0 refills | Status: DC

## 2022-03-07 MED ORDER — ATORVASTATIN CALCIUM 40 MG PO TABS: 40.0000 mg | ORAL_TABLET | Freq: Every day | ORAL | 1 refills | Status: DC
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
## 2022-03-07 NOTE — Assessment & Plan Note (Signed)
TSH collected today per plan from PCP notes ?
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
## 2022-03-07 NOTE — Patient Instructions (Addendum)
Your medications have been refilled.  I am so sorry for everything that has happened, you are going through a lot and feel you are a very strong person.  The reactions you are having are very normal for the amount of things you have been through lately, but if you feel like you need further help and that you just cannot do this anymore then please make sure to go to the [facility] or call the crisis line. ? ?I will try to touch base with Dr. Shawnee Knapp to see if we can reach out or find to get some more help for you. ? ?I am sending in duloxetine 30 mg, this will take a couple of weeks to really take effect.  If you are tolerating the medication well, in the next 1 to 2 weeks you can increase to 60 mg (2 pills) if you would like.  I would like you to also touch base with Dr. Lum Babe if you decide to do that and follow-up with her.  I am sending in Atarax, which I want you to take as needed for your anxiety as well.  Hopefully this medication will also help you sleep a little bit better.  If you are still not able to sleep over the next couple of days, please contact Dr. Lum Babe to further discuss other medications. ? ?Make sure to keep an eye on your blood pressures and if you start having symptoms such as headache, dizziness, nausea/vomiting the please make sure to come back to get reevaluated. ?

## 2022-03-08 ENCOUNTER — Telehealth: Payer: Self-pay | Admitting: Psychology

## 2022-03-08 ENCOUNTER — Other Ambulatory Visit: Payer: Self-pay | Admitting: Family Medicine

## 2022-03-08 DIAGNOSIS — Z79899 Other long term (current) drug therapy: Secondary | ICD-10-CM | POA: Diagnosis not present

## 2022-03-08 LAB — TSH: TSH: 1.08 u[IU]/mL (ref 0.450–4.500)

## 2022-03-08 NOTE — Telephone Encounter (Signed)
Ok, sorry to hear that 

## 2022-03-08 NOTE — Telephone Encounter (Signed)
Called and scheduled appt for 3/29 at 9am ? ?Royetta Asal, PhD., LMFT ? ?

## 2022-03-09 ENCOUNTER — Telehealth: Payer: Self-pay | Admitting: Family Medicine

## 2022-03-09 ENCOUNTER — Encounter: Payer: Medicaid Other | Admitting: Gastroenterology

## 2022-03-09 ENCOUNTER — Ambulatory Visit (INDEPENDENT_AMBULATORY_CARE_PROVIDER_SITE_OTHER): Payer: Medicare Other | Admitting: Psychology

## 2022-03-09 ENCOUNTER — Other Ambulatory Visit: Payer: Self-pay

## 2022-03-09 DIAGNOSIS — R4589 Other symptoms and signs involving emotional state: Secondary | ICD-10-CM

## 2022-03-09 DIAGNOSIS — Z59819 Housing instability, housed unspecified: Secondary | ICD-10-CM

## 2022-03-09 DIAGNOSIS — M545 Low back pain, unspecified: Secondary | ICD-10-CM | POA: Diagnosis not present

## 2022-03-09 DIAGNOSIS — I10 Essential (primary) hypertension: Secondary | ICD-10-CM | POA: Diagnosis not present

## 2022-03-09 DIAGNOSIS — F339 Major depressive disorder, recurrent, unspecified: Secondary | ICD-10-CM

## 2022-03-09 DIAGNOSIS — Z79899 Other long term (current) drug therapy: Secondary | ICD-10-CM | POA: Diagnosis not present

## 2022-03-09 DIAGNOSIS — R030 Elevated blood-pressure reading, without diagnosis of hypertension: Secondary | ICD-10-CM | POA: Diagnosis not present

## 2022-03-09 NOTE — Addendum Note (Signed)
Addended by: Janit Pagan T on: 03/09/2022 09:37 AM ? ? Modules accepted: Orders ? ?
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
## 2022-03-11 ENCOUNTER — Telehealth: Payer: Self-pay
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
## 2022-03-14 ENCOUNTER — Other Ambulatory Visit: Payer: Self-pay | Admitting: Family Medicine

## 2022-03-14 DIAGNOSIS — G4700 Insomnia, unspecified: Secondary | ICD-10-CM

## 2022-03-14 DIAGNOSIS — F339 Major depressive disorder, recurrent, unspecified: Secondary | ICD-10-CM

## 2022-03-14 DIAGNOSIS — F419 Anxiety disorder, unspecified: Secondary | ICD-10-CM

## 2022-03-15 ENCOUNTER — Telehealth: Payer: Self-pay | Admitting: *Deleted

## 2022-03-15 ENCOUNTER — Encounter: Payer: Self-pay | Admitting: Family Medicine

## 2022-03-15 ENCOUNTER — Ambulatory Visit (INDEPENDENT_AMBULATORY_CARE_PROVIDER_SITE_OTHER): Payer: Medicare Other | Admitting: Psychology

## 2022-03-15 ENCOUNTER — Ambulatory Visit (INDEPENDENT_AMBULATORY_CARE_PROVIDER_SITE_OTHER): Payer: Medicare Other | Admitting: Family Medicine

## 2022-03-15 VITALS — BP 134/86 | HR 74 | Ht 66.0 in | Wt 219.6 lb

## 2022-03-15 DIAGNOSIS — R7309 Other abnormal glucose: Secondary | ICD-10-CM | POA: Diagnosis not present

## 2022-03-15 DIAGNOSIS — M47817 Spondylosis without myelopathy or radiculopathy, lumbosacral region: Secondary | ICD-10-CM

## 2022-03-15 DIAGNOSIS — I10 Essential (primary) hypertension: Secondary | ICD-10-CM | POA: Diagnosis not present

## 2022-03-15 DIAGNOSIS — Z79899 Other long term (current) drug therapy: Secondary | ICD-10-CM | POA: Diagnosis not present

## 2022-03-15 DIAGNOSIS — F331 Major depressive disorder, recurrent, moderate: Secondary | ICD-10-CM | POA: Diagnosis not present

## 2022-03-15 DIAGNOSIS — R7303 Prediabetes: Secondary | ICD-10-CM

## 2022-03-15 DIAGNOSIS — R4589 Other symptoms and signs involving emotional state: Secondary | ICD-10-CM

## 2022-03-15 DIAGNOSIS — M545 Low back pain, unspecified: Secondary | ICD-10-CM | POA: Diagnosis not present

## 2022-03-15 LAB — POCT GLYCOSYLATED HEMOGLOBIN (HGB A1C): Hemoglobin A1C: 5.8 % — AB (ref 4.0–5.6)

## 2022-03-15 NOTE — Assessment & Plan Note (Signed)
Chronic recurrent. ?Improving on Cymbalta 60 mg QD. ?Also on Hydroxyzine 25 mg Q6 prn for anxiety. ?I advised her she can take up to 50 mg at nighttime to help with her insomnia. ?She has counseling appointment with Dr. Shawnee Knapp today. ?I will see her again in 4 weeks or sooner if needed. ?

## 2022-03-15 NOTE — Assessment & Plan Note (Signed)
Stable on current regimen. ?Cmet checked. ?I will contact her soon with her results.  ?

## 2022-03-15 NOTE — Progress Notes (Addendum)
? ? ?  SUBJECTIVE:  ? ?CHIEF COMPLAINT / HPI:  ? ?HPI ? ?HTN: ?She is compliant with her BP meds. ?She would like to check her kidneys and liver with all her medications--otherwise, no new concerns.  She also requested A1C check. ? ?Anxiety/Depression: ?She is compliant with her Cymbalta and Hydroxyzine as instructed. However, she has difficulty sleeping at night. She continues to stay with her daughter, who provides her with adequate support.  ? ?Back pain:  ?Unchanged from baseline but persists. No recent falls or trauma to her back. Her oxy helps just a little. She has f/u with Dr. Arneta Cliche next Tuesday. ? ?PERTINENT  PMH / PSH: PMHx reviewed ? ?OBJECTIVE:  ? ?BP 134/86   Pulse 74   Ht 5\' 6"  (1.676 m)   Wt 219 lb 9.6 oz (99.6 kg)   LMP 02/27/2022   SpO2 100%   BMI 35.44 kg/m?   ?Physical Exam ?Vitals reviewed.  ?Cardiovascular:  ?   Rate and Rhythm: Normal rate and regular rhythm.  ?   Heart sounds: Normal heart sounds. No murmur heard. ?Pulmonary:  ?   Effort: Pulmonary effort is normal. No respiratory distress.  ?   Breath sounds: Normal breath sounds. No wheezing.  ?Musculoskeletal:  ?   Lumbar back: Normal.  ?Neurological:  ?   General: No focal deficit present.  ?Psychiatric:     ?   Attention and Perception: Attention normal.     ?   Mood and Affect: Mood is depressed.     ?   Speech: Speech normal.     ?   Thought Content: Thought content does not include homicidal or suicidal ideation. Thought content does not include homicidal or suicidal plan.  ? ? ? ?ASSESSMENT/PLAN:  ? ?Essential hypertension ?Stable on current regimen. ?Cmet checked. ?I will contact her soon with her results.  ? ?MDD (major depressive disorder) ?Chronic recurrent. ?Improving on Cymbalta 60 mg QD. ?Also on Hydroxyzine 25 mg Q6 prn for anxiety. ?I advised her she can take up to 50 mg at nighttime to help with her insomnia. ?She has counseling appointment with Dr. Hartford Poli today. ?I will see her again in 4 weeks or sooner if  needed. ? ?Spondylosis without myelopathy or radiculopathy, lumbosacral region ?Persistent pain. ?Neuro exam benign. ?Continue Oxy per pain clinic. ?F/U neurosurg as planned.  ?  ?PreDM: ?I called and discussed A1C result with her. ?Diet and exercise counseling provided. ?Consider Metformin in the future. ?She agreed with the plan.  ? ?Andrena Mews, MD ?[place]  ? ?

## 2022-03-15 NOTE — Patient Instructions (Signed)
It was nice seeing you today. I am glad you are taking your anxiety and depression medication. For sleep at nighttime, you can take p to 50 mg of Hydroxyzine as needed for sleep. Otherwise, continue Hydroxyzine as prescribed.  ?Managing Anxiety, Adult ?After being diagnosed with anxiety, you may be relieved to know why you have felt or behaved a certain way. You may also feel overwhelmed about the treatment ahead and what it will mean for your life. With care and support, you can manage this condition. ?How to manage lifestyle changes ?Managing stress and anxiety ?Stress is your body's reaction to life changes and events, both good and bad. Most stress will last just a few hours, but stress can be ongoing and can lead to more than just stress. Although stress can play a major role in anxiety, it is not the same as anxiety. Stress is usually caused by something external, such as a deadline, test, or competition. Stress normally passes after the triggering event has ended.  ?Anxiety is caused by something internal, such as imagining a terrible outcome or worrying that something will go wrong that will devastate you. Anxiety often does not go away even after the triggering event is over, and it can become long-term (chronic) worry. It is important to understand the differences between stress and anxiety and to manage your stress effectively so that it does not lead to an anxious response. ?Talk with your health care provider or a counselor to learn more about reducing anxiety and stress. He or she may suggest tension reduction techniques, such as: ?Music therapy. Spend time creating or listening to music that you enjoy and that inspires you. ?Mindfulness-based meditation. Practice being aware of your normal breaths while not trying to control your breathing. It can be done while sitting or walking. ?Centering prayer. This involves focusing on a word, phrase, or sacred image that means something to you and brings you  peace. ?Deep breathing. To do this, expand your stomach and inhale slowly through your nose. Hold your breath for 3-5 seconds. Then exhale slowly, letting your stomach muscles relax. ?Self-talk. Learn to notice and identify thought patterns that lead to anxiety reactions and change those patterns to thoughts that feel peaceful. ?Muscle relaxation. Taking time to tense muscles and then relax them. ?Choose a tension reduction technique that fits your lifestyle and personality. These techniques take time and practice. Set aside 5-15 minutes a day to do them. Therapists can offer counseling and training in these techniques. The training to help with anxiety may be covered by some insurance plans. ?Other things you can do to manage stress and anxiety include: ?Keeping a stress diary. This can help you learn what triggers your reaction and then learn ways to manage your response. ?Thinking about how you react to certain situations. You may not be able to control everything, but you can control your response. ?Making time for activities that help you relax and not feeling guilty about spending your time in this way. ?Doing visual imagery. This involves imagining or creating mental pictures to help you relax. ?Practicing yoga. Through yoga poses, you can lower tension and promote relaxation. ? ?Medicines ?Medicines can help ease symptoms. Medicines for anxiety include: ?Antidepressant medicines. These are usually prescribed for long-term daily control. ?Anti-anxiety medicines. These may be added in severe cases, especially when panic attacks occur. ?Medicines will be prescribed by a health care provider. When used together, medicines, psychotherapy, and tension reduction techniques may be the most effective treatment. ?Relationships ?  Relationships can play a big part in helping you recover. Try to spend more time connecting with trusted friends and family members. ?Consider going to couples counseling if you have a partner,  taking family education classes, or going to family therapy. ?Therapy can help you and others better understand your condition. ?How to recognize changes in your anxiety ?Everyone responds differently to treatment for anxiety. Recovery from anxiety happens when symptoms decrease and stop interfering with your daily activities at home or work. This may mean that you will start to: ?Have better concentration and focus. Worry will interfere less in your daily thinking. ?Sleep better. ?Be less irritable. ?Have more energy. ?Have improved memory. ?It is also important to recognize when your condition is getting worse. Contact your health care provider if your symptoms interfere with home or work and you feel like your condition is not improving. ?Follow these instructions at home: ?Activity ?Exercise. Adults should do the following: ?Exercise for at least 150 minutes each week. The exercise should increase your heart rate and make you sweat (moderate-intensity exercise). ?Strengthening exercises at least twice a week. ?Get the right amount and quality of sleep. Most adults need 7-9 hours of sleep each night. ?Lifestyle ? ?Eat a healthy diet that includes plenty of vegetables, fruits, whole grains, low-fat dairy products, and lean protein. ?Do not eat a lot of foods that are high in fats, added sugars, or salt (sodium). ?Make choices that simplify your life. ?Do not use any products that contain nicotine or tobacco. These products include cigarettes, chewing tobacco, and vaping devices, such as e-cigarettes. If you need help quitting, ask your health care provider. ?Avoid caffeine, alcohol, and certain over-the-counter cold medicines. These may make you feel worse. Ask your pharmacist which medicines to avoid. ?General instructions ?Take over-the-counter and prescription medicines only as told by your health care provider. ?Keep all follow-up visits. This is important. ?Where to find support ?You can get help and support  from these sources: ?Self-help groups. ?Online and [employer]. ?A trusted spiritual leader. ?Couples counseling. ?Family education classes. ?Family therapy. ?Where to find more information ?You may find that joining a support group helps you deal with your anxiety. The following sources can help you locate counselors or support groups near you: ?Mental Health America: www.mentalhealthamerica.net ?Anxiety and Depression Association of America (ADAA): ProgramCam.de ?National Alliance on Mental Illness (NAMI): www.nami.org ?Contact a health care provider if: ?You have a hard time staying focused or finishing daily tasks. ?You spend many hours a day feeling worried about everyday life. ?You become exhausted by worry. ?You start to have headaches or frequently feel tense. ?You develop chronic nausea or diarrhea. ?Get help right away if: ?You have a racing heart and shortness of breath. ?You have thoughts of hurting yourself or others. ?If you ever feel like you may hurt yourself or others, or have thoughts about taking your own life, get help right away. Go to your nearest emergency department or: ?Call your local emergency services (911 in the [place] ?Call a suicide crisis helpline, such as the National Suicide Prevention Lifeline at 614-396-6636 or 988 in the [place] This is open 24 hours a day in the [place] ?Text the Crisis Text Line at (249)794-1133 (in the [place] ?Summary ?Taking steps to learn and use tension reduction techniques can help calm you and help prevent triggering an anxiety reaction. ?When used together, medicines, psychotherapy, and tension reduction techniques may be the most effective treatment. ?Family, friends, and partners can play a big part  in supporting you. ?This information is not intended to replace advice given to you by your health care provider. Make sure you discuss any questions you have with your health care provider. ?Document Revised: 06/23/2021 Document Reviewed:  03/21/2021 ?Elsevier Patient Education ? 2022 Elsevier Inc. ? ?

## 2022-03-15 NOTE — Chronic Care Management (AMB) (Signed)
?  Care Management  ? ?Outreach Note ? ?03/15/2022 ?Name: Lisa Newton MRN: [id number] DOB: January 08, 1971 ? ?Referred by: Doreene Eland, MD ?Reason for referral : Care Coordination (Initial outreach to schedule referral with SW) ? ? ?An unsuccessful telephone outreach was attempted today. The patient was referred to the case management team for assistance with care management and care coordination.  ? ?Follow Up Plan:  ?The care management team will reach out to the patient again over the next 2 days.  ?If patient returns call to provider office, please advise to call Embedded Care Management Care Guide Misty Stanley* at [phone].* ? ?Lisa Newton  ?Care Guide, Embedded Care Coordination ?[facility] Management  ?Direct Dial: [phone] ? ?

## 2022-03-15 NOTE — Assessment & Plan Note (Signed)
I called and discussed A1C result with her. ?Diet and exercise counseling provided. ?Consider Metformin in the future. ?She agreed with the plan.  ?

## 2022-03-15 NOTE — Assessment & Plan Note (Signed)
Persistent pain. ?Neuro exam benign. ?Continue Oxy per pain clinic. ?F/U neurosurg as planned.  ?

## 2022-03-16 ENCOUNTER — Encounter: Payer: Self-pay | Admitting: Family Medicine

## 2022-03-16 LAB — CMP14+EGFR
ALT: 21 IU/L (ref 0–32)
AST: 20 IU/L (ref 0–40)
Albumin/Globulin Ratio: 1.8 (ref 1.2–2.2)
Albumin: 4.6 g/dL (ref 3.8–4.8)
Alkaline Phosphatase: 85 IU/L (ref 44–121)
BUN/Creatinine Ratio: 12 (ref 9–23)
BUN: 10 mg/dL (ref 6–24)
Bilirubin Total: 0.7 mg/dL (ref 0.0–1.2)
CO2: 24 mmol/L (ref 20–29)
Calcium: 10.3 mg/dL — ABNORMAL HIGH (ref 8.7–10.2)
Chloride: 99 mmol/L (ref 96–106)
Creatinine, Ser: 0.86 mg/dL (ref 0.57–1.00)
Globulin, Total: 2.6 g/dL (ref 1.5–4.5)
Glucose: 94 mg/dL (ref 70–99)
Potassium: 4.1 mmol/L (ref 3.5–5.2)
Sodium: 138 mmol/L (ref 134–144)
Total Protein: 7.2 g/dL (ref 6.0–8.5)
eGFR: 82 mL/min/{1.73_m2} (ref >59)
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
## 2022-03-17 NOTE — Chronic Care Management (AMB) (Signed)
?  Care Management  ? ?Note ? ?03/17/2022 ?Name: Lisa Newton MRN: [id number] DOB: August 13, 1971 ? ?Lisa Newton is a 51 y.o. year old female who is a primary care patient of Eniola, Phill Myron, MD. I reached out to [employer] by phone today offer care coordination services.  ? ?[name] was given information about care management services today including:  ?Care management services include personalized support from designated clinical staff supervised by her physician, including individualized plan of care and coordination with other care providers ?24/7 contact phone numbers for assistance for urgent and routine care needs. ?The patient may stop care management services at any time by phone call to the office staff. ? ?Patient agreed to services and verbal consent obtained.  ? ?Follow up plan: ?Telephone appointment with care management team member scheduled for:03/23/22 ? ?Laverda Sorenson  ?Care Guide, Embedded Care Coordination ?[facility]  Care Management  ?Direct Dial: [phone] ? ?

## 2022-03-22 DIAGNOSIS — M545 Low back pain, unspecified: Secondary | ICD-10-CM | POA: Diagnosis not present

## 2022-03-23 ENCOUNTER — Telehealth: Payer: Medicare Other

## 2022-03-23 ENCOUNTER — Telehealth: Payer: Self-pay | Admitting: Licensed Clinical Social Worker

## 2022-03-23 NOTE — Telephone Encounter (Signed)
?  Care Management  ? ?Follow Up Note ? ? ?03/23/2022 ?Name: Lisa Newton MRN: [id number] DOB: [birth year] ? ? ?Referred by: Doreene Eland, MD ?Reason for referral : No chief complaint on file. ? ? ?An unsuccessful telephone outreach was attempted today. The patient was referred to the case management team for assistance with care management and care coordination.  ? ?Child psychotherapist attempted twice on today. Phone continues to ring. SW unable to leave VM. ? ?Follow Up Plan: The care management team will reach out to the patient again over the next 30 days.  ? ?Christen Butter, BSW  ?Social Worker ?[facility]  ?[phone] ?  ?

## 2022-03-24 ENCOUNTER — Ambulatory Visit (INDEPENDENT_AMBULATORY_CARE_PROVIDER_SITE_OTHER): Payer: Medicare Other | Admitting: Specialist

## 2022-03-24 ENCOUNTER — Encounter: Payer: Self-pay | Admitting: Specialist

## 2022-03-24 ENCOUNTER — Ambulatory Visit: Payer: Self-pay

## 2022-03-24 VITALS — BP 141/94 | HR 74 | Ht 66.0 in | Wt 215.0 lb

## 2022-03-24 DIAGNOSIS — M4802 Spinal stenosis, cervical region: Secondary | ICD-10-CM

## 2022-03-24 DIAGNOSIS — M4012 Other secondary kyphosis, cervical region: Secondary | ICD-10-CM

## 2022-03-24 DIAGNOSIS — M4722 Other spondylosis with radiculopathy, cervical region: Secondary | ICD-10-CM

## 2022-03-24 DIAGNOSIS — M4812 Ankylosing hyperostosis [Forestier], cervical region: Secondary | ICD-10-CM

## 2022-03-24 DIAGNOSIS — M4322 Fusion of spine, cervical region: Secondary | ICD-10-CM | POA: Diagnosis not present

## 2022-03-24 DIAGNOSIS — M542 Cervicalgia: Secondary | ICD-10-CM

## 2022-03-24 MED ORDER — MELOXICAM 15 MG PO TABS: 15.0000 mg | ORAL_TABLET | Freq: Every day | ORAL | 2 refills | Status: DC

## 2022-03-24 MED ORDER — TIZANIDINE HCL 2 MG PO CAPS
2.0000 mg | ORAL_CAPSULE | Freq: Three times a day (TID) | ORAL | 3 refills | Status: DC | PRN
Start: 2022-03-24 — End: 2022-09-06

## 2022-03-24 MED ORDER — GABAPENTIN 300 MG PO CAPS: ORAL_CAPSULE | ORAL | 0 refills | Status: DC
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
## 2022-03-25 ENCOUNTER — Ambulatory Visit (INDEPENDENT_AMBULATORY_CARE_PROVIDER_SITE_OTHER): Payer: Medicare Other | Admitting: Psychology

## 2022-03-25 ENCOUNTER — Ambulatory Visit: Payer: Self-pay | Admitting: Licensed Clinical Social Worker

## 2022-03-25 DIAGNOSIS — R4589 Other symptoms and signs involving emotional state: Secondary | ICD-10-CM
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
## 2022-03-25 NOTE — Chronic Care Management (AMB) (Signed)
?  Care Management  ? ?Social Work Visit Note ? ?03/25/2022 ?Name: Lisa Newton MRN: [id number] DOB: [birth year] ? ?Lisa Newton is a 51 y.o. year old female who sees Lum Babe, Theador Hawthorne, MD for primary care. The care management team was consulted for assistance with care management and care coordination needs related to [facility] Resources   ? ?Patient was given the following information about care management and care coordination services today, agreed to services, and gave verbal consent: 1.care management/care coordination services include personalized support from designated clinical staff supervised by their physician, including individualized plan of care and coordination with other care providers 2. 24/7 contact phone numbers for assistance for urgent and routine care needs. 3. The patient may stop care management/care coordination services at any time by phone call to the office staff. ? ?Engaged with patient by telephone for initial visit in response to provider referral for social work chronic care management and care coordination services. ? ?Assessment: Review of patient history, allergies, and health status during evaluation of patient need for care management/care coordination services.   ? ?Interventions:  ?Patient interviewed and appropriate assessments performed ?Collaborated with clinical team regarding patient needs  ?Patient in need of housing. Patient is currently living in a hotel paid by [employer]. Patient complains of bed bugs. SW advised patient to notify [employer] and request a change of hotels.  ?Patient has an adult daughter with a 2 bed room home. Patient advised her home is an option to temporary stay.  ?SW emailed information for [facility]. Patient is on waiting list for Housing authority. ?Patient has an income of $39 from SSD.  ? ?SDOH (Social Determinants of Health) assessments performed: Yes ?   ? ?Plan:  ?patient will work with BSW to address needs  related to Housing. ?SW will follow up in 7-days. ? ?Christen Butter, BSW  ?Social Worker ?[facility] Care Management  ?[phone] ?  ? ? ? ? ? ? ? ? ? ? ? ? ? ? ?

## 2022-03-29 ENCOUNTER — Ambulatory Visit (INDEPENDENT_AMBULATORY_CARE_PROVIDER_SITE_OTHER): Payer: Medicare Other | Admitting: Psychology

## 2022-03-29 DIAGNOSIS — R4589 Other symptoms and signs involving emotional state: Secondary | ICD-10-CM | POA: Diagnosis not present
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
## 2022-03-30 ENCOUNTER — Other Ambulatory Visit: Payer: Self-pay | Admitting: Family Medicine

## 2022-03-30 DIAGNOSIS — H905 Unspecified sensorineural hearing loss: Secondary | ICD-10-CM | POA: Diagnosis not present

## 2022-03-30 MED ORDER — DULOXETINE HCL 30 MG PO CPEP: 30.0000 mg | ORAL_CAPSULE | Freq: Two times a day (BID) | ORAL | 1 refills | Status: DC

## 2022-04-08 NOTE — Patient Instructions (Signed)
Visit Information ? ?Instructions: patient will work with SW to address concerns related to housing. ? ?Patient was given the following information about care management and care coordination services today, agreed to services, and gave verbal consent: 1.care management/care coordination services include personalized support from designated clinical staff supervised by their physician, including individualized plan of care and coordination with other care providers 2. 24/7 contact phone numbers for assistance for urgent and routine care needs. 3. The patient may stop care management/care coordination services at any time by phone call to the office staff. ? ?Patient verbalizes understanding of instructions and care plan provided today and agrees to view in MyChart. Active MyChart status confirmed with patient.   ? ?The care management team will reach out to the patient again over the next 7 days.  ? ?Christen Butter, BSW,MSW  ?Social Worker ?[facility]  ?[phone] ?  ? ?  ?

## 2022-04-12 ENCOUNTER — Telehealth: Payer: Medicare Other

## 2022-04-12 DIAGNOSIS — M545 Low back pain, unspecified: Secondary | ICD-10-CM | POA: Diagnosis not present

## 2022-04-12 DIAGNOSIS — Z79899 Other long term (current) drug therapy: Secondary | ICD-10-CM | POA: Diagnosis not present

## 2022-04-14 ENCOUNTER — Ambulatory Visit (INDEPENDENT_AMBULATORY_CARE_PROVIDER_SITE_OTHER): Payer: Medicare Other | Admitting: Specialist

## 2022-04-14 ENCOUNTER — Encounter: Payer: Self-pay | Admitting: Specialist

## 2022-04-14 VITALS — BP 142/99 | HR 73 | Ht 66.0 in | Wt 215.0 lb

## 2022-04-14 DIAGNOSIS — L910 Hypertrophic scar: Secondary | ICD-10-CM | POA: Diagnosis not present

## 2022-04-14 DIAGNOSIS — M4322 Fusion of spine, cervical region: Secondary | ICD-10-CM | POA: Diagnosis not present

## 2022-04-14 DIAGNOSIS — M4802 Spinal stenosis, cervical region: Secondary | ICD-10-CM

## 2022-04-14 DIAGNOSIS — M4722 Other spondylosis with radiculopathy, cervical region: Secondary | ICD-10-CM | POA: Diagnosis not present

## 2022-04-14 DIAGNOSIS — M542 Cervicalgia: Secondary | ICD-10-CM

## 2022-04-14 DIAGNOSIS — M4812 Ankylosing hyperostosis [Forestier], cervical region: Secondary | ICD-10-CM | POA: Diagnosis not present

## 2022-04-14 DIAGNOSIS — M4012 Other secondary kyphosis, cervical region: Secondary | ICD-10-CM | POA: Diagnosis not present
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
## 2022-04-15 ENCOUNTER — Ambulatory Visit (INDEPENDENT_AMBULATORY_CARE_PROVIDER_SITE_OTHER): Payer: Medicare Other | Admitting: Psychology

## 2022-04-15 DIAGNOSIS — R4589 Other symptoms and signs involving emotional state: Secondary | ICD-10-CM
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
## 2022-04-17 ENCOUNTER — Encounter: Payer: Self-pay | Admitting: Gastroenterology

## 2022-04-18 ENCOUNTER — Other Ambulatory Visit: Payer: Self-pay

## 2022-04-18 MED ORDER — GOLYTELY 236 G PO SOLR: ORAL | 0 refills | Status: DC

## 2022-04-19 ENCOUNTER — Telehealth: Payer: Self-pay | Admitting: Gastroenterology

## 2022-04-19 ENCOUNTER — Ambulatory Visit: Payer: Medicare Other | Admitting: Licensed Clinical Social Worker

## 2022-04-19 NOTE — Telephone Encounter (Signed)
Pharmacy rep calling to advise they don't have Golytely they only have [id number]. Please call them to let them know if that change will be ok for the patient ?

## 2022-04-19 NOTE — Telephone Encounter (Signed)
Spoke with pharmacy. Ok to use PEG 3350. ?

## 2022-04-20 ENCOUNTER — Encounter: Payer: Self-pay | Admitting: Gastroenterology

## 2022-04-20 ENCOUNTER — Ambulatory Visit (AMBULATORY_SURGERY_CENTER): Payer: Medicare Other | Admitting: Gastroenterology

## 2022-04-20 VITALS — BP 138/79 | HR 78 | Temp 98.2°F | Resp 12 | Ht 66.0 in | Wt 215.0 lb

## 2022-04-20 DIAGNOSIS — Z538 Procedure and treatment not carried out for other reasons: Secondary | ICD-10-CM | POA: Diagnosis not present

## 2022-04-20 DIAGNOSIS — Z1211 Encounter for screening for malignant neoplasm of colon: Secondary | ICD-10-CM | POA: Diagnosis not present

## 2022-04-20 DIAGNOSIS — R195 Other fecal abnormalities: Secondary | ICD-10-CM | POA: Diagnosis not present

## 2022-04-20 DIAGNOSIS — F32A Depression, unspecified: Secondary | ICD-10-CM | POA: Diagnosis not present

## 2022-04-20 MED ORDER — SODIUM CHLORIDE 0.9 % IV SOLN: 500.0000 mL | Freq: Once | INTRAVENOUS | Status: DC

## 2022-04-20 NOTE — Op Note (Signed)
[facility] ?Patient Name: Lisa Newton ?Procedure Date: 04/20/2022 3:04 PM ?MRN: [id number] ?Endoscopist: Napoleon Form , MD ?Age: 51 ?Referring MD:  ?Date of Birth: 07/26/1971 ?Gender: Female ?Account #: [id number] ?Procedure:                Colonoscopy ?Indications:              Positive Cologuard test ?Medicines:                Monitored Anesthesia Care ?Procedure:                Pre-Anesthesia Assessment: ?                          - Prior to the procedure, a History and Physical  ?                          was performed, and patient medications and  ?                          allergies were reviewed. The patient's tolerance of  ?                          previous anesthesia was also reviewed. The risks  ?                          and benefits of the procedure and the sedation  ?                          options and risks were discussed with the patient.  ?                          All questions were answered, and informed consent  ?                          was obtained. Prior Anticoagulants: The patient has  ?                          taken no previous anticoagulant or antiplatelet  ?                          agents. ASA Grade Assessment: II - A patient with  ?                          mild systemic disease. After reviewing the risks  ?                          and benefits, the patient was deemed in  ?                          satisfactory condition to undergo the procedure. ?                          After obtaining informed consent, the colonoscope  ?  was passed under direct vision. Throughout the  ?                          procedure, the patient's blood pressure, pulse, and  ?                          oxygen saturations were monitored continuously. The  ?                          Olympus [id number]-[id number]) Colonoscope was  ?                          introduced through the anus with the intention of  ?                          advancing to the cecum. The  scope was advanced to  ?                          the rectum before the procedure was aborted.  ?                          Medications were given. The colonoscopy was  ?                          performed without difficulty. The patient tolerated  ?                          the procedure well. The quality of the bowel  ?                          preparation was poor. The rectum was photographed. ?Scope In: 3:22:59 PM ?Scope Out: 3:23:25 PM ?Total Procedure Duration: 0 hours 0 minutes 26 seconds  ?Findings:                 The perianal and digital rectal examinations were  ?                          normal. ?                          A moderate amount of semi-solid stool was found in  ?                          the rectum, precluding visualization. ?Complications:            No immediate complications. ?Estimated Blood Loss:     Estimated blood loss: none. ?Impression:               - Preparation of the colon was poor. ?                          - Stool in the rectum. ?                          - No specimens collected. ?Recommendation:           -  Patient has a contact number available for  ?                          emergencies. The signs and symptoms of potential  ?                          delayed complications were discussed with the  ?                          patient. Return to normal activities tomorrow.  ?                          Written discharge instructions were provided to the  ?                          patient. ?                          - Resume previous diet. ?                          - Continue present medications. ?                          - Repeat colonoscopy at the next available  ?                          appointment because the bowel preparation was  ?                          suboptimal. ?                          - For future colonoscopy the patient will require  ?                          an extended preparation. If there are any  ?                          questions, please contact the  gastroenterologist. ?Napoleon Form, MD ?04/20/2022 3:26:54 PM ?This report has been signed electronically. ?

## 2022-04-20 NOTE — Chronic Care Management (AMB) (Signed)
  Care Management   Social Work Visit Note  04/19/2022 Name: Lisa Newton MRN: [id number] DOB: 07/09/1971  Lisa Newton is a 51 y.o. year old female who sees Doreene Eland, MD for primary care. The care management team was consulted for assistance with care management and care coordination needs related to  Housing.    Patient was given the following information about care management and care coordination services today, agreed to services, and gave verbal consent: 1.care management/care coordination services include personalized support from designated clinical staff supervised by their physician, including individualized plan of care and coordination with other care providers 2. 24/7 contact phone numbers for assistance for urgent and routine care needs. 3. The patient may stop care management/care coordination services at any time by phone call to the office staff.  Engaged with patient by telephone for follow up visit in response to provider referral for social work chronic care management and care coordination services.  Assessment: Review of patient history, allergies, and health status during evaluation of patient need for care management/care coordination services.    Interventions:  Patient interviewed and appropriate assessments performed Collaborated with clinical team regarding patient needs  Patient was able to move hotel rooms. [employer] is continuing to pay for her hotel until she finds housing. [employer] coalition has agreed to assist with moving expenses. Patient will contact housing authority for any updates.        Plan:  No additional follow up needed at this time.   Ander Gaster , MSW Social Worker [facility] Care Management  [phone]

## 2022-04-20 NOTE — Progress Notes (Signed)
[facility] History and Physical ? ? ?Primary Care Physician:  Doreene Eland, MD ? ? ?Reason for Procedure:  Positive cologuard  ? ?Plan:    colonoscopy with possible interventions as needed ? ? ? ? ?HPI: Lisa Newton is a very pleasant 51 y.o. female here for colonoscopy. ? ? ?The risks and benefits as well as alternatives of endoscopic procedure(s) have been discussed and reviewed. All questions answered. The patient agrees to proceed. ? ? ? ?Past Medical History:  ?Diagnosis Date  ? Abnormal MRI, cervical spine (07/25/2018) 09/03/2018  ? Disc levels: C2-3: The disc appears normal. Stable mild bilateral facet hypertrophy. No spinal stenosis or nerve root encroachment. C3-4: Stable mild uncinate spurring and facet hypertrophy. No spinal stenosis or nerve root encroachment. C4-5: Stable chronic spondylosis with posterior osteophytes covering diffusely bulging disc material. The AP diameter of the canal is chronically narrowed to 8 mm  ? Abnormal MRI, lumbar spine (07/25/2018) 09/03/2018  ? Disc levels: L4-5: The patient is status post right laminotomy for discectomy. There is some loss of disc space height. Fat about the descending right L4 root is obliterated in the subarticular recess. This is likely due to the presence of granulation tissue. No definite mass effect on the root is seen and there is no mass effect on the thecal sac. Foramina are open. L5-S1: Minimal disc bulge and   ? Acne 03/02/2018  ? Allergy   ? Amenorrhea 11/30/2018  ? Anxiety   ? Arthritis   ? Cervical radiculitis (Left) 12/22/2016  ? Chronic headaches   ? DDD (degenerative disc disease), cervical 08/06/2018  ? DDD (degenerative disc disease), lumbar 08/06/2018  ? Depression 01/11/2012  ? Depression with anxiety 10/30/2015  ? Disorder of skeletal system 07/18/2018  ? 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 Herniation of cervical intervertebral disc with radiculopathy 03/14/2017  ? Hyperlipidemia   ? Hypertension   ?  Hypothyroidism   ? Issue of medical certificate for disability examination 10/25/2013  ? Left ear hearing loss   ? Lumbar radiculitis (Bilateral) 08/06/2018  ? Neck pain   ? 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  ? Parotitis, acute 06/11/2018  ? PONV (postoperative nausea and vomiting)   ? Poor social situation 09/13/2017  ? 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/2018  ? 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  ? 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 ? ?Past Surgical History:  ?Procedure Laterality Date  ? ANTERIOR CERVICAL DECOMP/DISCECTOMY FUSION N/A 05/05/2021  ? Procedure: Anterior Cervical Decompression Fusion  - Cervical six-Cervical seven;  Surgeon: Coletta Memos, MD;  Location: [facility] OR;  Service: Neurosurgery;  Laterality: N/A;  ? cholesterol granuloma    ? left ear  ? left ear surgery    ? ruptured TM  ? LUMBAR LAMINECTOMY/DECOMPRESSION MICRODISCECTOMY Right 12/14/2017  ? Procedure: MICRODISCECTOMY LUMBAR FOUR- LUMBAR FIVE - RIGHT;  Surgeon: Coletta Memos, MD;  Location: [facility] OR;  Service: Neurosurgery;  Laterality: Right;  MICRODISCECTOMY LUMBAR 4- LUMBAR 5 - RIGHT  ? POSTERIOR CERVICAL FUSION/FORAMINOTOMY N/A 03/14/2017  ? Procedure: LEFT C6-7 FORAMINOTOMY WITH EXCISION OF HERNIATED NUCLEUS PULPOSUS;  Surgeon: Kerrin Champagne, MD;  Location: [facility] OR;  Service: Orthopedics;  Laterality: N/A;  ? ? ?Prior to Admission medications   ?Medication Sig Start Date End Date Taking? Authorizing Provider  ?atorvastatin (LIPITOR) 40 MG tablet Take 1 tablet (40 mg total) by mouth daily. 03/07/22  Yes Doreene Eland, MD  ?B Complex-C (SUPER B COMPLEX/VITAMIN C PO) Take by mouth daily.  Yes [provider]  ?DULoxetine (CYMBALTA) 30 MG capsule Take 1 capsule (30 mg total) by mouth 2 (two) times daily. 03/30/22  Yes Doreene Eland, MD  ?gabapentin (NEURONTIN) 300 MG capsule Take 1 capsule (300 mg total) by mouth at bedtime for 3 days, THEN 1 capsule (300 mg total) 2 (two) times daily  for 3 days, THEN 1 capsule (300 mg total) 3 (three) times daily for 24 days. 03/24/22 04/23/22 Yes Kerrin Champagne, MD  ?levocetirizine (XYZAL) 5 MG tablet TAKE 1 TABLET BY MOUTH EVERY DAY IN THE EVENING 03/07/22  Yes Doreene Eland, MD  ?levothyroxine (SYNTHROID) 75 MCG tablet TAKE 1 TABLET BY MOUTH DAILY BEFORE BREAKFAST. 03/07/22  Yes Doreene Eland, MD  ?losartan-hydrochlorothiazide (HYZAAR) 50-12.5 MG tablet Take 1 tablet by mouth daily. 03/07/22  Yes Doreene Eland, MD  ?meloxicam (MOBIC) 15 MG tablet Take 1 tablet (15 mg total) by mouth daily. 03/24/22 03/24/23 Yes Kerrin Champagne, MD  ?Multiple Vitamins-Minerals (HAIR/SKIN/NAILS/BIOTIN PO) Take 2 each by mouth daily.   Yes [provider]  ?oxyCODONE-acetaminophen (PERCOCET) 10-325 MG tablet Take 1 tablet by mouth 3 (three) times daily as needed. 04/26/21  Yes [provider]  ?pregabalin (LYRICA) 75 MG capsule Take 1 capsule (75 mg total) by mouth 2 (two) times daily. 03/07/22  Yes Doreene Eland, MD  ?fluticasone (FLONASE) 50 MCG/ACT nasal spray Place into both nostrils daily as needed. 03/05/22   [provider]  ?hydrOXYzine (ATARAX) 25 MG tablet Take 1 tablet (25 mg total) by mouth every 6 (six) hours as needed for anxiety. 03/07/22   Lilland, Alana, DO  ?tizanidine (ZANAFLEX) 2 MG capsule Take 1 capsule (2 mg total) by mouth 3 (three) times daily as needed for muscle spasms. ?Patient not taking: Reported on 04/20/2022 03/24/22   Kerrin Champagne, MD  ?loratadine (CLARITIN) 10 MG tablet Take 1 tablet (10 mg total) by mouth daily. 05/27/20 09/21/20  Domenick Gong, MD  ?SUMAtriptan (IMITREX) 50 MG tablet TAKE 1 TABLET AS NEEDED FOR MIGRAINE HEADACHE. MAY REPEAT DOSE ONCE AFTER 2 HRS. DON'T TAKE MORE THAN 200 MG/DAY. 08/26/20 09/21/20  Doreene Eland, MD  ? ? ?Current Outpatient Medications  ?Medication Sig Dispense Refill  ? atorvastatin (LIPITOR) 40 MG tablet Take 1 tablet (40 mg total) by mouth daily. 90 tablet 1  ? B  Complex-C (SUPER B COMPLEX/VITAMIN C PO) Take by mouth daily.    ? DULoxetine (CYMBALTA) 30 MG capsule Take 1 capsule (30 mg total) by mouth 2 (two) times daily. 180 capsule 1  ? gabapentin (NEURONTIN) 300 MG capsule Take 1 capsule (300 mg total) by mouth at bedtime for 3 days, THEN 1 capsule (300 mg total) 2 (two) times daily for 3 days, THEN 1 capsule (300 mg total) 3 (three) times daily for 24 days. 81 capsule 0  ? levocetirizine (XYZAL) 5 MG tablet TAKE 1 TABLET BY MOUTH EVERY DAY IN THE EVENING 90 tablet 1  ? levothyroxine (SYNTHROID) 75 MCG tablet TAKE 1 TABLET BY MOUTH DAILY BEFORE BREAKFAST. 90 tablet 0  ? losartan-hydrochlorothiazide (HYZAAR) 50-12.5 MG tablet Take 1 tablet by mouth daily. 90 tablet 1  ? meloxicam (MOBIC) 15 MG tablet Take 1 tablet (15 mg total) by mouth daily. 30 tablet 2  ? Multiple Vitamins-Minerals (HAIR/SKIN/NAILS/BIOTIN PO) Take 2 each by mouth daily.    ? oxyCODONE-acetaminophen (PERCOCET) 10-325 MG tablet Take 1 tablet by mouth 3 (three) times daily as needed.    ? pregabalin (LYRICA) 75  MG capsule Take 1 capsule (75 mg total) by mouth 2 (two) times daily. 60 capsule 1  ? fluticasone (FLONASE) 50 MCG/ACT nasal spray Place into both nostrils daily as needed.    ? hydrOXYzine (ATARAX) 25 MG tablet Take 1 tablet (25 mg total) by mouth every 6 (six) hours as needed for anxiety. 30 tablet 1  ? tizanidine (ZANAFLEX) 2 MG capsule Take 1 capsule (2 mg total) by mouth 3 (three) times daily as needed for muscle spasms. (Patient not taking: Reported on 04/20/2022) 40 capsule 3  ? ?Current Facility-Administered Medications  ?Medication Dose Route Frequency Provider Last Rate Last Admin  ? 0.9 %  sodium chloride infusion  500 mL Intravenous Once Raekwan Spelman, Eleonore ChiquitoKavitha V, MD      ? ? ?Allergies as of 04/20/2022 - Review Complete 04/20/2022  ?Allergen Reaction Noted  ? Augmentin [amoxicillin-pot clavulanate] Hives and Other (See Comments) 11/01/2015  ? Bactrim [sulfamethoxazole-trimethoprim] Hives and  Other (See Comments) 11/01/2015  ? Latex Hives 06/02/2015  ? Other  07/18/2018  ? Pollen extract  10/25/2013  ? Tape Rash 11/01/2015  ? ? ?Family History  ?Problem Relation Age of Onset  ? Lung cancer Mother [id number]

## 2022-04-20 NOTE — Progress Notes (Signed)
Report to PACU, RN, vss, BBS= Clear.  

## 2022-04-20 NOTE — Patient Instructions (Signed)
YOU HAD AN ENDOSCOPIC PROCEDURE TODAY AT THE [facility]:   Refer to the procedure report that was given to you for any specific questions about what was found during the examination.  If the procedure report does not answer your questions, please call your gastroenterologist to clarify.  If you requested that your care partner not be given the details of your procedure findings, then the procedure report has been included in a sealed envelope for you to review at your convenience later.  YOU SHOULD EXPECT: Some feelings of bloating in the abdomen. Passage of more gas than usual.  Walking can help get rid of the air that was put into your GI tract during the procedure and reduce the bloating. If you had a lower endoscopy (such as a colonoscopy or flexible sigmoidoscopy) you may notice spotting of blood in your stool or on the toilet paper. If you underwent a bowel prep for your procedure, you may not have a normal bowel movement for a few days.  Please Note:  You might notice some irritation and congestion in your nose or some drainage.  This is from the oxygen used during your procedure.  There is no need for concern and it should clear up in a day or so.  SYMPTOMS TO REPORT IMMEDIATELY:   Following lower endoscopy (colonoscopy or flexible sigmoidoscopy):  Excessive amounts of blood in the stool  Significant tenderness or worsening of abdominal pains  Swelling of the abdomen that is new, acute  Fever of 100F or higher   Following upper endoscopy (EGD)  Vomiting of blood or coffee ground material  New chest pain or pain under the shoulder blades  Painful or persistently difficult swallowing  New shortness of breath  Fever of 100F or higher  Black, tarry-looking stools  For urgent or emergent issues, a gastroenterologist can be reached at any hour by calling [phone]. Do not use MyChart messaging for urgent concerns.    DIET:  We do recommend a small meal at first, but  then you may proceed to your regular diet.  Drink plenty of fluids but you should avoid alcoholic beverages for 24 hours.  ACTIVITY:  You should plan to take it easy for the rest of today and you should NOT DRIVE or use heavy machinery until tomorrow (because of the sedation medicines used during the test).    FOLLOW UP: Our staff will call the number listed on your records 48-72 hours following your procedure to check on you and address any questions or concerns that you may have regarding the information given to you following your procedure. If we do not reach you, we will leave a message.  We will attempt to reach you two times.  During this call, we will ask if you have developed any symptoms of COVID 19. If you develop any symptoms (ie: fever, flu-like symptoms, shortness of breath, cough etc.) before then, please call [phone].  If you test positive for Covid 19 in the 2 weeks post procedure, please call and report this information to us.    If any biopsies were taken you will be contacted by phone or by letter within the next 1-3 weeks.  Please call us at [phone] if you have not heard about the biopsies in 3 weeks.    SIGNATURES/CONFIDENTIALITY: You and/or your care partner have signed paperwork which will be entered into your electronic medical record.  These signatures attest to the fact that that the information above on   your After Visit Summary has been reviewed and is understood.  Full responsibility of the confidentiality of this discharge information lies with you and/or your care-partner. 

## 2022-04-22 ENCOUNTER — Telehealth: Payer: Self-pay

## 2022-04-22 NOTE — Telephone Encounter (Signed)
?  Follow up Call- ? ? ?  04/20/2022  ?  2:34 PM  ?Call back number  ?Post procedure Call Back phone  # #[phone]  ?Permission to leave phone message Yes  ?  ? ?Patient questions: ? ?Do you have a fever, pain , or abdominal swelling? No. ?Pain Score  0 * ? ?Have you tolerated food without any problems? Yes.   ? ?Have you been able to return to your normal activities? Yes.   ? ?Do you have any questions about your discharge instructions: ?Diet   No. ?Medications  No. ?Follow up visit  No. ? ?Do you have questions or concerns about your Care? No. ? ?Actions: ?* If pain score is 4 or above: ?No action needed, pain <4. ? ? ?

## 2022-05-04 NOTE — Patient Instructions (Signed)
Visit Information  Instructions:   Patient was given the following information about care management and care coordination services today, agreed to services, and gave verbal consent: 1.care management/care coordination services include personalized support from designated clinical staff supervised by their physician, including individualized plan of care and coordination with other care providers 2. 24/7 contact phone numbers for assistance for urgent and routine care needs. 3. The patient may stop care management/care coordination services at any time by phone call to the office staff.  Patient verbalizes understanding of instructions and care plan provided today and agrees to view in MyChart. Active MyChart status and patient understanding of how to access instructions and care plan via MyChart confirmed with patient.     No further follow up required: .  Noa Constante, BSW , MSW Social Worker [facility] Management  [phone]      

## 2022-05-06 ENCOUNTER — Ambulatory Visit (INDEPENDENT_AMBULATORY_CARE_PROVIDER_SITE_OTHER): Payer: Medicare Other | Admitting: Licensed Clinical Social Worker

## 2022-05-06 ENCOUNTER — Encounter (HOSPITAL_COMMUNITY): Payer: Self-pay

## 2022-05-06 DIAGNOSIS — F4323 Adjustment disorder with mixed anxiety and depressed mood: Secondary | ICD-10-CM | POA: Diagnosis not present
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
## 2022-05-06 NOTE — Plan of Care (Signed)
Pt agreeable to plan  ?

## 2022-05-10 ENCOUNTER — Ambulatory Visit (INDEPENDENT_AMBULATORY_CARE_PROVIDER_SITE_OTHER): Payer: Medicare Other | Admitting: Psychology

## 2022-05-10 ENCOUNTER — Encounter: Payer: Self-pay | Admitting: Family Medicine

## 2022-05-10 DIAGNOSIS — R4589 Other symptoms and signs involving emotional state: Secondary | ICD-10-CM | POA: Diagnosis not present

## 2022-05-10 DIAGNOSIS — M545 Low back pain, unspecified: Secondary | ICD-10-CM | POA: Diagnosis not present

## 2022-05-10 DIAGNOSIS — R030 Elevated blood-pressure reading, without diagnosis of hypertension: Secondary | ICD-10-CM | POA: Diagnosis not present
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
## 2022-05-12 ENCOUNTER — Other Ambulatory Visit: Payer: Self-pay | Admitting: Family Medicine

## 2022-05-13 ENCOUNTER — Ambulatory Visit (INDEPENDENT_AMBULATORY_CARE_PROVIDER_SITE_OTHER): Payer: Medicare Other | Admitting: Plastic Surgery

## 2022-05-13 ENCOUNTER — Encounter: Payer: Self-pay | Admitting: Plastic Surgery

## 2022-05-13 VITALS — BP 165/92 | HR 80 | Ht 66.0 in | Wt 219.0 lb

## 2022-05-13 DIAGNOSIS — L910 Hypertrophic scar: Secondary | ICD-10-CM

## 2022-05-13 DIAGNOSIS — L298 Other pruritus: Secondary | ICD-10-CM | POA: Diagnosis not present
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
## 2022-05-16 ENCOUNTER — Encounter: Payer: Self-pay | Admitting: Family Medicine

## 2022-05-17 ENCOUNTER — Ambulatory Visit: Payer: Medicare Other | Admitting: Family Medicine

## 2022-05-17 ENCOUNTER — Encounter: Payer: Self-pay | Admitting: *Deleted

## 2022-05-24 ENCOUNTER — Ambulatory Visit: Payer: Medicare Other | Admitting: Psychology

## 2022-05-26 ENCOUNTER — Ambulatory Visit (INDEPENDENT_AMBULATORY_CARE_PROVIDER_SITE_OTHER): Payer: Medicare Other | Admitting: Specialist

## 2022-05-26 ENCOUNTER — Encounter: Payer: Self-pay | Admitting: Specialist

## 2022-05-26 ENCOUNTER — Ambulatory Visit: Payer: Self-pay

## 2022-05-26 VITALS — BP 145/88 | HR 70 | Ht 66.0 in | Wt 219.0 lb

## 2022-05-26 DIAGNOSIS — M4322 Fusion of spine, cervical region: Secondary | ICD-10-CM | POA: Diagnosis not present

## 2022-05-26 DIAGNOSIS — M542 Cervicalgia: Secondary | ICD-10-CM
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
## 2022-05-27 ENCOUNTER — Other Ambulatory Visit: Payer: Self-pay | Admitting: Specialist

## 2022-05-27 ENCOUNTER — Encounter: Payer: Self-pay | Admitting: Family Medicine

## 2022-05-27 MED ORDER — GABAPENTIN 300 MG PO CAPS: ORAL_CAPSULE | ORAL | 0 refills | Status: DC

## 2022-05-27 MED ORDER — HYDROXYZINE HCL 25 MG PO TABS: ORAL_TABLET | ORAL | 1 refills | Status: DC

## 2022-05-31 ENCOUNTER — Ambulatory Visit (HOSPITAL_BASED_OUTPATIENT_CLINIC_OR_DEPARTMENT_OTHER): Payer: Medicare Other | Admitting: Physical Therapy

## 2022-06-03 ENCOUNTER — Ambulatory Visit: Payer: Medicare Other | Admitting: Family Medicine

## 2022-06-07 DIAGNOSIS — Z79899 Other long term (current) drug therapy: Secondary | ICD-10-CM | POA: Diagnosis not present

## 2022-06-07 DIAGNOSIS — Z760 Encounter for issue of repeat prescription: Secondary | ICD-10-CM | POA: Diagnosis not present

## 2022-06-07 DIAGNOSIS — M545 Low back pain, unspecified: Secondary | ICD-10-CM | POA: Diagnosis not present

## 2022-06-10 ENCOUNTER — Telehealth (HOSPITAL_COMMUNITY): Payer: Self-pay | Admitting: Licensed Clinical Social Worker

## 2022-06-10 ENCOUNTER — Ambulatory Visit (HOSPITAL_COMMUNITY): Payer: Medicare Other | Admitting: Licensed Clinical Social Worker

## 2022-06-10 ENCOUNTER — Encounter (HOSPITAL_COMMUNITY): Payer: Self-pay

## 2022-06-10 NOTE — Telephone Encounter (Signed)
LCSW sent three links to pt phone with no response at 11, 1105, and 11:11. LCSW disconnected at 1115. Pt will be marked as a no show.

## 2022-06-24 ENCOUNTER — Encounter: Payer: Self-pay | Admitting: Surgical

## 2022-06-24 ENCOUNTER — Ambulatory Visit (INDEPENDENT_AMBULATORY_CARE_PROVIDER_SITE_OTHER): Payer: Medicare Other | Admitting: Surgical

## 2022-06-24 DIAGNOSIS — L910 Hypertrophic scar: Secondary | ICD-10-CM | POA: Diagnosis not present

## 2022-06-24 NOTE — Progress Notes (Signed)
   Referring Provider Doreene Eland, MD [address] [place],  [place] [address]   CC:  Chief Complaint  Patient presents with   Skin Problem   Follow-up      Lisa Newton is an 51 y.o. female.  HPI: 51 year old female here for follow-up and reevaluation of her left neck keloid that she has from a surgical site from neck surgery.  She had an initial keloid injection with Dr. Domenica Reamer on 05/13/2022, she tolerated this well.  She is interested in additional steroid injection today.   Review of Systems General: No fevers or chills  Physical Exam    05/26/2022   11:17 AM 05/13/2022   10:27 AM 04/20/2022    3:46 PM  Vitals with BMI  Height 5\' 6"  5\' 6"    Weight 219 lbs 219 lbs   BMI 35.36 35.36   Systolic 145 165  Diastolic 88 92 79  Pulse 70 80 78    General:  No acute distress,  Alert and oriented, Non-Toxic, Normal speech and affect HEENT: Left neck keloid that is approximately 1 x 4 cm.  No cellulitic changes or surrounding skin changes noted.  Hyperpigmentation noted of the keloid.  Assessment/Plan Patient elected to proceed with additional keloid injection today in the office.  This would be round 2 of 3.  23% lidocaine/7% tetracaine topical ointment was applied for 20 minutes prior to injection.  The area was then cleaned with alcohol and wiped dry.  The area was then cleaned again with alcohol prior to injection and I injected 1 cc of Kenalog mixed with 1 cc of lidocaine with epinephrine, 1:100,000.  Patient tolerated the procedure well.  There were no complications.  All of her questions were answered to her content.  Recommend follow-up in 6 weeks for reevaluation.  Recommend calling with questions or concerns. Patient tolerated the procedure well.  Lisa Newton 06/24/2022, 10:29 AM

## 2022-07-01 ENCOUNTER — Ambulatory Visit (HOSPITAL_COMMUNITY): Payer: Self-pay | Admitting: Licensed Clinical Social Worker

## 2022-07-12 ENCOUNTER — Ambulatory Visit (HOSPITAL_BASED_OUTPATIENT_CLINIC_OR_DEPARTMENT_OTHER): Payer: Medicare Other | Admitting: Physical Therapy

## 2022-07-16 DIAGNOSIS — M545 Low back pain, unspecified: Secondary | ICD-10-CM | POA: Diagnosis not present

## 2022-07-16 DIAGNOSIS — Z79899 Other long term (current) drug therapy: Secondary | ICD-10-CM | POA: Diagnosis not present

## 2022-07-19 DIAGNOSIS — Z79899 Other long term (current) drug therapy: Secondary | ICD-10-CM | POA: Diagnosis not present

## 2022-07-21 ENCOUNTER — Ambulatory Visit (HOSPITAL_BASED_OUTPATIENT_CLINIC_OR_DEPARTMENT_OTHER): Payer: Medicare Other | Attending: Specialist | Admitting: Physical Therapy

## 2022-07-29 ENCOUNTER — Other Ambulatory Visit: Payer: Self-pay | Admitting: Family Medicine

## 2022-07-29 DIAGNOSIS — Z760 Encounter for issue of repeat prescription: Secondary | ICD-10-CM

## 2022-08-04 ENCOUNTER — Ambulatory Visit: Payer: Medicare Other | Admitting: Surgical

## 2022-08-09 ENCOUNTER — Other Ambulatory Visit: Payer: Self-pay | Admitting: Family Medicine

## 2022-08-09 ENCOUNTER — Ambulatory Visit
Admission: RE | Admit: 2022-08-09 | Discharge: 2022-08-09 | Disposition: A | Discharge status: Home / Self Care | Payer: Medicare Other | Source: Ambulatory Visit | Attending: Family Medicine | Admitting: Family Medicine

## 2022-08-09 DIAGNOSIS — N6310 Unspecified lump in the right breast, unspecified quadrant: Secondary | ICD-10-CM

## 2022-08-09 DIAGNOSIS — N6312 Unspecified lump in the right breast, upper inner quadrant: Secondary | ICD-10-CM | POA: Diagnosis not present

## 2022-08-09 DIAGNOSIS — N6311 Unspecified lump in the right breast, upper outer quadrant: Secondary | ICD-10-CM | POA: Diagnosis not present

## 2022-08-10 ENCOUNTER — Encounter: Payer: Self-pay | Admitting: Family Medicine

## 2022-08-11 ENCOUNTER — Ambulatory Visit (INDEPENDENT_AMBULATORY_CARE_PROVIDER_SITE_OTHER): Payer: Medicare Other | Admitting: Family Medicine

## 2022-08-11 VITALS — BP 151/105 | HR 65 | Ht 66.0 in | Wt 214.6 lb

## 2022-08-11 DIAGNOSIS — G43809 Other migraine, not intractable, without status migrainosus: Secondary | ICD-10-CM | POA: Diagnosis not present

## 2022-08-11 MED ORDER — ONDANSETRON 4 MG PO TBDP: 4.0000 mg | ORAL_TABLET | Freq: Three times a day (TID) | ORAL | 0 refills | Status: DC | PRN

## 2022-08-11 MED ORDER — ONDANSETRON HCL 4 MG/2ML IJ SOLN
4.0000 mg | Freq: Once | INTRAMUSCULAR | Status: AC
  Administered 2022-08-11: 4 mg via INTRAMUSCULAR

## 2022-08-11 MED ORDER — KETOROLAC TROMETHAMINE 30 MG/ML IJ SOLN
15.0000 mg | Freq: Once | INTRAMUSCULAR | Status: AC
  Administered 2022-08-11: 15 mg via INTRAMUSCULAR

## 2022-08-11 MED ORDER — ONDANSETRON 4 MG PO TBDP: 4.0000 mg | ORAL_TABLET | Freq: Once | ORAL | Status: DC

## 2022-08-11 MED ORDER — SUMATRIPTAN SUCCINATE 6 MG/0.5ML ~~LOC~~ SOLN
6.0000 mg | Freq: Once | SUBCUTANEOUS | Status: AC
  Administered 2022-08-11: 6 mg via SUBCUTANEOUS

## 2022-08-11 MED ORDER — SUMATRIPTAN SUCCINATE 50 MG PO TABS
ORAL_TABLET | ORAL | 0 refills | Status: DC
Start: 2022-08-11 — End: 2022-08-16

## 2022-08-11 NOTE — Patient Instructions (Addendum)
It was nice seeing you today!  Take sumatriptan as needed for migraine. Wait at least 2 hours before taking a second tablet and no more than 2 tablets in a day.  Zofran as needed for vomiting.  Let us know if you are not feeling better.  Stay well, Littie Deeds, MD [facility] [phone]  --  Make sure to check out at the front desk before you leave today.  Please arrive at least 15 minutes prior to your scheduled appointments.  If you had blood work today, I will send you a MyChart message or a letter if results are normal. Otherwise, I will give you a call.  If you had a referral placed, they will call you to set up an appointment. Please give [place] a call if you don't hear back in the next 2 weeks.  If you need additional refills before your next appointment, please call your pharmacy first.

## 2022-08-11 NOTE — Progress Notes (Addendum)
    SUBJECTIVE:   CHIEF COMPLAINT / HPI:   Lisa Newton is a 51 y.o. female who presents today for migraine.  Migraine Pt reports first experiencing migraines after a 2019/2019 L ear surgery which she reports had complications. She was treated at the time with sumatriptan and had a period of years without subsequent migraine. She now has had a headache of varying intensity starting on Sat 08/06/2022. Prior to this, she was experiencing diarrhea during the week leading up to her migraine episode. She also reports receiving a steroid shot to her neck for keloid, which she believes might have triggered her headache. No visual or olfactory aura preceding. Headache is described as a "throbbing" in her bilateral temples. She has photosensitivity, noise sensitivity; pain worsens with movement. She reports feeling hot and diaphoretic; reports some neck stiffness. She reports N/V associated with her headache, and reports she has not been able to keep any solids or liquids down. She has not taken any medication for her headache. She denies diarrhea since headache began.   Allergies She is wondering about getting stronger allergy meds.  PERTINENT  PMH / PSH: Chronic migraine, HTN, MDD, chronic back pain  OBJECTIVE:   BP (!) 151/105   Pulse 65   Ht 5\' 6"  (1.676 m)   Wt 214 lb 9.6 oz (97.3 kg)   LMP  (LMP Unknown)   SpO2 100%   BMI 34.64 kg/m   General: Patient appears uncomfortable and in acute distress, occasionally grasping her head and shifting frequently in her seat. Lights turned off upon entering room. Cardiovascular: RRR, no murmurs, rubs, gallops. Pulmonary: Normal work of breathing. Lungs clear to auscultation bilaterally. Neuro: CNII-XII intact. Strength 5/5 in bilateral upper, lower extremities. Psych: A&Ox3. Occasionally teary affect.  ASSESSMENT/PLAN:   Migraine She previously experienced migraines followed ear surgery in 2018/2019; migraines had abated for years. She now  reports ongoing headache since 08/06/2022, described as throbbing sensation in bilateral temple region. Associated N/V, photosensitivity, noise sensitivity. She has not taken anything for this. Given pt's description of symptoms and history of prior migraine, I suspect current symptoms due to migraine.  - Pt given Toradol, Sofran, and sumatriptan shots in clinic today; symptoms improved after - Will prescribe Zofran for patient to use prn - Will prescribe sumatriptan pills for patient to use prn - Pt encouraged to reach back out to the office if symptoms do not improve or worsen   Allergies Pt currently on max dose Xyzal and Flonase. - Pt told we cannot increase her allergy medication dosages; she is amenable to this  08/08/2022, Medical Student [facility]   Symptoms suggestive of severe migraine.  Neurologically intact.  Given IM ketorolac 15 mg in clinic with minimal improvement. Then given Zofran and Ayr sumatriptan 6 mg.  Patient was observed for about 2 hours, noted to have significant improvement with medications.  Rx for sumatriptan as needed and Zofran as needed provided.  Patient will let Governor Rooks know if symptoms or not improving.  I was personally present and performed or re-performed the history, physical exam and medical decision making activities of this service and have verified that the service and findings are accurately documented in the student's note.  [place], MD                  08/11/2022, 1:58 PM

## 2022-08-11 NOTE — Assessment & Plan Note (Addendum)
She previously experienced migraines followed ear surgery in 2018/2019; migraines had abated for years. She now reports ongoing headache since 08/06/2022, described as throbbing sensation in bilateral temple region. Associated N/V, photosensitivity, noise sensitivity. She has not taken anything for this. Given pt's description of symptoms and history of prior migraine, I suspect current symptoms due to migraine.  - Pt given Toradol, Sofran, and sumatriptan shots in clinic today; symptoms improved after - Will prescribe Zofran for patient to use prn - Will prescribe sumatriptan pills for patient to use prn - Pt encouraged to reach back out to the office if symptoms do not improve or worsen

## 2022-08-14 ENCOUNTER — Other Ambulatory Visit: Payer: Self-pay

## 2022-08-14 ENCOUNTER — Encounter (HOSPITAL_BASED_OUTPATIENT_CLINIC_OR_DEPARTMENT_OTHER): Payer: Self-pay | Admitting: Emergency Medicine

## 2022-08-14 ENCOUNTER — Emergency Department (HOSPITAL_BASED_OUTPATIENT_CLINIC_OR_DEPARTMENT_OTHER)
Admission: EM | Admit: 2022-08-14 | Discharge: 2022-08-14 | Disposition: A | Discharge status: Home / Self Care | Payer: Medicare Other | Attending: Emergency Medicine | Admitting: Emergency Medicine

## 2022-08-14 ENCOUNTER — Emergency Department (HOSPITAL_BASED_OUTPATIENT_CLINIC_OR_DEPARTMENT_OTHER): Payer: Medicare Other

## 2022-08-14 DIAGNOSIS — R110 Nausea: Secondary | ICD-10-CM | POA: Diagnosis not present

## 2022-08-14 DIAGNOSIS — R519 Headache, unspecified: Secondary | ICD-10-CM | POA: Diagnosis not present

## 2022-08-14 DIAGNOSIS — Z79899 Other long term (current) drug therapy: Secondary | ICD-10-CM | POA: Insufficient documentation

## 2022-08-14 DIAGNOSIS — G43909 Migraine, unspecified, not intractable, without status migrainosus: Secondary | ICD-10-CM | POA: Diagnosis not present

## 2022-08-14 DIAGNOSIS — Z743 Need for continuous supervision: Secondary | ICD-10-CM | POA: Diagnosis not present

## 2022-08-14 DIAGNOSIS — I10 Essential (primary) hypertension: Secondary | ICD-10-CM | POA: Insufficient documentation

## 2022-08-14 DIAGNOSIS — G43911 Migraine, unspecified, intractable, with status migrainosus: Secondary | ICD-10-CM | POA: Diagnosis not present

## 2022-08-14 DIAGNOSIS — Z91040 Latex allergy status: Secondary | ICD-10-CM | POA: Insufficient documentation

## 2022-08-14 DIAGNOSIS — R1111 Vomiting without nausea: Secondary | ICD-10-CM | POA: Diagnosis not present

## 2022-08-14 DIAGNOSIS — G4489 Other headache syndrome: Secondary | ICD-10-CM | POA: Diagnosis not present

## 2022-08-14 DIAGNOSIS — R0689 Other abnormalities of breathing: Secondary | ICD-10-CM | POA: Diagnosis not present

## 2022-08-14 LAB — BASIC METABOLIC PANEL
Anion gap: 9 (ref 5–15)
BUN: 14 mg/dL (ref 6–20)
CO2: 26 mmol/L (ref 22–32)
Calcium: 9.8 mg/dL (ref 8.9–10.3)
Chloride: 105 mmol/L (ref 98–111)
Creatinine, Ser: 0.82 mg/dL (ref 0.44–1.00)
GFR, Estimated: >60 mL/min (ref >60)
Glucose, Bld: 106 mg/dL — ABNORMAL HIGH (ref 70–99)
Potassium: 3.4 mmol/L — ABNORMAL LOW (ref 3.5–5.1)
Sodium: 140 mmol/L (ref 135–145)

## 2022-08-14 LAB — CBC WITH DIFFERENTIAL/PLATELET
Abs Immature Granulocytes: 0.07 10*3/uL (ref 0.00–0.07)
Basophils Absolute: 0.1 10*3/uL (ref 0.0–0.1)
Basophils Relative: 1 %
Eosinophils Absolute: 0.1 10*3/uL (ref 0.0–0.5)
Eosinophils Relative: 1 %
HCT: 40.2 % (ref 36.0–46.0)
Hemoglobin: 13.6 g/dL (ref 12.0–15.0)
Immature Granulocytes: 1 %
Lymphocytes Relative: 13 %
Lymphs Abs: 1.6 10*3/uL (ref 0.7–4.0)
MCH: 31.2 pg (ref 26.0–34.0)
MCHC: 33.8 g/dL (ref 30.0–36.0)
MCV: 92.2 fL (ref 80.0–100.0)
Monocytes Absolute: 0.4 10*3/uL (ref 0.1–1.0)
Monocytes Relative: 3 %
Neutro Abs: 10.2 10*3/uL — ABNORMAL HIGH (ref 1.7–7.7)
Neutrophils Relative %: 81 %
Platelets: 231 10*3/uL (ref 150–400)
RBC: 4.36 MIL/uL (ref 3.87–5.11)
RDW: 12.7 % (ref 11.5–15.5)
WBC: 12.4 10*3/uL — ABNORMAL HIGH (ref 4.0–10.5)
nRBC: 0 % (ref 0.0–0.2)

## 2022-08-14 LAB — HCG, SERUM, QUALITATIVE: Preg, Serum: NEGATIVE

## 2022-08-14 MED ORDER — DIPHENHYDRAMINE HCL 50 MG/ML IJ SOLN
25.0000 mg | Freq: Once | INTRAMUSCULAR | Status: AC
  Administered 2022-08-14: 25 mg via INTRAVENOUS
  Filled 2022-08-14: qty 1

## 2022-08-14 MED ORDER — DEXAMETHASONE SODIUM PHOSPHATE 10 MG/ML IJ SOLN
10.0000 mg | Freq: Once | INTRAMUSCULAR | Status: AC
  Administered 2022-08-14: 10 mg via INTRAVENOUS
  Filled 2022-08-14: qty 1

## 2022-08-14 MED ORDER — SODIUM CHLORIDE 0.9 % IV SOLN: INTRAVENOUS | Status: DC

## 2022-08-14 MED ORDER — METOCLOPRAMIDE HCL 5 MG/ML IJ SOLN
10.0000 mg | Freq: Once | INTRAMUSCULAR | Status: AC
  Administered 2022-08-14: 10 mg via INTRAVENOUS
  Filled 2022-08-14: qty 2

## 2022-08-14 MED ORDER — KETOROLAC TROMETHAMINE 30 MG/ML IJ SOLN
30.0000 mg | Freq: Once | INTRAMUSCULAR | Status: AC
  Administered 2022-08-14: 30 mg via INTRAVENOUS

## 2022-08-14 MED ORDER — SODIUM CHLORIDE 0.9 % IV BOLUS
1000.0000 mL | Freq: Once | INTRAVENOUS | Status: AC
  Administered 2022-08-14: 1000 mL via INTRAVENOUS

## 2022-08-14 NOTE — Discharge Instructions (Signed)
Rest at home in a dark room.  Hopefully the Decadron will abate the headache by morning.  If not follow-up as needed.  Return for any new or worse symptoms.  Head CT negative no acute findings.

## 2022-08-14 NOTE — ED Provider Notes (Signed)
[facility] EMERGENCY DEPT Provider Note   CSN: [id number] Arrival date & time: 08/14/22  1027     History  Chief Complaint  Patient presents with   Headache    Lisa Newton is a 51 y.o. female.  Patient with a known history of migraines but has not had a migraine in over a year.  Seen on Friday by primary care treated in the office for migraine got Toradol and some other medications some improvement but it never abated.  Patient denies any visual changes.  Patient denies any fevers.  Does have nausea and vomiting associated with it.  Temp here 98 degrees.  Past medical history is extensive depression hypertension neck pain degenerative disc disease cervical and lumbar area history of chronic headaches hypothyroidism hyperlipidemia.  Patient has had posterior cervical fusion patient is a non-smoker.  And patient is as recent as May 2022 had anterior cervical decompression discectomy fusion.       Home Medications Prior to Admission medications   Medication Sig Start Date End Date Taking? Authorizing Provider  atorvastatin (LIPITOR) 40 MG tablet Take 1 tablet (40 mg total) by mouth daily. 03/07/22   Doreene Eland, MD  B Complex-C (SUPER B COMPLEX/VITAMIN C PO) Take by mouth daily.    [provider]  DULoxetine (CYMBALTA) 30 MG capsule Take 1 capsule (30 mg total) by mouth 2 (two) times daily. 03/30/22   Doreene Eland, MD  fluticasone (FLONASE) 50 MCG/ACT nasal spray Place into both nostrils daily as needed. 03/05/22   [provider]  gabapentin (NEURONTIN) 300 MG capsule Take 1 capsule (300 mg total) by mouth at bedtime for 3 days, THEN 1 capsule (300 mg total) 2 (two) times daily for 3 days, THEN 1 capsule (300 mg total) 3 (three) times daily for 24 days. 05/27/22 06/26/22  Kerrin Champagne, MD  hydrOXYzine (ATARAX) 25 MG tablet TAKE 1 TABLET(25 MG) BY MOUTH EVERY 6 HOURS AS NEEDED FOR ANXIETY 05/27/22   Doreene Eland, MD  levocetirizine  (XYZAL) 5 MG tablet TAKE 1 TABLET BY MOUTH EVERY DAY IN THE EVENING 03/07/22   Doreene Eland, MD  levothyroxine (SYNTHROID) 75 MCG tablet TAKE 1 TABLET BY MOUTH DAILY BEFORE BREAKFAST. 03/07/22   Doreene Eland, MD  losartan-hydrochlorothiazide (HYZAAR) 50-12.5 MG tablet TAKE 1 TABLET BY MOUTH EVERY DAY 07/29/22   Doreene Eland, MD  meloxicam (MOBIC) 15 MG tablet Take 1 tablet (15 mg total) by mouth daily. Patient not taking: Reported on 08/11/2022 03/24/22 03/24/23  Kerrin Champagne, MD  Multiple Vitamins-Minerals (HAIR/SKIN/NAILS/BIOTIN PO) Take 2 each by mouth daily.    [provider]  ondansetron (ZOFRAN-ODT) 4 MG disintegrating tablet Take 1 tablet (4 mg total) by mouth every 8 (eight) hours as needed for nausea or vomiting. 08/11/22   Littie Deeds, MD  oxyCODONE-acetaminophen (PERCOCET) 10-325 MG tablet Take 1 tablet by mouth 3 (three) times daily as needed. 04/26/21   [provider]  pregabalin (LYRICA) 75 MG capsule Take 1 capsule (75 mg total) by mouth 2 (two) times daily. Patient not taking: Reported on 08/11/2022 03/07/22   Doreene Eland, MD  SUMAtriptan (IMITREX) 50 MG tablet May repeat in 2 hours if headache persists or recurs. 08/11/22   Littie Deeds, MD  tizanidine (ZANAFLEX) 2 MG capsule Take 1 capsule (2 mg total) by mouth 3 (three) times daily as needed for muscle spasms. Patient not taking: Reported on 08/11/2022 03/24/22   Kerrin Champagne, MD  loratadine (CLARITIN) 10 MG tablet Take 1 tablet (10 mg total) by mouth daily. 05/27/20 09/21/20  Domenick Gong, MD      Allergies    Augmentin [amoxicillin-pot clavulanate], Bactrim [sulfamethoxazole-trimethoprim], Latex, Other, Pollen extract, and Tape    Review of Systems   Review of Systems  Constitutional:  Negative for chills and fever.  HENT:  Negative for ear pain and sore throat.   Eyes:  Negative for pain and visual disturbance.  Respiratory:  Negative for cough and shortness of breath.    Cardiovascular:  Negative for chest pain and palpitations.  Gastrointestinal:  Positive for nausea and vomiting. Negative for abdominal pain.  Genitourinary:  Negative for dysuria and hematuria.  Musculoskeletal:  Negative for arthralgias and back pain.  Skin:  Negative for color change and rash.  Neurological:  Positive for headaches. Negative for seizures and syncope.  All other systems reviewed and are negative.   Physical Exam Updated Vital Signs BP (!) 148/85   Pulse 72   Temp 98 F (36.7 C) (Oral)   Resp 20   Ht 1.676 m (5\' 6" )   Wt 97.3 kg   LMP  (LMP Unknown)   SpO2 98%   BMI 34.62 kg/m  Physical Exam Vitals and nursing note reviewed.  Constitutional:      General: She is not in acute distress.    Appearance: Normal appearance. She is well-developed.  HENT:     Head: Normocephalic and atraumatic.     Mouth/Throat:     Mouth: Mucous membranes are moist.  Eyes:     Extraocular Movements: Extraocular movements intact.     Conjunctiva/sclera: Conjunctivae normal.     Pupils: Pupils are equal, round, and reactive to light.  Cardiovascular:     Rate and Rhythm: Normal rate and regular rhythm.     Heart sounds: No murmur heard. Pulmonary:     Effort: Pulmonary effort is normal. No respiratory distress.     Breath sounds: Normal breath sounds.  Abdominal:     Palpations: Abdomen is soft.     Tenderness: There is no abdominal tenderness.  Musculoskeletal:        General: No swelling.     Cervical back: Normal range of motion and neck supple. No rigidity.  Skin:    General: Skin is warm and dry.     Capillary Refill: Capillary refill takes less than 2 seconds.  Neurological:     General: No focal deficit present.     Mental Status: She is alert and oriented to person, place, and time.     Cranial Nerves: No cranial nerve deficit.     Sensory: No sensory deficit.  Psychiatric:        Mood and Affect: Mood normal.     ED Results / Procedures / Treatments    Labs (all labs ordered are listed, but only abnormal results are displayed) Labs Reviewed  BASIC METABOLIC PANEL - Abnormal; Notable for the following components:      Result Value   Potassium 3.4 (*)    Glucose, Bld 106 (*)    All other components within normal limits  CBC WITH DIFFERENTIAL/PLATELET - Abnormal; Notable for the following components:   WBC 12.4 (*)    Neutro Abs 10.2 (*)    All other components within normal limits  HCG, SERUM, QUALITATIVE  CBC WITH DIFFERENTIAL/PLATELET    EKG None  Radiology CT Head Wo Contrast  Result Date: 08/14/2022 CLINICAL DATA:  Sudden onset headache. EXAM: CT  HEAD WITHOUT CONTRAST TECHNIQUE: Contiguous axial images were obtained from the base of the skull through the vertex without intravenous contrast. RADIATION DOSE REDUCTION: This exam was performed according to the departmental dose-optimization program which includes automated exposure control, adjustment of the mA and/or kV according to patient size and/or use of iterative reconstruction technique. COMPARISON:  None. FINDINGS: Brain: There is no evidence for acute hemorrhage, hydrocephalus, mass lesion, or abnormal extra-axial fluid collection. No definite CT evidence for acute infarction. Vascular: No hyperdense vessel or unexpected calcification. Skull: No evidence for fracture. No worrisome lytic or sclerotic lesion. Sinuses/Orbits: The visualized paranasal sinuses and mastoid air cells are clear. Visualized portions of the globes and intraorbital fat are unremarkable. Other: None. IMPRESSION: Unremarkable.  No acute intracranial abnormality. Electronically Signed   By: Kennith Center M.D.   On: 08/14/2022 12:09    Procedures Procedures    Medications Ordered in ED Medications  0.9 %  sodium chloride infusion (0 mLs Intravenous Hold 08/14/22 1147)  sodium chloride 0.9 % bolus 1,000 mL (0 mLs Intravenous Stopped 08/14/22 1319)  dexamethasone (DECADRON) injection 10 mg (10 mg Intravenous  Given 08/14/22 1143)  metoCLOPramide (REGLAN) injection 10 mg (10 mg Intravenous Given 08/14/22 1145)  diphenhydrAMINE (BENADRYL) injection 25 mg (25 mg Intravenous Given 08/14/22 1144)  ketorolac (TORADOL) 30 MG/ML injection 30 mg (30 mg Intravenous Given 08/14/22 1411)    ED Course/ Medical Decision Making/ A&P                           Medical Decision Making Amount and/or Complexity of Data Reviewed Labs: ordered. Radiology: ordered.  Risk Prescription drug management.   Patient received migraine cocktail here.  Patient received IV Decadron Reglan and Benadryl and Toradol.  Patient overall have some improvement but states the head is not completely gone.  Patient's head CT without any acute findings.  CBC a mild leukocytosis 12.4 hemoglobin is good.  Pregnancy test negative.  Basic metabolic panel potassium just down a little bit at 3.4 renal function normal. Final Clinical Impression(s) / ED Diagnoses Final diagnoses:  Intractable migraine with status migrainosus, unspecified migraine type    Rx / DC Orders ED Discharge Orders     None         Vanetta Mulders, MD 08/14/22 1435

## 2022-08-14 NOTE — ED Triage Notes (Signed)
Migraine for 3 days. Pt was seen on 31st , tx at an ER , she was treated and released. Said she ran out of her migraine meds . Endorses n/v.

## 2022-08-14 NOTE — ED Notes (Signed)
Patient still endorsing 10/10 Discomfort Level; EDP made aware.

## 2022-08-14 NOTE — ED Notes (Signed)
Dc instructions reviewed with patient. Patient voiced understanding. Dc with belongings.  °

## 2022-08-14 NOTE — ED Notes (Signed)
MD at the Bedside. 

## 2022-08-16 ENCOUNTER — Other Ambulatory Visit: Payer: Self-pay | Admitting: Family Medicine

## 2022-08-16 ENCOUNTER — Ambulatory Visit (INDEPENDENT_AMBULATORY_CARE_PROVIDER_SITE_OTHER): Payer: Medicare Other

## 2022-08-16 ENCOUNTER — Ambulatory Visit: Payer: Medicare Other

## 2022-08-16 DIAGNOSIS — Z111 Encounter for screening for respiratory tuberculosis: Secondary | ICD-10-CM

## 2022-08-16 DIAGNOSIS — M545 Low back pain, unspecified: Secondary | ICD-10-CM | POA: Diagnosis not present

## 2022-08-16 DIAGNOSIS — Z79899 Other long term (current) drug therapy: Secondary | ICD-10-CM | POA: Diagnosis not present

## 2022-08-16 MED ORDER — SUMATRIPTAN SUCCINATE 50 MG PO TABS
ORAL_TABLET | ORAL | 0 refills | Status: DC
Start: 2022-08-16 — End: 2022-10-07

## 2022-08-16 NOTE — Telephone Encounter (Signed)
Patient walked in to request refill of:  Name of Medication(s):  Sumatriptan Last date of OV:  08/11/22 Pharmacy: [facility]    Will route refill request to Clinic RN.  Discussed with patient policy to call pharmacy for future refills.  Also, discussed refills may take up to 48 hours to approve or deny.  Vilinda Blanks

## 2022-08-16 NOTE — Progress Notes (Signed)
Patient is here for a PPD placement.  PPD placed in left forearm @ 2:10 pm.  Patient will return 08/18/2022 to have PPD read. Veronda Prude, RN

## 2022-08-17 ENCOUNTER — Encounter: Payer: Self-pay | Admitting: Family Medicine

## 2022-08-17 ENCOUNTER — Other Ambulatory Visit: Payer: Self-pay | Admitting: Family Medicine

## 2022-08-17 MED ORDER — IBUPROFEN 400 MG PO TABS: 400.0000 mg | ORAL_TABLET | Freq: Two times a day (BID) | ORAL | 0 refills | Status: DC | PRN

## 2022-08-18 ENCOUNTER — Other Ambulatory Visit: Payer: Self-pay | Admitting: Family Medicine

## 2022-08-18 ENCOUNTER — Other Ambulatory Visit: Payer: Medicare Other

## 2022-08-18 ENCOUNTER — Ambulatory Visit (INDEPENDENT_AMBULATORY_CARE_PROVIDER_SITE_OTHER): Payer: Medicare Other

## 2022-08-18 DIAGNOSIS — R7611 Nonspecific reaction to tuberculin skin test without active tuberculosis: Secondary | ICD-10-CM

## 2022-08-18 DIAGNOSIS — Z111 Encounter for screening for respiratory tuberculosis: Secondary | ICD-10-CM

## 2022-08-18 DIAGNOSIS — E876 Hypokalemia: Secondary | ICD-10-CM | POA: Diagnosis not present

## 2022-08-18 LAB — TB SKIN TEST
Induration: 10 mm
TB Skin Test: POSITIVE

## 2022-08-18 NOTE — Telephone Encounter (Signed)
Called pt to informed her about her medication. Pt stated that she picked up the medication but she is still having terrible migraines. Pt stated that when she went to the hospital and they told her that her potassium was low so she doesn't know if that's what's causing her headaches or not . Pt has an appt in nurse clinic today at 2:3 pm.

## 2022-08-18 NOTE — Progress Notes (Signed)
Patient presents to nurse clinic for PPD read. PPD placed on 08/16/22 at 2:10 pm.   Interpretation: Site- redness Induration measuring 10 mm.   Precepted with Dr. Lum Babe. Recommended chest x-ray due to positive result. Dr. Lum Babe placed orders. Patient made aware of plan of care.   Veronda Prude, RN

## 2022-08-18 NOTE — Telephone Encounter (Signed)
Schedule pt for lab visit today at 2:15 pm before her 2:30 pm appt. Pt is also seeing Dr.Maxwell on 09/11 for a physical.

## 2022-08-19 ENCOUNTER — Encounter: Payer: Self-pay | Admitting: Family Medicine

## 2022-08-19 ENCOUNTER — Telehealth: Payer: Self-pay | Admitting: Family Medicine

## 2022-08-19 ENCOUNTER — Other Ambulatory Visit: Payer: Self-pay | Admitting: Family Medicine

## 2022-08-19 DIAGNOSIS — R7611 Nonspecific reaction to tuberculin skin test without active tuberculosis: Secondary | ICD-10-CM

## 2022-08-19 LAB — BASIC METABOLIC PANEL
BUN/Creatinine Ratio: 8 — ABNORMAL LOW (ref 9–23)
BUN: 9 mg/dL (ref 6–24)
CO2: 24 mmol/L (ref 20–29)
Calcium: 10.4 mg/dL — ABNORMAL HIGH (ref 8.7–10.2)
Chloride: 99 mmol/L (ref 96–106)
Creatinine, Ser: 1.09 mg/dL — ABNORMAL HIGH (ref 0.57–1.00)
Glucose: 117 mg/dL — ABNORMAL HIGH (ref 70–99)
Potassium: 4 mmol/L (ref 3.5–5.2)
Sodium: 140 mmol/L (ref 134–144)
eGFR: 62 mL/min/{1.73_m2} (ref >59)

## 2022-08-19 NOTE — Telephone Encounter (Signed)
I discussed bmet result with. Mildly elevated creatinine and calcium level. May be due to dehydration. Hydration encouraged. Repeat lab in a few weeks. She agreed with the plan.  PPD report discussed. She denies exposure or travel. She sees Dr. Jena Gauss next Monday and will get quantiferone gold done at the visit. She mentioned weight loss and excessive sweating. She will discuss further with Dr. Jena Gauss when she comes in.

## 2022-08-19 NOTE — Telephone Encounter (Signed)
Unable to reach patient or leave a message.  Please advise when she calls.   Ask/confirm any recent exposure to someone with TB or had a recent travel out of the country? Sometimes, PPD can be false positive.  I want her to get Quantiferone gold test done before obtaining chest xray.  She can hold off on chest xray for now. Please, help her schedule a lab appointment.  Thanks.

## 2022-08-22 ENCOUNTER — Ambulatory Visit: Payer: Self-pay | Admitting: Student

## 2022-08-22 ENCOUNTER — Other Ambulatory Visit: Payer: Self-pay

## 2022-08-22 NOTE — Telephone Encounter (Signed)
Message routed to PCP and covering physician Dr. Ginger Carne. Aquilla Solian, CMA

## 2022-08-28 ENCOUNTER — Other Ambulatory Visit: Payer: Self-pay | Admitting: Family Medicine

## 2022-08-31 ENCOUNTER — Other Ambulatory Visit: Payer: Self-pay | Admitting: Family Medicine

## 2022-09-01 ENCOUNTER — Other Ambulatory Visit: Payer: Self-pay | Admitting: Family Medicine

## 2022-09-01 MED ORDER — IBUPROFEN 400 MG PO TABS: 400.0000 mg | ORAL_TABLET | Freq: Two times a day (BID) | ORAL | 0 refills | Status: DC | PRN

## 2022-09-06 ENCOUNTER — Other Ambulatory Visit (HOSPITAL_COMMUNITY)
Admission: RE | Admit: 2022-09-06 | Discharge: 2022-09-06 | Disposition: A | Discharge status: Home / Self Care | Payer: Medicare Other | Source: Ambulatory Visit | Attending: Family Medicine | Admitting: Family Medicine

## 2022-09-06 ENCOUNTER — Ambulatory Visit (INDEPENDENT_AMBULATORY_CARE_PROVIDER_SITE_OTHER): Payer: Medicare Other | Admitting: Family Medicine

## 2022-09-06 ENCOUNTER — Encounter: Payer: Self-pay | Admitting: Family Medicine

## 2022-09-06 VITALS — BP 146/95 | HR 85 | Ht 66.0 in | Wt 209.4 lb

## 2022-09-06 DIAGNOSIS — N912 Amenorrhea, unspecified: Secondary | ICD-10-CM

## 2022-09-06 DIAGNOSIS — Z114 Encounter for screening for human immunodeficiency virus [HIV]: Secondary | ICD-10-CM | POA: Diagnosis not present

## 2022-09-06 DIAGNOSIS — Z124 Encounter for screening for malignant neoplasm of cervix: Secondary | ICD-10-CM | POA: Insufficient documentation

## 2022-09-06 DIAGNOSIS — Z01419 Encounter for gynecological examination (general) (routine) without abnormal findings: Secondary | ICD-10-CM | POA: Insufficient documentation

## 2022-09-06 DIAGNOSIS — R7611 Nonspecific reaction to tuberculin skin test without active tuberculosis: Secondary | ICD-10-CM

## 2022-09-06 DIAGNOSIS — Z1151 Encounter for screening for human papillomavirus (HPV): Secondary | ICD-10-CM | POA: Diagnosis not present

## 2022-09-06 DIAGNOSIS — Z0000 Encounter for general adult medical examination without abnormal findings: Secondary | ICD-10-CM

## 2022-09-06 DIAGNOSIS — Z113 Encounter for screening for infections with a predominantly sexual mode of transmission: Secondary | ICD-10-CM | POA: Diagnosis not present

## 2022-09-06 DIAGNOSIS — I10 Essential (primary) hypertension: Secondary | ICD-10-CM

## 2022-09-06 DIAGNOSIS — Z1159 Encounter for screening for other viral diseases: Secondary | ICD-10-CM | POA: Diagnosis not present

## 2022-09-06 LAB — POCT URINE PREGNANCY: Preg Test, Ur: NEGATIVE

## 2022-09-06 NOTE — Progress Notes (Addendum)
Patient ID: Lisa Newton, female   DOB: 26-Apr-1971, 51 y.o.   MRN: [id number]    SUBJECTIVE:   Chief compliant/HPI: annual examination  Lisa Newton is a 51 y.o. who presents today for an annual exam.  LMP: August - now experiencing intermittent hot flashes. Her Migraine improved. Concern about weight loss. She eats once daily. She just does not feel hungry. No other concerns.  Review of systems as in the body of hx.   Updated history tabs and problem list.   OBJECTIVE:   BP (!) 149/106   Pulse 85   Ht 5' 6"  (1.676 m)   Wt 209 lb 6.4 oz (95 kg)   LMP  (LMP Unknown)   SpO2 100%   BMI 33.80 kg/m   Physical Exam Vitals and nursing note reviewed. Exam conducted with a chaperone present Lisa Newton).  HENT:     Head: Normocephalic.     Right Ear: Tympanic membrane and ear canal normal.     Left Ear: Tympanic membrane and ear canal normal.  Eyes:     Extraocular Movements: Extraocular movements intact.     Pupils: Pupils are equal, round, and reactive to light.  Cardiovascular:     Rate and Rhythm: Normal rate.     Heart sounds: Normal heart sounds. No murmur heard. Pulmonary:     Effort: Pulmonary effort is normal. No respiratory distress.     Breath sounds: Normal breath sounds. No wheezing.  Abdominal:     General: Abdomen is flat. Bowel sounds are normal. There is no distension.     Palpations: Abdomen is soft. There is no mass.  Genitourinary:    General: Normal vulva.     Vagina: Normal.     Cervix: Normal.     Uterus: Normal.      Adnexa: Right adnexa normal.  Musculoskeletal:        General: No swelling.     Cervical back: Neck supple.  Skin:    General: Skin is warm.  Neurological:     General: No focal deficit present.     Mental Status: She is alert and oriented to person, place, and time.     Cranial Nerves: No cranial nerve deficit.     Sensory: No sensory deficit.   NB: Wearing hearing aid during this visit.   ASSESSMENT/PLAN:    No problem-specific Assessment & Plan notes found for this encounter.    Annual Examination  See AVS for age appropriate recommendations.   PHQ score 11, reviewed and discussed. Blood pressure reviewed and at goal: No.  The patient currently uses no medications for contraception.  Advanced directives discussed. She assigned her daughter as her health decision maker -Lisa Newton  Considered the following items based upon USPSTF recommendations: HIV testing: ordered Hepatitis C: ordered Hepatitis B: ordered Syphilis if at high risk: ordered GC/CT  ordered per request Lipid panel (nonfasting or fasting) discussed based upon AHA recommendations and ordered.   Reviewed risk factors for latent tuberculosis and  quantiferon gold ordered given recent positive PPD. Otherwise, no risk factor  Discussed family history, BRCA testing not indicated. Mammogram scheduled. Cervical cancer screening: prior Pap reviewed, repeat due in a few months. She requested repeat today and was completed Immunizations Declined flu shot and will schedule shingrix with pharmacy.   Upreg ordered for amenorrhea which was negative. This is likely premenopausal as discussed during the visit. I will contact her later with negative pregnancy test results.  BP elevated with improved  repeat BP. She will f/u in 4 weeks for reassessment. Continue home BP monitoring. Follow up in 1 year or sooner if indicated.    Andrena Mews, MD [place]

## 2022-09-06 NOTE — Progress Notes (Deleted)
    SUBJECTIVE:   CHIEF COMPLAINT / HPI:   ***  PERTINENT  PMH / PSH: ***  OBJECTIVE:   BP (!) 149/106   Pulse 85   Ht 5\' 6"  (1.676 m)   Wt 209 lb 6.4 oz (95 kg)   LMP  (LMP Unknown)   SpO2 100%   BMI 33.80 kg/m   ***  ASSESSMENT/PLAN:   No problem-specific Assessment & Plan notes found for this encounter.     Andrena Mews, MD [place]

## 2022-09-06 NOTE — Patient Instructions (Signed)
It was nice seeing your today. Your BP is elevated. We will continue same medications for now while we work on lifestyle modification. Return in 4 weeks for BP check. I will contact you soon with all test results.

## 2022-09-10 LAB — QUANTIFERON-TB GOLD PLUS
QuantiFERON Mitogen Value: >10 IU/mL
QuantiFERON Nil Value: 0.1 IU/mL
QuantiFERON TB1 Ag Value: 0.08 IU/mL
QuantiFERON TB2 Ag Value: 0.06 IU/mL
QuantiFERON-TB Gold Plus: NEGATIVE

## 2022-09-10 LAB — HIV ANTIBODY (ROUTINE TESTING W REFLEX): HIV Screen 4th Generation wRfx: NONREACTIVE

## 2022-09-10 LAB — MICROALBUMIN / CREATININE URINE RATIO
Creatinine, Urine: 197.4 mg/dL
Microalb/Creat Ratio: 6 mg/g creat (ref 0–29)
Microalbumin, Urine: 11 ug/mL

## 2022-09-10 LAB — LIPID PANEL
Chol/HDL Ratio: 3.8 ratio (ref 0.0–4.4)
Cholesterol, Total: 151 mg/dL (ref 100–199)
HDL: 40 mg/dL (ref >39)
LDL Chol Calc (NIH): 93 mg/dL (ref 0–99)
Triglycerides: 98 mg/dL (ref 0–149)
VLDL Cholesterol Cal: 18 mg/dL (ref 5–40)

## 2022-09-10 LAB — RPR W/REFLEX TO TREPSURE: RPR: NONREACTIVE

## 2022-09-10 LAB — T PALLIDUM ANTIBODY, EIA: T pallidum Antibody, EIA: NEGATIVE

## 2022-09-10 LAB — HEPATITIS B SURFACE ANTIBODY, QUANTITATIVE: Hepatitis B Surf Ab Quant: <3.1 m[IU]/mL — ABNORMAL LOW (ref >9.9)

## 2022-09-12 ENCOUNTER — Telehealth: Payer: Self-pay

## 2022-09-12 NOTE — Telephone Encounter (Signed)
Patient calls nurse line asking to discuss results from recent visit. Verified name and DOB. Advised patient of results per note from Dr. Gwendlyn Deutscher. Answered all questions. Patient has employment paperwork that needs to be completed by provider. Patient will bring paperwork to nurse visit on Wednesday morning.   FYI to PCP.   Talbot Grumbling, RN

## 2022-09-13 DIAGNOSIS — Z79899 Other long term (current) drug therapy: Secondary | ICD-10-CM | POA: Diagnosis not present

## 2022-09-13 DIAGNOSIS — R030 Elevated blood-pressure reading, without diagnosis of hypertension: Secondary | ICD-10-CM | POA: Diagnosis not present

## 2022-09-13 DIAGNOSIS — M545 Low back pain, unspecified: Secondary | ICD-10-CM | POA: Diagnosis not present

## 2022-09-13 LAB — CYTOLOGY - PAP
Chlamydia: NEGATIVE
Comment: NEGATIVE
Comment: NEGATIVE
Comment: NEGATIVE
Comment: NORMAL
Diagnosis: NEGATIVE
High risk HPV: NEGATIVE
Neisseria Gonorrhea: NEGATIVE
Trichomonas: NEGATIVE

## 2022-09-14 ENCOUNTER — Ambulatory Visit (INDEPENDENT_AMBULATORY_CARE_PROVIDER_SITE_OTHER): Payer: Medicare Other

## 2022-09-14 ENCOUNTER — Telehealth: Payer: Self-pay | Admitting: Family Medicine

## 2022-09-14 ENCOUNTER — Other Ambulatory Visit: Payer: Self-pay

## 2022-09-14 DIAGNOSIS — Z23 Encounter for immunization: Secondary | ICD-10-CM | POA: Diagnosis not present

## 2022-09-14 MED ORDER — ATORVASTATIN CALCIUM 40 MG PO TABS
40.0000 mg | ORAL_TABLET | Freq: Every day | ORAL | 1 refills | Status: DC
Start: 2022-09-14 — End: 2023-12-19

## 2022-09-14 NOTE — Telephone Encounter (Signed)
Patient dropped off form at front desk for Health Examination Certificate.  Verified that patient section of form has been completed.  Last DOS/WCC with PCP was 09/06/22.  Placed form in blue team folder to be completed by clinical staff.  Lisa Newton

## 2022-09-14 NOTE — Progress Notes (Signed)
Patient presents to nurse clinic for Hepatitis B vaccination. Verified with Dr. Gwendlyn Deutscher that patient would receive three dose series. Administered in LD, site unremarkable, tolerated injection well.   Offered flu vaccination to patient. Upon chart review, patient has documented latex allergy. Precepted with Dr. Gwendlyn Deutscher. Advised that patient schedule follow up visit with her in order to discuss flu vaccination. Patient will schedule follow up visit with Dr. Gwendlyn Deutscher if she wants to proceed with flu vaccination.   Provided patient with updated vaccination record. Scheduled patient for nurse visit for second Hep B vaccine.   Talbot Grumbling, RN

## 2022-09-16 NOTE — Telephone Encounter (Signed)
Clinical info completed on Work Physical  form.  Placed form in PCP's box for completion.    When form is completed, please route note to "RN Team" and place in wall pocket in front office.   Salvatore Marvel, CMA

## 2022-09-19 NOTE — Telephone Encounter (Signed)
Form completed and placed in RN's box 

## 2022-09-19 NOTE — Telephone Encounter (Signed)
Patient called and informed that forms are ready for pick up. Copy made and placed in batch scanning. Original placed at front desk for pick up.   Sanika Brosious C Danell Verno, RN  

## 2022-09-27 ENCOUNTER — Other Ambulatory Visit: Payer: Self-pay | Admitting: Family Medicine

## 2022-09-27 MED ORDER — IBUPROFEN 400 MG PO TABS: 400.0000 mg | ORAL_TABLET | Freq: Two times a day (BID) | ORAL | 1 refills | Status: DC | PRN

## 2022-10-07 ENCOUNTER — Other Ambulatory Visit: Payer: Self-pay | Admitting: Family Medicine

## 2022-10-11 DIAGNOSIS — R030 Elevated blood-pressure reading, without diagnosis of hypertension: Secondary | ICD-10-CM | POA: Diagnosis not present

## 2022-10-11 DIAGNOSIS — Z79899 Other long term (current) drug therapy: Secondary | ICD-10-CM | POA: Diagnosis not present

## 2022-10-11 DIAGNOSIS — M545 Low back pain, unspecified: Secondary | ICD-10-CM | POA: Diagnosis not present

## 2022-10-12 ENCOUNTER — Ambulatory Visit: Payer: Medicare Other

## 2022-10-15 ENCOUNTER — Other Ambulatory Visit: Payer: Self-pay | Admitting: Specialist

## 2022-10-17 NOTE — Telephone Encounter (Signed)
Prob better to go to [facility] or [facility]

## 2022-10-18 ENCOUNTER — Other Ambulatory Visit: Payer: Self-pay | Admitting: Specialist

## 2022-10-18 ENCOUNTER — Ambulatory Visit (INDEPENDENT_AMBULATORY_CARE_PROVIDER_SITE_OTHER): Payer: Medicare Other

## 2022-10-18 DIAGNOSIS — Z23 Encounter for immunization: Secondary | ICD-10-CM

## 2022-10-18 MED ORDER — GABAPENTIN 300 MG PO CAPS: 300.0000 mg | ORAL_CAPSULE | Freq: Three times a day (TID) | ORAL | 0 refills | Status: DC

## 2022-10-18 NOTE — Progress Notes (Signed)
Patient presents to nurse clinic for second Hep B vaccination. Administered in LD, site unremarkable, tolerated injection well.   Scheduled patient for last injection on 01/05/2023.  Talbot Grumbling, RN

## 2022-11-08 DIAGNOSIS — R030 Elevated blood-pressure reading, without diagnosis of hypertension: Secondary | ICD-10-CM | POA: Diagnosis not present

## 2022-11-08 DIAGNOSIS — M545 Low back pain, unspecified: Secondary | ICD-10-CM | POA: Diagnosis not present

## 2022-11-08 DIAGNOSIS — Z79899 Other long term (current) drug therapy: Secondary | ICD-10-CM | POA: Diagnosis not present

## 2022-11-16 ENCOUNTER — Other Ambulatory Visit: Payer: Self-pay | Admitting: Orthopedic Surgery

## 2022-12-07 DIAGNOSIS — M545 Low back pain, unspecified: Secondary | ICD-10-CM | POA: Diagnosis not present

## 2022-12-07 DIAGNOSIS — Z79899 Other long term (current) drug therapy: Secondary | ICD-10-CM | POA: Diagnosis not present

## 2022-12-07 DIAGNOSIS — R030 Elevated blood-pressure reading, without diagnosis of hypertension: Secondary | ICD-10-CM | POA: Diagnosis not present

## 2022-12-09 DIAGNOSIS — Z79899 Other long term (current) drug therapy: Secondary | ICD-10-CM | POA: Diagnosis not present

## 2022-12-19 ENCOUNTER — Other Ambulatory Visit: Payer: Self-pay | Admitting: Orthopedic Surgery

## 2022-12-19 ENCOUNTER — Other Ambulatory Visit: Payer: Self-pay | Admitting: Family Medicine

## 2022-12-19 ENCOUNTER — Other Ambulatory Visit: Payer: Self-pay

## 2022-12-19 MED ORDER — GABAPENTIN 300 MG PO CAPS: 300.0000 mg | ORAL_CAPSULE | Freq: Three times a day (TID) | ORAL | 0 refills | Status: DC

## 2022-12-19 MED ORDER — FLUTICASONE PROPIONATE 50 MCG/ACT NA SUSP
2.0000 | Freq: Every day | NASAL | 2 refills | Status: DC | PRN
  Filled 2022-12-19: qty 16, 30d supply, fill #0

## 2022-12-20 ENCOUNTER — Other Ambulatory Visit: Payer: Self-pay | Admitting: Family Medicine

## 2023-01-05 ENCOUNTER — Ambulatory Visit (INDEPENDENT_AMBULATORY_CARE_PROVIDER_SITE_OTHER): Payer: 59

## 2023-01-05 ENCOUNTER — Other Ambulatory Visit: Payer: Self-pay

## 2023-01-05 DIAGNOSIS — Z23 Encounter for immunization: Secondary | ICD-10-CM

## 2023-01-05 MED ORDER — HYDROXYZINE HCL 25 MG PO TABS: ORAL_TABLET | ORAL | 1 refills | Status: DC

## 2023-01-05 MED ORDER — LEVOCETIRIZINE DIHYDROCHLORIDE 5 MG PO TABS: ORAL_TABLET | ORAL | 1 refills | Status: DC

## 2023-01-06 ENCOUNTER — Other Ambulatory Visit: Payer: Self-pay

## 2023-01-06 NOTE — Progress Notes (Signed)
Patient presents to nurse clinic for 3rd Hep B vaccination. Administered in LD, site unremarkable, tolerated injection well.   Provided with updated immunization record.   Talbot Grumbling, RN

## 2023-01-10 ENCOUNTER — Telehealth: Payer: Self-pay

## 2023-01-10 ENCOUNTER — Other Ambulatory Visit: Payer: Self-pay | Admitting: Family Medicine

## 2023-01-10 ENCOUNTER — Other Ambulatory Visit: Payer: 59

## 2023-01-10 DIAGNOSIS — R030 Elevated blood-pressure reading, without diagnosis of hypertension: Secondary | ICD-10-CM | POA: Diagnosis not present

## 2023-01-10 DIAGNOSIS — M545 Low back pain, unspecified: Secondary | ICD-10-CM | POA: Diagnosis not present

## 2023-01-10 DIAGNOSIS — Z760 Encounter for issue of repeat prescription: Secondary | ICD-10-CM | POA: Diagnosis not present

## 2023-01-10 DIAGNOSIS — Z111 Encounter for screening for respiratory tuberculosis: Secondary | ICD-10-CM

## 2023-01-10 DIAGNOSIS — Z79899 Other long term (current) drug therapy: Secondary | ICD-10-CM | POA: Diagnosis not present

## 2023-01-10 NOTE — Telephone Encounter (Signed)
Patient scheduled for TB test this afternoon. 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 Advised that we would not be able to perform skin test due to history of positive result.   Patient is requesting order for Quantiferon Lab. Will forward to PCP for order.   Talbot Grumbling, RN

## 2023-01-10 NOTE — Telephone Encounter (Signed)
Called patient. Updated appointment to lab visit.   Thanks.   Talbot Grumbling, RN

## 2023-01-12 DIAGNOSIS — Z79899 Other long term (current) drug therapy: Secondary | ICD-10-CM | POA: Diagnosis not present

## 2023-01-15 LAB — QUANTIFERON-TB GOLD PLUS
QuantiFERON Mitogen Value: >10 IU/mL
QuantiFERON Nil Value: 0.25 IU/mL
QuantiFERON TB1 Ag Value: 0.2 IU/mL
QuantiFERON TB2 Ag Value: 0.08 IU/mL
QuantiFERON-TB Gold Plus: NEGATIVE

## 2023-01-20 ENCOUNTER — Encounter: Payer: Self-pay | Admitting: Family Medicine

## 2023-01-20 ENCOUNTER — Ambulatory Visit (INDEPENDENT_AMBULATORY_CARE_PROVIDER_SITE_OTHER): Payer: 59 | Admitting: Family Medicine

## 2023-01-20 ENCOUNTER — Other Ambulatory Visit: Payer: Self-pay | Admitting: Family Medicine

## 2023-01-20 ENCOUNTER — Encounter: Payer: Medicare Other | Admitting: Family Medicine

## 2023-01-20 VITALS — BP 134/89 | Ht 66.0 in | Wt 215.0 lb

## 2023-01-20 DIAGNOSIS — E079 Disorder of thyroid, unspecified: Secondary | ICD-10-CM

## 2023-01-20 DIAGNOSIS — Z0000 Encounter for general adult medical examination without abnormal findings: Secondary | ICD-10-CM

## 2023-01-20 DIAGNOSIS — I10 Essential (primary) hypertension: Secondary | ICD-10-CM

## 2023-01-20 DIAGNOSIS — W19XXXA Unspecified fall, initial encounter: Secondary | ICD-10-CM

## 2023-01-20 DIAGNOSIS — E038 Other specified hypothyroidism: Secondary | ICD-10-CM

## 2023-01-20 DIAGNOSIS — E785 Hyperlipidemia, unspecified: Secondary | ICD-10-CM

## 2023-01-20 DIAGNOSIS — R7309 Other abnormal glucose: Secondary | ICD-10-CM

## 2023-01-20 MED ORDER — FLUTICASONE PROPIONATE 50 MCG/ACT NA SUSP: 2.0000 | Freq: Every day | NASAL | 2 refills | Status: AC | PRN

## 2023-01-20 MED ORDER — LEVOCETIRIZINE DIHYDROCHLORIDE 5 MG PO TABS: ORAL_TABLET | ORAL | 1 refills | Status: DC

## 2023-01-20 NOTE — Assessment & Plan Note (Signed)
Stable. 

## 2023-01-20 NOTE — Patient Instructions (Signed)

## 2023-01-20 NOTE — Addendum Note (Signed)
Addended by: Andrena Mews T on: 01/20/2023 11:50 AM   Modules accepted: Orders

## 2023-01-20 NOTE — Assessment & Plan Note (Signed)
Future lab ordered for TSH check.

## 2023-01-20 NOTE — Progress Notes (Addendum)
[facility] Visit  Patient consented to have virtual visit and was identified by name and date of birth. Method of visit: Telephone  Encounter participants: Patient: Lisa Newton - located at Home Provider: Andrena Mews - located at Office Others (if applicable): N/A   Subjective:   Lisa Newton is a 52 y.o. female who presents for Medicare Annual (Subsequent) preventive examination.  Chronic neck pain 3/10   Review of Systems    Chronic neck pain. Unchanged from her baseline.        Objective:    Today's Vitals   01/20/23 1047 01/20/23 1103 01/20/23 1105  BP: 134/89    Weight: 215 lb (97.5 kg)    Height: 5\' 6"  (1.676 m)    PainSc: 3  3  3    PainLoc: Neck     Body mass index is 34.7 kg/m.     01/20/2023   10:48 AM 09/06/2022   10:15 AM 08/14/2022   10:33 AM 08/11/2022    8:58 AM 03/15/2022    9:07 AM 03/07/2022   11:22 AM 02/03/2022   10:38 PM  Advanced Directives  Does Patient Have a Medical Advance Directive? No No No No No No No  Would patient like information on creating a medical advance directive? No - Patient declined No - Patient declined No - Patient declined No - Patient declined No - Patient declined No - Patient declined No - Patient declined    Current Medications (verified) Outpatient Encounter Medications as of 01/20/2023  Medication Sig   atorvastatin (LIPITOR) 40 MG tablet Take 1 tablet (40 mg total) by mouth daily.   DULoxetine (CYMBALTA) 30 MG capsule TAKE 1 CAPSULE(30 MG) BY MOUTH TWICE DAILY   gabapentin (NEURONTIN) 300 MG capsule Take 1 capsule (300 mg total) by mouth 3 (three) times daily.   levothyroxine (SYNTHROID) 75 MCG tablet TAKE 1 TABLET BY MOUTH DAILY BEFORE BREAKFAST.   losartan-hydrochlorothiazide (HYZAAR) 50-12.5 MG tablet TAKE 1 TABLET BY MOUTH EVERY DAY   Multiple Vitamins-Minerals (HAIR/SKIN/NAILS/BIOTIN PO) Take 2 each by mouth daily.   [DISCONTINUED] gabapentin (NEURONTIN) 300 MG  capsule Take 1 capsule (300 mg total) by mouth at bedtime for 3 days, THEN 1 capsule (300 mg total) 2 (two) times daily for 3 days, THEN 1 capsule (300 mg total) 3 (three) times daily for 24 days.   B Complex-C (SUPER B COMPLEX/VITAMIN C PO) Take by mouth daily.   fluticasone (FLONASE) 50 MCG/ACT nasal spray Place 2 sprays into both nostrils daily as needed for rhinitis or allergies.   hydrOXYzine (ATARAX) 25 MG tablet TAKE 1 TABLET(25 MG) BY MOUTH EVERY 6 HOURS AS NEEDED FOR ANXIETY (Patient not taking: Reported on 01/20/2023)   ibuprofen (ADVIL) 400 MG tablet TAKE 1 TABLET(400 MG) BY MOUTH EVERY 12 HOURS AS NEEDED (Patient not taking: Reported on 01/20/2023)   levocetirizine (XYZAL) 5 MG tablet TAKE 1 TABLET BY MOUTH EVERY DAY IN THE EVENING (Patient not taking: Reported on 01/20/2023)   oxyCODONE-acetaminophen (PERCOCET) 10-325 MG tablet Take 1 tablet by mouth 3 (three) times daily as needed. (Patient not taking: Reported on 01/20/2023)   SUMAtriptan (IMITREX) 50 MG tablet TAKE 1 TABLET BY MOUTH, MAY REPEAT IN 2 HOURS IF HEADACHE PERSISTS OR RECURS x ONCE. (Patient not taking: Reported on 01/20/2023)   [DISCONTINUED] loratadine (CLARITIN) 10 MG tablet Take 1 tablet (10 mg total) by mouth daily.   [DISCONTINUED] ondansetron (ZOFRAN-ODT) 4 MG disintegrating tablet Take 1 tablet (4 mg total) by  mouth every 8 (eight) hours as needed for nausea or vomiting. (Patient not taking: Reported on 01/20/2023)   [DISCONTINUED] ondansetron (ZOFRAN-ODT) disintegrating tablet 4 mg    No facility-administered encounter medications on file as of 01/20/2023.    Allergies (verified) Augmentin [amoxicillin-pot clavulanate], Bactrim [sulfamethoxazole-trimethoprim], Latex, Other, Pollen extract, and Tape   History: Past Medical History:  Diagnosis Date   Abnormal MRI, cervical spine (07/25/2018) 09/03/2018   Disc levels: C2-3: The disc appears normal. Stable mild bilateral facet hypertrophy. No spinal stenosis or nerve root  encroachment. C3-4: Stable mild uncinate spurring and facet hypertrophy. No spinal stenosis or nerve root encroachment. C4-5: Stable chronic spondylosis with posterior osteophytes covering diffusely bulging disc material. The AP diameter of the canal is chronically narrowed to 8 mm   Abnormal MRI, lumbar spine (07/25/2018) 09/03/2018   Disc levels: L4-5: The patient is status post right laminotomy for discectomy. There is some loss of disc space height. Fat about the descending right L4 root is obliterated in the subarticular recess. This is likely due to the presence of granulation tissue. No definite mass effect on the root is seen and there is no mass effect on the thecal sac. Foramina are open. 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   Herniation of cervical intervertebral disc with radiculopathy 03/14/2017   Hyperlipidemia    Hypertension    Hypothyroidism    Issue of medical certificate for disability examination 10/25/2013   Left ear hearing loss    Lumbar radiculitis (Bilateral) 08/06/2018   Neck pain    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   Parotitis, acute 06/11/2018   PONV (postoperative nausea and vomiting)    Poor social situation 09/13/2017   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/2018   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   Past Surgical History:  Procedure Laterality Date   ANTERIOR CERVICAL DECOMP/DISCECTOMY FUSION N/A 05/05/2021   Procedure: Anterior Cervical Decompression Fusion  - Cervical six-Cervical seven;   Surgeon: Ashok Pall, MD;  Location: [facility];  Service: Neurosurgery;  Laterality: N/A;   cholesterol granuloma     left ear   left ear surgery     ruptured TM   LUMBAR LAMINECTOMY/DECOMPRESSION MICRODISCECTOMY Right 12/14/2017   Procedure: MICRODISCECTOMY LUMBAR FOUR- LUMBAR FIVE - RIGHT;  Surgeon: Ashok Pall, MD;  Location: [facility];  Service: Neurosurgery;  Laterality: Right;  MICRODISCECTOMY LUMBAR 4- LUMBAR 5 - RIGHT   POSTERIOR CERVICAL FUSION/FORAMINOTOMY N/A 03/14/2017   Procedure: LEFT C6-7 FORAMINOTOMY WITH EXCISION OF HERNIATED NUCLEUS PULPOSUS;  Surgeon: Jessy Oto, MD;  Location: [facility];  Service: Orthopedics;  Laterality: N/A;   Family History  Problem Relation Age of Onset   Lung cancer Mother 1   Diabetes Father    Hypertension Father    Thyroid disease Sister    Thyroid disease Sister    Eczema Daughter    Stroke Daughter    Mental illness Son    Keloids Son    Asthma Neg Hx    Urticaria Neg Hx    Allergic rhinitis Neg Hx  Breast cancer Neg Hx    Colon cancer Neg Hx    Colon polyps Neg Hx    Esophageal cancer Neg Hx    Rectal cancer Neg Hx    Stomach cancer Neg Hx    Social History   Socioeconomic History   Marital status: Single    Spouse name: Not on file   Number of children: 3   Years of education: some college   Highest education level: 12th grade  Occupational History   Occupation: Disabled   Occupation: Animal nutritionist  Tobacco Use   Smoking status: Never    Passive exposure: Past (MOTHER)   Smokeless tobacco: Never  Vaping Use   Vaping Use: Never used  Substance and Sexual Activity   Alcohol use: Not Currently    Alcohol/week: 0.0 standard drinks of alcohol   Drug use: Not Currently    Frequency: 3.0 times per week    Types: Marijuana    Comment: Sometimes takes edibles - marijuan for pain   Sexual activity: Yes    Birth control/protection: None    Comment: last 03/31/22 - Pt reports no possibility of pregnancy  Other Topics  Concern   Not on file  Social History Narrative   Lives in [place] with her youngest daughter.    Patient has three children- they all live in the area.    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."        Social Determinants of Radio broadcast assistant Strain: High Risk (05/06/2022)   Overall Financial Resource Strain (CARDIA)    Difficulty of Paying Living Expenses: Very hard  Food Insecurity: Food Insecurity Present (01/20/2023)   Hunger Vital Sign    Worried About Running Out of Food in the Last Year: Sometimes true    Ran Out of Food in the Last Year: Never true  Transportation Needs: No Transportation Needs (01/20/2023)   PRAPARE - Hydrologist (Medical): No    Lack of Transportation (Non-Medical): No  Physical Activity: Sufficiently Active (01/20/2023)   Exercise Vital Sign    Days of Exercise per Week: 3 days    Minutes of Exercise per Session: 50 min  Stress: Stress Concern Present (05/06/2022)   [place]    Feeling of Stress : Very much  Social Connections: Moderately Isolated (05/06/2022)   Social Connection and Isolation Panel [NHANES]    Frequency of Communication with Friends and Family: More than three times a week    Frequency of Social Gatherings with Friends and Family: More than three times a week    Attends Religious Services: More than 4 times per year    Active Member of [employer] or Organizations: Patient refused    Attends Archivist Meetings: Never    Marital Status: Divorced    Tobacco Counseling Counseling given: Not Answered   Clinical Intake:  Pre-visit preparation completed: Yes  Pain :  (Neck pain - chronic) Pain Score: 3      Nutritional Status: BMI > 30  Obese Diabetes: No  How often do you need to have someone help you when you read instructions, pamphlets, or other written materials from your doctor or  pharmacy?: 1 - Never What is the last grade level you completed in school?: 12th grade  Diabetic?PreDM  Interpreter Needed?: No      Activities of Daily Living     No data to display  Functional Status Survey: Is the patient deaf or have difficulty hearing?: No Does the patient have difficulty seeing, even when wearing glasses/contacts?: No Does the patient have difficulty concentrating, remembering, or making decisions?: No Does the patient have difficulty walking or climbing stairs?: No Does the patient have difficulty dressing or bathing?: No Does the patient have difficulty doing errands alone such as visiting a doctor's office or shopping?: No   Patient Care Team: Kinnie Feil, MD as PCP - General (Family Medicine) Jessy Oto, MD (Inactive) as Consulting Physician (Orthopedic Surgery) Milinda Pointer, MD as Referring Physician (Pain Medicine)  Indicate any recent Medical Services you may have received from other than [facility] providers in the past year (date may be approximate).     Assessment:   This is a routine wellness examination for [place].  Hearing/Vision screen No results found.  Dietary issues and exercise activities discussed:     Goals Addressed               This Visit's Progress     Blood Pressure < 140/90 (pt-stated)        I want to get off my medicine and have a normal BP. Working on lifestyle modification       Depression Screen    01/20/2023   10:49 AM 09/06/2022   10:23 AM 08/11/2022    8:57 AM 05/06/2022   10:06 AM 03/15/2022    9:07 AM 03/07/2022   11:22 AM 01/21/2022    9:07 AM  PHQ 2/9 Scores  PHQ - 2 Score 0 2   4 6 6   PHQ- 9 Score 0 11   20 24 21   Exception Documentation   Patient refusal         Information is confidential and restricted. Go to Review Flowsheets to unlock data.    Fall Risk    01/20/2023   11:21 AM 01/18/2022   10:42 AM 11/29/2019    2:57 PM 04/25/2019   10:16 AM 04/11/2019    2:25 PM  Fall  Risk   Falls in the past year? 1 0 0  0  Number falls in past yr: 0 0 0 0 0  Comment She fell while trying to get up in the shower. She hit the back of head. She used to have a walk in shower, but now a step in shower      Injury with Fall? 0 0 0 0 0  Risk for fall due to :  Orthopedic patient     Follow up  Falls prevention discussed  Falls evaluation completed     [place]:  Any stairs in or around the home? Yes  If so, are there any without handrails? Yes  Home free of loose throw rugs in walkways, pet beds, electrical cords, etc? No  Adequate lighting in your home to reduce risk of falls? Yes   ASSISTIVE DEVICES UTILIZED TO PREVENT FALLS:  Life alert? No  Use of a cane, walker or w/c? No  Grab bars in the bathroom? No  Shower chair or bench in shower? No  Elevated toilet seat or a handicapped toilet? No   TIMED UP AND GO:  Was the test performed? No .  Length of time to ambulate 10 feet: N/A sec.    Cognitive Function:        01/20/2023   11:18 AM 01/18/2022   10:46 AM  6CIT Screen  What Year? 0 points 0 points  What month? 0 points 0 points  What time? 0 points 0 points  Count back from 20 0 points 0 points  Months in reverse 2 points 0 points  Repeat phrase 0 points 0 points  Total Score 2 points 0 points    Immunizations Immunization History  Administered Date(s) Administered   Hepatitis B, adult 09/14/2022, 10/18/2022, 01/05/2023   Influenza Split 01/11/2012   Influenza,inj,Quad PF,6+ Mos 10/25/2013, 12/03/2014, 10/06/2015   PPD Test 05/20/2011, 03/18/2014, 08/07/2019, 02/10/2020, 08/16/2022   Td 03/12/2004   Tdap 05/08/2018    TDAP status: Up to date  Flu Vaccine status: Due, Education has been provided regarding the importance of this vaccine. Advised may receive this vaccine at local pharmacy or Health Dept. Aware to provide a copy of the vaccination record if obtained from local pharmacy or Health Dept. Verbalized  acceptance and understanding.  Pneumococcal vaccine status: Up to date because it is not indicated.  Covid-19 vaccine status: Declined, Education has been provided regarding the importance of this vaccine but patient still declined. Advised may receive this vaccine at local pharmacy or Health Dept.or vaccine clinic. Aware to provide a copy of the vaccination record if obtained from local pharmacy or Health Dept. Verbalized acceptance and understanding.  Qualifies for Shingles Vaccine? Yes   Zostavax completed No   Shingrix Completed?: No.    Education has been provided regarding the importance of this vaccine. Patient has been advised to call insurance company to determine out of pocket expense if they have not yet received this vaccine. Advised may also receive vaccine at local pharmacy or Health Dept. Verbalized acceptance and understanding.  Screening Tests Health Maintenance  Topic Date Due   Zoster Vaccines- Shingrix (1 of 2) Never done   MAMMOGRAM  06/10/2022   Medicare Annual Wellness (AWV)  01/18/2023   INFLUENZA VACCINE  09/12/2023 (Originally 07/12/2022)   COVID-19 Vaccine (1) 09/12/2023 (Originally 01/03/1976)   PAP SMEAR-Modifier  09/07/2027   DTaP/Tdap/Td (3 - Td or Tdap) 05/08/2028   COLONOSCOPY (Pts 45-88yrs Insurance coverage will need to be confirmed)  04/20/2032   Hepatitis C Screening  Completed   HIV Screening  Completed   HPV VACCINES  Aged Out    Health Maintenance  Health Maintenance Due  Topic Date Due   Zoster Vaccines- Shingrix (1 of 2) Never done   MAMMOGRAM  06/10/2022   Medicare Annual Wellness (AWV)  01/18/2023    Colorectal cancer screening: Type of screening: Colonoscopy. Completed 2023. Repeat every 10 years. However, due to inadequate bowel prep, she is required to repeat colon cancer screen as soon as possible. She is advised to schedule follow up with gastroenterologist.  Mammogram status: Completed Dec/2022. Repeat every year 1-2 years.  Appointment scheduled for March of 2024.  Bone density: Not indicated  Lung Cancer Screening: (Low Dose CT Chest recommended if Age 73-80 years, 30 pack-year currently smoking OR have quit w/in 15years.) does not qualify.   Lung Cancer Screening Referral: N/A  Additional Screening:  Hepatitis C Screening: does qualify; Completed 2020  Vision Screening: Recommended annual ophthalmology exams for early detection of glaucoma and other disorders of the eye. Is the patient up to date with their annual eye exam?  Yes  Who is the provider or what is the name of the office in which the patient attends annual eye exams? Dr. [facility] If pt is not established with a provider, would they like to be referred to a provider to establish care? No .  Dental Screening: Recommended annual dental exams for proper oral hygiene  Community Resource Referral / Chronic Care Management: CRR required this visit?  No   CCM required this visit?  No      Plan:     I have personally reviewed and noted the following in the patient's chart:   Medical and social history Use of alcohol, tobacco or illicit drugs  Current medications and supplements including opioid prescriptions. Patient is currently taking opioid prescriptions. Information provided to patient regarding non-opioid alternatives. Patient advised to discuss non-opioid treatment plan with their provider. Functional ability and status Nutritional status Physical activity Advanced directives List of other physicians Hospitalizations, surgeries, and ER visits in previous 12 months Vitals Screenings to include cognitive, depression, and falls Referrals and appointments  In addition, I have reviewed and discussed with patient certain preventive protocols, quality metrics, and best practice recommendations. A written personalized care plan for preventive services as well as general preventive health recommendations were provided to  patient.   Shower stool ordered for shower fall prevention.  She is due for lab work. She plan on calling back to schedule lab appointment. Future lab order placed.   Time spent during visit with patient: 30 minutes   Andrena Mews, MD   01/20/2023

## 2023-01-27 ENCOUNTER — Encounter: Payer: 59 | Admitting: Family Medicine

## 2023-02-07 DIAGNOSIS — Z79899 Other long term (current) drug therapy: Secondary | ICD-10-CM | POA: Diagnosis not present

## 2023-02-07 DIAGNOSIS — M545 Low back pain, unspecified: Secondary | ICD-10-CM | POA: Diagnosis not present

## 2023-02-07 DIAGNOSIS — R030 Elevated blood-pressure reading, without diagnosis of hypertension: Secondary | ICD-10-CM | POA: Diagnosis not present

## 2023-02-10 ENCOUNTER — Other Ambulatory Visit: Payer: Medicare Other

## 2023-02-10 DIAGNOSIS — Z79899 Other long term (current) drug therapy: Secondary | ICD-10-CM | POA: Diagnosis not present

## 2023-02-20 ENCOUNTER — Ambulatory Visit (HOSPITAL_COMMUNITY)
Admission: RE | Admit: 2023-02-20 | Discharge: 2023-02-20 | Disposition: A | Discharge status: Home / Self Care | Payer: 59 | Source: Ambulatory Visit | Attending: Nurse Practitioner | Admitting: Nurse Practitioner

## 2023-02-20 ENCOUNTER — Encounter (HOSPITAL_COMMUNITY): Payer: Self-pay

## 2023-02-20 VITALS — BP 157/102 | HR 98 | Temp 99.7°F | Resp 20

## 2023-02-20 DIAGNOSIS — Z1152 Encounter for screening for COVID-19: Secondary | ICD-10-CM | POA: Diagnosis not present

## 2023-02-20 DIAGNOSIS — J069 Acute upper respiratory infection, unspecified: Secondary | ICD-10-CM | POA: Diagnosis not present

## 2023-02-20 DIAGNOSIS — J039 Acute tonsillitis, unspecified: Secondary | ICD-10-CM | POA: Diagnosis not present

## 2023-02-20 LAB — POCT RAPID STREP A, ED / UC: Streptococcus, Group A Screen (Direct): NEGATIVE

## 2023-02-20 LAB — SARS CORONAVIRUS 2 (TAT 6-24 HRS): SARS Coronavirus 2: NEGATIVE

## 2023-02-20 MED ORDER — BENZONATATE 100 MG PO CAPS: 100.0000 mg | ORAL_CAPSULE | Freq: Three times a day (TID) | ORAL | 0 refills | Status: DC | PRN

## 2023-02-20 MED ORDER — ONDANSETRON 4 MG PO TBDP: 4.0000 mg | ORAL_TABLET | Freq: Three times a day (TID) | ORAL | 0 refills | Status: DC | PRN

## 2023-02-20 MED ORDER — KETOROLAC TROMETHAMINE 30 MG/ML IJ SOLN
INTRAMUSCULAR | Status: AC
  Filled 2023-02-20: qty 1

## 2023-02-20 MED ORDER — LIDOCAINE VISCOUS HCL 2 % MT SOLN: 15.0000 mL | OROMUCOSAL | 0 refills | Status: DC | PRN

## 2023-02-20 MED ORDER — KETOROLAC TROMETHAMINE 30 MG/ML IJ SOLN
30.0000 mg | Freq: Once | INTRAMUSCULAR | Status: AC
  Administered 2023-02-20: 30 mg via INTRAMUSCULAR

## 2023-02-20 NOTE — ED Triage Notes (Signed)
Patient reports that she has had nasal congestion, sore throat and when she gets the congestion out of her throat she has blood present. Patient also c/o fatigue and a headache x 3 days.  Patient states she has taken sudafed, Mucinex and Percocet. The lst dose was last night.  Patient also reports that she has had 3 back surgeries and is having back pain. Patient states she goes to pain management and is prescribed Percocet, but it is not helping.
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
## 2023-02-20 NOTE — Discharge Instructions (Addendum)
You have a viral upper respiratory infection.  Symptoms should improve over the next week to 10 days.  If you develop chest pain or shortness of breath, go to the emergency room.  We have given you a shot of Toradol for body aches/pain you are having.  Do not take other NSAIDs for 48 hours.  The strep throat test today is negative.  We are sending for a throat culture and will call you in a couple of days if this is positive.  In the meantime, start lidocaine gargle and spit to help with the sore throat.  You can also take cepacol lozenges to help numb your throat.    We have tested you today for COVID-19.  You will see the results in Mychart and we will call you with positive results.  Please stay home and isolate until you are aware of the results.    Some things that can make you feel better are: - Increased rest - Increasing fluid with water/sugar free electrolytes - Acetaminophen as needed for fever/pain - Salt water gargling, chloraseptic spray and throat lozenges - OTC guaifenesin (Mucinex) 600 mg twice daily for congestion - Saline sinus flushes or a neti pot for congestion - Humidifying the air -Tessalon Perles every 8 hours as needed for dry cough  - Zofran ODT every 8 hours as needed for nausea/vomiting  If symptoms worsen despite treatment, seek care in ER.

## 2023-02-21 ENCOUNTER — Other Ambulatory Visit: Payer: Self-pay | Admitting: Family Medicine

## 2023-02-23 ENCOUNTER — Telehealth (HOSPITAL_COMMUNITY): Payer: Self-pay | Admitting: Emergency Medicine

## 2023-02-23 ENCOUNTER — Other Ambulatory Visit: Payer: Self-pay | Admitting: Nurse Practitioner

## 2023-02-23 LAB — CULTURE, GROUP A STREP (THRC)

## 2023-02-23 NOTE — Telephone Encounter (Signed)
Late entry.  10:30 am received message that patient had called and unable to get there medicine from pharmacy due to instructions incomplete.  Spoke to [employer] on [address] twice .  Spoke to patient x 1 verifying identity with 2 identifiers.    Spoke to Dr Arlis Porta.  Instructions for lidocaine reviewed by Dr Arlis Porta.    Lidocaine swish and spit 15 mls.  Use QID, PRN.  Pharmacist at [facility] accepting of order and discontinued call

## 2023-02-24 ENCOUNTER — Telehealth (HOSPITAL_COMMUNITY): Payer: Self-pay | Admitting: Emergency Medicine

## 2023-02-24 MED ORDER — AZITHROMYCIN 250 MG PO TABS: 250.0000 mg | ORAL_TABLET | Freq: Every day | ORAL | 0 refills | Status: DC

## 2023-02-28 ENCOUNTER — Ambulatory Visit (INDEPENDENT_AMBULATORY_CARE_PROVIDER_SITE_OTHER): Payer: 59 | Admitting: Family Medicine

## 2023-02-28 ENCOUNTER — Other Ambulatory Visit: Payer: Self-pay

## 2023-02-28 ENCOUNTER — Encounter: Payer: Self-pay | Admitting: Family Medicine

## 2023-02-28 VITALS — BP 164/89 | HR 70 | Ht 66.0 in | Wt 221.2 lb

## 2023-02-28 DIAGNOSIS — R7309 Other abnormal glucose: Secondary | ICD-10-CM

## 2023-02-28 DIAGNOSIS — I10 Essential (primary) hypertension: Secondary | ICD-10-CM

## 2023-02-28 DIAGNOSIS — M961 Postlaminectomy syndrome, not elsewhere classified: Secondary | ICD-10-CM

## 2023-02-28 DIAGNOSIS — H70209 Unspecified petrositis, unspecified ear: Secondary | ICD-10-CM

## 2023-02-28 DIAGNOSIS — E785 Hyperlipidemia, unspecified: Secondary | ICD-10-CM | POA: Diagnosis not present

## 2023-02-28 DIAGNOSIS — H9209 Otalgia, unspecified ear: Secondary | ICD-10-CM | POA: Diagnosis not present

## 2023-02-28 DIAGNOSIS — E039 Hypothyroidism, unspecified: Secondary | ICD-10-CM | POA: Diagnosis not present

## 2023-02-28 DIAGNOSIS — E038 Other specified hypothyroidism: Secondary | ICD-10-CM

## 2023-02-28 DIAGNOSIS — J302 Other seasonal allergic rhinitis: Secondary | ICD-10-CM | POA: Diagnosis not present

## 2023-02-28 DIAGNOSIS — R7303 Prediabetes: Secondary | ICD-10-CM | POA: Diagnosis not present

## 2023-02-28 DIAGNOSIS — Z760 Encounter for issue of repeat prescription: Secondary | ICD-10-CM | POA: Diagnosis not present

## 2023-02-28 MED ORDER — SUMATRIPTAN SUCCINATE 50 MG PO TABS: ORAL_TABLET | ORAL | 1 refills | Status: AC

## 2023-02-28 MED ORDER — LOSARTAN POTASSIUM 100 MG PO TABS: 100.0000 mg | ORAL_TABLET | Freq: Every day | ORAL | 1 refills | Status: DC

## 2023-02-28 MED ORDER — MONTELUKAST SODIUM 10 MG PO TABS: 10.0000 mg | ORAL_TABLET | Freq: Every day | ORAL | 1 refills | Status: DC

## 2023-02-28 MED ORDER — HYDROXYZINE HCL 25 MG PO TABS: 25.0000 mg | ORAL_TABLET | Freq: Three times a day (TID) | ORAL | 1 refills | Status: DC | PRN

## 2023-02-28 MED ORDER — AZELASTINE-FLUTICASONE 137-50 MCG/ACT NA SUSP: 1.0000 | Freq: Two times a day (BID) | NASAL | 1 refills | Status: AC

## 2023-02-28 MED ORDER — HYDROCHLOROTHIAZIDE 12.5 MG PO TABS: 12.5000 mg | ORAL_TABLET | Freq: Every day | ORAL | 1 refills | Status: DC

## 2023-02-28 MED ORDER — LEVOCETIRIZINE DIHYDROCHLORIDE 5 MG PO TABS: ORAL_TABLET | ORAL | 1 refills | Status: DC

## 2023-02-28 NOTE — Assessment & Plan Note (Addendum)
Hx of left ear petrositis which was adequately treated by ENT. Now having a similar presentation. Ear exam is benign. CT head/temporal done in 2016 showed - See report below Plan to obtain MRI temporal/head. I will refer to ENT depending on her test results.  CT impression: Prior mastoidectomy on the left. Mastoidectomy cavity is filled with fluid or soft tissue. This may represent recurrent cholesteatoma or infection. There is a left mastoid sinus effusion and middle ear effusion on the left.   Expansile mass in the left petrous apex. There is surrounding bony sclerosis and fragmentation of bone. The cavity has eroded into the internal auditory canal. 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 Differential diagnosis includes chondrosarcoma, cholesterol granuloma, cholesteatoma. Chordoma not considered likely given the lack of involvement of the clivus.   MRI of the brain without and with contrast with particular attention to the temporal bone ( IAC protocol) may be helpful for further characterization and to exclude intracranial extension.

## 2023-02-28 NOTE — Progress Notes (Addendum)
SUBJECTIVE:   CHIEF COMPLAINT / HPI:   Allergy concern: She c/o worsening allergic symptoms. Her symptoms are a stuffy nose, runny and itchy eyes, and intermittent headache. In the past, she used Singulair, Zyrtec, Xyzal, and Flonase with no improvement. Her symptoms are all year round but worse during the spring season. She was recently seen at the [facility] for strep through and is completing Zithromax today. Otherwise, no new concerns.  Back pain: This is chronic with radiation down her right LL. No LL weakness.   Ear pain: C/O B/L ear pain worse on the left. Occasionally associated with ear discharge. Pain is similar to her Petrositis symptoms. She requested MRI to further evaluate.  Hypothyroidism: Here for follow up. Compliant with her Synthroid.  HTN/PreDM/HLD: She is compliant with Hyzaar 50/12.5 mg QD. She is here for follow up.    PERTINENT  PMH / PSH: PMHx reviewed  OBJECTIVE:   BP (!) 164/89   Pulse 70   Ht 5\' 6"  (1.676 m)   Wt 221 lb 3.2 oz (100.3 kg)   LMP 11/25/2022   SpO2 100%   BMI 35.70 kg/m   Physical Exam Vitals and nursing note reviewed.  Cardiovascular:     Rate and Rhythm: Normal rate and regular rhythm.     Pulses: Normal pulses.     Heart sounds: Normal heart sounds. No murmur heard. Pulmonary:     Effort: Pulmonary effort is normal. No respiratory distress.     Breath sounds: Normal breath sounds. No wheezing.  Musculoskeletal:     Cervical back: No deformity. Normal range of motion.     Thoracic back: No deformity. Normal range of motion.     Lumbar back: Tenderness present. No deformity. Normal range of motion.  Neurological:     General: No focal deficit present.     Mental Status: She is alert.      ASSESSMENT/PLAN:   Seasonal allergies Her symptoms sounds perennial, although it is worse during the spring season. Refilled her Xyzal. Restart Singulair. Start Dymista and D/C Flonase Consider referral to an allergist for further  evaluation if there is no improvement. She agreed with the plan.  Lumbar postlaminectomy syndrome Chronic back pain. Status post right laminotomy at L4-5 for discectomy in 2019. No neurologic deficit. MRI spine report reviewed. PT recommended but she declined. F/U with her orthopedic specialist discussed. She agreed with the plan.  Petrositis Hx of left ear petrositis which was adequately treated by ENT. Now having a similar presentation. Ear exam is benign. CT head/temporal done in 2016 showed - See report below Plan to obtain MRI temporal/head. I will refer to ENT depending on her test results.  CT impression: Prior mastoidectomy on the left. Mastoidectomy cavity is filled with fluid or soft tissue. This may represent recurrent cholesteatoma or infection. There is a left mastoid sinus effusion and middle ear effusion on the left.   Expansile mass in the left petrous apex. There is surrounding bony sclerosis and fragmentation of bone. The cavity has eroded into the internal auditory canal. 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 Differential diagnosis includes chondrosarcoma, cholesterol granuloma, cholesteatoma. Chordoma not considered likely given the lack of involvement of the clivus.   MRI of the brain without and with contrast with particular attention to the temporal bone ( IAC protocol) may be helpful for further characterization and to exclude intracranial extension.   Subclinical hypothyroidism TSH ordered. I will contact her soon with her test  result.  Essential hypertension Poorly controlled on Hyzaar 50/12.5 mg. Changed to HCTZ 12.5 mg QD and increased Losartan to 100 mg QD. Monitor BP closely at home. F/U in 4 weeks for reassessment.     She requested refills for some of her medications.  Addendum MRI Report: I doubt acute ear infection given normal exam during this visit. She is ok with ENT  referral.  IMPRESSION: 1. Normal appearance of the brain itself. No abnormal leptomeningeal enhancement. 2. Chronic abnormality of the left temporal bone region related to previous mastoidectomy and chronic inflammatory changes at the petrous apex. Some increased T2 signal material present in that region could represent chronic changes of treated petrous apicitis, but I cannot rule out some element of active inflammation. I think this could be better evaluated with temporal bone CT. There does appear to be chronic distortion of the left internal auditory canal.    Andrena Mews, MD [place]

## 2023-02-28 NOTE — Assessment & Plan Note (Signed)
Her symptoms sounds perennial, although it is worse during the spring season. Refilled her Xyzal. Restart Singulair. Start Dymista and D/C Flonase Consider referral to an allergist for further evaluation if there is no improvement. She agreed with the plan.

## 2023-02-28 NOTE — Assessment & Plan Note (Addendum)
Chronic back pain. Status post right laminotomy at L4-5 for discectomy in 2019. No neurologic deficit. MRI spine report reviewed. PT recommended but she declined. F/U with her orthopedic specialist discussed. She agreed with the plan.

## 2023-02-28 NOTE — Patient Instructions (Signed)
Back Exercises These exercises help to make your trunk and back strong. They also help to keep the lower back flexible. Doing these exercises can help to prevent or lessen pain in your lower back. If you have back pain, try to do these exercises 2-3 times each day or as told by your doctor. As you get better, do the exercises once each day. Repeat the exercises more often as told by your doctor. To stop back pain from coming back, do the exercises once each day, or as told by your doctor. Do exercises exactly as told by your doctor. Stop right away if you feel sudden pain or your pain gets worse. Exercises Single knee to chest Do these steps 3-5 times in a row for each leg: Lie on your back on a firm bed or the floor with your legs stretched out. Bring one knee to your chest. Grab your knee or thigh with both hands and hold it in place. Pull on your knee until you feel a gentle stretch in your lower back or butt. Keep doing the stretch for 10-30 seconds. Slowly let go of your leg and straighten it. Pelvic tilt Do these steps 5-10 times in a row: Lie on your back on a firm bed or the floor with your legs stretched out. Bend your knees so they point up to the ceiling. Your feet should be flat on the floor. Tighten your lower belly (abdomen) muscles to press your lower back against the floor. This will make your tailbone point up to the ceiling instead of pointing down to your feet or the floor. Stay in this position for 5-10 seconds while you gently tighten your muscles and breathe evenly. Cat-cow Do these steps until your lower back bends more easily: Get on your hands and knees on a firm bed or the floor. Keep your hands under your shoulders, and keep your knees under your hips. You may put padding under your knees. Let your head hang down toward your chest. Tighten (contract) the muscles in your belly. Point your tailbone toward the floor so your lower back becomes rounded like the back of a  cat. Stay in this position for 5 seconds. Slowly lift your head. Let the muscles of your belly relax. Point your tailbone up toward the ceiling so your back forms a sagging arch like the back of a cow. Stay in this position for 5 seconds.  Press-ups Do these steps 5-10 times in a row: Lie on your belly (face-down) on a firm bed or the floor. Place your hands near your head, about shoulder-width apart. While you keep your back relaxed and keep your hips on the floor, slowly straighten your arms to raise the top half of your body and lift your shoulders. Do not use your back muscles. You may change where you place your hands to make yourself more comfortable. Stay in this position for 5 seconds. Keep your back relaxed. Slowly return to lying flat on the floor.  Bridges Do these steps 10 times in a row: Lie on your back on a firm bed or the floor. Bend your knees so they point up to the ceiling. Your feet should be flat on the floor. Your arms should be flat at your sides, next to your body. Tighten your butt muscles and lift your butt off the floor until your waist is almost as high as your knees. If you do not feel the muscles working in your butt and the back of   your thighs, slide your feet 1-2 inches (2.5-5 cm) farther away from your butt. Stay in this position for 3-5 seconds. Slowly lower your butt to the floor, and let your butt muscles relax. If this exercise is too easy, try doing it with your arms crossed over your chest. Belly crunches Do these steps 5-10 times in a row: Lie on your back on a firm bed or the floor with your legs stretched out. Bend your knees so they point up to the ceiling. Your feet should be flat on the floor. Cross your arms over your chest. Tip your chin a little bit toward your chest, but do not bend your neck. Tighten your belly muscles and slowly raise your chest just enough to lift your shoulder blades a tiny bit off the floor. Avoid raising your body  higher than that because it can put too much stress on your lower back. Slowly lower your chest and your head to the floor. Back lifts Do these steps 5-10 times in a row: Lie on your belly (face-down) with your arms at your sides, and rest your forehead on the floor. Tighten the muscles in your legs and your butt. Slowly lift your chest off the floor while you keep your hips on the floor. Keep the back of your head in line with the curve in your back. Look at the floor while you do this. Stay in this position for 3-5 seconds. Slowly lower your chest and your face to the floor. Contact a doctor if: Your back pain gets a lot worse when you do an exercise. Your back pain does not get better within 2 hours after you exercise. If you have any of these problems, stop doing the exercises. Do not do them again unless your doctor says it is okay. Get help right away if: You have sudden, very bad back pain. If this happens, stop doing the exercises. Do not do them again unless your doctor says it is okay. This information is not intended to replace advice given to you by your health care provider. Make sure you discuss any questions you have with your health care provider. Document Revised: 02/10/2021 Document Reviewed: 02/10/2021 Elsevier Patient Education  2023 [employer]  

## 2023-02-28 NOTE — Assessment & Plan Note (Signed)
Not taking her Statin. She want to recheck her lab to see her current LDL status. Lab ordered. I will f/u with her test result.

## 2023-02-28 NOTE — Assessment & Plan Note (Signed)
TSH ordered. I will contact her soon with her test result.

## 2023-02-28 NOTE — Assessment & Plan Note (Signed)
A1C ordered

## 2023-02-28 NOTE — Assessment & Plan Note (Signed)
Poorly controlled on Hyzaar 50/12.5 mg. Changed to HCTZ 12.5 mg QD and increased Losartan to 100 mg QD. Monitor BP closely at home. F/U in 4 weeks for reassessment.

## 2023-03-01 ENCOUNTER — Telehealth: Payer: Self-pay | Admitting: Family Medicine

## 2023-03-01 ENCOUNTER — Ambulatory Visit (INDEPENDENT_AMBULATORY_CARE_PROVIDER_SITE_OTHER): Payer: 59 | Admitting: Plastic Surgery

## 2023-03-01 ENCOUNTER — Encounter: Payer: Self-pay | Admitting: Plastic Surgery

## 2023-03-01 VITALS — BP 149/102 | HR 85

## 2023-03-01 DIAGNOSIS — L910 Hypertrophic scar: Secondary | ICD-10-CM

## 2023-03-01 LAB — LIPID PANEL
Chol/HDL Ratio: 6.3 ratio — ABNORMAL HIGH (ref 0.0–4.4)
Cholesterol, Total: 247 mg/dL — ABNORMAL HIGH (ref 100–199)
HDL: 39 mg/dL — ABNORMAL LOW (ref >39)
LDL Chol Calc (NIH): 191 mg/dL — ABNORMAL HIGH (ref 0–99)
Triglycerides: 97 mg/dL (ref 0–149)
VLDL Cholesterol Cal: 17 mg/dL (ref 5–40)

## 2023-03-01 LAB — BASIC METABOLIC PANEL
BUN/Creatinine Ratio: 10 (ref 9–23)
BUN: 8 mg/dL (ref 6–24)
CO2: 20 mmol/L (ref 20–29)
Calcium: 10.1 mg/dL (ref 8.7–10.2)
Chloride: 99 mmol/L (ref 96–106)
Creatinine, Ser: 0.8 mg/dL (ref 0.57–1.00)
Glucose: 94 mg/dL (ref 70–99)
Potassium: 3.9 mmol/L (ref 3.5–5.2)
Sodium: 137 mmol/L (ref 134–144)
eGFR: 89 mL/min/{1.73_m2} (ref >59)

## 2023-03-01 LAB — HEMOGLOBIN A1C
Est. average glucose Bld gHb Est-mCnc: 134 mg/dL
Hgb A1c MFr Bld: 6.3 % — ABNORMAL HIGH (ref 4.8–5.6)

## 2023-03-01 LAB — TSH: TSH: 2.01 u[IU]/mL (ref 0.450–4.500)

## 2023-03-01 NOTE — Progress Notes (Signed)
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  Patient has undergone injection of keloids in the past with good results still has a small 1 cm keloid which has not completely resolved.  She would like to have this injected  Description of Procedure: Risks and complications were explained to the patient including hypopigmentation and the need for additional injections..  Consent was confirmed and the patient understands the risks and benefits.  The potential complications and alternatives were explained and the patient consents.  The patient expressed understanding the option of not having the procedure and the risks of a scar.  Time out was called and all information was confirmed to be correct.    A solution of a 50-50 mix of Kenalog 40 and 1% plain lidocaine was prepared.  0.2 mL of the solution was injected into the keloid without difficulty.  Glenn tolerated the procedure well and there were no complications.  The patient was instructed on massage of the scar to improve the results.  She will return in 6 weeks if there is still any evidence of the keloid.

## 2023-03-01 NOTE — Telephone Encounter (Signed)
Result discussed. She is currently off her Statin. Patient agreed to resume her lipitor 40 mg QD today. A1C increased from 5.8 to 6.3 - still prediabetic range, but requires more caution. Continue lifestyle modification for now and recheck in 3 months. Consider initiating metformin if it remains elevated.  She agreed with the f/u plan.

## 2023-03-02 ENCOUNTER — Ambulatory Visit (HOSPITAL_BASED_OUTPATIENT_CLINIC_OR_DEPARTMENT_OTHER)
Admission: RE | Admit: 2023-03-02 | Discharge: 2023-03-02 | Disposition: A | Discharge status: Home / Self Care | Payer: 59 | Source: Ambulatory Visit | Attending: Family Medicine | Admitting: Family Medicine

## 2023-03-02 DIAGNOSIS — H70209 Unspecified petrositis, unspecified ear: Secondary | ICD-10-CM

## 2023-03-02 DIAGNOSIS — H9202 Otalgia, left ear: Secondary | ICD-10-CM | POA: Insufficient documentation

## 2023-03-02 DIAGNOSIS — R519 Headache, unspecified: Secondary | ICD-10-CM | POA: Diagnosis not present

## 2023-03-02 MED ORDER — GADOBUTROL 1 MMOL/ML IV SOLN
10.0000 mL | Freq: Once | INTRAVENOUS | Status: AC | PRN
  Administered 2023-03-02: 10 mL via INTRAVENOUS
  Filled 2023-03-02: qty 10

## 2023-03-06 ENCOUNTER — Encounter: Payer: Self-pay | Admitting: Family Medicine

## 2023-03-06 NOTE — Addendum Note (Signed)
Addended by: Andrena Mews T on: 03/06/2023 08:03 AM   Modules accepted: Orders

## 2023-03-08 ENCOUNTER — Other Ambulatory Visit: Payer: Self-pay | Admitting: Orthopedic Surgery

## 2023-03-08 ENCOUNTER — Other Ambulatory Visit (HOSPITAL_COMMUNITY): Payer: Self-pay

## 2023-03-08 MED ORDER — GABAPENTIN 300 MG PO CAPS
300.0000 mg | ORAL_CAPSULE | Freq: Three times a day (TID) | ORAL | 0 refills | Status: DC
  Filled 2023-03-08: qty 90, 30d supply, fill #0

## 2023-03-09 ENCOUNTER — Encounter: Payer: Self-pay | Admitting: Family Medicine

## 2023-03-09 ENCOUNTER — Other Ambulatory Visit (HOSPITAL_COMMUNITY): Payer: Self-pay

## 2023-03-09 NOTE — Telephone Encounter (Signed)
I called [facility] and cancelled the prescription.

## 2023-03-10 ENCOUNTER — Other Ambulatory Visit (HOSPITAL_COMMUNITY): Payer: Self-pay

## 2023-03-13 DIAGNOSIS — M545 Low back pain, unspecified: Secondary | ICD-10-CM | POA: Diagnosis not present

## 2023-03-13 DIAGNOSIS — Z79899 Other long term (current) drug therapy: Secondary | ICD-10-CM | POA: Diagnosis not present

## 2023-03-13 DIAGNOSIS — R030 Elevated blood-pressure reading, without diagnosis of hypertension: Secondary | ICD-10-CM | POA: Diagnosis not present

## 2023-03-15 DIAGNOSIS — Z79899 Other long term (current) drug therapy: Secondary | ICD-10-CM | POA: Diagnosis not present

## 2023-03-20 ENCOUNTER — Other Ambulatory Visit: Payer: Self-pay | Admitting: Family Medicine

## 2023-03-21 ENCOUNTER — Ambulatory Visit
Admission: RE | Admit: 2023-03-21 | Discharge: 2023-03-21 | Disposition: A | Discharge status: Home / Self Care | Payer: 59 | Source: Ambulatory Visit | Attending: Family Medicine | Admitting: Family Medicine

## 2023-03-21 ENCOUNTER — Ambulatory Visit
Admission: RE | Admit: 2023-03-21 | Discharge: 2023-03-21 | Disposition: A | Discharge status: Home / Self Care | Payer: Medicare Other | Source: Ambulatory Visit | Attending: Family Medicine | Admitting: Family Medicine

## 2023-03-21 DIAGNOSIS — N6310 Unspecified lump in the right breast, unspecified quadrant: Secondary | ICD-10-CM

## 2023-03-21 DIAGNOSIS — N644 Mastodynia: Secondary | ICD-10-CM | POA: Diagnosis not present

## 2023-04-11 DIAGNOSIS — M545 Low back pain, unspecified: Secondary | ICD-10-CM | POA: Diagnosis not present

## 2023-04-11 DIAGNOSIS — Z79899 Other long term (current) drug therapy: Secondary | ICD-10-CM | POA: Diagnosis not present

## 2023-04-13 DIAGNOSIS — Z79899 Other long term (current) drug therapy: Secondary | ICD-10-CM | POA: Diagnosis not present

## 2023-05-09 DIAGNOSIS — Z79899 Other long term (current) drug therapy: Secondary | ICD-10-CM | POA: Diagnosis not present

## 2023-05-09 DIAGNOSIS — M545 Low back pain, unspecified: Secondary | ICD-10-CM | POA: Diagnosis not present

## 2023-05-11 DIAGNOSIS — Z79899 Other long term (current) drug therapy: Secondary | ICD-10-CM | POA: Diagnosis not present

## 2023-06-13 DIAGNOSIS — S161XXA Strain of muscle, fascia and tendon at neck level, initial encounter: Secondary | ICD-10-CM | POA: Diagnosis not present

## 2023-06-13 DIAGNOSIS — M545 Low back pain, unspecified: Secondary | ICD-10-CM | POA: Diagnosis not present

## 2023-06-13 DIAGNOSIS — Z79899 Other long term (current) drug therapy: Secondary | ICD-10-CM | POA: Diagnosis not present

## 2023-06-13 DIAGNOSIS — R030 Elevated blood-pressure reading, without diagnosis of hypertension: Secondary | ICD-10-CM | POA: Diagnosis not present

## 2023-06-16 DIAGNOSIS — Z79899 Other long term (current) drug therapy: Secondary | ICD-10-CM | POA: Diagnosis not present

## 2023-07-31 ENCOUNTER — Encounter: Payer: Self-pay | Admitting: Pharmacist

## 2023-07-31 ENCOUNTER — Telehealth: Payer: Self-pay | Admitting: Pharmacist

## 2023-07-31 NOTE — Telephone Encounter (Signed)
Attempted to contact patient for follow-up of adherence check-in.  Sent My Chart message regarding atorvastatin. Patient has not read message yet.   Left HIPAA compliant voice mail requesting call back to direct phone: [phone]

## 2023-08-10 ENCOUNTER — Other Ambulatory Visit (HOSPITAL_COMMUNITY)
Admission: RE | Admit: 2023-08-10 | Discharge: 2023-08-10 | Disposition: A | Discharge status: Home / Self Care | Payer: 59 | Source: Ambulatory Visit | Attending: Family Medicine | Admitting: Family Medicine

## 2023-08-10 ENCOUNTER — Ambulatory Visit (INDEPENDENT_AMBULATORY_CARE_PROVIDER_SITE_OTHER): Payer: 59 | Admitting: Student

## 2023-08-10 ENCOUNTER — Other Ambulatory Visit: Payer: Self-pay

## 2023-08-10 VITALS — BP 165/96 | HR 65 | Ht 66.0 in | Wt 222.4 lb

## 2023-08-10 DIAGNOSIS — R059 Cough, unspecified: Secondary | ICD-10-CM

## 2023-08-10 DIAGNOSIS — Z7251 High risk heterosexual behavior: Secondary | ICD-10-CM | POA: Diagnosis not present

## 2023-08-10 DIAGNOSIS — I10 Essential (primary) hypertension: Secondary | ICD-10-CM | POA: Diagnosis not present

## 2023-08-10 DIAGNOSIS — L700 Acne vulgaris: Secondary | ICD-10-CM | POA: Diagnosis not present

## 2023-08-10 LAB — POCT URINE PREGNANCY: Preg Test, Ur: NEGATIVE

## 2023-08-10 LAB — POC SOFIA SARS ANTIGEN FIA: SARS Coronavirus 2 Ag: NEGATIVE

## 2023-08-10 MED ORDER — HYDROCHLOROTHIAZIDE 12.5 MG PO TABS: 12.5000 mg | ORAL_TABLET | Freq: Every day | ORAL | 1 refills | Status: DC

## 2023-08-10 MED ORDER — BENZONATATE 100 MG PO CAPS: 100.0000 mg | ORAL_CAPSULE | Freq: Two times a day (BID) | ORAL | 0 refills | Status: AC | PRN

## 2023-08-10 MED ORDER — LOSARTAN POTASSIUM 100 MG PO TABS: 100.0000 mg | ORAL_TABLET | Freq: Every day | ORAL | 1 refills | Status: DC

## 2023-08-10 MED ORDER — MONTELUKAST SODIUM 10 MG PO TABS: 10.0000 mg | ORAL_TABLET | Freq: Every day | ORAL | 1 refills | Status: DC

## 2023-08-10 MED ORDER — CLINDAMYCIN PHOS-BENZOYL PEROX 1-5 % EX GEL: Freq: Two times a day (BID) | CUTANEOUS | 0 refills | Status: DC

## 2023-08-10 NOTE — Assessment & Plan Note (Signed)
Elevated BP x 2 today.  Patient endorses noncompliance to her blood pressure medications.  Currently asymptomatic no signs of headache, chest pain or shortness of breath. -Refilled patient's losartan and HCTZ -Encourage patient to check daily blood pressure after restarting her medications and keeping BP logs -Follow-up in 2 weeks to reassess blood pressure.

## 2023-08-10 NOTE — Patient Instructions (Signed)
It was wonderful to meet you today. Thank you for allowing me to be a part of your care. Below is a short summary of what we discussed at your visit today:  Today we collected samples for STD testing to test for gonorrhea, chlamydia HIV and syphilis.  Your blood pressure today was elevated which I suspect is due to noncompliance to your blood pressure medication.  refilled your medication and you should be able to pick it up from your pharmacy.  Please make sure to keep a daily log of your blood pressures and follow-up with your PCP in 2 weeks.  Cough I suspect could be due to viral illness or allergy.  I am sending you medication for Tessalon Perle which is a has worked for you in the past.  Also I have refilled your Singulair which you should take at bedtime.  Please bring all of your medications to every appointment!  If you have any questions or concerns, please do not hesitate to contact us via phone or MyChart message.   Jerre Simon, MD [facility]

## 2023-08-10 NOTE — Progress Notes (Addendum)
    SUBJECTIVE:   CHIEF COMPLAINT / HPI:   52 y.o. female presents for routine STD testing.  She is sexually active with multiple partner and inconsistent with condom use. Endorses vaginal discharge in the last 3 days however no itchiness or odor.  No hematuria or recent change in medication.   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  She has been out of her medications.  Pressure today was elevated x 2.  PERTINENT  PMH / PSH: Reviewed  OBJECTIVE:   BP (!) 165/96   Pulse 65   Ht 5\' 6"  (1.676 m)   Wt 222 lb 6.4 oz (100.9 kg)   SpO2 100%   BMI 35.90 kg/m    Physical Exam General: Alert, well appearing, NAD, Oriented x4 Cardiovascular: RRR, No Murmurs, Normal S2/S2 Respiratory: CTAB, No wheezing or Rales Abdomen: No distension or tenderness Extremities: No edema on extremities   Skin: diffused papular erythematous rash on the face with hyperpigmented spot from healing lesions Genitalia:  Normal introitus for age, no external lesions, white copious vaginal discharge , mucosa pink and moist, no vaginal or cervical lesions,    Exam chaperoned by Dayshia CMA.  ASSESSMENT/PLAN:   Essential hypertension Elevated BP x 2 today.  Patient endorses noncompliance to her blood pressure medications.  Currently asymptomatic no signs of headache, chest pain or shortness of breath. -Refilled patient's losartan and HCTZ -Encourage patient to check daily blood pressure after restarting her medications and keeping BP logs -Follow-up in 2 weeks to reassess blood pressure.   Vaginal discharge She will recent unprotected sex also at high risk given multiple partners.  Will order labs for STD testing. -Ordered lab for  GC/chlamydia, HIV and RPR -Consu counseled patient on  -Declined PrEP  Cough Patient with history of seasonal allergies presenting with 2 weeks of cough. Suspect her symptoms are most likely allergic versus infectious given no signs of infectious process or other associated symptoms.  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 -Refilled patient's Singulair nightly -Rx Tessalon Perles to be used if symptoms does not improve after restarting Singulair.  Facial Acne Diffuse rash on the face consistent with acne with also hyperpigmented spot from healing lesions. -Recommend use of face wash that contains Benzoyl Peroxide (or Cerave/ Cetaphil face wash) BID -Use face toner (Witch Hazel) with cotton over the face -Rx BenzaClin to be used BID  Jerre Simon, MD [facility]

## 2023-08-11 LAB — HIV ANTIBODY (ROUTINE TESTING W REFLEX): HIV Screen 4th Generation wRfx: NONREACTIVE

## 2023-08-11 LAB — RPR: RPR Ser Ql: NONREACTIVE

## 2023-08-17 LAB — CERVICOVAGINAL ANCILLARY ONLY
Bacterial Vaginitis (gardnerella): NEGATIVE
Chlamydia: NEGATIVE
Comment: NEGATIVE
Comment: NEGATIVE
Comment: NEGATIVE
Comment: NORMAL
Neisseria Gonorrhea: NEGATIVE
Trichomonas: NEGATIVE

## 2023-09-06 ENCOUNTER — Other Ambulatory Visit: Payer: Self-pay

## 2023-09-06 NOTE — Progress Notes (Signed)
Lisa Newton 1971-10-10 [id number]  Patient attempted to be outreached by Thomasene Ripple, PharmD Candidate to discuss hypertension. Left voicemail for patient to return our call at their convenience at [phone].  Thomasene Ripple, Student-PharmD

## 2023-09-13 ENCOUNTER — Ambulatory Visit: Payer: 59 | Admitting: Family Medicine

## 2023-10-30 ENCOUNTER — Ambulatory Visit (HOSPITAL_COMMUNITY)
Admission: EM | Admit: 2023-10-30 | Discharge: 2023-10-30 | Disposition: A | Discharge status: Home / Self Care | Payer: 59 | Attending: Family Medicine | Admitting: Family Medicine

## 2023-10-30 ENCOUNTER — Encounter (HOSPITAL_COMMUNITY): Payer: Self-pay

## 2023-10-30 DIAGNOSIS — M549 Dorsalgia, unspecified: Secondary | ICD-10-CM | POA: Diagnosis not present

## 2023-10-30 DIAGNOSIS — I10 Essential (primary) hypertension: Secondary | ICD-10-CM

## 2023-10-30 DIAGNOSIS — R052 Subacute cough: Secondary | ICD-10-CM

## 2023-10-30 DIAGNOSIS — G8929 Other chronic pain: Secondary | ICD-10-CM | POA: Diagnosis not present

## 2023-10-30 DIAGNOSIS — M542 Cervicalgia: Secondary | ICD-10-CM

## 2023-10-30 MED ORDER — PROMETHAZINE-DM 6.25-15 MG/5ML PO SYRP: 5.0000 mL | ORAL_SOLUTION | Freq: Four times a day (QID) | ORAL | 0 refills | Status: DC | PRN

## 2023-10-30 MED ORDER — KETOROLAC TROMETHAMINE 30 MG/ML IJ SOLN
INTRAMUSCULAR | Status: AC
  Filled 2023-10-30: qty 1

## 2023-10-30 MED ORDER — DEXAMETHASONE SODIUM PHOSPHATE 10 MG/ML IJ SOLN
INTRAMUSCULAR | Status: AC
  Filled 2023-10-30: qty 1

## 2023-10-30 MED ORDER — DEXAMETHASONE SODIUM PHOSPHATE 10 MG/ML IJ SOLN
10.0000 mg | Freq: Once | INTRAMUSCULAR | Status: AC
  Administered 2023-10-30: 10 mg via INTRAMUSCULAR

## 2023-10-30 MED ORDER — KETOROLAC TROMETHAMINE 30 MG/ML IJ SOLN
30.0000 mg | Freq: Once | INTRAMUSCULAR | Status: AC
  Administered 2023-10-30: 30 mg via INTRAMUSCULAR

## 2023-10-30 MED ORDER — CYCLOBENZAPRINE HCL 10 MG PO TABS: 10.0000 mg | ORAL_TABLET | Freq: Three times a day (TID) | ORAL | 0 refills | Status: AC | PRN

## 2023-10-30 NOTE — Discharge Instructions (Addendum)
You received Toradol and Decadron injections for acute flare up of back and neck pain.  I have also prescribed the Flexeril take as directed and note that medication can cause drowsiness.  I have also prescribed Promethazine DM for your cough. Follow up with pain management and your primary care doctor as her blood pressure was elevated here today.
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
## 2023-10-30 NOTE — ED Triage Notes (Signed)
Patient here today with c/o neck and LB pain that started 2 days ago. Patient has h/o 3 spinal surgeries and stopped taking Oxycodone about 3 months ago and started taking THC gummies which seemed to have been helping until she stopped taking them about 2 weeks ago.

## 2023-11-01 DIAGNOSIS — E039 Hypothyroidism, unspecified: Secondary | ICD-10-CM | POA: Diagnosis not present

## 2023-11-01 DIAGNOSIS — M5412 Radiculopathy, cervical region: Secondary | ICD-10-CM | POA: Diagnosis not present

## 2023-11-01 DIAGNOSIS — Z79899 Other long term (current) drug therapy: Secondary | ICD-10-CM | POA: Diagnosis not present

## 2023-11-01 DIAGNOSIS — Z131 Encounter for screening for diabetes mellitus: Secondary | ICD-10-CM | POA: Diagnosis not present

## 2023-11-01 DIAGNOSIS — M545 Low back pain, unspecified: Secondary | ICD-10-CM | POA: Diagnosis not present

## 2023-11-01 DIAGNOSIS — I10 Essential (primary) hypertension: Secondary | ICD-10-CM | POA: Diagnosis not present

## 2023-11-01 DIAGNOSIS — E7800 Pure hypercholesterolemia, unspecified: Secondary | ICD-10-CM | POA: Diagnosis not present

## 2023-11-03 ENCOUNTER — Ambulatory Visit: Payer: 59 | Admitting: Family Medicine

## 2023-11-05 DIAGNOSIS — E039 Hypothyroidism, unspecified: Secondary | ICD-10-CM | POA: Diagnosis not present

## 2023-11-05 DIAGNOSIS — M5412 Radiculopathy, cervical region: Secondary | ICD-10-CM | POA: Diagnosis not present

## 2023-11-05 DIAGNOSIS — Z79899 Other long term (current) drug therapy: Secondary | ICD-10-CM | POA: Diagnosis not present

## 2023-11-05 DIAGNOSIS — E876 Hypokalemia: Secondary | ICD-10-CM | POA: Diagnosis not present

## 2023-11-05 DIAGNOSIS — M545 Low back pain, unspecified: Secondary | ICD-10-CM | POA: Diagnosis not present

## 2023-11-07 DIAGNOSIS — Z79899 Other long term (current) drug therapy: Secondary | ICD-10-CM | POA: Diagnosis not present

## 2023-11-15 ENCOUNTER — Ambulatory Visit: Payer: 59 | Admitting: Family Medicine

## 2023-12-15 ENCOUNTER — Other Ambulatory Visit: Payer: Self-pay | Admitting: Family Medicine

## 2023-12-19 ENCOUNTER — Encounter: Payer: Self-pay | Admitting: Family Medicine

## 2023-12-19 ENCOUNTER — Ambulatory Visit: Payer: 59 | Admitting: Family Medicine

## 2023-12-19 VITALS — BP 156/102 | HR 77 | Ht <= 58 in | Wt 215.2 lb

## 2023-12-19 DIAGNOSIS — I10 Essential (primary) hypertension: Secondary | ICD-10-CM

## 2023-12-19 DIAGNOSIS — E039 Hypothyroidism, unspecified: Secondary | ICD-10-CM

## 2023-12-19 DIAGNOSIS — R7303 Prediabetes: Secondary | ICD-10-CM | POA: Diagnosis not present

## 2023-12-19 DIAGNOSIS — E038 Other specified hypothyroidism: Secondary | ICD-10-CM

## 2023-12-19 DIAGNOSIS — F331 Major depressive disorder, recurrent, moderate: Secondary | ICD-10-CM

## 2023-12-19 DIAGNOSIS — E785 Hyperlipidemia, unspecified: Secondary | ICD-10-CM

## 2023-12-19 DIAGNOSIS — N62 Hypertrophy of breast: Secondary | ICD-10-CM | POA: Diagnosis not present

## 2023-12-19 LAB — POCT GLYCOSYLATED HEMOGLOBIN (HGB A1C): Hemoglobin A1C: 5.6 % (ref 4.0–5.6)

## 2023-12-19 MED ORDER — ATORVASTATIN CALCIUM 40 MG PO TABS: 40.0000 mg | ORAL_TABLET | Freq: Every day | ORAL | 1 refills | Status: AC

## 2023-12-19 MED ORDER — HYDROCHLOROTHIAZIDE 12.5 MG PO TABS: 12.5000 mg | ORAL_TABLET | Freq: Every day | ORAL | 1 refills | Status: AC

## 2023-12-19 MED ORDER — LOSARTAN POTASSIUM 100 MG PO TABS: 100.0000 mg | ORAL_TABLET | Freq: Every day | ORAL | 1 refills | Status: DC

## 2023-12-19 NOTE — Assessment & Plan Note (Signed)
 Breast exam symmetrical with no acute findings I reviewed her most recent mammogram result which is benign and recommended repeat mammogram in 1 yr Patient reassured, normal breast exam Monitor for now and f/u as needed She agreed with the plan

## 2023-12-19 NOTE — Patient Instructions (Signed)
 Peripheral Neuropathy Peripheral neuropathy is a type of nerve damage. It affects nerves that carry signals between the spinal cord and the arms, legs, and the rest of the body (peripheral nerves). It does not affect nerves in the spinal cord or brain. In peripheral neuropathy, one nerve or a group of nerves may be damaged. Peripheral neuropathy is a broad category that includes many specific nerve disorders, like diabetic neuropathy, hereditary neuropathy, and carpal tunnel syndrome. What are the causes? This condition may be caused by: Certain diseases, such as: Diabetes. This is the most common cause of peripheral neuropathy. Autoimmune diseases, such as rheumatoid arthritis and systemic lupus erythematosus. Nerve diseases that are passed from parent to child (inherited). Kidney disease. Thyroid disease. Other causes may include: Nerve injury. Pressure or stress on a nerve that lasts a long time. Lack (deficiency) of B vitamins. This can result from alcoholism, poor diet, or a restricted diet. Infections. Some medicines, such as cancer medicines (chemotherapy). Poisonous (toxic) substances, such as lead and mercury. Too little blood flowing to the legs. In some cases, the cause of this condition is not known. What are the signs or symptoms? Symptoms of this condition depend on which of your nerves is damaged. Symptoms in the legs, hands, and arms can include: Loss of feeling (numbness) in the feet, hands, or both. Tingling in the feet, hands, or both. Burning pain. Very sensitive skin. Weakness. Not being able to move a part of the body (paralysis). Clumsiness or poor coordination. Muscle twitching. Loss of balance. Symptoms in other parts of the body can include: Not being able to control your bladder. Feeling dizzy. Sexual problems. How is this diagnosed? Diagnosing and finding the cause of peripheral neuropathy can be difficult. Your health care provider will take your  medical history and do a physical exam. A neurological exam will also be done. This involves checking things that are affected by your brain, spinal cord, and nerves (nervous system). For example, your health care provider will check your reflexes, how you move, and what you can feel. You may have other tests, such as: Blood tests. Electromyogram (EMG) and nerve conduction tests. These tests check nerve function and how well the nerves are controlling the muscles. Imaging tests, such as a CT scan or MRI, to rule out other causes of your symptoms. Removing a small piece of nerve to be examined in a lab (nerve biopsy). Removing and examining a small amount of the fluid that surrounds the brain and spinal cord (lumbar puncture). How is this treated? Treatment for this condition may involve: Treating the underlying cause of the neuropathy, such as diabetes, kidney disease, or vitamin deficiencies. Stopping medicines that can cause neuropathy, such as chemotherapy. Medicine to help relieve pain. Medicines may include: Prescription or over-the-counter pain medicine. Anti-seizure medicine. Antidepressants. Pain-relieving patches that are applied to painful areas of skin. Surgery to relieve pressure on a nerve or to destroy a nerve that is causing pain. Physical therapy to help improve movement and balance. Devices to help you move around (assistive devices). Follow these instructions at home: Medicines Take over-the-counter and prescription medicines only as told by your health care provider. Do not take any other medicines without first asking your health care provider. Ask your health care provider if the medicine prescribed to you requires you to avoid driving or using machinery. Lifestyle  Do not use any products that contain nicotine or tobacco. These products include cigarettes, chewing tobacco, and vaping devices, such as e-cigarettes. Smoking keeps  blood from reaching damaged nerves. If you  need help quitting, ask your health care provider. Avoid or limit alcohol. Too much alcohol can cause a vitamin B deficiency, and vitamin B is needed for healthy nerves. Eat a healthy diet. This includes: Eating foods that are high in fiber, such as beans, whole grains, and fresh fruits and vegetables. Limiting foods that are high in fat and processed sugars, such as fried or sweet foods. General instructions  If you have diabetes, work closely with your health care provider to keep your blood sugar under control. If you have numbness in your feet: Check every day for signs of injury or infection. Watch for redness, warmth, and swelling. Wear padded socks and comfortable shoes. These help protect your feet. Develop a good support system. Living with peripheral neuropathy can be stressful. Consider talking with a mental health specialist or joining a support group. Use assistive devices and attend physical therapy as told by your health care provider. This may include using a walker or a cane. Keep all follow-up visits. This is important. Where to find more information [employer] of Neurological Disorders: [employer] Contact a health care provider if: You have new signs or symptoms of peripheral neuropathy. You are struggling emotionally from dealing with peripheral neuropathy. Your pain is not well controlled. Get help right away if: You have an injury or infection that is not healing normally. You develop new weakness in an arm or leg. You have fallen or do so frequently. Summary Peripheral neuropathy is when the nerves in the arms or legs are damaged, resulting in numbness, weakness, or pain. There are many causes of peripheral neuropathy, including diabetes, pinched nerves, vitamin deficiencies, autoimmune disease, and hereditary conditions. Diagnosing and finding the cause of peripheral neuropathy can be difficult. Your health care provider will take your medical  history, do a physical exam, and do tests, including blood tests and nerve function tests. Treatment involves treating the underlying cause of the neuropathy and taking medicines to help control pain. Physical therapy and assistive devices may also help. This information is not intended to replace advice given to you by your health care provider. Make sure you discuss any questions you have with your health care provider. Document Revised: 08/03/2021 Document Reviewed: 08/03/2021 Elsevier Patient Education  2024 [employer].

## 2023-12-19 NOTE — Assessment & Plan Note (Signed)
 Off Synthroid for months TSH checked - normal, we will get her off Synthroid altogether if TSH is normal She agreed with the plan

## 2023-12-19 NOTE — Assessment & Plan Note (Signed)
 Off Losartan Refilled Losartan Continue hydrochlorothiazide and current dose of Norvasc F/U in 2-4 weeks for BP reassessment and I advised she comes in with all her med bottles Keep BP at 120/60 - 140/90 Bmet checked today

## 2023-12-19 NOTE — Progress Notes (Signed)
    SUBJECTIVE:   CHIEF COMPLAINT / HPI:   HTN/HLD/Hypothyroidism: She is here for a follow-up. She has been off her Synthroid  for 3 months and requested lab follow-up. She stated that she had a recent normal TSH while off her med with a different provider. She has been off her Lipitor for months. She was started on Amlodipine  by a pain specialist, but she could not tell the dose. She is also compliant with her HCTZ 25 mg QD. She has been off Losartan  100 mg for many months and needs a refill.   Large breast:  She feels her right breast is larger than the left. She denies breast pain and no lump. Feels well otherwise.   MDD: She is off her meds. 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 She wants help to improve her mood.   OBJECTIVE:   BP (!) 156/102   Pulse 77   Ht 1' (0.305 m)   Wt 215 lb 4 oz (97.6 kg)   LMP 11/27/2023   SpO2 100%   BMI 1050.95 kg/m   Physical Exam Vitals and nursing note reviewed. Exam conducted with a chaperone present Ceasar Leggette).  Cardiovascular:     Rate and Rhythm: Normal rate and regular rhythm.     Heart sounds: Normal heart sounds. No murmur heard. Pulmonary:     Effort: Pulmonary effort is normal. No respiratory distress.     Breath sounds: Normal breath sounds. No wheezing.  Chest:  Breasts:    Right: Normal. No swelling, bleeding, mass, nipple discharge, skin change or tenderness.     Left: Normal. No swelling, bleeding, mass, nipple discharge, skin change or tenderness.  Lymphadenopathy:     Upper Body:     Right upper body: No axillary adenopathy.     Left upper body: No axillary adenopathy.  Neurological:     General: No focal deficit present.     Mental Status: She is oriented to person, place, and time.  Psychiatric:        Mood and Affect: Mood normal.        Behavior: Behavior normal.        Thought Content: Thought content normal.        Judgment: Judgment normal.     Comments:  Flowsheet Row Office Visit from 12/19/2023 in [facility]  PHQ-9 Total Score 10          ASSESSMENT/PLAN:   Essential hypertension Off Losartan  Refilled Losartan  Continue hydrochlorothiazide  and current dose of Norvasc  F/U in 2-4 weeks for BP reassessment and I advised she comes in with all her med bottles Keep BP at 120/60 - 140/90 Bmet checked today  Hyperlipidemia FLP checked Lipitor refilled  Subclinical hypothyroidism Off Synthroid  for months TSH checked - normal, we will get her off Synthroid  altogether if TSH is normal She agreed with the plan  Large breasts Breast exam symmetrical with no acute findings I reviewed her most recent mammogram result which is benign and recommended repeat mammogram in 1 yr Patient reassured, normal breast exam Monitor for now and f/u as needed She agreed with the plan  MDD (major depressive disorder) Cymbalta  refilled already I referred her to [facility] to connect with Psychology resources She agreed with the plan     Otto Fairly, MD [facility]

## 2023-12-19 NOTE — Assessment & Plan Note (Signed)
 Cymbalta refilled already I referred her to [facility] to connect with Psychology resources She agreed with the plan

## 2023-12-19 NOTE — Assessment & Plan Note (Signed)
 FLP checked Lipitor refilled

## 2023-12-20 ENCOUNTER — Telehealth: Payer: Self-pay | Admitting: *Deleted

## 2023-12-20 ENCOUNTER — Other Ambulatory Visit: Payer: Self-pay | Admitting: Student

## 2023-12-20 ENCOUNTER — Telehealth: Payer: Self-pay | Admitting: Family Medicine

## 2023-12-20 LAB — LIPID PANEL
Chol/HDL Ratio: 5.7 {ratio} — ABNORMAL HIGH (ref 0.0–4.4)
Cholesterol, Total: 298 mg/dL — ABNORMAL HIGH (ref 100–199)
HDL: 52 mg/dL (ref >39)
LDL Chol Calc (NIH): 228 mg/dL — ABNORMAL HIGH (ref 0–99)
Triglycerides: 103 mg/dL (ref 0–149)
VLDL Cholesterol Cal: 18 mg/dL (ref 5–40)

## 2023-12-20 LAB — TSH: TSH: 3.59 u[IU]/mL (ref 0.450–4.500)

## 2023-12-20 LAB — BASIC METABOLIC PANEL
BUN/Creatinine Ratio: 10 (ref 9–23)
BUN: 8 mg/dL (ref 6–24)
CO2: 23 mmol/L (ref 20–29)
Calcium: 10.7 mg/dL — ABNORMAL HIGH (ref 8.7–10.2)
Chloride: 100 mmol/L (ref 96–106)
Creatinine, Ser: 0.82 mg/dL (ref 0.57–1.00)
Glucose: 99 mg/dL (ref 70–99)
Potassium: 3.9 mmol/L (ref 3.5–5.2)
Sodium: 139 mmol/L (ref 134–144)
eGFR: 87 mL/min/{1.73_m2} (ref >59)

## 2023-12-20 NOTE — Telephone Encounter (Signed)
 HIPAA compliant callback message left. Please advise when she calls:  TEST RESULTS TSH is normal. We can hold off on taking Syndroid for now. Your electrolytes, including potassium, are normal. Please don't take potassium supplements. Your calcium  level is elevated. Please keep well hydrated and return to me in 2 weeks for retesting. We can do that at your next scheduled appointment.

## 2023-12-20 NOTE — Progress Notes (Signed)
 Complex Care Management Note  Care Guide Note 12/20/2023 Name: Lisa Newton MRN: [id number] DOB: [birth year]  Lisa Newton is a 53 y.o. year old female who sees Anders Otto DASEN, MD for primary care. I reached out to Lisa Newton by phone today to offer complex care management services.  Lisa Newton was given information about Complex Care Management services today including:   The Complex Care Management services include support from the care team which includes your Nurse Coordinator, Clinical Social Worker, or Pharmacist.  The Complex Care Management team is here to help remove barriers to the health concerns and goals most important to you. Complex Care Management services are voluntary, and the patient may decline or stop services at any time by request to their care team member.   Complex Care Management Consent Status: Patient agreed to services and verbal consent obtained.   Follow up plan:  Telephone appointment with complex care management team member scheduled for:  12/27/2023  Encounter Outcome:  Patient Scheduled  Thedford Franks, [facility] Care Coordination Care Guide Direct Dial: [phone]

## 2023-12-27 ENCOUNTER — Ambulatory Visit: Payer: Self-pay | Admitting: Licensed Clinical Social Worker

## 2023-12-27 NOTE — Patient Outreach (Signed)
  Care Coordination   12/27/2023 Name: Lisa Newton MRN: [id number] DOB: 04-29-1971   Care Coordination Outreach Attempts:  An unsuccessful telephone outreach was attempted today to offer the patient information about available complex care management services.  Follow Up Plan:  Additional outreach attempts will be made to offer the patient complex care management information and services.   Encounter Outcome:  No Answer   Care Coordination Interventions:  No, not indicated    Fletcher Humble MSW, LCSW Licensed Clinical Social Worker  [facility], Population Health Direct Dial: [phone]  Fax: [phone]

## 2023-12-28 ENCOUNTER — Ambulatory Visit: Payer: 59 | Admitting: Family Medicine

## 2023-12-28 VITALS — BP 177/98 | Temp 101.5°F | Ht 66.0 in | Wt <= 1120 oz

## 2023-12-28 DIAGNOSIS — R051 Acute cough: Secondary | ICD-10-CM | POA: Diagnosis not present

## 2023-12-28 DIAGNOSIS — H66002 Acute suppurative otitis media without spontaneous rupture of ear drum, left ear: Secondary | ICD-10-CM

## 2023-12-28 DIAGNOSIS — I10 Essential (primary) hypertension: Secondary | ICD-10-CM

## 2023-12-28 LAB — POC SOFIA 2 FLU + SARS ANTIGEN FIA
Influenza A, POC: NEGATIVE
Influenza B, POC: NEGATIVE
SARS Coronavirus 2 Ag: NEGATIVE

## 2023-12-28 MED ORDER — PROMETHAZINE-DM 6.25-15 MG/5ML PO SYRP: 5.0000 mL | ORAL_SOLUTION | Freq: Four times a day (QID) | ORAL | 0 refills | Status: DC | PRN

## 2023-12-28 MED ORDER — IBUPROFEN 200 MG PO TABS
600.0000 mg | ORAL_TABLET | Freq: Once | ORAL | Status: AC
  Administered 2023-12-28: 600 mg via ORAL

## 2023-12-28 MED ORDER — ACETAMINOPHEN 500 MG PO TABS
1000.0000 mg | ORAL_TABLET | Freq: Once | ORAL | Status: AC
  Administered 2023-12-28: 1000 mg via ORAL

## 2023-12-28 MED ORDER — CEFDINIR 300 MG PO CAPS: 300.0000 mg | ORAL_CAPSULE | Freq: Two times a day (BID) | ORAL | 0 refills | Status: AC

## 2023-12-28 NOTE — Progress Notes (Addendum)
    SUBJECTIVE:   CHIEF COMPLAINT / HPI:   Cough, headache 4 days ago, patient states she felt very hot all over.  She was unable to sleep well.  About 2 days ago, she woke up with severe left ear pain, cough, sore throat, and chest pain/shortness of breath (with coughing).  She is also been running a fever, though she does not know how high it has been.  She has been trying to use lidocaine gel on her body to help with back pain which is chronic though worse since that she has been sick.  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  No nausea, vomiting, bowel changes; however, she does feel like she has to vomit after coughing a lot. Not up-to-date on flu or COVID vaccines. 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  PERTINENT  PMH / PSH: Migraines, hypertension, subclinical hypothyroidism, epidural fibrosis, spondylosis without myelopathy, appendicitis, cervical herniated nucleus pulposus, allergies, MDD, long-term use of opiates, HLD, history of DVT, chronic shoulder pain and sacroiliac/knee joint pain  OBJECTIVE:   BP (!) 177/98   Temp (!) 101.5 F (38.6 C)   Ht 5\' 6"  (1.676 m)   Wt 21 lb 9.6 oz (9.798 kg)   LMP 11/27/2023   SpO2 100%   BMI 3.49 kg/m   General: Alert and oriented, uncomfortable appearing Skin: Warm, dry, and intact without appreciable lesions HEENT: NCAT, EOM grossly normal, midline nasal septum, left TM with purulent fluid and air-fluid level seen, right TM normal, posterior oropharynx erythematous without tonsillar exudates, nasal congestion Cardiac: RRR, no m/r/g appreciated Respiratory: CTAB, breathing and speaking comfortably on RA, intermittently coughing Abdominal: Soft, nontender, nondistended, normoactive bowel sounds Extremities: Moves all extremities grossly equally Neurological: No gross focal deficit  ASSESSMENT/PLAN:   Acute otitis media  with likely superimposed viral process As seen on history and exam.  Reassuringly without focal findings on respiratory exam.  COVID/flu negative.  Given 1000 mg of Tylenol in the office without difficulty.  Stated she would take 600 mg of ibuprofen when she got home.  Will treat AOM with cefdinir 300 mg twice daily for 7 days given rash/hives with amoxicillin and history of structural left inner ear disease.  Appears she has a referral to follow-up with [facility] for this. Will also send in promethazine-dextromethorphan for symptomatic relief.  Also recommended honey and warm tea, being around humidifier, using nasal saline/Nettie pot, and Tylenol/ibuprofen for fever and discomfort.  Discussed strict return precautions of worsening status, increasing shortness of breath, and chest pain at rest.  Patient understanding and appreciative.  Essential hypertension Elevated today likely in the setting of acute illness.  Recheck at subsequent office visits.  Janeal Holmes, MD [facility] Health [facility]

## 2023-12-28 NOTE — Assessment & Plan Note (Signed)
Elevated today likely in the setting of acute illness.  Recheck at subsequent office visits.

## 2023-12-28 NOTE — Patient Instructions (Addendum)
We tested you for COVID and flu today that was negative. You have a left ear infection.  I have sent in cefdinir to be taken twice daily since you have had hives with amoxicillin in the past.  Please let me know if you have any adverse reactions to this and stop taking the medication. I have also sent in cough syrup.  I also recommend honey and warm tea, being around humidifiers, using nasal saline/Nettie pot, and Tylenol/ibuprofen for fever and discomfort. If you are not improving in the next couple of days, let me know.

## 2023-12-29 ENCOUNTER — Telehealth: Payer: Self-pay

## 2023-12-29 NOTE — Telephone Encounter (Signed)
Patient LVM on  nurse line regarding continued sick symptoms. Patient is tearful on voicemail.   Returned call to patient. She reports continued headache. Cough medication not covered by insurance.   She has been taking antibiotics. She has taken 3 doses of antibiotic. Ear pain and swelling has improved.   Sore throat- has not been eating. Reports adequate fluid intake.   Called pharmacy regarding cough medication. They report that insurance does not cover medication, cash price is $19.29. They are also going to run prescription on discount card.   Provided patient with update. Discussed supportive measures and ED precautions.   Veronda Prude, RN

## 2023-12-30 ENCOUNTER — Telehealth: Payer: Self-pay | Admitting: Family Medicine

## 2023-12-30 DIAGNOSIS — Z79899 Other long term (current) drug therapy: Secondary | ICD-10-CM | POA: Diagnosis not present

## 2023-12-30 DIAGNOSIS — M5412 Radiculopathy, cervical region: Secondary | ICD-10-CM | POA: Diagnosis not present

## 2023-12-30 DIAGNOSIS — I10 Essential (primary) hypertension: Secondary | ICD-10-CM | POA: Diagnosis not present

## 2023-12-30 DIAGNOSIS — M545 Low back pain, unspecified: Secondary | ICD-10-CM | POA: Diagnosis not present

## 2023-12-30 NOTE — Telephone Encounter (Signed)
Called patient to check in on sick symptoms. Patient sounds congested, but she is able to hold a conversation with me without dyspnea.  She states she is improving slowly. She has not been able to pick up her promethazine-dextromethorphan yet, but she plans to do so today. She also has an appointment with her pain doctor today for her back pain. States she is able to stay hydrated, but she did vomit overnight, and it's hard to eat due to sore throat. Cough is a little better. She also felt like she had a little fever overnight.  Today will be 48 hours on cefdinir for AOM. Hopeful she will continue to demonstrate improvement. Her structural disease of the L internal auditory canal, chronic petrositis, and hx of mastoidectomy are likely playing a large role in her ear pain and headaches. Advised that promethazine-dextromethorphan will also hopefully help with any nausea she is experiencing.   Discussed strict return precautions again given her higher risk of refractory AOM and expansion of infection given her history as above. Advised to keep me updated about her status and to go to ED or urgent care should she stop improving or begin to worsen over the weekend.

## 2024-01-01 ENCOUNTER — Telehealth: Payer: Self-pay | Admitting: *Deleted

## 2024-01-01 NOTE — Progress Notes (Signed)
Complex Care Management Care Guide Note  01/01/2024 Name: TALISSA LAHTI MRN: [id number] DOB: Apr 13, 1971  Kathryne Eriksson is a 53 y.o. year old female who is a primary care patient of Doreene Eland, MD and is actively engaged with the care management team. I reached out to Kathryne Eriksson by phone today to assist with re-scheduling  with the Licensed Clinical Child psychotherapist.  Follow up plan: Unsuccessful telephone outreach attempt made.   Gwenevere Ghazi  [facility] Health  Value-Based Care Institute, [facility] Guide  Direct Dial: [phone]  Fax [phone]

## 2024-01-03 DIAGNOSIS — Z79899 Other long term (current) drug therapy: Secondary | ICD-10-CM | POA: Diagnosis not present

## 2024-01-08 NOTE — Progress Notes (Signed)
Complex Care Management Care Guide Note  01/08/2024 Name: [name] MRN: [id number] DOB: 20-Oct-1971  Lisa Newton is a 53 y.o. year old female who is a primary care patient of Doreene Eland, MD and is actively engaged with the care management team. I reached out to Lisa Newton by phone today to assist with re-scheduling  with the Licensed Clinical Child psychotherapist.  Follow up plan: Telephone appointment with complex care management team member scheduled for:  01/15/24  Gwenevere Ghazi  [facility] Health  [facility], [facility] Guide  Direct Dial: [phone]  Fax [phone]

## 2024-01-12 ENCOUNTER — Encounter: Payer: Self-pay | Admitting: Family Medicine

## 2024-01-12 DIAGNOSIS — M5412 Radiculopathy, cervical region: Secondary | ICD-10-CM | POA: Diagnosis not present

## 2024-01-12 DIAGNOSIS — I10 Essential (primary) hypertension: Secondary | ICD-10-CM | POA: Diagnosis not present

## 2024-01-12 DIAGNOSIS — Z79899 Other long term (current) drug therapy: Secondary | ICD-10-CM | POA: Diagnosis not present

## 2024-01-12 DIAGNOSIS — M545 Low back pain, unspecified: Secondary | ICD-10-CM | POA: Diagnosis not present

## 2024-01-13 ENCOUNTER — Other Ambulatory Visit: Payer: Self-pay | Admitting: Family Medicine

## 2024-01-15 ENCOUNTER — Ambulatory Visit: Payer: Self-pay | Admitting: Licensed Clinical Social Worker

## 2024-01-15 MED ORDER — PROMETHAZINE-DM 6.25-15 MG/5ML PO SYRP: 5.0000 mL | ORAL_SOLUTION | Freq: Two times a day (BID) | ORAL | 0 refills | Status: AC | PRN

## 2024-01-15 NOTE — Patient Outreach (Signed)
  Care Coordination   01/15/2024 Name: Lisa Newton MRN: [id number] DOB: 11/17/1971   Care Coordination Outreach Attempts:  An unsuccessful telephone outreach was attempted today to offer the patient information about available complex care management services.  Follow Up Plan:  Additional outreach attempts will be made to offer the patient complex care management information and services.   LCSW A. Felton Clinton called pt twice at 3:20pm and 4:00pm per appointment desk note. LCSW A. Felton Clinton left detail voicemail requesting callback at [phone].    Encounter Outcome:  No Answer   Care Coordination Interventions:  No, not indicated    Gwyndolyn Saxon MSW, LCSW Licensed Clinical Social Worker  [facility], Population Health Direct Dial: [phone]  Fax: [phone]

## 2024-01-16 ENCOUNTER — Encounter: Payer: Self-pay | Admitting: Family Medicine

## 2024-01-16 ENCOUNTER — Ambulatory Visit (INDEPENDENT_AMBULATORY_CARE_PROVIDER_SITE_OTHER): Payer: 59 | Admitting: Family Medicine

## 2024-01-16 VITALS — BP 142/94 | HR 88 | Temp 98.1°F | Ht 66.0 in | Wt 211.5 lb

## 2024-01-16 DIAGNOSIS — E038 Other specified hypothyroidism: Secondary | ICD-10-CM | POA: Diagnosis not present

## 2024-01-16 DIAGNOSIS — N912 Amenorrhea, unspecified: Secondary | ICD-10-CM

## 2024-01-16 DIAGNOSIS — G8929 Other chronic pain: Secondary | ICD-10-CM | POA: Diagnosis not present

## 2024-01-16 DIAGNOSIS — N62 Hypertrophy of breast: Secondary | ICD-10-CM

## 2024-01-16 DIAGNOSIS — I10 Essential (primary) hypertension: Secondary | ICD-10-CM

## 2024-01-16 DIAGNOSIS — Z79891 Long term (current) use of opiate analgesic: Secondary | ICD-10-CM | POA: Diagnosis not present

## 2024-01-16 DIAGNOSIS — H9072 Mixed conductive and sensorineural hearing loss, unilateral, left ear, with unrestricted hearing on the contralateral side: Secondary | ICD-10-CM | POA: Diagnosis not present

## 2024-01-16 DIAGNOSIS — H9202 Otalgia, left ear: Secondary | ICD-10-CM

## 2024-01-16 LAB — POCT URINE PREGNANCY: Preg Test, Ur: NEGATIVE

## 2024-01-16 NOTE — Assessment & Plan Note (Signed)
Utox ordered F/U with pain clinic as planned.

## 2024-01-16 NOTE — Patient Instructions (Signed)
Earache, Adult An earache, or ear pain, can be caused by many things, including: An infection. Ear wax buildup. Ear pressure. Something in the ear that should not be there (foreign body). A sore throat. Tooth problems. Jaw problems. Treatment of the earache will depend on the cause. If the cause is not clear or cannot be known, you may need to watch your symptoms until your earache goes away or until a cause is found. Follow these instructions at home: Medicines Take or apply over-the-counter and prescription medicines only as told by your health care provider. If you were prescribed antibiotics, use them as told by your health care provider. Do not stop using the antibiotic even if you start to feel better. Do not put anything in your ear other than medicine that is prescribed by your health care provider. Managing pain     If directed, apply heat to the affected area as often as told by your health care provider. Use the heat source that your health care provider recommends, such as a moist heat pack or a heating pad. Place a towel between your skin and the heat source. Leave the heat on for 20-30 minutes. If your skin turns bright red, remove the heat right away to prevent burns. The risk of burns is higher if you cannot feel pain, heat, or cold. If directed, put ice on the affected area. To do this: Put ice in a plastic bag. Place a towel between your skin and the bag. Leave the ice on for 20 minutes, 2-3 times a day. If your skin turns bright red, remove the ice right away to prevent skin damage. The risk of skin damage is higher if you cannot feel pain, heat, or cold.  General instructions Pay attention to any changes in your symptoms. Try resting in an upright position instead of lying down. This may help to reduce pressure in your ear and relieve pain. Chew gum if it helps to relieve your ear pain. Treat any allergies as told by your health care provider. Drink enough fluid  to keep your urine pale yellow. It is up to you to get the results of any tests that were done. Ask your health care provider, or the department that is doing the tests, when your results will be ready. Contact a health care provider if: Your pain does not improve within 2 days. Your earache gets worse. You have new symptoms. You have a fever. Get help right away if: You have a severe headache. You have a stiff neck. You have trouble swallowing. You have redness or swelling behind your ear. You have fluid or blood coming from your ear. You have hearing loss. You feel dizzy. This information is not intended to replace advice given to you by your health care provider. Make sure you discuss any questions you have with your health care provider. Document Revised: 04/11/2022 Document Reviewed: 04/11/2022 Elsevier Patient Education  2024 Elsevier Inc.  

## 2024-01-16 NOTE — Assessment & Plan Note (Signed)
BP above goal Monitor for now Consider med adjustment at her next appointment She agreed with the plan

## 2024-01-16 NOTE — Progress Notes (Signed)
 SUBJECTIVE:   CHIEF COMPLAINT / HPI:   Otalgia  There is pain in the left ear. This is a recurrent problem. The current episode started 1 to 4 weeks ago (Started 4 weeks ago). The problem has been gradually improving. There has been no fever (Feels hot so much, but did not measure her temp). The pain is at a severity of 7/10. The pain is moderate. Associated symptoms include ear discharge and hearing loss. Pertinent negatives include no coughing, headaches, neck pain, sore throat or vomiting. Associated symptoms comments: Some dark bloody ear discharge. She uses q-tip to pick her left ear and she sees blood on the q-tip. Reduced hearing in her left ear. She has tried acetaminophen  (Percocet. She also completed A/B - Cefdinir  x 7 days from 12/18/23) for the symptoms. The treatment provided moderate relief.    HTN/Hypothyroid: Here for follow-up. She is compliant with HCTZ 12.5 mg QD and Losartan  100 mg QD. She stopped taking her Synthroid , given normal recent TSH.   Amenorrhea/+THC: She stated that she missed her period x 2 months. Periods have been irregular. She wants to check for pregnancy.  Her recent Utox with pain management was positive with THC. She stated that she has not been expose to this drug recently and is surprised by the result. She wants me to recheck this for her today.  At the end of the visit, she asked about her right breast, which felt larger than the other. No pain or lump.    PERTINENT  PMH / PSH: PMHx reviewed  OBJECTIVE:   BP (!) 142/94   Pulse 88   Temp 98.1 F (36.7 C) (Oral)   Ht 5' 6 (1.676 m)   Wt 211 lb 8 oz (95.9 kg)   LMP 11/27/2023   SpO2 100%   BMI 34.14 kg/m   Physical Exam Vitals and nursing note reviewed.  HENT:     Right Ear: Tympanic membrane and ear canal normal. There is no impacted cerumen.     Left Ear: Tympanic membrane and ear canal normal. There is no impacted cerumen.  Cardiovascular:     Rate and Rhythm: Normal rate and  regular rhythm.     Heart sounds: Normal heart sounds. No murmur heard. Pulmonary:     Effort: Pulmonary effort is normal. No respiratory distress.     Breath sounds: Normal breath sounds. No wheezing.  Neurological:     Mental Status: She is alert and oriented to person, place, and time.      ASSESSMENT/PLAN:   Chronic ear pain, left Associated with hearing loss Hx of bacteria petrositis/osteomyelitis I wonder if this is contributory to her symptoms CT temporal ordered to assess Consider ENT referral - she prefers to wait on this till we have CT report N A/B indicated at this time F/U if symptoms worsen She agreed with the plan   Essential hypertension BP above goal Monitor for now Consider med adjustment at her next appointment She agreed with the plan  Subclinical hypothyroidism Normal TSH Hold Synthroid  for now Recheck lab in about 4-8 weeks  Amenorrhea Likely perimenopausal She is worried about pregnancy Neg urine pregnancy test today Monitor for now  Large breasts Unable to examine breast today Symptoms is unchanged from previous visit She is due for diagnostic mammogram in April I offered diagnostic mammogram now, but she will differ till later  Long term current use of opiate analgesic Utox ordered F/U with pain clinic as planned.  Otto Fairly, MD [facility] Health [facility]

## 2024-01-16 NOTE — Assessment & Plan Note (Addendum)
Unable to examine breast today Symptoms is unchanged from previous visit She is due for diagnostic mammogram in April I offered diagnostic mammogram now, but she will differ till later

## 2024-01-16 NOTE — Assessment & Plan Note (Signed)
 Associated with hearing loss Hx of bacteria petrositis/osteomyelitis I wonder if this is contributory to her symptoms CT temporal ordered to assess Consider ENT referral - she prefers to wait on this till we have CT report N A/B indicated at this time F/U if symptoms worsen She agreed with the plan

## 2024-01-16 NOTE — Assessment & Plan Note (Signed)
Normal TSH Hold Synthroid for now Recheck lab in about 4-8 weeks

## 2024-01-16 NOTE — Assessment & Plan Note (Signed)
Likely perimenopausal She is worried about pregnancy Neg urine pregnancy test today Monitor for now

## 2024-01-17 DIAGNOSIS — Z79899 Other long term (current) drug therapy: Secondary | ICD-10-CM | POA: Diagnosis not present

## 2024-01-19 ENCOUNTER — Telehealth: Payer: Self-pay | Admitting: *Deleted

## 2024-01-19 ENCOUNTER — Telehealth: Payer: Self-pay

## 2024-01-19 NOTE — Telephone Encounter (Signed)
 Spoke with patient. Informed her of CT scan at [facility]. Feb 13th at 4:15 enter in a entrance A. Patient took down info. Linnie Riches, CMA

## 2024-01-19 NOTE — Telephone Encounter (Signed)
-----   Message from [facility] Genevia Kern S sent at 01/18/2024  6:25 PM EST ----- Ok to schedule CT at [facility].  Clovis Dar

## 2024-01-19 NOTE — Progress Notes (Signed)
 Complex Care Management Care Guide Note  01/19/2024 Name: [name] MRN: [id number] DOB: 1971-02-22  Lisa Newton is a 53 y.o. year old female who is a primary care patient of Anders Otto DASEN, MD and is actively engaged with the care management team. I reached out to Lisa Newton by phone today to assist with re-scheduling  with the Licensed Clinical Child Psychotherapist.  Follow up plan: Patient refused to reschedule with Licensed Clinical Social Worker. No further outreach attempts will be made at this time.   Harlene Satterfield  [facility] Health  Value-Based Care Institute, [facility] Guide  Direct Dial: [phone]  Fax [phone]

## 2024-01-19 NOTE — Progress Notes (Signed)
 Patient is scheduled for CT 02/13 @415 

## 2024-01-24 ENCOUNTER — Encounter: Payer: Self-pay | Admitting: Family Medicine

## 2024-01-24 ENCOUNTER — Encounter: Payer: 59 | Admitting: Family Medicine

## 2024-01-24 ENCOUNTER — Telehealth: Payer: Self-pay | Admitting: Family Medicine

## 2024-01-24 NOTE — Telephone Encounter (Signed)
HIPAA compliant callback message left.  If she calls, advise her that unfortunately, after checking with the lab, it seems her urine toxicology was not done. Please help her schedule a lab appointment for this. Thanks.  Lisa Newton

## 2024-01-24 NOTE — Telephone Encounter (Signed)
I called her back and advised her that unfortunately, her urine toxicology order was not picked up. I offered a lab appointment. However, she stated that she meet with her pain specialist next Monday and wait till then. I apologized for the confusion and she was appreciative of the call.

## 2024-01-25 ENCOUNTER — Ambulatory Visit (HOSPITAL_COMMUNITY)
Admission: RE | Admit: 2024-01-25 | Discharge: 2024-01-25 | Disposition: A | Discharge status: Home / Self Care | Payer: 59 | Source: Ambulatory Visit | Attending: Family Medicine | Admitting: Family Medicine

## 2024-01-25 DIAGNOSIS — H9072 Mixed conductive and sensorineural hearing loss, unilateral, left ear, with unrestricted hearing on the contralateral side: Secondary | ICD-10-CM | POA: Diagnosis not present

## 2024-01-25 DIAGNOSIS — H9202 Otalgia, left ear: Secondary | ICD-10-CM | POA: Diagnosis not present

## 2024-01-25 DIAGNOSIS — H9192 Unspecified hearing loss, left ear: Secondary | ICD-10-CM | POA: Diagnosis not present

## 2024-01-25 DIAGNOSIS — G8929 Other chronic pain: Secondary | ICD-10-CM | POA: Diagnosis not present

## 2024-01-28 DIAGNOSIS — M5412 Radiculopathy, cervical region: Secondary | ICD-10-CM | POA: Diagnosis not present

## 2024-01-28 DIAGNOSIS — Z79899 Other long term (current) drug therapy: Secondary | ICD-10-CM | POA: Diagnosis not present

## 2024-01-28 DIAGNOSIS — I10 Essential (primary) hypertension: Secondary | ICD-10-CM | POA: Diagnosis not present

## 2024-01-28 DIAGNOSIS — M545 Low back pain, unspecified: Secondary | ICD-10-CM | POA: Diagnosis not present

## 2024-01-29 NOTE — Progress Notes (Unsigned)
  SUBJECTIVE:   CHIEF COMPLAINT / HPI:   Drug Testing  Back Pain Has chronic back pain, has had 3 back surgeries. Is still hurting after using her percocet. Got her back shots 2 days ago, w/o relief, and also got toradol on Sunday, still no relief. Had steroid/toradol shot 2 weeks ago. Has pain an aching in neck. Has taken all her percocet, w/ plan for refill Friday. Was using THC gummies/smoking THC and thought they helped, but has given it up so she can receive her pain meds. Last time she used [facility] was Jan 5th. No concerns for SI. Pain has been flared since January when she stopped THC. Pain is achey in nature.    PERTINENT  PMH / PSH:    OBJECTIVE:  BP (!) 168/95   Pulse 75   Ht 5\' 6"  (1.676 m)   Wt 215 lb 12.8 oz (97.9 kg)   SpO2 100%   BMI 34.83 kg/m  Physical Exam Musculoskeletal:     Cervical back: Tenderness and bony tenderness present. No spasms. Decreased range of motion.     Thoracic back: Tenderness and bony tenderness present. Decreased range of motion.      ASSESSMENT/PLAN:   Assessment & Plan Other chronic pain Patient comes in for chronic pain.  Patient reports that her pain has been flared since she stopped using THC about a month ago.  Patient appreciates that her back injections did not help, she received a shot of Toradol a week ago that did not help, and that her pain management place gave her issue because of the [facility] that was in her system.  Discussed with patient that she will need to follow-up with pain management, she has an appointment later today.  Will offer Toradol injection for some support.  Patient tearful upon exam, but denies any plans or suicidal ideation. - Toradol 15 mg IM - Follow-up with pain management - Lidocaine patches given No follow-ups on file. Bess Kinds, MD 01/30/2024, 9:19 AM PGY-3, [facility]

## 2024-01-29 NOTE — Patient Instructions (Signed)
It was great to see you! Thank you for allowing me to participate in your care!  I recommend that you always bring your medications to each appointment as this makes it easy to ensure we are on the correct medications and helps [place] not miss when refills are needed.  Our plans for today:  - Testing - Back Pain  We are checking some labs today, I will call you if they are abnormal will send you a MyChart message or a letter if they are normal.  If you do not hear about your labs in the next 2 weeks please let us know.***  Take care and seek immediate care sooner if you develop any concerns.   Dr. Bess Kinds, MD [facility] Medicine

## 2024-01-30 ENCOUNTER — Ambulatory Visit (INDEPENDENT_AMBULATORY_CARE_PROVIDER_SITE_OTHER): Payer: 59

## 2024-01-30 VITALS — BP 172/99 | HR 75 | Ht 66.0 in | Wt 215.8 lb

## 2024-01-30 DIAGNOSIS — G8929 Other chronic pain: Secondary | ICD-10-CM | POA: Diagnosis not present

## 2024-01-30 DIAGNOSIS — Z79899 Other long term (current) drug therapy: Secondary | ICD-10-CM | POA: Diagnosis not present

## 2024-01-30 DIAGNOSIS — M545 Low back pain, unspecified: Secondary | ICD-10-CM | POA: Diagnosis not present

## 2024-01-30 DIAGNOSIS — I10 Essential (primary) hypertension: Secondary | ICD-10-CM | POA: Diagnosis not present

## 2024-01-30 DIAGNOSIS — M5412 Radiculopathy, cervical region: Secondary | ICD-10-CM | POA: Diagnosis not present

## 2024-01-30 MED ORDER — KETOROLAC TROMETHAMINE 15 MG/ML IJ SOLN: 15.0000 mg | Freq: Once | INTRAMUSCULAR | Status: AC

## 2024-01-30 MED ORDER — KETOROLAC TROMETHAMINE 30 MG/ML IJ SOLN
30.0000 mg | Freq: Once | INTRAMUSCULAR | Status: AC
  Administered 2024-01-30: 30 mg via INTRAMUSCULAR

## 2024-01-30 MED ORDER — KETOROLAC TROMETHAMINE 15 MG/ML IJ SOLN: 15.0000 mg | Freq: Once | INTRAMUSCULAR | Status: DC

## 2024-01-30 NOTE — Assessment & Plan Note (Addendum)
Patient comes in for chronic pain.  Patient reports that her pain has been flared since she stopped using THC about a month ago.  Patient appreciates that her back injections did not help, she received a shot of Toradol a week ago that did not help, and that her pain management place gave her issue because of the [facility] that was in her system.  Discussed with patient that she will need to follow-up with pain management, she has an appointment later today.  Will offer Toradol injection for some support.  Patient tearful upon exam, but denies any plans or suicidal ideation. - Toradol 15 mg IM - Follow-up with pain management - Lidocaine patches given

## 2024-02-02 DIAGNOSIS — Z79899 Other long term (current) drug therapy: Secondary | ICD-10-CM | POA: Diagnosis not present

## 2024-02-05 ENCOUNTER — Other Ambulatory Visit: Payer: Self-pay | Admitting: Family Medicine

## 2024-02-06 ENCOUNTER — Emergency Department (HOSPITAL_BASED_OUTPATIENT_CLINIC_OR_DEPARTMENT_OTHER)
Admission: EM | Admit: 2024-02-06 | Discharge: 2024-02-06 | Discharge status: Against Medical Advice | Payer: 59 | Attending: Emergency Medicine | Admitting: Emergency Medicine

## 2024-02-06 ENCOUNTER — Other Ambulatory Visit: Payer: Self-pay

## 2024-02-06 ENCOUNTER — Encounter (HOSPITAL_BASED_OUTPATIENT_CLINIC_OR_DEPARTMENT_OTHER): Payer: Self-pay | Admitting: Emergency Medicine

## 2024-02-06 DIAGNOSIS — M549 Dorsalgia, unspecified: Secondary | ICD-10-CM | POA: Diagnosis not present

## 2024-02-06 DIAGNOSIS — M542 Cervicalgia: Secondary | ICD-10-CM | POA: Insufficient documentation

## 2024-02-06 DIAGNOSIS — Z5321 Procedure and treatment not carried out due to patient leaving prior to being seen by health care provider: Secondary | ICD-10-CM | POA: Diagnosis not present

## 2024-02-06 NOTE — ED Notes (Signed)
 No answer for rooming.

## 2024-02-06 NOTE — ED Triage Notes (Signed)
 Neck pain and back pain since January.  No new injury Reports worsening pain. Pain management patient Surgery scheduled for monday

## 2024-02-07 DIAGNOSIS — M545 Low back pain, unspecified: Secondary | ICD-10-CM | POA: Diagnosis not present

## 2024-02-07 DIAGNOSIS — M5412 Radiculopathy, cervical region: Secondary | ICD-10-CM | POA: Diagnosis not present

## 2024-02-07 DIAGNOSIS — R030 Elevated blood-pressure reading, without diagnosis of hypertension: Secondary | ICD-10-CM | POA: Diagnosis not present

## 2024-02-07 DIAGNOSIS — Z79899 Other long term (current) drug therapy: Secondary | ICD-10-CM | POA: Diagnosis not present

## 2024-02-12 ENCOUNTER — Encounter: Payer: Self-pay | Admitting: Family Medicine

## 2024-02-12 DIAGNOSIS — M4722 Other spondylosis with radiculopathy, cervical region: Secondary | ICD-10-CM | POA: Diagnosis not present

## 2024-02-19 DIAGNOSIS — M5412 Radiculopathy, cervical region: Secondary | ICD-10-CM | POA: Diagnosis not present

## 2024-02-19 DIAGNOSIS — M545 Low back pain, unspecified: Secondary | ICD-10-CM | POA: Diagnosis not present

## 2024-02-19 DIAGNOSIS — Z79899 Other long term (current) drug therapy: Secondary | ICD-10-CM | POA: Diagnosis not present

## 2024-02-22 DIAGNOSIS — Z79899 Other long term (current) drug therapy: Secondary | ICD-10-CM | POA: Diagnosis not present

## 2024-02-25 ENCOUNTER — Other Ambulatory Visit: Payer: Self-pay | Admitting: Family Medicine

## 2024-02-25 ENCOUNTER — Encounter: Payer: Self-pay | Admitting: Family Medicine

## 2024-02-27 ENCOUNTER — Ambulatory Visit

## 2024-02-27 ENCOUNTER — Encounter: Payer: 59 | Admitting: Family Medicine

## 2024-02-27 DIAGNOSIS — Z0000 Encounter for general adult medical examination without abnormal findings: Secondary | ICD-10-CM | POA: Diagnosis not present

## 2024-02-27 DIAGNOSIS — Z1231 Encounter for screening mammogram for malignant neoplasm of breast: Secondary | ICD-10-CM

## 2024-02-27 NOTE — Progress Notes (Signed)
 Subjective:   Lisa Newton is a 53 y.o. who presents for a Medicare Wellness preventive visit.  Visit Complete: Virtual I connected with  Lisa Newton on 02/27/24 by a audio enabled telemedicine application and verified that I am speaking with the correct person using two identifiers.  Patient Location: Home  Provider Location: Office/Clinic  I discussed the limitations of evaluation and management by telemedicine. The patient expressed understanding and agreed to proceed.  Vital Signs: Because this visit was a virtual/telehealth visit, some criteria may be missing or patient reported. Any vitals not documented were not able to be obtained and vitals that have been documented are patient reported.  VideoDeclined- This patient declined Librarian, academic. Therefore the visit was completed with audio only.  Persons Participating in Visit: Patient.  AWV Questionnaire: No: Patient Medicare AWV questionnaire was not completed prior to this visit.  Cardiac Risk Factors include: advanced age (>42men, >37 women);dyslipidemia;hypertension;sedentary lifestyle;obesity (BMI >30kg/m2)     Objective:    Today's Vitals   02/27/24 1606  PainSc: 5    There is no height or weight on file to calculate BMI.     02/27/2024    4:10 PM 02/06/2024    1:48 PM 01/16/2024    8:42 AM 02/28/2023    9:30 AM 01/20/2023   10:48 AM 09/06/2022   10:15 AM 08/14/2022   10:33 AM  Advanced Directives  Does Patient Have a Medical Advance Directive? No No No No No No No  Would patient like information on creating a medical advance directive? No - Patient declined No - Patient declined No - Patient declined No - Patient declined No - Patient declined No - Patient declined No - Patient declined    Current Medications (verified) Outpatient Encounter Medications as of 02/27/2024  Medication Sig   amLODipine (NORVASC) 5 MG tablet Take 5 mg by mouth daily.   atorvastatin (LIPITOR)  40 MG tablet Take 1 tablet (40 mg total) by mouth daily.   Azelastine-Fluticasone 137-50 MCG/ACT SUSP Place 1 Act into the nose 2 (two) times daily.   cyclobenzaprine (FLEXERIL) 10 MG tablet Take 1 tablet (10 mg total) by mouth 3 (three) times daily as needed for muscle spasms.   DULoxetine (CYMBALTA) 30 MG capsule TAKE ONE CAPSULE BY MOUTH TWICE DAILY.   fluticasone (FLONASE) 50 MCG/ACT nasal spray Place 2 sprays into both nostrils daily as needed for rhinitis or allergies.   hydrochlorothiazide (HYDRODIURIL) 12.5 MG tablet Take 1 tablet (12.5 mg total) by mouth daily.   hydrOXYzine (ATARAX) 25 MG tablet TAKE 1 TABLET BY MOUTH EVERY 8 HOURS AS NEEDED FOR ANXIETY   levothyroxine (SYNTHROID) 25 MCG tablet Take 25 mcg by mouth daily.   losartan (COZAAR) 100 MG tablet Take 1 tablet (100 mg total) by mouth daily.   losartan-hydrochlorothiazide (HYZAAR) 100-25 MG tablet Take 1 tablet by mouth daily.   meloxicam (MOBIC) 15 MG tablet Take 15 mg by mouth daily.   montelukast (SINGULAIR) 10 MG tablet Take 1 tablet (10 mg total) by mouth at bedtime.   SUMAtriptan (IMITREX) 50 MG tablet TAKE 1 TABLET BY MOUTH, MAY REPEAT IN 2 HOURS IF HEADACHE PERSISTS OR RECURS x ONCE.   thiamine (VITAMIN B-1) 50 MG tablet Take 50 mg by mouth daily.   [DISCONTINUED] gabapentin (NEURONTIN) 300 MG capsule Take 1 capsule (300 mg total) by mouth 3 (three) times daily.   No facility-administered encounter medications on file as of 02/27/2024.    Allergies (verified) Tape,  Augmentin [amoxicillin-pot clavulanate], Bactrim [sulfamethoxazole-trimethoprim], Latex, Other, and Pollen extract   History: Past Medical History:  Diagnosis Date   Abnormal MRI, cervical spine (07/25/2018) 09/03/2018   Disc levels: C2-3: The disc appears normal. Stable mild bilateral facet hypertrophy. No spinal stenosis or nerve root encroachment. C3-4: Stable mild uncinate spurring and facet hypertrophy. No spinal stenosis or nerve root encroachment.  C4-5: Stable chronic spondylosis with posterior osteophytes covering diffusely bulging disc material. The AP diameter of the canal is chronically narrowed to 8 mm   Abnormal MRI, lumbar spine (07/25/2018) 09/03/2018   Disc levels: L4-5: The patient is status post right laminotomy for discectomy. There is some loss of disc space height. Fat about the descending right L4 root is obliterated in the subarticular recess. This is likely due to the presence of granulation tissue. No definite mass effect on the root is seen and there is no mass effect on the thecal sac. Foramina are open. 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   Herniation of cervical intervertebral disc with radiculopathy 03/14/2017   Hyperlipidemia    Hypertension    Hypothyroidism    Issue of medical certificate for disability examination 10/25/2013   Left ear hearing loss    Lumbar radiculitis (Bilateral) 08/06/2018   Neck pain    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   Parotitis, acute 06/11/2018   PONV (postoperative nausea and vomiting)    Poor social situation 09/13/2017   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/2018   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   Past Surgical History:  Procedure Laterality Date   ANTERIOR CERVICAL DECOMP/DISCECTOMY FUSION N/A 05/05/2021   Procedure: Anterior Cervical Decompression Fusion  - Cervical six-Cervical seven;  Surgeon: Coletta Memos, MD;  [facility];  Service: Neurosurgery;  Laterality: N/A;   cholesterol granuloma     left  ear   left ear surgery     ruptured TM   LUMBAR LAMINECTOMY/DECOMPRESSION MICRODISCECTOMY Right 12/14/2017   Procedure: MICRODISCECTOMY LUMBAR FOUR- LUMBAR FIVE - RIGHT;  Surgeon: Coletta Memos, MD;  Location: [facility] OR;  Service: Neurosurgery;  Laterality: Right;  MICRODISCECTOMY LUMBAR 4- LUMBAR 5 - RIGHT   POSTERIOR CERVICAL FUSION/FORAMINOTOMY N/A 03/14/2017   Procedure: LEFT C6-7 FORAMINOTOMY WITH EXCISION OF HERNIATED NUCLEUS PULPOSUS;  Surgeon: Kerrin Champagne, MD;  Location: [facility] OR;  Service: Orthopedics;  Laterality: N/A;   Family History  Problem Relation Age of Onset   Lung cancer Mother 76   Diabetes Father    Hypertension Father    Thyroid disease Sister    Thyroid disease Sister    Eczema Daughter    Stroke Daughter    Mental illness Son    Keloids Son    Asthma Neg Hx    Urticaria Neg Hx    Allergic rhinitis Neg Hx    Breast cancer Neg Hx    Colon cancer Neg Hx    Colon polyps Neg Hx    Esophageal cancer Neg Hx    Rectal cancer Neg Hx    Stomach cancer Neg Hx  Social History   Socioeconomic History   Marital status: Single    Spouse name: Not on file   Number of children: 3   Years of education: some college   Highest education level: 12th grade  Occupational History   Occupation: Disabled   Occupation: Engineer, materials  Tobacco Use   Smoking status: Never    Passive exposure: Past (MOTHER)   Smokeless tobacco: Never  Vaping Use   Vaping status: Never Used  Substance and Sexual Activity   Alcohol use: Not Currently    Alcohol/week: 0.0 standard drinks of alcohol   Drug use: Yes    Frequency: 3.0 times per week    Types: Marijuana    Comment: Sometimes takes edibles - marijuan for pain   Sexual activity: Yes    Birth control/protection: None    Comment: last 03/31/22 - Pt reports no possibility of pregnancy  Other Topics Concern   Not on file  Social History Narrative   Lives in [place] with her youngest daughter.    Patient has three children-  they all live in the area.    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, materials "part time."        Social Drivers of Corporate investment banker Strain: Low Risk  (02/27/2024)   Overall Financial Resource Strain (CARDIA)    Difficulty of Paying Living Expenses: Not hard at all  Food Insecurity: No Food Insecurity (02/27/2024)   Hunger Vital Sign    Worried About Running Out of Food in the Last Year: Never true    Ran Out of Food in the Last Year: Never true  Transportation Needs: No Transportation Needs (02/27/2024)   PRAPARE - Administrator, Civil Service (Medical): No    Lack of Transportation (Non-Medical): No  Physical Activity: Insufficiently Active (02/27/2024)   Exercise Vital Sign    Days of Exercise per Week: 3 days    Minutes of Exercise per Session: 20 min  Stress: No Stress Concern Present (02/27/2024)   [employer] of Occupational Health - Occupational Stress Questionnaire    Feeling of Stress : Not at all  Social Connections: Moderately Isolated (02/27/2024)   Social Connection and Isolation Panel [NHANES]    Frequency of Communication with Friends and Family: More than three times a week    Frequency of Social Gatherings with Friends and Family: More than three times a week    Attends Religious Services: More than 4 times per year    Active Member of [employer] or Organizations: No    Attends Engineer, structural: Never    Marital Status: Divorced    Tobacco Counseling Counseling given: Not Answered    Clinical Intake:  Pre-visit preparation completed: Yes  Pain : 0-10 Pain Score: 5  Pain Type: Chronic pain Pain Location: Neck Pain Orientation: Mid Pain Descriptors / Indicators: Aching, Throbbing, Constant Pain Onset: More than a month ago Pain Frequency: Constant Pain Relieving Factors: MELOXICAM, MUSCLE RELAXERS  Pain Relieving Factors: MELOXICAM, MUSCLE RELAXERS  BMI - recorded: 34.7 Nutritional Status: BMI > 30   Obese Nutritional Risks: None Diabetes: No  How often do you need to have someone help you when you read instructions, pamphlets, or other written materials from your doctor or pharmacy?: 1 - Never  Interpreter Needed?: No  Information entered by :: Kennedy Bucker, LPN   Activities of Daily Living     02/27/2024    4:12 PM  In your present state of  health, do you have any difficulty performing the following activities:  Hearing? 1  Vision? 0  Difficulty concentrating or making decisions? 0  Walking or climbing stairs? 1  Comment BACK PAIN  Dressing or bathing? 0  Doing errands, shopping? 0  Preparing Food and eating ? N  Using the Toilet? N  In the past six months, have you accidently leaked urine? N  Do you have problems with loss of bowel control? N  Managing your Medications? N  Managing your Finances? N  Housekeeping or managing your Housekeeping? N    Patient Care Team: Doreene Eland, MD as PCP - General (Family Medicine) Kerrin Champagne, MD (Inactive) as Consulting Physician (Orthopedic Surgery) Delano Metz, MD as Referring Physician (Pain Medicine) Marlene Bast [facility])  Indicate any recent Medical Services you may have received from other than [facility] providers in the past year (date may be approximate).     Assessment:   This is a routine wellness examination for Karren.  Hearing/Vision screen Hearing Screening - Comments:: WEARS AID FOR LEFT EAR Vision Screening - Comments:: WEARS GLASSES ALL THE TIME- THURMOND   Goals Addressed             This Visit's Progress    DIET - EAT MORE FRUITS AND VEGETABLES         Depression Screen     02/27/2024    4:09 PM 01/16/2024    8:42 AM 12/19/2023    9:30 AM 08/10/2023    3:21 PM 02/28/2023    9:30 AM 01/20/2023   10:49 AM 09/06/2022   10:23 AM  PHQ 2/9 Scores  PHQ - 2 Score 0 2 1 1 3  0 2  PHQ- 9 Score 0 9 10 7 12  0 11    Fall Risk     02/27/2024    4:12 PM 08/10/2023    3:21 PM 01/20/2023    11:21 AM 01/18/2022   10:42 AM 11/29/2019    2:57 PM  Fall Risk   Falls in the past year? 0 0 1 0 0  Number falls in past yr: 0 0 0 0 0  Comment   She fell while trying to get up in the shower. She hit the back of head. She used to have a walk in shower, but now a step in shower    Injury with Fall? 0  0 0 0  Risk for fall due to : No Fall Risks   Orthopedic patient   Follow up Falls prevention discussed;Falls evaluation completed   Falls prevention discussed     MEDICARE RISK AT HOME:  Medicare Risk at Home Any stairs in or around the home?: Yes If so, are there any without handrails?: No Home free of loose throw rugs in walkways, pet beds, electrical cords, etc?: Yes Adequate lighting in your home to reduce risk of falls?: Yes Life alert?: No Use of a cane, walker or w/c?: No Grab bars in the bathroom?: No Shower chair or bench in shower?: No Elevated toilet seat or a handicapped toilet?: No  TIMED UP AND GO:  Was the test performed?  No  Cognitive Function: 6CIT completed        02/27/2024    4:13 PM 01/20/2023   11:18 AM 01/18/2022   10:46 AM  6CIT Screen  What Year? 0 points 0 points 0 points  What month? 0 points 0 points 0 points  What time? 0 points 0 points 0 points  Count back from  20 0 points 0 points 0 points  Months in reverse 0 points 2 points 0 points  Repeat phrase 0 points 0 points 0 points  Total Score 0 points 2 points 0 points    Immunizations Immunization History  Administered Date(s) Administered   Hepatitis B, ADULT 09/14/2022, 10/18/2022, 01/05/2023   Influenza Split 01/11/2012   Influenza,inj,Quad PF,6+ Mos 10/25/2013, 12/03/2014, 10/06/2015   PPD Test 05/20/2011, 03/18/2014, 08/07/2019, 02/10/2020, 08/16/2022   Td 03/12/2004   Tdap 05/08/2018    Screening Tests Health Maintenance  Topic Date Due   COVID-19 Vaccine (1) Never done   Zoster Vaccines- Shingrix (1 of 2) Never done   INFLUENZA VACCINE  03/11/2024 (Originally 07/13/2023)    MAMMOGRAM  03/20/2024   Medicare Annual Wellness (AWV)  02/26/2025   Cervical Cancer Screening (HPV/Pap Cotest)  09/07/2027   DTaP/Tdap/Td (3 - Td or Tdap) 05/08/2028   Colonoscopy  04/20/2032   Hepatitis C Screening  Completed   HIV Screening  Completed   HPV VACCINES  Aged Out    Health Maintenance  Health Maintenance Due  Topic Date Due   COVID-19 Vaccine (1) Never done   Zoster Vaccines- Shingrix (1 of 2) Never done   Health Maintenance Items Addressed: Mammogram ordered COLONOSCOPY UP TO DATE  Additional Screening:  Vision Screening: Recommended annual ophthalmology exams for early detection of glaucoma and other disorders of the eye.  Dental Screening: Recommended annual dental exams for proper oral hygiene  Community Resource Referral / Chronic Care Management: CRR required this visit?  No   CCM required this visit?  No     Plan:     I have personally reviewed and noted the following in the patient's chart:   Medical and social history Use of alcohol, tobacco or illicit drugs  Current medications and supplements including opioid prescriptions. Patient is not currently taking opioid prescriptions. Functional ability and status Nutritional status Physical activity Advanced directives List of other physicians Hospitalizations, surgeries, and ER visits in previous 12 months Vitals Screenings to include cognitive, depression, and falls Referrals and appointments  In addition, I have reviewed and discussed with patient certain preventive protocols, quality metrics, and best practice recommendations. A written personalized care plan for preventive services as well as general preventive health recommendations were provided to patient.     Hal Hope, LPN   [id number]   After Visit Summary: (MyChart) Due to this being a telephonic visit, the after visit summary with patients personalized plan was offered to patient via MyChart   Notes:  REFERRAL SENT FOR  MAMMOGRAM

## 2024-02-27 NOTE — Patient Instructions (Addendum)
 Ms. Batte , Thank you for taking time to come for your Medicare Wellness Visit. I appreciate your ongoing commitment to your health goals. Please review the following plan we discussed and let me know if I can assist you in the future.   Referrals/Orders/Follow-Ups/Clinician Recommendations: REFERRAL SENT FOR MAMMOGRAM  You have an order for:  []   2D Mammogram  [x]   3D Mammogram  []   Bone Density     Please call for appointment:  [facility] [address] [place], [place] [address] [phone] [facility] [address] [place], [place] [address] [phone] [facility] [address] [place], [place] [address] [phone] [facility] Density [address] [place], [place] [address] [phone] [facility] [address] [place], [place] [address] [phone]   Make sure to wear two-piece clothing.  No lotions, powders, or deodorants the day of the appointment. Make sure to bring picture ID and insurance card.  Bring list of medications you are currently taking including any supplements.   Schedule your  screening mammogram through MyChart!   Log into your MyChart account.  Go to 'Visit' (or 'Appointments' if on mobile App) --> Schedule an Appointment  Under 'Select a Reason for Visit' choose the Mammogram Screenin/g option.  Complete the pre-visit questions and select the time and place that best fits your schedule.   This is a list of the screening recommended for you and due dates:  Health Maintenance  Topic Date Due   COVID-19 Vaccine (1) Never done   Zoster (Shingles) Vaccine (1 of 2) Never done   Flu Shot  03/11/2024*   Mammogram  03/20/2024   Medicare Annual Wellness Visit  02/26/2025   Pap with HPV screening  09/07/2027   DTaP/Tdap/Td vaccine (3 - Td or Tdap) 05/08/2028   Colon Cancer Screening  04/20/2032   Hepatitis C  Screening  Completed   HIV Screening  Completed   HPV Vaccine  Aged Out  *Topic was postponed. The date shown is not the original due date.    Advanced directives: (ACP Link)Information on Advanced Care Planning can be found at [facility] Advance Health Care Directives Advance Health Care Directives. [email or web address]   Next Medicare Annual Wellness Visit scheduled for next year: Yes   02/27/25 @ 3:40 PM BY PHONE

## 2024-03-04 DIAGNOSIS — Z79899 Other long term (current) drug therapy: Secondary | ICD-10-CM | POA: Diagnosis not present

## 2024-03-04 DIAGNOSIS — M5412 Radiculopathy, cervical region: Secondary | ICD-10-CM | POA: Diagnosis not present

## 2024-03-04 DIAGNOSIS — M545 Low back pain, unspecified: Secondary | ICD-10-CM | POA: Diagnosis not present

## 2024-03-07 ENCOUNTER — Other Ambulatory Visit: Payer: Self-pay | Admitting: Family Medicine

## 2024-03-07 DIAGNOSIS — N6310 Unspecified lump in the right breast, unspecified quadrant: Secondary | ICD-10-CM

## 2024-03-13 ENCOUNTER — Ambulatory Visit (HOSPITAL_BASED_OUTPATIENT_CLINIC_OR_DEPARTMENT_OTHER): Attending: Neurosurgery | Admitting: Physical Therapy

## 2024-03-13 ENCOUNTER — Other Ambulatory Visit: Payer: Self-pay

## 2024-03-13 DIAGNOSIS — M62838 Other muscle spasm: Secondary | ICD-10-CM | POA: Diagnosis not present

## 2024-03-13 DIAGNOSIS — R2689 Other abnormalities of gait and mobility: Secondary | ICD-10-CM | POA: Diagnosis not present

## 2024-03-13 DIAGNOSIS — M5459 Other low back pain: Secondary | ICD-10-CM | POA: Insufficient documentation

## 2024-03-13 DIAGNOSIS — M542 Cervicalgia: Secondary | ICD-10-CM | POA: Diagnosis not present
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
## 2024-03-14 ENCOUNTER — Encounter (HOSPITAL_BASED_OUTPATIENT_CLINIC_OR_DEPARTMENT_OTHER): Payer: Self-pay | Admitting: Physical Therapy

## 2024-04-04 DIAGNOSIS — M545 Low back pain, unspecified: Secondary | ICD-10-CM | POA: Diagnosis not present

## 2024-04-04 DIAGNOSIS — Z79899 Other long term (current) drug therapy: Secondary | ICD-10-CM | POA: Diagnosis not present

## 2024-04-04 DIAGNOSIS — M5412 Radiculopathy, cervical region: Secondary | ICD-10-CM | POA: Diagnosis not present

## 2024-04-05 ENCOUNTER — Other Ambulatory Visit

## 2024-04-05 ENCOUNTER — Encounter

## 2024-04-09 ENCOUNTER — Ambulatory Visit (HOSPITAL_BASED_OUTPATIENT_CLINIC_OR_DEPARTMENT_OTHER): Admitting: Physical Therapy

## 2024-04-09 ENCOUNTER — Telehealth (HOSPITAL_BASED_OUTPATIENT_CLINIC_OR_DEPARTMENT_OTHER): Payer: Self-pay | Admitting: Physical Therapy

## 2024-04-09 NOTE — Telephone Encounter (Signed)
 Patient did not show for aquatic PT appointment.  Called and spoke with patient.  She stated that she thought her appointment was tomorrow.  Confirmed next 2 upcoming appointments with her.  Almedia Jacobsen, PTA 04/09/24 4:10 PM [facility] [address] [place], [place], [address] Phone: [phone]   Fax:  [phone]

## 2024-04-13 ENCOUNTER — Ambulatory Visit (HOSPITAL_BASED_OUTPATIENT_CLINIC_OR_DEPARTMENT_OTHER): Admitting: Physical Therapy

## 2024-04-17 ENCOUNTER — Telehealth (HOSPITAL_BASED_OUTPATIENT_CLINIC_OR_DEPARTMENT_OTHER): Payer: Self-pay | Admitting: Physical Therapy

## 2024-04-17 ENCOUNTER — Ambulatory Visit (HOSPITAL_BASED_OUTPATIENT_CLINIC_OR_DEPARTMENT_OTHER): Admitting: Physical Therapy

## 2024-04-17 NOTE — Telephone Encounter (Signed)
 The patient left a voice message requesting to cancel all remaining appointments, stating that she has returned to work and feels too exhausted to continue physical therapy after her shifts. She does not plan to resume PT at this time

## 2024-04-20 ENCOUNTER — Encounter (HOSPITAL_BASED_OUTPATIENT_CLINIC_OR_DEPARTMENT_OTHER): Admitting: Physical Therapy

## 2024-04-23 ENCOUNTER — Emergency Department (HOSPITAL_COMMUNITY)

## 2024-04-23 ENCOUNTER — Other Ambulatory Visit: Payer: Self-pay

## 2024-04-23 ENCOUNTER — Emergency Department (HOSPITAL_COMMUNITY)
Admission: EM | Admit: 2024-04-23 | Discharge: 2024-04-23 | Disposition: A | Discharge status: Home / Self Care | Attending: Emergency Medicine | Admitting: Emergency Medicine

## 2024-04-23 ENCOUNTER — Encounter (HOSPITAL_COMMUNITY): Payer: Self-pay

## 2024-04-23 ENCOUNTER — Telehealth: Payer: Self-pay | Admitting: Family Medicine

## 2024-04-23 DIAGNOSIS — H9202 Otalgia, left ear: Secondary | ICD-10-CM | POA: Diagnosis not present

## 2024-04-23 DIAGNOSIS — R1110 Vomiting, unspecified: Secondary | ICD-10-CM | POA: Diagnosis not present

## 2024-04-23 DIAGNOSIS — R519 Headache, unspecified: Secondary | ICD-10-CM | POA: Insufficient documentation

## 2024-04-23 DIAGNOSIS — R112 Nausea with vomiting, unspecified: Secondary | ICD-10-CM | POA: Diagnosis not present

## 2024-04-23 DIAGNOSIS — D72829 Elevated white blood cell count, unspecified: Secondary | ICD-10-CM | POA: Insufficient documentation

## 2024-04-23 DIAGNOSIS — R110 Nausea: Secondary | ICD-10-CM | POA: Diagnosis not present

## 2024-04-23 DIAGNOSIS — E876 Hypokalemia: Secondary | ICD-10-CM | POA: Insufficient documentation

## 2024-04-23 DIAGNOSIS — H70209 Unspecified petrositis, unspecified ear: Secondary | ICD-10-CM

## 2024-04-23 DIAGNOSIS — R6889 Other general symptoms and signs: Secondary | ICD-10-CM | POA: Diagnosis not present

## 2024-04-23 DIAGNOSIS — Z91040 Latex allergy status: Secondary | ICD-10-CM | POA: Insufficient documentation

## 2024-04-23 DIAGNOSIS — Z743 Need for continuous supervision: Secondary | ICD-10-CM | POA: Diagnosis not present

## 2024-04-23 DIAGNOSIS — G8929 Other chronic pain: Secondary | ICD-10-CM

## 2024-04-23 LAB — CBC WITH DIFFERENTIAL/PLATELET
Abs Immature Granulocytes: 0.06 10*3/uL (ref 0.00–0.07)
Basophils Absolute: 0 10*3/uL (ref 0.0–0.1)
Basophils Relative: 0 %
Eosinophils Absolute: 0.1 10*3/uL (ref 0.0–0.5)
Eosinophils Relative: 1 %
HCT: 47.2 % — ABNORMAL HIGH (ref 36.0–46.0)
Hemoglobin: 15.5 g/dL — ABNORMAL HIGH (ref 12.0–15.0)
Immature Granulocytes: 1 %
Lymphocytes Relative: 10 %
Lymphs Abs: 1.1 10*3/uL (ref 0.7–4.0)
MCH: 31.3 pg (ref 26.0–34.0)
MCHC: 32.8 g/dL (ref 30.0–36.0)
MCV: 95.2 fL (ref 80.0–100.0)
Monocytes Absolute: 0.4 10*3/uL (ref 0.1–1.0)
Monocytes Relative: 3 %
Neutro Abs: 9.8 10*3/uL — ABNORMAL HIGH (ref 1.7–7.7)
Neutrophils Relative %: 85 %
Platelets: 233 10*3/uL (ref 150–400)
RBC: 4.96 MIL/uL (ref 3.87–5.11)
RDW: 13 % (ref 11.5–15.5)
WBC: 11.5 10*3/uL — ABNORMAL HIGH (ref 4.0–10.5)
nRBC: 0 % (ref 0.0–0.2)

## 2024-04-23 LAB — COMPREHENSIVE METABOLIC PANEL WITH GFR
ALT: 19 U/L (ref 0–44)
AST: 26 U/L (ref 15–41)
Albumin: 4.5 g/dL (ref 3.5–5.0)
Alkaline Phosphatase: 80 U/L (ref 38–126)
Anion gap: 9 (ref 5–15)
BUN: 9 mg/dL (ref 6–20)
CO2: 25 mmol/L (ref 22–32)
Calcium: 9.5 mg/dL (ref 8.9–10.3)
Chloride: 102 mmol/L (ref 98–111)
Creatinine, Ser: 0.73 mg/dL (ref 0.44–1.00)
GFR, Estimated: >60 mL/min (ref >60)
Glucose, Bld: 87 mg/dL (ref 70–99)
Potassium: 3.3 mmol/L — ABNORMAL LOW (ref 3.5–5.1)
Sodium: 136 mmol/L (ref 135–145)
Total Bilirubin: 1 mg/dL (ref 0.0–1.2)
Total Protein: 8.3 g/dL — ABNORMAL HIGH (ref 6.5–8.1)

## 2024-04-23 LAB — RESP PANEL BY RT-PCR (RSV, FLU A&B, COVID)  RVPGX2
Influenza A by PCR: NEGATIVE
Influenza B by PCR: NEGATIVE
Resp Syncytial Virus by PCR: NEGATIVE
SARS Coronavirus 2 by RT PCR: NEGATIVE

## 2024-04-23 LAB — LIPASE, BLOOD: Lipase: 29 U/L (ref 11–51)

## 2024-04-23 MED ORDER — ONDANSETRON HCL 4 MG PO TABS: 4.0000 mg | ORAL_TABLET | Freq: Three times a day (TID) | ORAL | 0 refills | Status: AC | PRN

## 2024-04-23 MED ORDER — KETOROLAC TROMETHAMINE 30 MG/ML IJ SOLN
30.0000 mg | Freq: Once | INTRAMUSCULAR | Status: AC
  Administered 2024-04-23: 30 mg via INTRAVENOUS
  Filled 2024-04-23: qty 1

## 2024-04-23 MED ORDER — DEXAMETHASONE SODIUM PHOSPHATE 10 MG/ML IJ SOLN
10.0000 mg | Freq: Once | INTRAMUSCULAR | Status: AC
  Administered 2024-04-23: 10 mg via INTRAVENOUS
  Filled 2024-04-23: qty 1

## 2024-04-23 MED ORDER — METOCLOPRAMIDE HCL 5 MG/ML IJ SOLN
10.0000 mg | Freq: Once | INTRAMUSCULAR | Status: AC
  Administered 2024-04-23: 10 mg via INTRAVENOUS
  Filled 2024-04-23: qty 2

## 2024-04-23 NOTE — ED Triage Notes (Signed)
 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 Patient had diarrhea last week x 5 days that has resolved. Reports that a child at school had same. Patient concerned that she may be withdrawing from percocet 10mg  x 4 per day that she has been taking for a while for chronic back and neck pain. Stopped the percocet Friday and trying lidocaine  patch. Alert and oriented. Denies head trauma. Pain to head more frontal. No visual changes, no abdominal pain nor cramping.

## 2024-04-23 NOTE — Telephone Encounter (Signed)
 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

## 2024-04-23 NOTE — Telephone Encounter (Signed)
 Does she now have ENT appointment for 5/29 and also on the wait list? Thanks.

## 2024-04-23 NOTE — ED Notes (Signed)
 Patient stated that she was ready to go, patient was educated on the importance of staying so that we could get her labs back and we can make sure she was good before she left.

## 2024-04-23 NOTE — ED Provider Notes (Signed)
 [place] EMERGENCY DEPARTMENT AT [facility] Provider Note   CSN: [id number] Arrival date & time: 04/23/24  0940     History  Chief Complaint  Patient presents with   Nausea   Emesis   Back Pain   Headache    Lisa Newton is a 53 y.o. female [facility] emergency department with multiple complaints.  Chiefly patient is coming in today due to nausea and vomiting.  She also has a bad headache pain in her left ear with a history of cholesteatoma and pain all over her body.  Symptoms of vomiting started today.  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 She also has been on Percocet 10 mg regularly for a long time for her chronic back pain issues but abruptly stopped taking them 5 days ago.  She is having increased pain all over.  She has an associated throbbing headache which is unusual for her.  She has pain in her neck and lower back.  She also has myalgias all over.  She denies fevers but does have associated chills and fatigue.   Emesis Associated symptoms: headaches   Back Pain Associated symptoms: headaches   Headache Associated symptoms: back pain and vomiting        Home Medications Prior to Admission medications   Medication Sig Start Date End Date Taking? Authorizing Provider  amLODipine  (NORVASC ) 5 MG tablet Take 5 mg by mouth daily. Patient not taking: Reported on 03/13/2024 11/01/23   [provider]  atorvastatin  (LIPITOR) 40 MG tablet Take 1 tablet (40 mg total) by mouth daily. 12/19/23   Arn Lane, MD  Azelastine -Fluticasone  137-50 MCG/ACT SUSP Place 1 Act into the nose 2 (two) times daily. 02/28/23   Arn Lane, MD  cyclobenzaprine  (FLEXERIL ) 10 MG tablet Take 1 tablet (10 mg total) by mouth 3 (three) times daily as needed for muscle spasms. 10/30/23   Buena Carmine, NP  DULoxetine  (CYMBALTA ) 30 MG capsule TAKE ONE CAPSULE BY MOUTH TWICE DAILY. 12/18/23    Arn Lane, MD  fluticasone  (FLONASE ) 50 MCG/ACT nasal spray Place 2 sprays into both nostrils daily as needed for rhinitis or allergies. 01/20/23   Arn Lane, MD  hydrochlorothiazide  (HYDRODIURIL ) 12.5 MG tablet Take 1 tablet (12.5 mg total) by mouth daily. 12/19/23   Arn Lane, MD  hydrOXYzine  (ATARAX ) 25 MG tablet TAKE 1 TABLET BY MOUTH EVERY 8 HOURS AS NEEDED FOR ANXIETY 02/26/24   Arn Lane, MD  levothyroxine  (SYNTHROID ) 25 MCG tablet Take 25 mcg by mouth daily. Patient not taking: Reported on 03/13/2024    [provider]  losartan  (COZAAR ) 100 MG tablet Take 1 tablet (100 mg total) by mouth daily. 12/19/23   Arn Lane, MD  losartan -hydrochlorothiazide  (HYZAAR) 100-25 MG tablet Take 1 tablet by mouth daily. Patient not taking: Reported on 03/13/2024 11/01/23   [provider]  meloxicam  (MOBIC ) 15 MG tablet Take 15 mg by mouth daily.    [provider]  montelukast  (SINGULAIR ) 10 MG tablet Take 1 tablet (10 mg total) by mouth at bedtime. 08/10/23   Goble Last, MD  SUMAtriptan  (IMITREX ) 50 MG tablet TAKE 1 TABLET BY MOUTH, MAY REPEAT IN 2 HOURS IF HEADACHE PERSISTS OR RECURS x ONCE. Patient not taking: Reported on 03/13/2024 02/28/23   Arn Lane, MD  thiamine (VITAMIN B-1) 50 MG tablet Take 50 mg by mouth daily.  [provider]  gabapentin  (NEURONTIN ) 300 MG capsule Take 1 capsule (300 mg total) by mouth 3 (three) times daily. 03/08/23 03/09/23  Diedra Fowler, MD      Allergies    Tape, Augmentin  [amoxicillin -pot clavulanate], Bactrim  [sulfamethoxazole -trimethoprim ], Latex, Other, and Pollen extract    Review of Systems   Review of Systems  Gastrointestinal:  Positive for vomiting.  Musculoskeletal:  Positive for back pain.  Neurological:  Positive for headaches.    Physical Exam Updated Vital Signs BP (!) 170/98 (BP Location: Left Arm)   Pulse 91   Temp 99.1 F (37.3 C) (Oral)   Resp 17   Ht 5\' 6"   (1.676 m)   Wt 97.9 kg   SpO2 99%   BMI 34.84 kg/m  Physical Exam Vitals and nursing note reviewed.  Constitutional:      General: She is not in acute distress.    Appearance: She is well-developed. She is not diaphoretic.  HENT:     Head: Normocephalic and atraumatic.     Right Ear: Tympanic membrane and external ear normal.     Left Ear: External ear normal.     Ears:     Comments: Debris versus infection behind the left TM    Nose: Nose normal.     Mouth/Throat:     Mouth: Mucous membranes are moist.  Eyes:     General: No scleral icterus.    Conjunctiva/sclera: Conjunctivae normal.  Cardiovascular:     Rate and Rhythm: Normal rate and regular rhythm.     Heart sounds: Normal heart sounds. No murmur heard.    No friction rub. No gallop.  Pulmonary:     Effort: Pulmonary effort is normal. No respiratory distress.     Breath sounds: Normal breath sounds.  Abdominal:     General: Bowel sounds are normal. There is no distension.     Palpations: Abdomen is soft. There is no mass.     Tenderness: There is no abdominal tenderness. There is no guarding.  Musculoskeletal:     Cervical back: Normal range of motion.  Skin:    General: Skin is warm and dry.  Neurological:     General: No focal deficit present.     Mental Status: She is alert and oriented to person, place, and time.     Cranial Nerves: No cranial nerve deficit.     Sensory: No sensory deficit.     Motor: No weakness.     Coordination: Coordination normal.     Gait: Gait normal.     Deep Tendon Reflexes: Reflexes normal.  Psychiatric:        Behavior: Behavior normal.     ED Results / Procedures / Treatments   Labs (all labs ordered are listed, but only abnormal results are displayed) Labs Reviewed  RESP PANEL BY RT-PCR (RSV, FLU A&B, COVID)  [id number]  COMPREHENSIVE METABOLIC PANEL WITH GFR  LIPASE, BLOOD  CBC WITH DIFFERENTIAL/PLATELET  PREGNANCY, URINE  URINALYSIS, ROUTINE W REFLEX MICROSCOPIC     EKG None  Radiology No results found.  Procedures Procedures    Medications Ordered in ED Medications  ketorolac  (TORADOL ) 30 MG/ML injection 30 mg (has no administration in time range)  dexamethasone  (DECADRON ) injection 10 mg (has no administration in time range)  metoCLOPramide  (REGLAN ) injection 10 mg (has no administration in time range)    ED Course/ Medical Decision Making/ A&P Clinical Course as of 04/23/24 1616  Tue Apr 23, 2024  1305 Potassium(!): 3.3 [AH]  Clinical Course User Index [AH] Tama Fails, PA-C                                 Medical Decision Making Amount and/or Complexity of Data Reviewed Labs: ordered. Decision-making details documented in ED Course. Radiology: ordered.  Risk Prescription drug management.   Patient here with nausea vomiting headache and ear pain. History of suppose it cholesteatoma, previous mastoidectomy.  No evidence of meningitis. I ordered CT head and temporal bones.  I visualized and interpreted results and agree with radiologic interpretation. Patient has chronic appearing infection of the left ear.  I discussed the case with Dr. Virgia Griffins who is recommending that she follow closely with tertiary care at [facility] whom she has previously seen as her issues are beyond the scope of care of ENT in [facility] and she will need a specific otologist to follow-up with.  Her pain is resolved after medications including Decadron , Toradol  and Reglan .  She also has no vomiting.  The emergent differential diagnosis for vomiting includes, but is not limited to ACS/MI, DKA, Ischemic bowel, Meningitis, Sepsis, Acute gastric dilation, Adrenal insufficiency, Appendicitis,  Bowel obstruction/ileus, Carbon monoxide poisoning, Cholecystitis, Electrolyte abnormalities, Elevated ICP, Gastric outlet obstruction, Pancreatitis, Ruptured viscus, Biliary colic, Cannabinoid hyperemesis syndrome, Gastritis, Gastroenteritis, Gastroparesis,   Narcotic withdrawal, Peptic ulcer disease, and UTI   I ordered and reviewed labs she has mild hypokalemia of insignificant value, no other acute findings. Abdominal exam is benign and I do not think she has any intra-abdominal pathology.  Overall I think her vomiting symptoms were likely due to viral infection as she has had exposure to multiple children with similar symptoms.  She is feeling significantly improved after medications.  Patient also abruptly withdrew herself from 10 mg oxycodone  which she had been taking 4 times daily for a long time.  I do not think that the patient's symptoms today are specifically due to opiate withdrawal but did state that it might be contributing to her overall pain issues and that it would be better to walk herself down over time and we discussed  a taper regimen.  I am also recommending that she take naproxen  twice daily and follow-up with her primary care physician.  I reached out to patient's PCP to establish tertiary care follow-up at [facility].  She appears otherwise appropriate for discharge at this time.         Final Clinical Impression(s) / ED Diagnoses Final diagnoses:  None    Rx / DC Orders ED Discharge Orders     None         Tama Fails, PA-C 04/23/24 1619    Burnette Carte, MD 04/24/24 0830

## 2024-04-23 NOTE — Telephone Encounter (Signed)
 Scheduled patient an appt on 05/20 @230  with Dr.Sowell

## 2024-04-23 NOTE — ED Notes (Signed)
 Unsuccessful IV attempt x2, another RN asked to assist.

## 2024-04-23 NOTE — ED Notes (Signed)
 ED Provider at bedside.

## 2024-04-23 NOTE — Discharge Instructions (Signed)
Continue frequent small sips (10-20 ml) of clear liquids every 5-10 minutes. Gatorade or powerade are good options. Avoid milk, orange juice, and grape juice for now. . Once you have not had further vomiting with the small sips for 4 hours, you may begin to drink larger volumes of fluids at a time and try a bland diet which may include saltine crackers, applesauce, breads, pastas, bananas, bland chicken. If you continues to vomit despite medication, return to the ED for repeat evaluation. Otherwise, follow up with your doctor in 2-3 days for a re-check.  

## 2024-04-23 NOTE — Telephone Encounter (Signed)
-----   Message from Tama Fails sent at 04/23/2024  2:54 PM EDT ----- Dr. Grandville Lax, I saw this patient in the ED . I did a repeat temporal ct  and spoke with Dr. Virgia Griffins on call for ENT. I tried to get her an appointment at [facility] in [place] but was unable to get through on the referral line. Dr. Virgia Griffins states that she will need ENT follow up in [place] regarding her chronic/quiescent Left Petrous Apicitis. He says this is going to need a specialist. Because I am in the ER, I am not going to have a call back number in the ER.  I was hoping you might get your staff to handle it. Referral number is [phone]. Thank you

## 2024-04-23 NOTE — ED Notes (Signed)
 Patient refused discharge vitals.

## 2024-04-24 ENCOUNTER — Ambulatory Visit (HOSPITAL_BASED_OUTPATIENT_CLINIC_OR_DEPARTMENT_OTHER): Admitting: Physical Therapy

## 2024-04-25 ENCOUNTER — Ambulatory Visit: Admitting: Family Medicine

## 2024-04-26 ENCOUNTER — Ambulatory Visit: Admitting: Family Medicine

## 2024-04-26 ENCOUNTER — Encounter (HOSPITAL_BASED_OUTPATIENT_CLINIC_OR_DEPARTMENT_OTHER): Admitting: Physical Therapy

## 2024-04-29 DIAGNOSIS — H918X3 Other specified hearing loss, bilateral: Secondary | ICD-10-CM | POA: Diagnosis not present

## 2024-04-29 DIAGNOSIS — H9202 Otalgia, left ear: Secondary | ICD-10-CM | POA: Diagnosis not present

## 2024-04-29 DIAGNOSIS — H9212 Otorrhea, left ear: Secondary | ICD-10-CM | POA: Diagnosis not present

## 2024-04-29 DIAGNOSIS — H70209 Unspecified petrositis, unspecified ear: Secondary | ICD-10-CM | POA: Diagnosis not present

## 2024-04-29 DIAGNOSIS — H7112 Cholesteatoma of tympanum, left ear: Secondary | ICD-10-CM | POA: Diagnosis not present

## 2024-04-30 ENCOUNTER — Ambulatory Visit (HOSPITAL_BASED_OUTPATIENT_CLINIC_OR_DEPARTMENT_OTHER): Admitting: Physical Therapy

## 2024-04-30 ENCOUNTER — Ambulatory Visit: Admitting: Student

## 2024-05-01 DIAGNOSIS — Z79899 Other long term (current) drug therapy: Secondary | ICD-10-CM | POA: Diagnosis not present

## 2024-05-01 DIAGNOSIS — M545 Low back pain, unspecified: Secondary | ICD-10-CM | POA: Diagnosis not present

## 2024-05-01 DIAGNOSIS — M5412 Radiculopathy, cervical region: Secondary | ICD-10-CM | POA: Diagnosis not present

## 2024-05-03 ENCOUNTER — Encounter (HOSPITAL_BASED_OUTPATIENT_CLINIC_OR_DEPARTMENT_OTHER): Admitting: Physical Therapy

## 2024-05-03 DIAGNOSIS — Z79899 Other long term (current) drug therapy: Secondary | ICD-10-CM | POA: Diagnosis not present

## 2024-05-07 ENCOUNTER — Encounter: Payer: Self-pay | Admitting: Family Medicine

## 2024-05-07 ENCOUNTER — Ambulatory Visit (HOSPITAL_BASED_OUTPATIENT_CLINIC_OR_DEPARTMENT_OTHER): Admitting: Physical Therapy

## 2024-05-08 ENCOUNTER — Ambulatory Visit
Admission: RE | Admit: 2024-05-08 | Discharge: 2024-05-08 | Disposition: A | Discharge status: Home / Self Care | Source: Ambulatory Visit | Attending: Family Medicine | Admitting: Family Medicine

## 2024-05-08 DIAGNOSIS — N6310 Unspecified lump in the right breast, unspecified quadrant: Secondary | ICD-10-CM

## 2024-05-08 DIAGNOSIS — N6315 Unspecified lump in the right breast, overlapping quadrants: Secondary | ICD-10-CM | POA: Diagnosis not present

## 2024-05-08 NOTE — Telephone Encounter (Signed)
 Left form in your box.  Patient stated that she will need this form back by next Thursday so she can receive financial aid.

## 2024-05-08 NOTE — Telephone Encounter (Signed)
 Patient stated that she will just wait until she sees you on the 6th of June to have it filled out.

## 2024-05-10 ENCOUNTER — Encounter (HOSPITAL_BASED_OUTPATIENT_CLINIC_OR_DEPARTMENT_OTHER): Admitting: Physical Therapy

## 2024-05-13 ENCOUNTER — Encounter: Payer: Self-pay | Admitting: Family Medicine

## 2024-05-13 ENCOUNTER — Ambulatory Visit (INDEPENDENT_AMBULATORY_CARE_PROVIDER_SITE_OTHER): Admitting: Family Medicine

## 2024-05-13 VITALS — BP 136/86 | HR 95 | Ht 66.0 in | Wt 220.2 lb

## 2024-05-13 DIAGNOSIS — Z020 Encounter for examination for admission to educational institution: Secondary | ICD-10-CM | POA: Diagnosis not present

## 2024-05-13 DIAGNOSIS — I10 Essential (primary) hypertension: Secondary | ICD-10-CM | POA: Diagnosis not present

## 2024-05-13 NOTE — Progress Notes (Signed)
    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  Wanted to have BP checked and needs form signed for school clearance  On HCTZ and losartan , denies concerns, tolerating well  PERTINENT  PMH / PSH: HTN  OBJECTIVE:   BP 136/86   Pulse 95   Ht 5\' 6"  (1.676 m)   Wt 220 lb 3.2 oz (99.9 kg)   SpO2 100%   BMI 35.54 kg/m   General: NAD, pleasant, able to participate in exam Respiratory: No respiratory distress Skin: warm and dry, no rashes noted Psych: Normal affect and mood  ASSESSMENT/PLAN:   Assessment & Plan Essential hypertension Stable on current regimen, losartan  and HCTZ, BP WNL today Encounter for school examination Signed form for school clearance No concerns identified at this time Has PCP f/u scheduled   Edison Gore, MD [facility]

## 2024-05-13 NOTE — Assessment & Plan Note (Signed)
 Stable on current regimen, losartan  and HCTZ, BP WNL today

## 2024-05-17 ENCOUNTER — Ambulatory Visit: Admitting: Family Medicine

## 2024-05-23 DIAGNOSIS — H918X3 Other specified hearing loss, bilateral: Secondary | ICD-10-CM | POA: Diagnosis not present

## 2024-05-23 DIAGNOSIS — H6122 Impacted cerumen, left ear: Secondary | ICD-10-CM | POA: Diagnosis not present

## 2024-05-23 DIAGNOSIS — H70209 Unspecified petrositis, unspecified ear: Secondary | ICD-10-CM | POA: Diagnosis not present

## 2024-05-23 DIAGNOSIS — H9202 Otalgia, left ear: Secondary | ICD-10-CM | POA: Diagnosis not present

## 2024-05-31 DIAGNOSIS — M5412 Radiculopathy, cervical region: Secondary | ICD-10-CM | POA: Diagnosis not present

## 2024-05-31 DIAGNOSIS — M545 Low back pain, unspecified: Secondary | ICD-10-CM | POA: Diagnosis not present

## 2024-05-31 DIAGNOSIS — Z79899 Other long term (current) drug therapy: Secondary | ICD-10-CM | POA: Diagnosis not present

## 2024-06-06 DIAGNOSIS — Z79899 Other long term (current) drug therapy: Secondary | ICD-10-CM | POA: Diagnosis not present

## 2024-07-01 ENCOUNTER — Other Ambulatory Visit: Payer: Self-pay | Admitting: Family Medicine

## 2024-07-01 DIAGNOSIS — Z79899 Other long term (current) drug therapy: Secondary | ICD-10-CM | POA: Diagnosis not present

## 2024-07-01 DIAGNOSIS — M5412 Radiculopathy, cervical region: Secondary | ICD-10-CM | POA: Diagnosis not present

## 2024-07-01 DIAGNOSIS — M545 Low back pain, unspecified: Secondary | ICD-10-CM | POA: Diagnosis not present

## 2024-07-01 DIAGNOSIS — I10 Essential (primary) hypertension: Secondary | ICD-10-CM

## 2024-07-04 ENCOUNTER — Other Ambulatory Visit: Payer: Self-pay | Admitting: Student

## 2024-07-04 DIAGNOSIS — R059 Cough, unspecified: Secondary | ICD-10-CM

## 2024-07-25 ENCOUNTER — Other Ambulatory Visit: Payer: Self-pay | Admitting: Family Medicine

## 2024-09-05 DIAGNOSIS — M545 Low back pain, unspecified: Secondary | ICD-10-CM | POA: Diagnosis not present

## 2024-09-05 DIAGNOSIS — Z79899 Other long term (current) drug therapy: Secondary | ICD-10-CM | POA: Diagnosis not present

## 2024-09-13 ENCOUNTER — Ambulatory Visit

## 2024-09-17 ENCOUNTER — Ambulatory Visit: Admitting: Family Medicine

## 2024-09-17 DIAGNOSIS — M4722 Other spondylosis with radiculopathy, cervical region: Secondary | ICD-10-CM | POA: Diagnosis not present

## 2024-09-19 DIAGNOSIS — M4722 Other spondylosis with radiculopathy, cervical region: Secondary | ICD-10-CM | POA: Diagnosis not present

## 2024-09-27 ENCOUNTER — Ambulatory Visit: Admitting: Family Medicine

## 2024-10-31 ENCOUNTER — Ambulatory Visit (HOSPITAL_BASED_OUTPATIENT_CLINIC_OR_DEPARTMENT_OTHER): Attending: Neurosurgery | Admitting: Physical Therapy

## 2024-10-31 ENCOUNTER — Encounter (HOSPITAL_BASED_OUTPATIENT_CLINIC_OR_DEPARTMENT_OTHER): Payer: Self-pay | Admitting: Physical Therapy

## 2024-10-31 ENCOUNTER — Other Ambulatory Visit: Payer: Self-pay

## 2024-10-31 DIAGNOSIS — M62838 Other muscle spasm: Secondary | ICD-10-CM | POA: Diagnosis present

## 2024-10-31 DIAGNOSIS — R293 Abnormal posture: Secondary | ICD-10-CM | POA: Diagnosis present

## 2024-10-31 DIAGNOSIS — M542 Cervicalgia: Secondary | ICD-10-CM | POA: Insufficient documentation
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
## 2024-11-01 ENCOUNTER — Encounter: Payer: Self-pay | Admitting: Pharmacist

## 2024-11-01 NOTE — Progress Notes (Signed)
 This patient is appearing on a report for being at risk of failing the adherence measure for hypertension (ACEi/ARB) medications this calendar year.   Medication: Losartan  100 mg Last fill date: 11/18 for 90 day supply

## 2024-11-06 ENCOUNTER — Ambulatory Visit (HOSPITAL_BASED_OUTPATIENT_CLINIC_OR_DEPARTMENT_OTHER): Admitting: Physical Therapy

## 2024-11-15 ENCOUNTER — Encounter (HOSPITAL_BASED_OUTPATIENT_CLINIC_OR_DEPARTMENT_OTHER): Payer: Self-pay

## 2024-11-15 ENCOUNTER — Ambulatory Visit (HOSPITAL_BASED_OUTPATIENT_CLINIC_OR_DEPARTMENT_OTHER)

## 2024-11-15 ENCOUNTER — Ambulatory Visit (HOSPITAL_BASED_OUTPATIENT_CLINIC_OR_DEPARTMENT_OTHER): Payer: Self-pay | Attending: Neurosurgery

## 2024-11-15 DIAGNOSIS — R293 Abnormal posture: Secondary | ICD-10-CM | POA: Insufficient documentation

## 2024-11-15 DIAGNOSIS — M62838 Other muscle spasm: Secondary | ICD-10-CM | POA: Insufficient documentation

## 2024-11-15 DIAGNOSIS — M542 Cervicalgia: Secondary | ICD-10-CM | POA: Insufficient documentation
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
## 2024-11-25 ENCOUNTER — Ambulatory Visit (HOSPITAL_BASED_OUTPATIENT_CLINIC_OR_DEPARTMENT_OTHER)

## 2024-11-25 ENCOUNTER — Encounter (HOSPITAL_BASED_OUTPATIENT_CLINIC_OR_DEPARTMENT_OTHER): Payer: Self-pay

## 2024-11-25 DIAGNOSIS — M542 Cervicalgia: Secondary | ICD-10-CM

## 2024-11-25 DIAGNOSIS — M62838 Other muscle spasm: Secondary | ICD-10-CM

## 2024-11-25 DIAGNOSIS — R293 Abnormal posture: Secondary | ICD-10-CM
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
## 2024-12-03 ENCOUNTER — Encounter (HOSPITAL_BASED_OUTPATIENT_CLINIC_OR_DEPARTMENT_OTHER): Payer: Self-pay

## 2024-12-03 ENCOUNTER — Ambulatory Visit (HOSPITAL_BASED_OUTPATIENT_CLINIC_OR_DEPARTMENT_OTHER)

## 2024-12-03 DIAGNOSIS — M62838 Other muscle spasm: Secondary | ICD-10-CM

## 2024-12-03 DIAGNOSIS — M542 Cervicalgia: Secondary | ICD-10-CM

## 2024-12-03 DIAGNOSIS — R293 Abnormal posture: Secondary | ICD-10-CM

## 2024-12-03 NOTE — Therapy (Signed)
 Pt arrived but not seen. Pt stated she wanted to go home and rest. Rescheduled for Friday.
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
## 2024-12-06 ENCOUNTER — Ambulatory Visit (HOSPITAL_BASED_OUTPATIENT_CLINIC_OR_DEPARTMENT_OTHER): Payer: Self-pay | Admitting: Physical Therapy

## 2024-12-10 ENCOUNTER — Ambulatory Visit (HOSPITAL_BASED_OUTPATIENT_CLINIC_OR_DEPARTMENT_OTHER)

## 2024-12-11 ENCOUNTER — Ambulatory Visit

## 2024-12-11 VITALS — BP 153/90 | HR 88 | Temp 99.0°F | Wt 219.6 lb

## 2024-12-11 DIAGNOSIS — J111 Influenza due to unidentified influenza virus with other respiratory manifestations: Secondary | ICD-10-CM

## 2024-12-11 DIAGNOSIS — F339 Major depressive disorder, recurrent, unspecified: Secondary | ICD-10-CM

## 2024-12-11 DIAGNOSIS — I10 Essential (primary) hypertension: Secondary | ICD-10-CM | POA: Diagnosis not present

## 2024-12-11 MED ORDER — OSELTAMIVIR PHOSPHATE 75 MG PO CAPS: 75.0000 mg | ORAL_CAPSULE | Freq: Two times a day (BID) | ORAL | 0 refills | Status: AC

## 2024-12-11 MED ORDER — MELATONIN 3 MG PO CAPS: 1.0000 | ORAL_CAPSULE | Freq: Every evening | ORAL | 0 refills | Status: AC | PRN

## 2024-12-11 NOTE — Progress Notes (Signed)
" ° ° °  SUBJECTIVE:   CHIEF COMPLAINT / HPI:   Lisa Newton is a 53 YO Female who present at [facility] today with the following concerns  Flu symptoms Patient states she has been experiencing running nose, sinus pain, HA, productive cough w/ fatigue starting 2 days ago. She has not been able to sleep at night. Also states that she has been having some diarrhea since the symptoms starting. Patient denies fever, chills, chest pain, SOB  Elevated BP Her sBP was in 170s initially at the visit. Per patient she did not feel well from the respiratory symptoms so she didn't get to take her HTN medications this morning. She denies vision change, SOB, or palpitation  Depression PHQ-9 scored 22 during this visit. Patient do not has thought of hurting herself or the other. She has been taking Duloxetine  daily  PERTINENT  PMH / PSH:  MDD HTN Hypothyroidism   OBJECTIVE:   BP (!) 153/90   Pulse 88   Temp 99 F (37.2 C) (Oral)   Wt 219 lb 9.6 oz (99.6 kg)   SpO2 100%   BMI 35.44 kg/m   Physical Exam Constitutional:      Appearance: Normal appearance.  Cardiovascular:     Rate and Rhythm: Normal rate.     Pulses: Normal pulses.  Pulmonary:     Effort: Pulmonary effort is normal.     Breath sounds: Normal breath sounds.  Abdominal:     Palpations: Abdomen is soft.  Neurological:     Mental Status: She is alert.      ASSESSMENT/PLAN:   Assessment & Plan Flu Patient w/ flu symptoms in last 48 hrs - Will start her on Temiflu 75 mg BID for 5 days - OTC cough medication as needed - Provided Neti pot during the visit Episode of recurrent major depressive disorder, unspecified depression episode severity PHQ-9 scored 22. She has been on medication but states has been having poor sleep. I suspect her depression will be improved after her flu and get better sleep - Starting Melatonin 3 mg nightly Primary hypertension Elevated BP during the visit initial sBP 170s w/ 150's rechecked - f/u next  week for BP check    Houston Samuels, DO PGY 1 Family Medicine Resident  [facility] "

## 2024-12-11 NOTE — Assessment & Plan Note (Signed)
 PHQ-9 scored 22. She has been on medication but states has been having poor sleep. I suspect her depression will be improved after her flu and get better sleep - Starting Melatonin 3 mg nightly

## 2024-12-11 NOTE — Patient Instructions (Signed)
" ° °  It was great to see you!  Our plans for today:  - Take Oseltamivir( Tamiflu ) 75 mg twice daily for 5 days - Starting Melatonin 3 mg nightly - f/u next week for blood pressure check or sooner if flu symptoms is not improved in 1 week   Take care and seek immediate care sooner if you develop any concerns.       [name] HAS PGY 1 Family Medicine Resident [facility]  [address] [place], [place] [address] Fax [phone] Phone [phone] 12/11/2024, 10:08 AM  "

## 2024-12-17 ENCOUNTER — Telehealth: Payer: Self-pay

## 2024-12-17 NOTE — Telephone Encounter (Signed)
 Patient calls nurse line requesting cough medication.   She reports she was seen on 12/31 for flu like illness.   She reports she feels much better, however the cough has become too much. She reports she can hardly sleep.  She denies any shortness of breath.   Patient was audibly coughing during our phone conversation.   Advised will forward to provider who saw patient for concern.

## 2024-12-19 MED ORDER — BENZONATATE 100 MG PO CAPS: 100.0000 mg | ORAL_CAPSULE | Freq: Three times a day (TID) | ORAL | 0 refills | Status: AC | PRN

## 2024-12-19 NOTE — Telephone Encounter (Signed)
 Call patient.  Advised on recommendation for cough per resident.  Patient reports she would prefer I reach out to Dr. Anders.   She reports she had a dental apt scheduled, however they asked her to reschedule due to coughing spell. She reports she is not sleeping well at night.   She denies any worsening symptoms. No fevers or shortness of breath.   Advised will reach out to PCP.

## 2024-12-19 NOTE — Telephone Encounter (Signed)
 Sending Tessalone pearl to her pharm. Avoiding Promethazine  DM due to her being on SSRI which can be dangerous. Schedule follow up to see me soon if no improvement.

## 2024-12-19 NOTE — Telephone Encounter (Signed)
 Called patient and advised of update per Dr. Anders.   Patient will call back to schedule if she does not have improvement in her symptoms.   Lisa JAYSON English, RN

## 2025-01-01 ENCOUNTER — Encounter: Payer: Self-pay | Admitting: Pharmacist

## 2025-01-01 NOTE — Progress Notes (Signed)
 This patient is appearing on a report for being at risk of failing the adherence measure for hypertension (ACEi/ARB) medications this calendar year.   Medication: Losartan  POT TAB 100 mg Last fill date: 12/16/24 for 90 day supply  Reviewed medication indication, dosing, and goals of therapy.

## 2025-01-16 ENCOUNTER — Encounter: Payer: Self-pay | Admitting: Family Medicine

## 2025-01-17 ENCOUNTER — Encounter: Admitting: Family Medicine

## 2025-02-27 ENCOUNTER — Encounter
